# Patient Record
Sex: Male | Born: 1980
Health system: Southern US, Community
[De-identification: ages and names within clinical notes are randomized; demographics above are authoritative.]

## PROBLEM LIST (undated history)

## (undated) DIAGNOSIS — R569 Unspecified convulsions: Secondary | ICD-10-CM

## (undated) DIAGNOSIS — I8501 Esophageal varices with bleeding: Secondary | ICD-10-CM

## (undated) DIAGNOSIS — K7460 Unspecified cirrhosis of liver: Secondary | ICD-10-CM

## (undated) DIAGNOSIS — K219 Gastro-esophageal reflux disease without esophagitis: Secondary | ICD-10-CM

## (undated) DIAGNOSIS — F1010 Alcohol abuse, uncomplicated: Secondary | ICD-10-CM

## (undated) DIAGNOSIS — F32A Depression, unspecified: Secondary | ICD-10-CM

## (undated) DIAGNOSIS — F1910 Other psychoactive substance abuse, uncomplicated: Secondary | ICD-10-CM

## (undated) DIAGNOSIS — F419 Anxiety disorder, unspecified: Secondary | ICD-10-CM

## (undated) DIAGNOSIS — Z5189 Encounter for other specified aftercare: Secondary | ICD-10-CM

## (undated) HISTORY — DX: Anxiety disorder, unspecified: F41.9

## (undated) HISTORY — DX: Encounter for other specified aftercare: Z51.89

## (undated) HISTORY — DX: Other psychoactive substance abuse, uncomplicated: F19.10

## (undated) HISTORY — DX: Unspecified convulsions: R56.9

## (undated) HISTORY — DX: Depression, unspecified: F32.A

---

## 2011-09-16 ENCOUNTER — Other Ambulatory Visit: Payer: Self-pay | Admitting: Gastroenterology

## 2011-09-16 DIAGNOSIS — R1011 Right upper quadrant pain: Secondary | ICD-10-CM

## 2011-09-18 ENCOUNTER — Ambulatory Visit
Admission: RE | Admit: 2011-09-18 | Discharge: 2011-09-18 | Disposition: A | Discharge status: Home / Self Care | Payer: BC Managed Care – PPO | Source: Ambulatory Visit | Attending: Gastroenterology | Admitting: Gastroenterology

## 2011-09-18 DIAGNOSIS — R1011 Right upper quadrant pain: Secondary | ICD-10-CM

## 2011-09-18 MED ORDER — IOHEXOL 300 MG/ML  SOLN
100.0000 mL | Freq: Once | INTRAMUSCULAR | Status: AC | PRN
  Administered 2011-09-18: 100 mL via INTRAVENOUS

## 2012-09-17 ENCOUNTER — Encounter (HOSPITAL_COMMUNITY): Payer: Self-pay | Admitting: Radiology

## 2012-09-17 ENCOUNTER — Emergency Department (HOSPITAL_COMMUNITY)
Admission: EM | Admit: 2012-09-17 | Discharge: 2012-09-17 | Disposition: A | Discharge status: Home / Self Care | Payer: BC Managed Care – PPO | Attending: Emergency Medicine | Admitting: Emergency Medicine

## 2012-09-17 DIAGNOSIS — E876 Hypokalemia: Secondary | ICD-10-CM | POA: Insufficient documentation

## 2012-09-17 DIAGNOSIS — Z79899 Other long term (current) drug therapy: Secondary | ICD-10-CM | POA: Insufficient documentation

## 2012-09-17 DIAGNOSIS — K219 Gastro-esophageal reflux disease without esophagitis: Secondary | ICD-10-CM | POA: Insufficient documentation

## 2012-09-17 DIAGNOSIS — R5381 Other malaise: Secondary | ICD-10-CM | POA: Insufficient documentation

## 2012-09-17 DIAGNOSIS — K769 Liver disease, unspecified: Secondary | ICD-10-CM | POA: Insufficient documentation

## 2012-09-17 DIAGNOSIS — R209 Unspecified disturbances of skin sensation: Secondary | ICD-10-CM | POA: Insufficient documentation

## 2012-09-17 DIAGNOSIS — R110 Nausea: Secondary | ICD-10-CM | POA: Insufficient documentation

## 2012-09-17 HISTORY — DX: Gastro-esophageal reflux disease without esophagitis: K21.9

## 2012-09-17 LAB — COMPREHENSIVE METABOLIC PANEL
AST: 153 U/L — ABNORMAL HIGH (ref 0–37)
CO2: 24 mEq/L (ref 19–32)
Calcium: 6.2 mg/dL — CL (ref 8.4–10.5)
Creatinine, Ser: 0.52 mg/dL (ref 0.50–1.35)
GFR calc Af Amer: >90 mL/min (ref >90)
GFR calc non Af Amer: >90 mL/min (ref >90)
Glucose, Bld: 113 mg/dL — ABNORMAL HIGH (ref 70–99)
Total Protein: 7.5 g/dL (ref 6.0–8.3)

## 2012-09-17 LAB — CBC
HCT: 38.6 % — ABNORMAL LOW (ref 39.0–52.0)
MCHC: 37 g/dL — ABNORMAL HIGH (ref 30.0–36.0)
MCV: 90.2 fL (ref 78.0–100.0)
Platelets: 255 10*3/uL (ref 150–400)
RDW: 18.5 % — ABNORMAL HIGH (ref 11.5–15.5)

## 2012-09-17 LAB — POCT I-STAT, CHEM 8
HCT: 41 % (ref 39.0–52.0)
Hemoglobin: 13.9 g/dL (ref 13.0–17.0)
Potassium: 3.1 mEq/L — ABNORMAL LOW (ref 3.5–5.1)
Sodium: 141 mEq/L (ref 135–145)

## 2012-09-17 LAB — CALCIUM, IONIZED: Calcium, Ion: 0.74 mmol/L — ABNORMAL LOW (ref 1.12–1.23)

## 2012-09-17 MED ORDER — SODIUM CHLORIDE 0.9 % IV BOLUS (SEPSIS)
1000.0000 mL | Freq: Once | INTRAVENOUS | Status: AC
  Administered 2012-09-17: 1000 mL via INTRAVENOUS

## 2012-09-17 MED ORDER — POTASSIUM CHLORIDE CRYS ER 20 MEQ PO TBCR
40.0000 meq | EXTENDED_RELEASE_TABLET | Freq: Once | ORAL | Status: AC
  Administered 2012-09-17: 40 meq via ORAL
  Filled 2012-09-17: qty 2

## 2012-09-17 MED ORDER — MAGNESIUM SULFATE 40 MG/ML IJ SOLN
2.0000 g | INTRAMUSCULAR | Status: AC
  Administered 2012-09-17: 2 g via INTRAVENOUS
  Filled 2012-09-17: qty 50

## 2012-09-17 MED ORDER — SODIUM CHLORIDE 0.9 % IV SOLN
1.0000 g | Freq: Once | INTRAVENOUS | Status: AC
  Administered 2012-09-17: 1 g via INTRAVENOUS
  Filled 2012-09-17: qty 10

## 2012-09-17 NOTE — ED Notes (Signed)
Main lab called with critical magnesium 0.4. At this time. ED MD made aware

## 2012-09-17 NOTE — ED Notes (Signed)
Abnormal lab value called by lab. Calcium 6.2. Ed md made aware

## 2012-09-17 NOTE — ED Provider Notes (Signed)
History     CSN: [id number]  Arrival date & time 09/17/12  4098   First MD Initiated Contact with Patient 09/17/12 [phone]      Chief Complaint  Patient presents with  . Muscle Pain  . Abnormal Lab    HPI  Patient presents with concerns of muscle cramps, weakness. Symptoms began approximately one week ago, initially intermittent.  Since onset and hasn't become more persistent, though remained intermittent.  There is diffuse also weakness with crampiness of his upper and lower extremities. There is occasional.  Oral dysesthesia. With the progression of weakness the patient saw his physician yesterday. Labs were drawn, and today results are notable for abnormal calcium value. He was referred here for further evaluation. He denies any fever, chills, vomiting, diarrhea, incontinence, syncope throughout the episode. No new medication use. He takes lisinopril for blood pressure. He has a notable history of prior alcohol abuse, abnormal liver function.   Past Medical History  Diagnosis Date  . GERD (gastroesophageal reflux disease)     History reviewed. No pertinent past surgical history.  History reviewed. No pertinent family history.  History  Substance Use Topics  . Smoking status: Not on file  . Smokeless tobacco: Not on file  . Alcohol Use: Not on file      Review of Systems  Constitutional:       Per HPI, otherwise negative  HENT:       Per HPI, otherwise negative  Respiratory:       Per HPI, otherwise negative  Cardiovascular:       Per HPI, otherwise negative  Gastrointestinal: Positive for nausea. Negative for vomiting.  Endocrine:       Negative aside from HPI  Genitourinary:       Neg aside from HPI   Musculoskeletal:       Per HPI, otherwise negative  Skin: Negative.   Neurological: Positive for weakness and numbness. Negative for dizziness, syncope, light-headedness and headaches.    Allergies  Soy allergy  Home Medications   Current  Outpatient Rx  Name  Route  Sig  Dispense  Refill  . Multiple Vitamin (MULTIVITAMIN WITH MINERALS) TABS   Oral   Take 1 tablet by mouth daily.         Marland Kitchen omeprazole (PRILOSEC) 20 MG capsule   Oral   Take 20 mg by mouth daily.           BP 107/79  Pulse 107  Temp(Src) 98.8 F (37.1 C) (Oral)  Resp 20  SpO2 100%  Physical Exam  Nursing note and vitals reviewed. Constitutional: He is oriented to person, place, and time. He appears well-developed. No distress.  HENT:  Head: Normocephalic and atraumatic.  Neg [place]  Eyes: Conjunctivae and EOM are normal.  Cardiovascular: Normal rate and regular rhythm.   Pulmonary/Chest: Effort normal. No stridor. No respiratory distress.  Abdominal: He exhibits no distension.  Musculoskeletal: He exhibits no edema.  Strength in upper and lower extremities, proximally and distally is 5/5, the patient describes a subjective weakness. No gross tetany  Neurological: He is alert and oriented to person, place, and time.  Skin: Skin is warm and dry.  Psychiatric: He has a normal mood and affect.    ED Course  Procedures (including critical care time)  Labs Reviewed  CALCIUM, IONIZED  CBC  COMPREHENSIVE METABOLIC PANEL  MAGNESIUM   No results found.   No diagnosis found.  O2- 99%ra, normal   Date: 09/17/2012  Rate: 105  Rhythm: sinus tachycardia  QRS Axis: normal  Intervals: normal  ST/T Wave abnormalities: nonspecific T wave changes - some biphasic, some inverted  Conduction Disutrbances:none  Narrative Interpretation:   Old EKG Reviewed: none available  ABNORMAL   Initial labs notable for hypomagnesemia.  Supplementation ordered.  Secondary labs demonstrate hypocalcemia as well.  Ionized calcium is pending, but the patient received additional calcium supplement.   11:41 AM Patient subjectively better, stating that he feels notable improvement. On my reexam he appears more comfortable. We discussed his abnormal  labs, including his hyperbilirubinemia.  With his documented hepatic dysfunction we discussed follow up as an outpatient for this abnormality.  MDM  This young male with known hepatic dysfunction presents with new weakness.  No gross evidence of tetany, but with notable abnormal labs.  Following resuscitation with fluids, left leg supplementation, he was substantially improved.  Clear etiology for his electrolyte deficiencies not apparent, but with his improvement, has stable vital signs, is appropriate for discharge with return precautions, and instructions to have repeat labs done in 4 days as well as to discuss his ongoing hepatic dysfunction with his primary care physician.        Gerhard Munch, MD 09/17/12 [phone]

## 2012-09-17 NOTE — ED Notes (Signed)
Pt presents with abnormal lab values per PMD of calcium. Pt reports muscles cramps in all 4 extremities X 1 week.

## 2012-09-17 NOTE — ED Notes (Signed)
MD at bedside. 

## 2012-09-23 ENCOUNTER — Other Ambulatory Visit: Payer: Self-pay

## 2012-09-23 DIAGNOSIS — R748 Abnormal levels of other serum enzymes: Secondary | ICD-10-CM

## 2012-09-28 ENCOUNTER — Other Ambulatory Visit: Payer: BC Managed Care – PPO

## 2015-08-20 ENCOUNTER — Encounter (HOSPITAL_COMMUNITY): Payer: Self-pay | Admitting: Emergency Medicine

## 2015-08-20 ENCOUNTER — Inpatient Hospital Stay (HOSPITAL_COMMUNITY)
Admission: EM | Admit: 2015-08-20 | Discharge: 2015-09-08 | DRG: 368 | Disposition: A | Discharge status: Home Health | Payer: Medicaid Other | Attending: Internal Medicine | Admitting: Internal Medicine

## 2015-08-20 DIAGNOSIS — E870 Hyperosmolality and hypernatremia: Secondary | ICD-10-CM | POA: Diagnosis present

## 2015-08-20 DIAGNOSIS — E875 Hyperkalemia: Secondary | ICD-10-CM | POA: Diagnosis not present

## 2015-08-20 DIAGNOSIS — E861 Hypovolemia: Secondary | ICD-10-CM | POA: Diagnosis present

## 2015-08-20 DIAGNOSIS — D6959 Other secondary thrombocytopenia: Secondary | ICD-10-CM | POA: Diagnosis present

## 2015-08-20 DIAGNOSIS — F17210 Nicotine dependence, cigarettes, uncomplicated: Secondary | ICD-10-CM | POA: Diagnosis present

## 2015-08-20 DIAGNOSIS — G9340 Encephalopathy, unspecified: Secondary | ICD-10-CM

## 2015-08-20 DIAGNOSIS — E872 Acidosis, unspecified: Secondary | ICD-10-CM

## 2015-08-20 DIAGNOSIS — K922 Gastrointestinal hemorrhage, unspecified: Secondary | ICD-10-CM | POA: Diagnosis not present

## 2015-08-20 DIAGNOSIS — R339 Retention of urine, unspecified: Secondary | ICD-10-CM | POA: Diagnosis present

## 2015-08-20 DIAGNOSIS — E2740 Unspecified adrenocortical insufficiency: Secondary | ICD-10-CM | POA: Diagnosis present

## 2015-08-20 DIAGNOSIS — K7031 Alcoholic cirrhosis of liver with ascites: Secondary | ICD-10-CM | POA: Diagnosis present

## 2015-08-20 DIAGNOSIS — R791 Abnormal coagulation profile: Secondary | ICD-10-CM | POA: Diagnosis not present

## 2015-08-20 DIAGNOSIS — Z515 Encounter for palliative care: Secondary | ICD-10-CM | POA: Diagnosis not present

## 2015-08-20 DIAGNOSIS — J9600 Acute respiratory failure, unspecified whether with hypoxia or hypercapnia: Secondary | ICD-10-CM | POA: Diagnosis not present

## 2015-08-20 DIAGNOSIS — I8501 Esophageal varices with bleeding: Secondary | ICD-10-CM | POA: Diagnosis present

## 2015-08-20 DIAGNOSIS — F1010 Alcohol abuse, uncomplicated: Secondary | ICD-10-CM | POA: Diagnosis not present

## 2015-08-20 DIAGNOSIS — Z781 Physical restraint status: Secondary | ICD-10-CM | POA: Diagnosis not present

## 2015-08-20 DIAGNOSIS — R578 Other shock: Secondary | ICD-10-CM | POA: Insufficient documentation

## 2015-08-20 DIAGNOSIS — Z7189 Other specified counseling: Secondary | ICD-10-CM | POA: Diagnosis not present

## 2015-08-20 DIAGNOSIS — E46 Unspecified protein-calorie malnutrition: Secondary | ICD-10-CM | POA: Diagnosis present

## 2015-08-20 DIAGNOSIS — R188 Other ascites: Secondary | ICD-10-CM

## 2015-08-20 DIAGNOSIS — K652 Spontaneous bacterial peritonitis: Secondary | ICD-10-CM | POA: Diagnosis not present

## 2015-08-20 DIAGNOSIS — D689 Coagulation defect, unspecified: Secondary | ICD-10-CM | POA: Diagnosis present

## 2015-08-20 DIAGNOSIS — K766 Portal hypertension: Secondary | ICD-10-CM | POA: Diagnosis present

## 2015-08-20 DIAGNOSIS — D62 Acute posthemorrhagic anemia: Secondary | ICD-10-CM | POA: Diagnosis present

## 2015-08-20 DIAGNOSIS — F10239 Alcohol dependence with withdrawal, unspecified: Secondary | ICD-10-CM | POA: Diagnosis present

## 2015-08-20 DIAGNOSIS — Z66 Do not resuscitate: Secondary | ICD-10-CM | POA: Diagnosis present

## 2015-08-20 DIAGNOSIS — J9601 Acute respiratory failure with hypoxia: Secondary | ICD-10-CM | POA: Insufficient documentation

## 2015-08-20 DIAGNOSIS — R451 Restlessness and agitation: Secondary | ICD-10-CM | POA: Diagnosis not present

## 2015-08-20 DIAGNOSIS — K920 Hematemesis: Secondary | ICD-10-CM | POA: Diagnosis present

## 2015-08-20 DIAGNOSIS — Z4659 Encounter for fitting and adjustment of other gastrointestinal appliance and device: Secondary | ICD-10-CM

## 2015-08-20 DIAGNOSIS — E876 Hypokalemia: Secondary | ICD-10-CM | POA: Diagnosis present

## 2015-08-20 DIAGNOSIS — K7040 Alcoholic hepatic failure without coma: Secondary | ICD-10-CM | POA: Diagnosis present

## 2015-08-20 DIAGNOSIS — R140 Abdominal distension (gaseous): Secondary | ICD-10-CM

## 2015-08-20 DIAGNOSIS — D72829 Elevated white blood cell count, unspecified: Secondary | ICD-10-CM | POA: Diagnosis not present

## 2015-08-20 DIAGNOSIS — N179 Acute kidney failure, unspecified: Secondary | ICD-10-CM | POA: Diagnosis not present

## 2015-08-20 DIAGNOSIS — K567 Ileus, unspecified: Secondary | ICD-10-CM

## 2015-08-20 DIAGNOSIS — Z6827 Body mass index (BMI) 27.0-27.9, adult: Secondary | ICD-10-CM

## 2015-08-20 DIAGNOSIS — E8770 Fluid overload, unspecified: Secondary | ICD-10-CM | POA: Diagnosis not present

## 2015-08-20 DIAGNOSIS — K7030 Alcoholic cirrhosis of liver without ascites: Secondary | ICD-10-CM | POA: Insufficient documentation

## 2015-08-20 DIAGNOSIS — K219 Gastro-esophageal reflux disease without esophagitis: Secondary | ICD-10-CM | POA: Diagnosis present

## 2015-08-20 DIAGNOSIS — E8809 Other disorders of plasma-protein metabolism, not elsewhere classified: Secondary | ICD-10-CM | POA: Diagnosis present

## 2015-08-20 DIAGNOSIS — Z0189 Encounter for other specified special examinations: Secondary | ICD-10-CM

## 2015-08-20 DIAGNOSIS — R9431 Abnormal electrocardiogram [ECG] [EKG]: Secondary | ICD-10-CM | POA: Diagnosis not present

## 2015-08-20 DIAGNOSIS — Z8249 Family history of ischemic heart disease and other diseases of the circulatory system: Secondary | ICD-10-CM | POA: Diagnosis not present

## 2015-08-20 DIAGNOSIS — J9690 Respiratory failure, unspecified, unspecified whether with hypoxia or hypercapnia: Secondary | ICD-10-CM

## 2015-08-20 HISTORY — DX: Alcohol abuse, uncomplicated: F10.10

## 2015-08-20 LAB — COMPREHENSIVE METABOLIC PANEL
ALT: 24 U/L (ref 17–63)
AST: 73 U/L — ABNORMAL HIGH (ref 15–41)
Albumin: 2.2 g/dL — ABNORMAL LOW (ref 3.5–5.0)
Alkaline Phosphatase: 86 U/L (ref 38–126)
Anion gap: 7 (ref 5–15)
BUN: <5 mg/dL — ABNORMAL LOW (ref 6–20)
CHLORIDE: 106 mmol/L (ref 101–111)
CO2: 22 mmol/L (ref 22–32)
Calcium: 6.5 mg/dL — ABNORMAL LOW (ref 8.9–10.3)
Creatinine, Ser: 0.72 mg/dL (ref 0.61–1.24)
Glucose, Bld: 111 mg/dL — ABNORMAL HIGH (ref 65–99)
POTASSIUM: 2.7 mmol/L — AB (ref 3.5–5.1)
Sodium: 135 mmol/L (ref 135–145)
Total Bilirubin: 1.4 mg/dL — ABNORMAL HIGH (ref 0.3–1.2)
Total Protein: 6.7 g/dL (ref 6.5–8.1)

## 2015-08-20 LAB — CBC WITH DIFFERENTIAL/PLATELET
BASOS ABS: 0.1 10*3/uL (ref 0.0–0.1)
BASOS PCT: 1 %
Eosinophils Absolute: 0.1 10*3/uL (ref 0.0–0.7)
Eosinophils Relative: 1 %
HEMATOCRIT: 23.3 % — AB (ref 39.0–52.0)
Hemoglobin: 6.7 g/dL — CL (ref 13.0–17.0)
LYMPHS ABS: 1 10*3/uL (ref 0.7–4.0)
Lymphocytes Relative: 11 %
MCH: 20 pg — AB (ref 26.0–34.0)
MCHC: 28.8 g/dL — AB (ref 30.0–36.0)
MCV: 69.6 fL — ABNORMAL LOW (ref 78.0–100.0)
MONOS PCT: 16 %
Monocytes Absolute: 1.5 10*3/uL — ABNORMAL HIGH (ref 0.1–1.0)
NEUTROS ABS: 6.8 10*3/uL (ref 1.7–7.7)
Neutrophils Relative %: 71 %
Platelets: 206 10*3/uL (ref 150–400)
RBC: 3.35 MIL/uL — ABNORMAL LOW (ref 4.22–5.81)
RDW: 20.4 % — AB (ref 11.5–15.5)
WBC: 9.5 10*3/uL (ref 4.0–10.5)

## 2015-08-20 LAB — POC OCCULT BLOOD, ED: FECAL OCCULT BLD: POSITIVE — AB

## 2015-08-20 LAB — PREPARE RBC (CROSSMATCH)

## 2015-08-20 LAB — PROTIME-INR
INR: 1.56 — AB (ref 0.00–1.49)
PROTHROMBIN TIME: 18.7 s — AB (ref 11.6–15.2)

## 2015-08-20 LAB — I-STAT CG4 LACTIC ACID, ED: Lactic Acid, Venous: 4.82 mmol/L (ref 0.5–2.0)

## 2015-08-20 LAB — ETHANOL: Alcohol, Ethyl (B): 50 mg/dL — ABNORMAL HIGH (ref <5)

## 2015-08-20 LAB — APTT: aPTT: 29 seconds (ref 24–37)

## 2015-08-20 LAB — ABO/RH: ABO/RH(D): O NEG

## 2015-08-20 MED ORDER — SODIUM CHLORIDE 0.9 % IV BOLUS (SEPSIS)
1000.0000 mL | Freq: Once | INTRAVENOUS | Status: AC
  Administered 2015-08-20: 1000 mL via INTRAVENOUS

## 2015-08-20 MED ORDER — POTASSIUM CHLORIDE 10 MEQ/100ML IV SOLN
10.0000 meq | INTRAVENOUS | Status: AC
  Administered 2015-08-20 – 2015-08-21 (×2): 10 meq via INTRAVENOUS
  Filled 2015-08-20 (×2): qty 100

## 2015-08-20 MED ORDER — OCTREOTIDE LOAD VIA INFUSION
50.0000 ug | Freq: Once | INTRAVENOUS | Status: AC
  Administered 2015-08-20: 50 ug via INTRAVENOUS
  Filled 2015-08-20: qty 25

## 2015-08-20 MED ORDER — SODIUM CHLORIDE 0.9 % IV SOLN
Freq: Once | INTRAVENOUS | Status: AC
  Administered 2015-08-21: 02:00:00 via INTRAVENOUS

## 2015-08-20 MED ORDER — LACTATED RINGERS IV BOLUS (SEPSIS): 1000.0000 mL | Freq: Once | INTRAVENOUS | Status: DC

## 2015-08-20 MED ORDER — LACTATED RINGERS IV BOLUS (SEPSIS)
1000.0000 mL | Freq: Once | INTRAVENOUS | Status: AC
  Administered 2015-08-20: 1000 mL via INTRAVENOUS

## 2015-08-20 MED ORDER — SODIUM CHLORIDE 0.9 % IV SOLN
50.0000 ug/h | INTRAVENOUS | Status: DC
  Administered 2015-08-20 – 2015-08-23 (×6): 50 ug/h via INTRAVENOUS
  Filled 2015-08-20 (×18): qty 1

## 2015-08-20 MED ORDER — DEXTROSE 5 % IV SOLN
1.0000 g | Freq: Once | INTRAVENOUS | Status: AC
  Administered 2015-08-20: 1 g via INTRAVENOUS
  Filled 2015-08-20: qty 10

## 2015-08-20 MED ORDER — SODIUM CHLORIDE 0.9 % IV SOLN: 10.0000 mL/h | Freq: Once | INTRAVENOUS | Status: DC

## 2015-08-20 MED ORDER — PANTOPRAZOLE SODIUM 40 MG IV SOLR
40.0000 mg | Freq: Once | INTRAVENOUS | Status: AC
  Administered 2015-08-20: 40 mg via INTRAVENOUS
  Filled 2015-08-20: qty 40

## 2015-08-20 NOTE — Consult Note (Signed)
[facility] Consult Note  Referring Provider: No ref. provider found Primary Care Physician:  No primary care provider on file. Primary Gastroenterologist:  Dr.  Chief Complaint: Hematemesis HPI: [name] is an 35 y.o. white male  who presented with the abrupt onset of  hematemesis this evening with orthostatic dizziness and also noticed a bloody stool. Had a previous normal bowel movement 2 hours earlier. He denies any pain. He is alert and oriented and has not vomited since reaching the emergency room. Hemoglobin 6.7 lactic acid 4.8 with an INR of 1.9. Patient has history of fatty liver disease and alcohol use with no known cirrhosis. He had an EGD showing a small hiatal hernia in 2013. He states he empirically takes nonsteroidal anti-inflammatory drugs and does drink alcohol daily.  Past Medical History  Diagnosis Date  . GERD (gastroesophageal reflux disease)   . ETOH abuse     History reviewed. No pertinent past surgical history.   (Not in a hospital admission)  Allergies:  Allergies  Allergen Reactions  . Soy Allergy Other (See Comments)    Swelling.     History reviewed. No pertinent family history.  Social History:  reports that he has been smoking Cigarettes.  He has been smoking about 1.00 pack per day. He has never used smokeless tobacco. He reports that he drinks alcohol. He reports that he does not use illicit drugs.  Review of Systems: negative except As above   Blood pressure 133/85, pulse 138, temperature 98.9 F (37.2 C), temperature source Oral, resp. rate 16, height 5' 7" (1.702 m), weight 58.968 kg (130 lb), SpO2 100 %. Head: Normocephalic, without obvious abnormality, atraumatic Neck: no adenopathy, no carotid bruit, no JVD, supple, symmetrical, trachea midline and thyroid not enlarged, symmetric, no tenderness/mass/nodules Resp: clear to auscultation bilaterally Cardio: regular rate and rhythm, S1, S2 normal, no murmur, click, rub or  gallop GI: Abdomen soft nondistended with normoactive bowel sounds. No hepatosplenomegaly mass or guarding. Extremities: extremities normal, atraumatic, no cyanosis or edema  Results for orders placed or performed during the hospital encounter of 08/20/15 (from the past 48 hour(s))  POC occult blood, ED     Status: Abnormal   Collection Time: 08/20/15  9:37 PM  Result Value Ref Range   Fecal Occult Bld POSITIVE (A) NEGATIVE  Type and screen [facility]     Status: None (Preliminary result)   Collection Time: 08/20/15  9:42 PM  Result Value Ref Range   ABO/RH(D) O NEG    Antibody Screen PENDING    Sample Expiration 08/23/2015   Comprehensive metabolic panel     Status: Abnormal   Collection Time: 08/20/15  9:44 PM  Result Value Ref Range   Sodium 135 135 - 145 mmol/L   Potassium 2.7 (LL) 3.5 - 5.1 mmol/L    Comment: CRITICAL RESULT CALLED TO, READ BACK BY AND VERIFIED WITH: A.LEDWELL,RN AT 2220 ON 08/20/15 BY W.SHEA    Chloride 106 101 - 111 mmol/L   CO2 22 22 - 32 mmol/L   Glucose, Bld 111 (H) 65 - 99 mg/dL   BUN <5 (L) 6 - 20 mg/dL   Creatinine, Ser 0.72 0.61 - 1.24 mg/dL   Calcium 6.5 (L) 8.9 - 10.3 mg/dL   Total Protein 6.7 6.5 - 8.1 g/dL   Albumin 2.2 (L) 3.5 - 5.0 g/dL   AST 73 (H) 15 - 41 U/L   ALT 24 17 - 63 U/L   Alkaline Phosphatase 86 38 -   126 U/L   Total Bilirubin 1.4 (H) 0.3 - 1.2 mg/dL   GFR calc non Af Amer >60 >60 mL/min   GFR calc Af Amer >60 >60 mL/min    Comment: (NOTE) The eGFR has been calculated using the CKD EPI equation. This calculation has not been validated in all clinical situations. eGFR's persistently <60 mL/min signify possible Chronic Kidney Disease.    Anion gap 7 5 - 15  CBC with Differential/Platelet     Status: Abnormal   Collection Time: 08/20/15  9:44 PM  Result Value Ref Range   WBC 9.5 4.0 - 10.5 K/uL   RBC 3.35 (L) 4.22 - 5.81 MIL/uL   Hemoglobin 6.7 (LL) 13.0 - 17.0 g/dL    Comment: CRITICAL RESULT CALLED TO,  READ BACK BY AND VERIFIED WITH: INFINGER,K RN [id number] COV[place],N    HCT 23.3 (L) 39.0 - 52.0 %   MCV 69.6 (L) 78.0 - 100.0 fL   MCH 20.0 (L) 26.0 - 34.0 pg   MCHC 28.8 (L) 30.0 - 36.0 g/dL   RDW 20.4 (H) 11.5 - 15.5 %   Platelets 206 150 - 400 K/uL   Neutrophils Relative % 71 %   Lymphocytes Relative 11 %   Monocytes Relative 16 %   Eosinophils Relative 1 %   Basophils Relative 1 %   Neutro Abs 6.8 1.7 - 7.7 K/uL   Lymphs Abs 1.0 0.7 - 4.0 K/uL   Monocytes Absolute 1.5 (H) 0.1 - 1.0 K/uL   Eosinophils Absolute 0.1 0.0 - 0.7 K/uL   Basophils Absolute 0.1 0.0 - 0.1 K/uL   RBC Morphology POLYCHROMASIA PRESENT   APTT     Status: None   Collection Time: 08/20/15  9:44 PM  Result Value Ref Range   aPTT 29 24 - 37 seconds  Protime-INR     Status: Abnormal   Collection Time: 08/20/15  9:44 PM  Result Value Ref Range   Prothrombin Time 18.7 (H) 11.6 - 15.2 seconds   INR 1.56 (H) 0.00 - 1.49  I-Stat CG4 Lactic Acid, ED     Status: Abnormal   Collection Time: 08/20/15 10:07 PM  Result Value Ref Range   Lactic Acid, Venous 4.82 (HH) 0.5 - 2.0 mmol/L   Comment NOTIFIED PHYSICIAN   Ethanol     Status: Abnormal   Collection Time: 08/20/15 10:21 PM  Result Value Ref Range   Alcohol, Ethyl (B) 50 (H) <5 mg/dL    Comment:        LOWEST DETECTABLE LIMIT FOR SERUM ALCOHOL IS 5 mg/dL FOR MEDICAL PURPOSES ONLY    No results found.  Assessment: Hematemesis with melena in a patient with history of alcohol use. Plan:  IV PPI Transfuse PRBCs IV octreotide EGD early in the morning. Latreece Mochizuki C 08/20/2015, 11:20 PM  Pager [phone] If no answer or after 5 PM call [phone]   

## 2015-08-20 NOTE — ED Notes (Signed)
Pt presents via EMS from home c/o 2 episodes bright red emesis and one bright red bloody stool since 845pm tonight.  Prior hx ETOH abuse, 1 Four Loco ingested tonight. No other medical history. No pain.

## 2015-08-20 NOTE — ED Notes (Signed)
Critical lactic acid given to Dr.Knott

## 2015-08-20 NOTE — ED Provider Notes (Signed)
CSN: [id number]     Arrival date & time 08/20/15  2120 History   First MD Initiated Contact with Patient 08/20/15 2144     Chief Complaint  Patient presents with  . Hematemesis     (Consider location/radiation/quality/duration/timing/severity/associated sxs/prior Treatment) Patient is a 35 y.o. male presenting with GI illness. The history is provided by the patient.  GI Problem This is a new problem. The current episode started 1 to 2 hours ago. The problem occurs constantly. The problem has been rapidly improving. Pertinent negatives include no chest pain, no abdominal pain and no shortness of breath. Nothing aggravates the symptoms. He has tried nothing for the symptoms.    Past Medical History  Diagnosis Date  . GERD (gastroesophageal reflux disease)   . ETOH abuse    History reviewed. No pertinent past surgical history. History reviewed. No pertinent family history. Social History  Substance Use Topics  . Smoking status: Heavy Tobacco Smoker -- 1.00 packs/day    Types: Cigarettes  . Smokeless tobacco: Never Used  . Alcohol Use: Yes     Comment: 2 Four loco per day    Review of Systems  Respiratory: Negative for shortness of breath.   Cardiovascular: Negative for chest pain.  Gastrointestinal: Positive for vomiting and blood in stool. Negative for abdominal pain.       Hematemesis large volume x2  Neurological: Positive for dizziness and light-headedness.  All other systems reviewed and are negative.     Allergies  Soy allergy  Home Medications   Prior to Admission medications   Medication Sig Start Date End Date Taking? Authorizing Provider  omeprazole (PRILOSEC) 20 MG capsule Take 20 mg by mouth daily.   Yes Historical Provider, MD   BP 133/85 mmHg  Pulse 138  Temp(Src) 98.9 F (37.2 C) (Oral)  Resp 16  Ht 5\' 7"  (1.702 m)  Wt 130 lb (58.968 kg)  BMI 20.36 kg/m2  SpO2 100% Physical Exam  Constitutional: He is oriented to person, place, and time. He  appears well-developed. He appears listless. He appears toxic. No distress.  HENT:  Head: Normocephalic and atraumatic.  Eyes: Conjunctivae are normal.  Neck: Neck supple. No tracheal deviation present.  Cardiovascular: Regular rhythm and normal heart sounds.  Tachycardia present.   Pulmonary/Chest: Effort normal and breath sounds normal. No respiratory distress.  Abdominal: Soft. He exhibits no distension.  Neurological: He is oriented to person, place, and time. He appears listless.  Skin: Skin is warm and dry. There is pallor.  Psychiatric: He has a normal mood and affect.    ED Course  Procedures (including critical care time)  CRITICAL CARE Performed by: Lyndal PulleyKnott, Jobe Total critical care time: 30 minutes Critical care time was exclusive of separately billable procedures and treating other patients. Critical care was necessary to treat or prevent imminent or life-threatening deterioration. Critical care was time spent personally by me on the following activities: development of treatment plan with patient and/or surrogate as well as nursing, discussions with consultants, evaluation of patient's response to treatment, examination of patient, obtaining history from patient or surrogate, ordering and performing treatments and interventions, ordering and review of laboratory studies, ordering and review of radiographic studies, pulse oximetry and re-evaluation of patient's condition.   Labs Review Labs Reviewed  COMPREHENSIVE METABOLIC PANEL - Abnormal; Notable for the following:    Potassium 2.7 (*)    Glucose, Bld 111 (*)    BUN <5 (*)    Calcium 6.5 (*)    Albumin  2.2 (*)    AST 73 (*)    Total Bilirubin 1.4 (*)    All other components within normal limits  CBC WITH DIFFERENTIAL/PLATELET - Abnormal; Notable for the following:    RBC 3.35 (*)    Hemoglobin 6.7 (*)    HCT 23.3 (*)    MCV 69.6 (*)    MCH 20.0 (*)    MCHC 28.8 (*)    RDW 20.4 (*)    Monocytes Absolute 1.5  (*)    All other components within normal limits  ETHANOL - Abnormal; Notable for the following:    Alcohol, Ethyl (B) 50 (*)    All other components within normal limits  PROTIME-INR - Abnormal; Notable for the following:    Prothrombin Time 18.7 (*)    INR 1.56 (*)    All other components within normal limits  MAGNESIUM - Abnormal; Notable for the following:    Magnesium 0.4 (*)    All other components within normal limits  LACTIC ACID, PLASMA - Abnormal; Notable for the following:    Lactic Acid, Venous 2.3 (*)    All other components within normal limits  POC OCCULT BLOOD, ED - Abnormal; Notable for the following:    Fecal Occult Bld POSITIVE (*)    All other components within normal limits  I-STAT CG4 LACTIC ACID, ED - Abnormal; Notable for the following:    Lactic Acid, Venous 4.82 (*)    All other components within normal limits  CULTURE, BLOOD (ROUTINE X 2)  CULTURE, BLOOD (ROUTINE X 2)  MRSA PCR SCREENING  APTT  PHOSPHORUS  COMPREHENSIVE METABOLIC PANEL  LACTIC ACID, PLASMA  TYPE AND SCREEN  PREPARE RBC (CROSSMATCH)  ABO/RH    Imaging Review No results found. I have personally reviewed and evaluated these images and lab results as part of my medical decision-making.   EKG Interpretation   Date/Time:  Monday August 20 2015 21:29:52 EDT Ventricular Rate:  132 PR Interval:  124 QRS Duration: 77 QT Interval:  315 QTC Calculation: 467 R Axis:   -9 Text Interpretation:  Sinus tachycardia Borderline repolarization  abnormality No previous tracing Confirmed by Isidora Laham MD, [place] ([id number]) on  08/20/2015 11:13:43 PM      MDM   Final diagnoses:  Acute upper GI bleed  Hemorrhagic shock  Lactic acidosis  Acute blood loss anemia    35 y.o. male presents with acute GI bleeding episodes with bright red hematemesis tonight x2. This was followed by a bright red bowel movement and the patient became faint and lightheaded. H/o alcohol abuse and "fatty liver". Had one  episode of hematemesis previously remotely that resolved and never sought care for this. No known varices but highly suspicious for this with rapid transit upper GI bleed and clinical history. Pt is tachycardic and pale appearing, no bleeding episodes in ED. Fluid resuscitated pending labs and started on protonix and octreotide with rocephin for prophylaxis of translocation. Hb is low, will transfuse 2u PRBC in anticipation of potential further bleeding. mentating and maintaining airway appropriately. Lactic acidosis likely 2/2 acute bleeding episode. 2 runs of IV K for hypokalemia. GI consulted and agreed to see Pt in ED for emergent endoscopy. Hospitalist was consulted for stepdown admission and will see the patient in the emergency department.     Lyndal Pulley, MD 08/21/15 [phone]

## 2015-08-21 ENCOUNTER — Inpatient Hospital Stay (HOSPITAL_COMMUNITY): Payer: Medicaid Other

## 2015-08-21 ENCOUNTER — Inpatient Hospital Stay (HOSPITAL_COMMUNITY): Payer: Medicaid Other | Admitting: Anesthesiology

## 2015-08-21 ENCOUNTER — Encounter (HOSPITAL_COMMUNITY)
Admission: EM | Disposition: A | Discharge status: Home Health | Payer: Self-pay | Source: Home / Self Care | Attending: Emergency Medicine

## 2015-08-21 ENCOUNTER — Encounter (HOSPITAL_COMMUNITY): Payer: Self-pay | Admitting: *Deleted

## 2015-08-21 DIAGNOSIS — K922 Gastrointestinal hemorrhage, unspecified: Secondary | ICD-10-CM

## 2015-08-21 DIAGNOSIS — J9600 Acute respiratory failure, unspecified whether with hypoxia or hypercapnia: Secondary | ICD-10-CM

## 2015-08-21 DIAGNOSIS — F1010 Alcohol abuse, uncomplicated: Secondary | ICD-10-CM

## 2015-08-21 DIAGNOSIS — D62 Acute posthemorrhagic anemia: Secondary | ICD-10-CM

## 2015-08-21 DIAGNOSIS — R791 Abnormal coagulation profile: Secondary | ICD-10-CM

## 2015-08-21 HISTORY — PX: ESOPHAGOGASTRODUODENOSCOPY: SHX5428

## 2015-08-21 LAB — CBC
HCT: 24.2 % — ABNORMAL LOW (ref 39.0–52.0)
HCT: 24.3 % — ABNORMAL LOW (ref 39.0–52.0)
HEMATOCRIT: 27.9 % — AB (ref 39.0–52.0)
HEMATOCRIT: 24.9 % — AB (ref 39.0–52.0)
HEMOGLOBIN: 8 g/dL — AB (ref 13.0–17.0)
Hemoglobin: 7.9 g/dL — ABNORMAL LOW (ref 13.0–17.0)
Hemoglobin: 7.9 g/dL — ABNORMAL LOW (ref 13.0–17.0)
Hemoglobin: 8.8 g/dL — ABNORMAL LOW (ref 13.0–17.0)
MCH: 24.4 pg — AB (ref 26.0–34.0)
MCH: 24.7 pg — AB (ref 26.0–34.0)
MCH: 25.2 pg — AB (ref 26.0–34.0)
MCH: 25.6 pg — ABNORMAL LOW (ref 26.0–34.0)
MCHC: 32.1 g/dL (ref 30.0–36.0)
MCHC: 32.5 g/dL (ref 30.0–36.0)
MCHC: 32.6 g/dL (ref 30.0–36.0)
MCHC: 31.5 g/dL (ref 30.0–36.0)
MCV: 76.9 fL — ABNORMAL LOW (ref 78.0–100.0)
MCV: 77.1 fL — AB (ref 78.0–100.0)
MCV: 77.3 fL — AB (ref 78.0–100.0)
MCV: 78.9 fL (ref 78.0–100.0)
PLATELETS: 323 10*3/uL (ref 150–400)
PLATELETS: 330 10*3/uL (ref 150–400)
PLATELETS: 378 10*3/uL (ref 150–400)
Platelets: 295 10*3/uL (ref 150–400)
RBC: 3.08 MIL/uL — ABNORMAL LOW (ref 4.22–5.81)
RBC: 3.14 MIL/uL — ABNORMAL LOW (ref 4.22–5.81)
RBC: 3.24 MIL/uL — ABNORMAL LOW (ref 4.22–5.81)
RBC: 3.61 MIL/uL — AB (ref 4.22–5.81)
RDW: 19.2 % — AB (ref 11.5–15.5)
RDW: 19.2 % — AB (ref 11.5–15.5)
RDW: 20 % — AB (ref 11.5–15.5)
RDW: 19.3 % — ABNORMAL HIGH (ref 11.5–15.5)
WBC: 20.8 10*3/uL — AB (ref 4.0–10.5)
WBC: 39.6 10*3/uL — AB (ref 4.0–10.5)
WBC: 19 10*3/uL — ABNORMAL HIGH (ref 4.0–10.5)
WBC: 28.5 10*3/uL — ABNORMAL HIGH (ref 4.0–10.5)

## 2015-08-21 LAB — LACTIC ACID, PLASMA
LACTIC ACID, VENOUS: 2.3 mmol/L — AB (ref 0.5–2.0)
Lactic Acid, Venous: 2.2 mmol/L (ref 0.5–2.0)

## 2015-08-21 LAB — MAGNESIUM
MAGNESIUM: 0.4 mg/dL — AB (ref 1.7–2.4)
MAGNESIUM: 1.6 mg/dL — AB (ref 1.7–2.4)

## 2015-08-21 LAB — COMPREHENSIVE METABOLIC PANEL
ALT: 18 U/L (ref 17–63)
ANION GAP: 9 (ref 5–15)
AST: 49 U/L — AB (ref 15–41)
Albumin: 1.8 g/dL — ABNORMAL LOW (ref 3.5–5.0)
Alkaline Phosphatase: 64 U/L (ref 38–126)
BILIRUBIN TOTAL: 2 mg/dL — AB (ref 0.3–1.2)
CALCIUM: 6.3 mg/dL — AB (ref 8.9–10.3)
CO2: 21 mmol/L — ABNORMAL LOW (ref 22–32)
Chloride: 110 mmol/L (ref 101–111)
Creatinine, Ser: 0.65 mg/dL (ref 0.61–1.24)
GFR calc Af Amer: >60 mL/min (ref >60)
Glucose, Bld: 144 mg/dL — ABNORMAL HIGH (ref 65–99)
POTASSIUM: 4.4 mmol/L (ref 3.5–5.1)
Sodium: 140 mmol/L (ref 135–145)
TOTAL PROTEIN: 5.4 g/dL — AB (ref 6.5–8.1)

## 2015-08-21 LAB — PREPARE RBC (CROSSMATCH)

## 2015-08-21 LAB — MRSA PCR SCREENING: MRSA BY PCR: NEGATIVE

## 2015-08-21 LAB — PHOSPHORUS: Phosphorus: 2.7 mg/dL (ref 2.5–4.6)

## 2015-08-21 SURGERY — EGD (ESOPHAGOGASTRODUODENOSCOPY)
Anesthesia: Moderate Sedation | Laterality: Left

## 2015-08-21 MED ORDER — MIDAZOLAM HCL 2 MG/2ML IJ SOLN
2.0000 mg | INTRAMUSCULAR | Status: DC | PRN
  Administered 2015-08-21 (×2): 2 mg via INTRAVENOUS
  Filled 2015-08-21 (×2): qty 2

## 2015-08-21 MED ORDER — FENTANYL CITRATE (PF) 100 MCG/2ML IJ SOLN
INTRAMUSCULAR | Status: AC
  Filled 2015-08-21: qty 4

## 2015-08-21 MED ORDER — ETOMIDATE 2 MG/ML IV SOLN
INTRAVENOUS | Status: AC
  Filled 2015-08-21: qty 10

## 2015-08-21 MED ORDER — SODIUM CHLORIDE 0.9 % IV BOLUS (SEPSIS)
500.0000 mL | Freq: Once | INTRAVENOUS | Status: AC
  Administered 2015-08-21: 500 mL via INTRAVENOUS

## 2015-08-21 MED ORDER — SODIUM CHLORIDE 0.9 % IV SOLN
1.0000 g | Freq: Once | INTRAVENOUS | Status: AC
  Administered 2015-08-21: 1 g via INTRAVENOUS
  Filled 2015-08-21: qty 10

## 2015-08-21 MED ORDER — ETOMIDATE 2 MG/ML IV SOLN
0.3000 mg/kg | Freq: Once | INTRAVENOUS | Status: AC
  Administered 2015-08-21: 20 mg via INTRAVENOUS

## 2015-08-21 MED ORDER — THIAMINE HCL 100 MG/ML IJ SOLN
100.0000 mg | Freq: Every day | INTRAMUSCULAR | Status: DC
  Administered 2015-08-21 – 2015-08-30 (×10): 100 mg via INTRAVENOUS
  Filled 2015-08-21 (×10): qty 2

## 2015-08-21 MED ORDER — FOLIC ACID 5 MG/ML IJ SOLN
1.0000 mg | Freq: Every day | INTRAMUSCULAR | Status: DC
  Administered 2015-08-21 – 2015-08-30 (×10): 1 mg via INTRAVENOUS
  Filled 2015-08-21 (×12): qty 0.2

## 2015-08-21 MED ORDER — CHLORHEXIDINE GLUCONATE 0.12% ORAL RINSE (MEDLINE KIT)
15.0000 mL | Freq: Two times a day (BID) | OROMUCOSAL | Status: DC
  Administered 2015-08-21 – 2015-09-07 (×31): 15 mL via OROMUCOSAL
  Filled 2015-08-21 (×2): qty 15

## 2015-08-21 MED ORDER — FENTANYL CITRATE (PF) 100 MCG/2ML IJ SOLN: 100.0000 ug | INTRAMUSCULAR | Status: DC | PRN

## 2015-08-21 MED ORDER — DEXTROSE 5 % IV SOLN: 1.0000 g | INTRAVENOUS | Status: DC

## 2015-08-21 MED ORDER — VECURONIUM BROMIDE 10 MG IV SOLR
INTRAVENOUS | Status: AC
  Administered 2015-08-21: 8 mg
  Filled 2015-08-21: qty 10

## 2015-08-21 MED ORDER — MIDAZOLAM HCL 5 MG/ML IJ SOLN
INTRAMUSCULAR | Status: AC
  Filled 2015-08-21: qty 3

## 2015-08-21 MED ORDER — MIDAZOLAM HCL 2 MG/2ML IJ SOLN
INTRAMUSCULAR | Status: AC
  Filled 2015-08-21: qty 2

## 2015-08-21 MED ORDER — SUCCINYLCHOLINE CHLORIDE 20 MG/ML IJ SOLN
INTRAMUSCULAR | Status: DC | PRN
  Administered 2015-08-21: 100 mg via INTRAVENOUS

## 2015-08-21 MED ORDER — MAGNESIUM SULFATE IN D5W 10-5 MG/ML-% IV SOLN
1.0000 g | Freq: Once | INTRAVENOUS | Status: DC
  Filled 2015-08-21: qty 100

## 2015-08-21 MED ORDER — SODIUM CHLORIDE 0.9 % IV SOLN
Freq: Once | INTRAVENOUS | Status: AC
  Administered 2015-08-21: 05:00:00 via INTRAVENOUS

## 2015-08-21 MED ORDER — ETHANOLAMINE OLEATE 5 % IV SOLN
INTRAVENOUS | Status: DC | PRN
  Administered 2015-08-21: 4 mL via SUBMUCOSAL

## 2015-08-21 MED ORDER — FENTANYL CITRATE (PF) 100 MCG/2ML IJ SOLN
100.0000 ug | INTRAMUSCULAR | Status: DC | PRN
  Administered 2015-08-21 (×2): 100 ug via INTRAVENOUS
  Filled 2015-08-21: qty 2

## 2015-08-21 MED ORDER — ETOMIDATE 2 MG/ML IV SOLN
INTRAVENOUS | Status: DC | PRN
  Administered 2015-08-21: 14 mg via INTRAVENOUS

## 2015-08-21 MED ORDER — LORAZEPAM 2 MG/ML IJ SOLN
2.0000 mg | INTRAMUSCULAR | Status: DC | PRN
  Administered 2015-08-21: 1 mg via INTRAVENOUS
  Administered 2015-08-21 – 2015-08-22 (×2): 2 mg via INTRAVENOUS
  Filled 2015-08-21 (×3): qty 1

## 2015-08-21 MED ORDER — MORPHINE SULFATE (PF) 2 MG/ML IV SOLN
2.0000 mg | Freq: Once | INTRAVENOUS | Status: AC
  Administered 2015-08-21: 2 mg via INTRAVENOUS
  Filled 2015-08-21: qty 1

## 2015-08-21 MED ORDER — SODIUM CHLORIDE 0.9 % IV SOLN
8.0000 mg/h | INTRAVENOUS | Status: DC
  Administered 2015-08-21 – 2015-08-24 (×6): 8 mg/h via INTRAVENOUS
  Filled 2015-08-21 (×13): qty 80

## 2015-08-21 MED ORDER — ANTISEPTIC ORAL RINSE SOLUTION (CORINZ)
7.0000 mL | Freq: Four times a day (QID) | OROMUCOSAL | Status: DC
  Administered 2015-08-21 – 2015-09-08 (×58): 7 mL via OROMUCOSAL

## 2015-08-21 MED ORDER — FENTANYL CITRATE (PF) 100 MCG/2ML IJ SOLN
INTRAMUSCULAR | Status: AC
  Filled 2015-08-21: qty 2

## 2015-08-21 MED ORDER — SODIUM CHLORIDE 0.9 % IV BOLUS (SEPSIS)
1000.0000 mL | Freq: Once | INTRAVENOUS | Status: AC
  Administered 2015-08-21: 1000 mL via INTRAVENOUS

## 2015-08-21 MED ORDER — SODIUM CHLORIDE 0.9 % IV SOLN: INTRAVENOUS | Status: DC

## 2015-08-21 MED ORDER — PHENYLEPHRINE HCL 10 MG/ML IJ SOLN
30.0000 ug/min | INTRAVENOUS | Status: DC
  Administered 2015-08-21: 30 ug/min via INTRAVENOUS
  Administered 2015-08-22: 120 ug/min via INTRAVENOUS
  Administered 2015-08-22: 80 ug/min via INTRAVENOUS
  Administered 2015-08-22: 120 ug/min via INTRAVENOUS
  Filled 2015-08-21 (×6): qty 1

## 2015-08-21 MED ORDER — SODIUM CHLORIDE 0.9 % IV SOLN
Freq: Once | INTRAVENOUS | Status: AC
  Administered 2015-08-21: 07:00:00 via INTRAVENOUS

## 2015-08-21 MED ORDER — MIDAZOLAM HCL 10 MG/2ML IJ SOLN
INTRAMUSCULAR | Status: DC | PRN
  Administered 2015-08-21: 08:00:00
  Administered 2015-08-21: 12 mg via INTRAVENOUS
  Administered 2015-08-21: 2 mg

## 2015-08-21 MED ORDER — SODIUM CHLORIDE 0.9 % IV SOLN
80.0000 mg | Freq: Two times a day (BID) | INTRAVENOUS | Status: DC
  Filled 2015-08-21: qty 80

## 2015-08-21 MED ORDER — DEXTROSE 5 % IV SOLN
2.0000 g | INTRAVENOUS | Status: DC
  Administered 2015-08-21 – 2015-08-26 (×6): 2 g via INTRAVENOUS
  Filled 2015-08-21 (×6): qty 2

## 2015-08-21 MED ORDER — SODIUM CHLORIDE 0.9% FLUSH
3.0000 mL | Freq: Two times a day (BID) | INTRAVENOUS | Status: DC
  Administered 2015-08-21 – 2015-08-22 (×2): 3 mL via INTRAVENOUS

## 2015-08-21 MED ORDER — FENTANYL CITRATE (PF) 100 MCG/2ML IJ SOLN
INTRAMUSCULAR | Status: DC | PRN
  Administered 2015-08-21: 100 ug via INTRAVENOUS
  Administered 2015-08-21: 100 ug
  Administered 2015-08-21: 100 ug via INTRAVENOUS

## 2015-08-21 MED ORDER — MIDAZOLAM HCL 2 MG/2ML IJ SOLN
2.0000 mg | INTRAMUSCULAR | Status: DC | PRN
  Administered 2015-08-21 – 2015-08-23 (×4): 2 mg via INTRAVENOUS
  Filled 2015-08-21 (×3): qty 2

## 2015-08-21 MED ORDER — DIPHENHYDRAMINE HCL 50 MG/ML IJ SOLN
INTRAMUSCULAR | Status: AC
  Filled 2015-08-21: qty 1

## 2015-08-21 MED ORDER — MAGNESIUM SULFATE 2 GM/50ML IV SOLN
2.0000 g | Freq: Once | INTRAVENOUS | Status: AC
  Administered 2015-08-21: 2 g via INTRAVENOUS
  Filled 2015-08-21: qty 50

## 2015-08-21 MED ORDER — SODIUM CHLORIDE 0.9 % IV SOLN
40.0000 ug/h | INTRAVENOUS | Status: DC
  Administered 2015-08-21: 125 ug/h via INTRAVENOUS
  Administered 2015-08-21: 300 ug/h via INTRAVENOUS
  Administered 2015-08-22: 40 ug/h via INTRAVENOUS
  Filled 2015-08-21 (×3): qty 50

## 2015-08-21 MED ORDER — POTASSIUM CHLORIDE IN NACL 40-0.9 MEQ/L-% IV SOLN
INTRAVENOUS | Status: DC
  Administered 2015-08-21 – 2015-08-22 (×4): 125 mL/h via INTRAVENOUS
  Filled 2015-08-21 (×6): qty 1000

## 2015-08-21 NOTE — Interval H&P Note (Signed)
History and Physical Interval Note:  08/21/2015 8:26 AM  [name]  has presented today for surgery, with the diagnosis of hematemesis  The various methods of treatment have been discussed with the patient and family. After consideration of risks, benefits and other options for treatment, the patient has consented to  Procedure(s): ESOPHAGOGASTRODUODENOSCOPY (EGD) (Left) as a surgical intervention .  The patient's history has been reviewed, patient examined, no change in status, stable for surgery.  I have reviewed the patient's chart and labs.  Questions were answered to the patient's satisfaction.     Sharda Keddy C.

## 2015-08-21 NOTE — Consult Note (Signed)
PULMONARY / CRITICAL CARE MEDICINE   Name: [name] W Eanes MRN: [id number] DOB: 12/25/1980    ADMISSION DATE:  08/20/2015 CONSULTATION DATE: 4/25  REFERRING MD:  [facility]  CHIEF COMPLAINT:  GIB  HISTORY OF PRESENT ILLNESS:   35 yo admitted after episode of hematemesis and hematochezia on 4/24 with continued blood loss till anemia was down to 6.8. He required electrolyte replacement for tetany with mg++, Ca++ and K+. PCCM called to place CVL but due to HD instability. On 4/25  he was intubated at GI request to facilitate EGD. Therefore PCCM will assume his care till stable. He drinks up to a fifth of vodka daily. Reports prior episode of bloody stools but did not seek medical attention. PCCM will assume his care for now.  PAST MEDICAL HISTORY :  He  has a past medical history of GERD (gastroesophageal reflux disease) and ETOH abuse.  PAST SURGICAL HISTORY: He  has no past surgical history on file.  Allergies  Allergen Reactions  . Soy Allergy Other (See Comments)    Swelling.     No current facility-administered medications on file prior to encounter.   Current Outpatient Prescriptions on File Prior to Encounter  Medication Sig  . omeprazole (PRILOSEC) 20 MG capsule Take 20 mg by mouth daily.    FAMILY HISTORY:  His has no family status information on file.   SOCIAL HISTORY: He  reports that he has been smoking Cigarettes.  He has been smoking about 1.00 pack per day. He has never used smokeless tobacco. He reports that he drinks alcohol. He reports that he does not use illicit drugs.  REVIEW OF SYSTEMS:   10 point review of system taken, please see HPI for positives and negatives.  SUBJECTIVE:  Intubated for EGD  VITAL SIGNS: BP 147/98 mmHg  Pulse 124  Temp(Src) 97.2 F (36.2 C) (Axillary)  Resp 14  Ht 5\' 7"  (1.702 m)  Wt 130 lb (58.968 kg)  BMI 20.36 kg/m2  SpO2 99%  HEMODYNAMICS:    VENTILATOR SETTINGS:    INTAKE / OUTPUT: I/O last 3 completed  shifts: In: 1233.3 [Blood:733.3; IV Piggyback:500] Out: 850 [Urine:650; Emesis/NG output:200]  PHYSICAL EXAMINATION: General: WNWDWM, prior to intubation awake and alert Neuro:  Intact prior to intubation HEENT:  ET-vent Cardiovascular:  HSR ST 139 Lungs:  CTA Abdomen:  Tender, + bs Musculoskeletal: Intact Skin:  Warm and dry  LABS:  BMET  Recent Labs Lab 08/20/15 2144 08/21/15 0505  NA 135 140  K 2.7* 4.4  CL 106 110  CO2 22 21*  BUN <5* <5*  CREATININE 0.72 0.65  GLUCOSE 111* 144*    Electrolytes  Recent Labs Lab 08/20/15 2144 08/21/15 0055 08/21/15 0505  CALCIUM 6.5*  --  6.3*  MG  --  0.4* 1.6*  PHOS  --  2.7  --     CBC  Recent Labs Lab 08/20/15 2144  WBC 9.5  HGB 6.7*  HCT 23.3*  PLT 206    Coag's  Recent Labs Lab 08/20/15 2144  APTT 29  INR 1.56*    Sepsis Markers  Recent Labs Lab 08/20/15 2207 08/21/15 0055 08/21/15 0512  LATICACIDVEN 4.82* 2.3* 2.2*    ABG No results for input(s): PHART, PCO2ART, PO2ART in the last 168 hours.  Liver Enzymes  Recent Labs Lab 08/20/15 2144 08/21/15 0505  AST 73* 49*  ALT 24 18  ALKPHOS 86 64  BILITOT 1.4* 2.0*  ALBUMIN 2.2* 1.8*    Cardiac Enzymes No results  for input(s): TROPONINI, PROBNP in the last 168 hours.  Glucose No results for input(s): GLUCAP in the last 168 hours.  Imaging No results found.   STUDIES:  4/25 EGD>>  CULTURES: 4/24 BCx2>>  ANTIBIOTICS: 4/24 roc>>  SIGNIFICANT EVENTS: 4/25 intubated for EGD  LINES/TUBES: 4/25 ET(anesthesia)>> 4/25 rt i j cvl>>  DISCUSSION: 35 yo heavy drinker with GIB and HD instabilty.  ASSESSMENT / PLAN:  PULMONARY A: Acute respiratory failure; Intubated for EGD for GIB and HD instability. P:   PRVC 8cc/kg  Will discussed Gi plans, determine stability and then consider SBT / PSV  CARDIOVASCULAR A:  Sinus tachycardia Hemorrhagic  shock P:  Transfuse as needed Check CVP Sedation as needed for agitation  contribution to sinus tachy  RENAL A:   Electrolyte dissarray  P:   Replete lytes and monitor closely  GASTROINTESTINAL A:   Hematemesis and hematochezia starting 4/23 Hx of ETOH abuse up one fifth vodka P:   Octreotide drip PPI drip GI to perform EGD 4/25 am  HEMATOLOGIC A:   Elevated INR most likely secondary to hepatic impairment from ETOH abuse Blood loss anemia from GIB P:  Follow coags and CBC Avoid anticoagulants Transfuse to keep Hgb >8.0 with active bleed  INFECTIOUS A:   No overt infection, on IV abx empirically to cover 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 Whether he will be willing to seek EtOH rehab   FAMILY  - Updates: No family at bedside  - Inter-disciplinary family meet or Palliative Care meeting due by: May 1  [facility] Minor ACNP Adolph Pollack PCCM Pager [phone] till 3 pm If no answer page [phone] 08/21/2015, 7:59 AM   Attending Note:  I have examined patient, reviewed labs, studies and notes. I have discussed the case with S Minor, and I agree with the data and plans as amended above. Pt is a 34 you alcoholic with no documented cirrhosis, little PMH who was admitted 4/24 with GIB, hematemesis + melena. 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 He had significant drop in Hgb, was moved to ICU for stabilization. On my evaluation he is intubated and sedated, is beginning EGD by Dr Bosie Clos. A CVC was placed for IV access given his hemodynamic instability. BP currently stable. We will check serial CBC, continue octreotide and / or protonix depending on EGD findings. Will also leave him  intubated to insure hemodynamic stability, then assess for wake-up and SBT.  Independent critical care time is 50 minutes.   Levy Pupa, MD, PhD 08/21/2015, 9:38 AM [facility] [phone] or if no answer [phone]

## 2015-08-21 NOTE — Op Note (Signed)
[facility] Patient Name: James CollumDaniel Best Procedure Date: 08/21/2015 MRN: [id number] Attending MD: Shirley FriarVincent C Hazelene Doten , MD Date of Birth: 05-Oct-1980 CSN:  Age: [id number] Admit Type: Inpatient Procedure:                Upper GI endoscopy Indications:              Hematemesis Providers:                Darletta MollJackie Aiken, Technician, Shirley FriarVincent C. Taiquan Campanaro, MD,                            Dow AdolphKaren Hinson, RN Referring MD:             hospital team Medicines:                Fentanyl 200 micrograms IV bolus, Midazolam 2 mg                            IV, Fentanyl drip; Additional meds per CCM team Complications:            Hemorrhage (see above for details) Estimated Blood Loss:     Estimated blood loss: 20 mL. Procedure:                Pre-Anesthesia Assessment:                           - Prior to the procedure, a History and Physical                            was performed, and patient medications and                            allergies were reviewed. The patient's tolerance of                            previous anesthesia was also reviewed. The risks                            and benefits of the procedure and the sedation                            options and risks were discussed with the patient.                            All questions were answered, and informed consent                            was obtained. Prior Anticoagulants: The patient has                            taken no previous anticoagulant or antiplatelet                            agents. ASA Grade Assessment: V - A moribund  patient who is not expected to survive without the                            operation. After reviewing the risks and benefits,                            the patient was deemed in satisfactory condition to                            undergo the procedure.                           After obtaining informed consent, the endoscope was                            passed  under direct vision. Throughout the                            procedure, the patient's blood pressure, pulse, and                            oxygen saturations were monitored continuously. The                            EG-[id number] ([id number]) scope was introduced through the                            mouth, and advanced to the second part of duodenum.                            The upper GI endoscopy was performed with                            difficulty due to excessive bleeding. Successful                            completion of the procedure was aided by                            controlling the bleeding and performing the                            maneuvers documented (below) in this report. The                            patient tolerated the procedure. Scope In: Scope Out: Findings:      Non-bleeding grade II varices were found in the middle third of the       esophagus and in the lower third of the esophagus,. Stigmata of recent       bleeding were evident and red wale signs (nipple sign) were present.       Active bleeding began during attempted banding but wall appeared to be       scarred and could not successfully place a band on  this area. Banding       was unsuccessful despite six attempts with incomplete eradication of       varices. Steady bleeding continued at the end of the procedure. These       were successfully injected with 4 mL ethanolamine for hemostasis.       Estimated blood loss was minimal.      Large amount of red blood and red blood clots seen in the cardia, in the       gastric fundus and in the gastric body preventing visualization of the       fundus and part of the cardia. Could not rule out proximal stomach       gastric varices or ulcers in the proximal stomach. No ulcers seen in the       distal stomach.      The examined duodenum was normal. Impression:               - Recently bleeding grade II esophageal varices.                             Incompletely eradicated. Treatment not successful.                            Injected.                           - Red blood in the cardia, in the gastric fundus                            and in the gastric body.                           - Normal examined duodenum.                           - No specimens collected. Moderate Sedation:      N/A - intubated, sedated (bedside in ICU) Recommendation:           - Give Protonix (pantoprazole): 8 mg/hr IV by                            continuous infusion.                           - Octreotide drip.                           - NPO.                           - Follow H/Hs closely. Continue supportive care.                            Keep intubated today in case rebleeding occurs.                           - If rebleeding occurs, then will need a TIPS  procedure done.                           - Post procedure medication orders were given. Procedure Code(s):        --- Professional ---                           [id number], Esophagogastroduodenoscopy, flexible,                            transoral; with band ligation of esophageal/gastric                            varices                           43243, Esophagogastroduodenoscopy, flexible,                            transoral; with injection sclerosis of                            esophageal/gastric varices Diagnosis Code(s):        --- Professional ---                           K92.0, Hematemesis                           K92.2, Gastrointestinal hemorrhage, unspecified                           I85.01, Esophageal varices with bleeding CPT copyright 2016 American Medical Association. All rights reserved. The codes documented in this report are preliminary and upon coder review may  be revised to meet current compliance requirements. Charlott Rakes, MD Shirley Friar, MD 08/21/2015 5:12:20 PM This report has been signed electronically. Number of Addenda: 0

## 2015-08-21 NOTE — H&P (Addendum)
History and Physical  Patient Name: [name]     ZOX:[id number]    DOB: 22-Nov-1980    DOA: 08/20/2015 Referring provider: Margorie John, MD PCP: Dr. Cletis Media from [place]  Outpatient specialists: Dr. Myrtie Cruise GI Patient coming from: Home  Chief Complaint: Hematemesis and hematochezia  HPI: [name] is a 35 y.o. male with a past medical history significant for heavy alcohol use who presents with hematemesis.  The patient was in his usual state of health until tonight around 8 PM when he had 2 episodes of hematemesis, bright red blood with "large clots" and it, followed by a red bloody stool.  He had no pain and no further bleeding, but felt weak and dizzy and came to the ER.  In the ED, the patient was tachycardic but normotensive.  Na 135, K 2.7, Cr 0.7, BUN <5, albumin 2.2, TBili 1.4, Hgb 6.7, INR 1.5, lactic acid 4.8, alcohol 50 mg/dL.  Octreotide, ceftriaxone, fluids, pantoprazole, and potassium were administered, and the case was discussed by EDP with [facility].  The patient's HR responded to fluids and TRH were requested to evaluate for admission.  He reports "maybe 2" ibuprofen per week, no other NSAIDs or blood thinners.  Has a PCP at [facility], doesn't see much.  History of "fatty liver".  Previously drank a fifth of vodka daily, but scaled back 5 years ago, then had relapse in the last year and has been living with parents since.  Hx of seizure once with alcohol withdrawal, never DTs.  Drinks 2 drinks daily now, to prevent withdrawal, this has been pattern for months.    Last October, was at [employer] with family, had similar episode to tonight with 1 bloody emesis and hematochezia, but was in [place] and didn't know what to do so never sought care, and did not follow up when he returned.   Review of Systems:  All other systems negative except as just noted or noted in the history of present illness.    Past Medical History  Diagnosis Date  . GERD (gastroesophageal  reflux disease)   . ETOH abuse     History reviewed. No pertinent past surgical history.  Social History: Patient lives with his parents.  From [place] originally.  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  Previously using >fifth vodka daily, cut down to 2 drinks daily.  Smokes.      Allergies  Allergen Reactions  . Soy Allergy Other (See Comments)    Swelling.     Family history: family history includes Crohn's disease in his maternal uncle; Heart attack in his maternal grandfather and maternal uncle; Peptic Ulcer in his paternal aunt; Stroke in his maternal grandmother and paternal grandfather.   No family history of liver disease.  Prior to Admission medications   Medication Sig Start Date End Date Taking? Authorizing Provider  omeprazole (PRILOSEC) 20 MG capsule Take 20 mg by mouth daily.   Yes Historical Provider, MD       Physical Exam: BP 130/88 mmHg  Pulse 122  Temp(Src) 98.9 F (37.2 C) (Oral)  Resp 12  Ht  (1.702 m)  Wt 58.968 kg (130 lb)  BMI 20.36 kg/m2  SpO2 100% General appearance: Well-developed, adult male, alert and in no acute distress.  Appears tired.   Eyes: Mild icterus.  Conjunctiva pale, lids and lashes normal.     ENT: No nasal deformity, discharge, or epistaxis.  OP moist without  lesions.  Dried blood on beard. Lymph: No cervical or supraclavicular lymphadenopathy. Skin: Warm and dry.  Pallor noted.  No jaundice.  No caput or angiomas or palmar erythema.   Cardiac: Tachycardic, regular, nl S1-S2, no murmurs appreciated.  Capillary refill is brisk.  No LE edema.  Radial and DP pulses 2+ and symmetric. Respiratory: Normal respiratory rate and rhythm.  CTAB without rales or wheezes. Abdomen: Abdomen soft without rigidity.  No TTP. Mildly distended, I do not appreciate fluid wave or ascites per se, liver edge can't be palpated.   MSK: No deformities or effusions. Neuro: Cranial nerves normal.  Sensorium intact and  responding to questions, attention normal.  Oriented to person, place, time and situation.  Speech is fluent.  Moves all extremities equally and with normal coordination.    Psych: Behavior appropriate.  Affect normal.  No evidence of aural or visual hallucinations or delusions.       Labs on Admission:  I have personally reviewed the following studies: The metabolic panel shows hypokalamiea, normal renal function. Low albumin. Elevated INR 1.5, ptt normal. TBili minimally elevated. Lactate elevated. Alcohol 50 mg/dL at presentation. The complete blood count shows anemia, Hgb 6.7 on presentation.   EKG: Independently reviewed. Rate 132, QTc 467.  Sinus rhythm, non-specific T wave changes, in inferior leads.    Assessment/Plan 1. Acute GIB:  Hematemesis with hematochezia suggests brisk upper source.  Minimal NSAID use and heavy alcohol use with liver synthetic dysfunction suggests varices.  PUD, AVM etc. conceivably within differential. -Octreotide gtt -Ceftriaxone for prophylaxis for varices -Transfuse now and post-transfusion H/H -PPI bolus dosing -NPO -Consult to GI, appreciate cares and definitive treatment    2. Acute blood loss anemia:  No clinical bleeding since arrival to ER. -Transfuse one unit now -Post-transfusion H/H -Target Hgb > 7  3. Hypokalemia, hypomagnesemia and hypocalcemia:  -Replace via IV -Telemetry -Trend BMP  4. Elevated INR, hypoalbuminemia:  History of "fatty liver", EGD showing only hiatal hernia in the past.  Previous heavy alcohol use, currently not abstinent.  No known varices or cirrhosis.  Evidence of liver dysfunction in INR, albumin -Will defer imaging of liver per GI  5. Alcohol abuse, active, with history of withdrawal and seizure once:  -CIWA per SDU protocol with lorazepam -Consult to SW for detox info  6. Elevated lactic acid:  Likely liver dysfunction in setting of hypovolemia -Fluids administered -Sepsis doubted -Trend  lactic acid -Blood cultures are pending, although abx are empiric for suspected variceal bleeding     DVT prophylaxis: SCDs  Code Status: FULL  Family Communication: Mother and father at bedside  Disposition Plan: Anticipate SDU admission, urgent endoscopy in AM.  Fluids, transfusion and close hemodynamic monitoring overnight.  Repeat H/H post-transfusion and trend lactic acid and further fluids/tx pending.  Medical decision making and consults: Patient seen at 11:30 PM on 08/20/2015.  The patient was discussed with Dr. Clydene PughKnott. [facility]  Clinical condition: hemodynamically unstable given HR and large volume blood loss, with low baseline Hgb, possibly.  Currently mentating well and BP normal, HR trending down.      Alberteen SamChristopher P Jeffery Bachmeier [facility] Pager [phone]

## 2015-08-21 NOTE — Progress Notes (Signed)
Critical magnesium of 0.4 called to Donnamarie PoagK. Kirby at [phone].  During that time blood had just been infusing greater than 15min.  No temp spike, not flank or chest pain.  Pt c/o tingling all over and then "muscle spasm" all over.  Index and 2nd digits bilaterally were difficult to bend.  KirbyNP came to the bedside while pt was in distress.  Calcium chloride also ordered and infusing.  Pt now resting quietly, VSS.  Will resume PRBC momentarily.  F/u electrolytes will be drawn following replacement.  Parents at bedside during this time.

## 2015-08-21 NOTE — Progress Notes (Signed)
Patient seen and examined. Currently intubated for air protection and with soft BP. Admitted secondary to acute upper GIB; had hx of alcohol and NSAID's use. Overnight with ongoing hematemesis and melena. Received 4 units of PRBC's. Discussed with PCCM and they have agree to take over the patient until he is extubated and more stable.  Vassie LollMadera, Leonilda Cozby [phone]

## 2015-08-21 NOTE — Progress Notes (Signed)
Update of night events: Around 3am, pt vomited large amount bright red blood.  Pt was able to maintain airway and suction was at bedside. Donnamarie PoagK. Kirby NP, charge RN, and [facility].  NP ordered to receive a total of 3 units of blood and 4 if needed which it was.  Received 4 units emergently over 5 hours. Parents have been at bedside throughout the night and patient has been alert and oriented throughout the night.  Around 6 am heart rate was staying in the 150s and bp was in the 90's with blood infusing, bolus running and maintenance IVF.  At 0700 CCM was at the bedside to place central line, intubation and prepare for EGD.  Mother and father have been at bedside and mother, [name], was updated by nursing staff and director on patient status.  GI at bedside preparing for EGD.

## 2015-08-21 NOTE — Anesthesia Procedure Notes (Signed)
Procedure Name: Intubation Date/Time: 08/21/2015 7:46 AM Performed by: Elyn PeersALLEN, Keghan Mcfarren J Pre-anesthesia Checklist: Patient identified, Emergency Drugs available, Suction available, Patient being monitored and Timeout performed Patient Re-evaluated:Patient Re-evaluated prior to inductionOxygen Delivery Method: Ambu bag Preoxygenation: Pre-oxygenation with 100% oxygen Intubation Type: IV induction, Rapid sequence and Cricoid Pressure applied Laryngoscope Size: Miller and 3 Grade View: Grade I Tube type: Subglottic suction tube Tube size: 8.0 mm Number of attempts: 1 Airway Equipment and Method: Stylet Placement Confirmation: ETT inserted through vocal cords under direct vision,  breath sounds checked- equal and bilateral and CO2 detector Secured at: 23 cm Tube secured with: Tape Dental Injury: Teeth and Oropharynx as per pre-operative assessment

## 2015-08-21 NOTE — Care Management Note (Signed)
Case Management Note  Patient Details  Name: James BalDaniel W [name]: [id number] Date of Birth: 11-01-1980  Subjective/Objective:              Anemia and gi bleed      Action/Plan: Date:  August 21, 2015 Chart reviewed for concurrent status and case management needs. Will continue to follow patient for changes and needs: [name], BSN, RN, [place]   [phone]  Expected Discharge Date:  08/28/15               Expected Discharge Plan:  Home/Self Care  In-House Referral:  NA  Discharge planning Services  CM Consult  Post Acute Care Choice:  NA Choice offered to:  NA  DME Arranged:    DME Agency:     HH Arranged:    HH Agency:     Status of Service:  Completed, signed off  Medicare Important Message Given:    Date Medicare IM Given:    Medicare IM give by:    Date Additional Medicare IM Given:    Additional Medicare Important Message give by:     If discussed at Long Length of Stay Meetings, dates discussed:    Additional Comments:  [name], James Eddleman Lynn, RN 08/21/2015, 9:34 AM

## 2015-08-21 NOTE — Procedures (Signed)
Central Venous Catheter Insertion Procedure Note [name] [id number] Jun 09, 1980  Procedure: Insertion of Central Venous Catheter Indications: Assessment of intravascular volume, Drug and/or fluid administration and Frequent blood sampling  Procedure Details Consent: Risks of procedure as well as the alternatives and risks of each were explained to the (patient/caregiver).  Consent for procedure obtained. Time Out: Verified patient identification, verified procedure, site/side was marked, verified correct patient position, special equipment/implants available, medications/allergies/relevent history reviewed, required imaging and test results available.  Performed  Maximum sterile technique was used including antiseptics, cap, gloves, gown, hand hygiene, mask and sheet. Skin prep: Chlorhexidine; local anesthetic administered A antimicrobial bonded/coated triple lumen catheter was placed in the right internal jugular vein using the Seldinger technique. Ultrasound guidance used.Yes.   Catheter placed to 20 cm. Blood aspirated via all 3 ports and then flushed x 3. Line sutured x 2 and dressing applied.  Evaluation Blood flow good Complications: No apparent complications Patient did tolerate procedure well. Chest X-ray ordered to verify placement.  CXR: pending.  [name] ACNP [name] PCCM Pager [phone] till 3 pm If no answer page [phone] 08/21/2015, 7:44 AM  James Pupaobert Deniss Wormley, MD, PhD 08/21/2015, 9:25 AM [facility] [phone] or if no answer [phone]

## 2015-08-21 NOTE — Progress Notes (Signed)
CSW will f/u with pt re current substance abuse consult once appropriate.   Dellie BurnsJosie Jere Bostrom, MSW, LCSW

## 2015-08-21 NOTE — H&P (View-Only) (Signed)
[facility] Consult Note  Referring Provider: No ref. provider found Primary Care Physician:  No primary care provider on file. Primary Gastroenterologist:  Dr.  Laurel Dimmer Complaint: Hematemesis HPI: [name] is an 35 y.o. white male  who presented with the abrupt onset of  hematemesis this evening with orthostatic dizziness and also noticed a bloody stool. Had a previous normal bowel movement 2 hours earlier. He denies any pain. He is alert and oriented and has not vomited since reaching the emergency room. Hemoglobin 6.7 lactic acid 4.8 with an INR of 1.9. Patient has history of fatty liver disease and alcohol use with no known cirrhosis. He had an EGD showing a small hiatal hernia in 2013. He states he empirically takes nonsteroidal anti-inflammatory drugs and does drink alcohol daily.  Past Medical History  Diagnosis Date  . GERD (gastroesophageal reflux disease)   . ETOH abuse     History reviewed. No pertinent past surgical history.   (Not in a hospital admission)  Allergies:  Allergies  Allergen Reactions  . Soy Allergy Other (See Comments)    Swelling.     History reviewed. No pertinent family history.  Social History:  reports that he has been smoking Cigarettes.  He has been smoking about 1.00 pack per day. He has never used smokeless tobacco. He reports that he drinks alcohol. He reports that he does not use illicit drugs.  Review of Systems: negative except As above   Blood pressure 133/85, pulse 138, temperature 98.9 F (37.2 C), temperature source Oral, resp. rate 16, height _0  (1.702 m), weight 58.968 kg (130 lb), SpO2 100 %. Head: Normocephalic, without obvious abnormality, atraumatic Neck: no adenopathy, no carotid bruit, no JVD, supple, symmetrical, trachea midline and thyroid not enlarged, symmetric, no tenderness/mass/nodules Resp: clear to auscultation bilaterally Cardio: regular rate and rhythm, S1, S2 normal, no murmur, click, rub or  gallop GI: Abdomen soft nondistended with normoactive bowel sounds. No hepatosplenomegaly mass or guarding. Extremities: extremities normal, atraumatic, no cyanosis or edema  Results for orders placed or performed during the hospital encounter of 08/20/15 (from the past 48 hour(s))  POC occult blood, ED     Status: Abnormal   Collection Time: 08/20/15  9:37 PM  Result Value Ref Range   Fecal Occult Bld POSITIVE (A) NEGATIVE  Type and screen [place]     Status: None (Preliminary result)   Collection Time: 08/20/15  9:42 PM  Result Value Ref Range   ABO/RH(D) O NEG    Antibody Screen PENDING    Sample Expiration 08/23/2015   Comprehensive metabolic panel     Status: Abnormal   Collection Time: 08/20/15  9:44 PM  Result Value Ref Range   Sodium 135 135 - 145 mmol/L   Potassium 2.7 (LL) 3.5 - 5.1 mmol/L    Comment: CRITICAL RESULT CALLED TO, READ BACK BY AND VERIFIED WITH: A.LEDWELL,RN AT 2220 ON 08/20/15 BY W.SHEA    Chloride 106 101 - 111 mmol/L   CO2 22 22 - 32 mmol/L   Glucose, Bld 111 (H) 65 - 99 mg/dL   BUN <5 (L) 6 - 20 mg/dL   Creatinine, Ser 0.72 0.61 - 1.24 mg/dL   Calcium 6.5 (L) 8.9 - 10.3 mg/dL   Total Protein 6.7 6.5 - 8.1 g/dL   Albumin 2.2 (L) 3.5 - 5.0 g/dL   AST 73 (H) 15 - 41 U/L   ALT 24 17 - 63 U/L   Alkaline Phosphatase 86 38 -  126 U/L   Total Bilirubin 1.4 (H) 0.3 - 1.2 mg/dL   GFR calc non Af Amer >60 >60 mL/min   GFR calc Af Amer >60 >60 mL/min    Comment: (NOTE) The eGFR has been calculated using the CKD EPI equation. This calculation has not been validated in all clinical situations. eGFR's persistently <60 mL/min signify possible Chronic Kidney Disease.    Anion gap 7 5 - 15  CBC with Differential/Platelet     Status: Abnormal   Collection Time: 08/20/15  9:44 PM  Result Value Ref Range   WBC 9.5 4.0 - 10.5 K/uL   RBC 3.35 (L) 4.22 - 5.81 MIL/uL   Hemoglobin 6.7 (LL) 13.0 - 17.0 g/dL    Comment: CRITICAL RESULT CALLED TO,  READ BACK BY AND VERIFIED WITH: INFINGER,K RN [phone] C[place],N    HCT 23.3 (L) 39.0 - 52.0 %   MCV 69.6 (L) 78.0 - 100.0 fL   MCH 20.0 (L) 26.0 - 34.0 pg   MCHC 28.8 (L) 30.0 - 36.0 g/dL   RDW 20.4 (H) 11.5 - 15.5 %   Platelets 206 150 - 400 K/uL   Neutrophils Relative % 71 %   Lymphocytes Relative 11 %   Monocytes Relative 16 %   Eosinophils Relative 1 %   Basophils Relative 1 %   Neutro Abs 6.8 1.7 - 7.7 K/uL   Lymphs Abs 1.0 0.7 - 4.0 K/uL   Monocytes Absolute 1.5 (H) 0.1 - 1.0 K/uL   Eosinophils Absolute 0.1 0.0 - 0.7 K/uL   Basophils Absolute 0.1 0.0 - 0.1 K/uL   RBC Morphology POLYCHROMASIA PRESENT   APTT     Status: None   Collection Time: 08/20/15  9:44 PM  Result Value Ref Range   aPTT 29 24 - 37 seconds  Protime-INR     Status: Abnormal   Collection Time: 08/20/15  9:44 PM  Result Value Ref Range   Prothrombin Time 18.7 (H) 11.6 - 15.2 seconds   INR 1.56 (H) 0.00 - 1.49  I-Stat CG4 Lactic Acid, ED     Status: Abnormal   Collection Time: 08/20/15 10:07 PM  Result Value Ref Range   Lactic Acid, Venous 4.82 (HH) 0.5 - 2.0 mmol/L   Comment NOTIFIED PHYSICIAN   Ethanol     Status: Abnormal   Collection Time: 08/20/15 10:21 PM  Result Value Ref Range   Alcohol, Ethyl (B) 50 (H) <5 mg/dL    Comment:        LOWEST DETECTABLE LIMIT FOR SERUM ALCOHOL IS 5 mg/dL FOR MEDICAL PURPOSES ONLY    No results found.  Assessment: Hematemesis with melena in a patient with history of alcohol use. Plan:  IV PPI Transfuse PRBCs IV octreotide EGD early in the morning. Calianne Larue C 08/20/2015, 11:20 PM  Pager [phone] If no answer or after 5 PM call [phone]

## 2015-08-21 NOTE — Brief Op Note (Signed)
Mid-esophageal nipple sign consistent with a variceal bleed - unsuccessful variceal banding. Sclerotherapy done to the area with hemostasis achieved. If rebleeding occurs then will need TIPS due to inability for bands to adhere to the esophageal wall doubt any role for repeat therapeutic endoscopy. Unable to clear fundus of stomach due to blood products so not able to see if any gastric varices seen in proximal stomach. See Provation note for details. Continue Octreotide drip. Protonix drip in case an ulcer source in proximal stomach. Keep intubated for next 24 hours. Follow H/Hs closely. Supportive care.

## 2015-08-21 NOTE — Progress Notes (Addendum)
Pt admitted earlier with GIB, hx ETOH. Magnesium level 0.4 and pt receiving total of 4Gms IV. Corrected Calcium 7.94 but pt exhibiting signs of tetany. + carpopedal spasm and + trousseau's sign. No acute tele changes. No signs of laryngospasm or seizure activity. Repleting Magnesium, Calcium and Potassium at present. Pt receiving blood transfusion-no signs of blood reaction and do not believe above symptoms are related to blood transfusion.  Recheck electrolytes after dosing. CBC after 2 U PRBCs finished. Explained symptoms, reason and treatment to pt.  Ativan with CIWA.  Discussed changes with admitting MD who agrees with plan. Stayed with pt awhile and tetany resolved after Ca infused. KJKG, NP [facility] After above, NP called back to room because pt vomited a large amount of bright red blood. Pt appears well, NAD. Pale. Tachycardic. BP 120s. PRBCs infusing. Magnesium 4/4gms finishing. Oceatride infusing. Blood infusing. Pt has 2 PIVs. Given electrolyte repletion, aggressive fluid replacement, blood, and now active hematemesis, called PCCM to place a central line. Give NS bolus and go ahead and give 3rd unit PRBCs when 2nd one finishes. Check H/H after 3rd unit unless he has hematemesis again. Will call GI should pt become hemodynamically unstable or continue to have hematemesis. EGD planned for this am. GI already saw pt on admission.  Will make sure blood bank stays 2 units ahead. Will follow.  KJKG, NP [facility] Update: One bloody stool, about 250cc. 3rd unit PRBC infusing. Will go ahead and give 4th unit, then check H/H. Calcium corrected now 8.06, given 1 more gram of Ca IV. Mg up to 1.6. Give i more gram. K is normal.  KJKG, NP [facility] HR 150s. BP 114/82. 1L bolus now. 12 lead EKG now. Pt receiving 4th unit PRBCs, then H/H.  Report to oncoming attending at 0700. KJKG, NP [facility]

## 2015-08-22 ENCOUNTER — Encounter (HOSPITAL_COMMUNITY): Payer: Self-pay | Admitting: Gastroenterology

## 2015-08-22 ENCOUNTER — Inpatient Hospital Stay (HOSPITAL_COMMUNITY): Payer: Medicaid Other

## 2015-08-22 DIAGNOSIS — N179 Acute kidney failure, unspecified: Secondary | ICD-10-CM

## 2015-08-22 LAB — CBC
HCT: 24.9 % — ABNORMAL LOW (ref 39.0–52.0)
HCT: 24.6 % — ABNORMAL LOW (ref 39.0–52.0)
HEMATOCRIT: 22.7 % — AB (ref 39.0–52.0)
HEMOGLOBIN: 7.2 g/dL — AB (ref 13.0–17.0)
HEMOGLOBIN: 7.8 g/dL — AB (ref 13.0–17.0)
Hemoglobin: 7.9 g/dL — ABNORMAL LOW (ref 13.0–17.0)
MCH: 25.6 pg — ABNORMAL LOW (ref 26.0–34.0)
MCH: 25.9 pg — ABNORMAL LOW (ref 26.0–34.0)
MCH: 25.7 pg — AB (ref 26.0–34.0)
MCHC: 31.7 g/dL (ref 30.0–36.0)
MCHC: 31.7 g/dL (ref 30.0–36.0)
MCHC: 31.7 g/dL (ref 30.0–36.0)
MCV: 80.6 fL (ref 78.0–100.0)
MCV: 81.7 fL (ref 78.0–100.0)
MCV: 81.2 fL (ref 78.0–100.0)
Platelets: 398 10*3/uL (ref 150–400)
Platelets: 402 10*3/uL — ABNORMAL HIGH (ref 150–400)
Platelets: 328 10*3/uL (ref 150–400)
RBC: 3.09 MIL/uL — ABNORMAL LOW (ref 4.22–5.81)
RBC: 2.78 MIL/uL — ABNORMAL LOW (ref 4.22–5.81)
RBC: 3.03 MIL/uL — AB (ref 4.22–5.81)
RDW: 20 % — ABNORMAL HIGH (ref 11.5–15.5)
RDW: 20.1 % — ABNORMAL HIGH (ref 11.5–15.5)
RDW: 19.4 % — ABNORMAL HIGH (ref 11.5–15.5)
WBC: 45.2 10*3/uL — ABNORMAL HIGH (ref 4.0–10.5)
WBC: 46.1 10*3/uL — ABNORMAL HIGH (ref 4.0–10.5)
WBC: 44.1 10*3/uL — ABNORMAL HIGH (ref 4.0–10.5)

## 2015-08-22 LAB — BASIC METABOLIC PANEL
ANION GAP: 8 (ref 5–15)
Anion gap: 10 (ref 5–15)
Anion gap: 5 (ref 5–15)
BUN: 7 mg/dL (ref 6–20)
BUN: 7 mg/dL (ref 6–20)
BUN: 8 mg/dL (ref 6–20)
CALCIUM: 5.3 mg/dL — AB (ref 8.9–10.3)
CALCIUM: 6 mg/dL — AB (ref 8.9–10.3)
CHLORIDE: 117 mmol/L — AB (ref 101–111)
CO2: 15 mmol/L — ABNORMAL LOW (ref 22–32)
CO2: 13 mmol/L — ABNORMAL LOW (ref 22–32)
CO2: 20 mmol/L — ABNORMAL LOW (ref 22–32)
CREATININE: 1.67 mg/dL — AB (ref 0.61–1.24)
Calcium: 5.9 mg/dL — CL (ref 8.9–10.3)
Chloride: 117 mmol/L — ABNORMAL HIGH (ref 101–111)
Chloride: 117 mmol/L — ABNORMAL HIGH (ref 101–111)
Creatinine, Ser: 1.41 mg/dL — ABNORMAL HIGH (ref 0.61–1.24)
Creatinine, Ser: 1.54 mg/dL — ABNORMAL HIGH (ref 0.61–1.24)
GFR calc Af Amer: >60 mL/min (ref >60)
GFR calc Af Amer: >60 mL/min (ref >60)
GFR calc non Af Amer: 57 mL/min — ABNORMAL LOW (ref >60)
GFR, EST AFRICAN AMERICAN: 60 mL/min — AB (ref >60)
GFR, EST NON AFRICAN AMERICAN: 52 mL/min — AB (ref >60)
GLUCOSE: 107 mg/dL — AB (ref 65–99)
GLUCOSE: 148 mg/dL — AB (ref 65–99)
Glucose, Bld: 76 mg/dL (ref 65–99)
POTASSIUM: 6.7 mmol/L — AB (ref 3.5–5.1)
POTASSIUM: 5.3 mmol/L — AB (ref 3.5–5.1)
Potassium: 6 mmol/L — ABNORMAL HIGH (ref 3.5–5.1)
SODIUM: 140 mmol/L (ref 135–145)
SODIUM: 140 mmol/L (ref 135–145)
Sodium: 142 mmol/L (ref 135–145)

## 2015-08-22 LAB — BLOOD GAS, ARTERIAL
ACID-BASE DEFICIT: 6.4 mmol/L — AB (ref 0.0–2.0)
ACID-BASE DEFICIT: 12.7 mmol/L — AB (ref 0.0–2.0)
Bicarbonate: 18.4 mEq/L — ABNORMAL LOW (ref 20.0–24.0)
Bicarbonate: 13.3 mEq/L — ABNORMAL LOW (ref 20.0–24.0)
DRAWN BY: 257701
DRAWN BY: 441261
FIO2: 0.4
FIO2: 0.4
MECHVT: 530 mL
O2 SAT: 98.6 %
O2 Saturation: 99.6 %
PEEP/CPAP: 5 cmH2O
PEEP/CPAP: 5 cmH2O
PH ART: 7.235 — AB (ref 7.350–7.450)
PO2 ART: 133 mmHg — AB (ref 80.0–100.0)
Patient temperature: 98.6
Patient temperature: 98.7
RATE: 16 resp/min
RATE: 16 resp/min
TCO2: 17.6 mmol/L (ref 0–100)
TCO2: 13.3 mmol/L (ref 0–100)
VT: 530 mL
pCO2 arterial: 35.8 mmHg (ref 35.0–45.0)
pCO2 arterial: 32.6 mmHg — ABNORMAL LOW (ref 35.0–45.0)
pH, Arterial: 7.331 — ABNORMAL LOW (ref 7.350–7.450)
pO2, Arterial: 198 mmHg — ABNORMAL HIGH (ref 80.0–100.0)

## 2015-08-22 LAB — PREPARE RBC (CROSSMATCH)

## 2015-08-22 LAB — LACTIC ACID, PLASMA: LACTIC ACID, VENOUS: 4.1 mmol/L — AB (ref 0.5–2.0)

## 2015-08-22 LAB — MAGNESIUM: MAGNESIUM: 1 mg/dL — AB (ref 1.7–2.4)

## 2015-08-22 LAB — TROPONIN I
Troponin I: <0.03 ng/mL (ref <0.031)
Troponin I: <0.03 ng/mL (ref <0.031)

## 2015-08-22 LAB — PROTIME-INR
INR: 2.19 — ABNORMAL HIGH (ref 0.00–1.49)
PROTHROMBIN TIME: 24.2 s — AB (ref 11.6–15.2)

## 2015-08-22 LAB — PHOSPHORUS: PHOSPHORUS: 1.9 mg/dL — AB (ref 2.5–4.6)

## 2015-08-22 MED ORDER — INSULIN ASPART 100 UNIT/ML ~~LOC~~ SOLN
10.0000 [IU] | Freq: Once | SUBCUTANEOUS | Status: AC
  Administered 2015-08-22: 10 [IU] via INTRAVENOUS
  Filled 2015-08-22: qty 0.1

## 2015-08-22 MED ORDER — SODIUM POLYSTYRENE SULFONATE 15 GM/60ML PO SUSP
30.0000 g | Freq: Once | ORAL | Status: AC
  Administered 2015-08-22: 30 g via RECTAL
  Filled 2015-08-22: qty 120

## 2015-08-22 MED ORDER — SODIUM CHLORIDE 0.9 % IV SOLN
Freq: Once | INTRAVENOUS | Status: AC
Start: 2015-08-22 — End: 2015-08-22
  Administered 2015-08-22: 10:00:00 via INTRAVENOUS

## 2015-08-22 MED ORDER — SODIUM CHLORIDE 0.9 % IV BOLUS (SEPSIS)
1000.0000 mL | Freq: Once | INTRAVENOUS | Status: AC
  Administered 2015-08-22: 1000 mL via INTRAVENOUS

## 2015-08-22 MED ORDER — DEXMEDETOMIDINE HCL IN NACL 200 MCG/50ML IV SOLN
0.0000 ug/kg/h | INTRAVENOUS | Status: DC
  Administered 2015-08-22 (×3): 0.4 ug/kg/h via INTRAVENOUS
  Administered 2015-08-23 (×2): 0.6 ug/kg/h via INTRAVENOUS
  Filled 2015-08-22 (×5): qty 50

## 2015-08-22 MED ORDER — SODIUM CHLORIDE 0.9 % IV SOLN
INTRAVENOUS | Status: DC
  Administered 2015-08-22: 06:00:00 via INTRAVENOUS

## 2015-08-22 MED ORDER — SODIUM BICARBONATE 8.4 % IV SOLN
INTRAVENOUS | Status: DC
  Administered 2015-08-22 – 2015-08-23 (×3): via INTRAVENOUS
  Filled 2015-08-22 (×3): qty 150

## 2015-08-22 MED ORDER — VITAMIN K1 10 MG/ML IJ SOLN
10.0000 mg | Freq: Once | INTRAVENOUS | Status: AC
  Administered 2015-08-22: 10 mg via INTRAVENOUS
  Filled 2015-08-22: qty 1

## 2015-08-22 MED ORDER — DEXTROSE 50 % IV SOLN
50.0000 mL | Freq: Once | INTRAVENOUS | Status: AC
  Administered 2015-08-22: 50 mL via INTRAVENOUS
  Filled 2015-08-22: qty 50

## 2015-08-22 MED ORDER — CALCIUM GLUCONATE 10 % IV SOLN
2.0000 g | Freq: Once | INTRAVENOUS | Status: AC
  Administered 2015-08-22: 2 g via INTRAVENOUS
  Filled 2015-08-22: qty 20

## 2015-08-22 MED ORDER — PHENYLEPHRINE HCL 10 MG/ML IJ SOLN
30.0000 ug/min | INTRAMUSCULAR | Status: DC
  Administered 2015-08-22: 100 ug/min via INTRAVENOUS
  Administered 2015-08-22: 140 ug/min via INTRAVENOUS
  Administered 2015-08-22: 150 ug/min via INTRAVENOUS
  Administered 2015-08-23: 60 ug/min via INTRAVENOUS
  Administered 2015-08-23: 110 ug/min via INTRAVENOUS
  Administered 2015-08-23: 60 ug/min via INTRAVENOUS
  Administered 2015-08-24 – 2015-08-25 (×2): 20 ug/min via INTRAVENOUS
  Filled 2015-08-22 (×8): qty 4

## 2015-08-22 MED ORDER — SODIUM CHLORIDE 0.9 % IV SOLN
1.0000 g | Freq: Once | INTRAVENOUS | Status: AC
  Administered 2015-08-22: 1 g via INTRAVENOUS
  Filled 2015-08-22: qty 10

## 2015-08-22 MED ORDER — ALBUTEROL SULFATE (2.5 MG/3ML) 0.083% IN NEBU
2.5000 mg | INHALATION_SOLUTION | RESPIRATORY_TRACT | Status: AC
  Administered 2015-08-22 (×4): 2.5 mg via RESPIRATORY_TRACT
  Filled 2015-08-22 (×4): qty 3

## 2015-08-22 NOTE — Progress Notes (Addendum)
eLink Physician-Brief Progress Note Patient Name: James BalDaniel W Sones DOB: Dec 09, 1980 MRN: [id number]   Date of Service  08/22/2015  HPI/Events of Note  12 Lead EKG confirms sinus tachycardia. Etiology uncertain. Fever vs anemia vs volume depletion.   eICU Interventions  Will order: 1. 0.9 NaCl 1 liter IV over 1 hour now. Follow fever and Hgb closely.      Intervention Category Major Interventions: Arrhythmia - evaluation and management  Sommer,Steven Eugene 08/22/2015, 3:06 AM

## 2015-08-22 NOTE — Progress Notes (Signed)
Charting error for Medication given at 0840 on 08/21/2015 during EGD Versed only 2mg  Versed given at this time.

## 2015-08-22 NOTE — Progress Notes (Signed)
Patient ID: [name], male   DOB: 1980/11/14, 35 y.o.   MRN: [id number] [facility] Progress [facility]  [name] 34 y.o. 1980/11/14   Subjective: Dark maroon-colored blood this morning per nursing. Awake on ventilator. Father at bedside.  Objective: Vital signs in last 24 hours: Filed Vitals:   08/22/15 0730 08/22/15 0734  BP: 91/58 91/58  Pulse: 147 150  Temp: 99.7 F (37.6 C)   Resp:  19    Physical Exam: Gen: lethargic, no acute distress, intubated HEENT: anciteric sclera CV: RRR Chest: CTA B Abd: soft, nontender, nondistended, +BS Skin: no jaundice  Lab Results:  Recent Labs  08/21/15 0055 08/21/15 0505 08/22/15 0450  NA  --  140 140  K  --  4.4 6.7*  CL  --  110 117*  CO2  --  21* 15*  GLUCOSE  --  144* 107*  BUN  --  <5* 7  CREATININE  --  0.65 1.41*  CALCIUM  --  6.3* 5.3*  MG 0.4* 1.6* 1.0*  PHOS 2.7  --  1.9*    Recent Labs  08/20/15 2144 08/21/15 0505  AST 73* 49*  ALT 24 18  ALKPHOS 86 64  BILITOT 1.4* 2.0*  PROT 6.7 5.4*  ALBUMIN 2.2* 1.8*    Recent Labs  08/20/15 2144  08/22/15 0200 08/22/15 0600  WBC 9.5  < > 45.2* 46.1*  NEUTROABS 6.8  --   --   --   HGB 6.7*  < > 7.9* 7.2*  HCT 23.3*  < > 24.9* 22.7*  MCV 69.6*  < > 80.6 81.7  PLT 206  < > 398 402*  < > = values in this interval not displayed.  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 Hgb 7.2 and plan is to transfuse one unit of blood this morning. Electrolyte abnormalities this morning noted and being managed by CCM. Continue Protonix and Octreotide drip. Will follow.   Ola Raap C. 08/22/2015, 10:46 AM  Pager [phone]  If no answer or after 5 PM call [phone]

## 2015-08-22 NOTE — Progress Notes (Signed)
eLink Physician-Brief Progress Note Patient Name: James BalDaniel W Best DOB: [birth year] MRN: [id number]   Date of Service  08/22/2015  HPI/Events of Note  Tachycardia - HR = 155. Looks like sinus tachycardia on the monitor. BP = 90/61. Therefore, can't B-Block. Low grade temp = 100.4 and Hgb = 7.9.  eICU Interventions  Will order:  1. 12 Lead EKG to confirm sinus tachycardia.     Intervention Category Major Interventions: Arrhythmia - evaluation and management  Sommer,Steven Eugene 08/22/2015, 2:52 AM

## 2015-08-22 NOTE — Progress Notes (Signed)
eLink Physician-Brief Progress Note Patient Name: Marcie BalDaniel W Iseman DOB: 1981-01-12 MRN: [id number]   Date of Service  08/22/2015  HPI/Events of Note  HR = 145. BP = 72/51 with MAP = 57. Hgb = 7.9.  eICU Interventions  Will monitor CVP and direct Rx with that information.      Intervention Category Intermediate Interventions: Arrhythmia - evaluation and management  Sommer,Steven Eugene 08/22/2015, 12:10 AM

## 2015-08-22 NOTE — Progress Notes (Signed)
CRITICAL VALUE ALERT  Critical value received:  Lactic 4.1, K+ 6.7, Ca+ 5.3  Date of notification:  08/22/15  Time of notification:  0545  Critical value read back:Yes.    Nurse who received alert:  Max SaneMackenzie Meridith Romick, RN  MD notified (1st page):  Elink  Time of first page:  [phone]

## 2015-08-22 NOTE — Progress Notes (Signed)
eLink Physician-Brief Progress Note Patient Name: James BalDaniel W Best DOB: August 16, 1980 MRN: [id number]   Date of Service  08/22/2015  HPI/Events of Note  Multiple issues: 1. K+ = 6.7, 2. Lactic Acid = 4.1, 3. Ca++ = 5.3 and albumin = 1.8. Ca++ corrects to 7.06. CVP = 16. Increased vasopressor requirement. Hgb = 7.2.  eICU Interventions  Will order: 1. D/C K+ in IV fluid. 2. Replace Ca++. 3. Kayexalate enema. 4. BMP at 10 AM.  5. ABG now. 6. Coox.     Intervention Category Major Interventions: Electrolyte abnormality - evaluation and management;Acid-Base disturbance - evaluation and management  Sommer,Steven Eugene 08/22/2015, 6:35 AM

## 2015-08-22 NOTE — Progress Notes (Signed)
Afternoon f/u  Still pressor dependent  Had some non-specific ST changes this am. Trop I has been negative.  Had significant acidosis and hyperkalemia which we have treated.  Plan -cont bicarb gtt w/ serial checks of chemistry  -f/u cbc after this am's transfusion  -cont precedex for w/d prophylaxis and ventilator support.  -cont Neo gtt, the precedex may be a contributor to his hypotension; also consider the possibility of ETOH related CM -f/u CEs -get ECHO-->If he has wall motion abnormality or New CM will need cardiology involvement.    Father updated at bedside CCM time 20  Minutes   Simonne MartinetPeter E Innocence Schlotzhauer ACNP-BC [facility] Pager # [phone] OR # [phone] if no answer

## 2015-08-22 NOTE — Progress Notes (Signed)
12.5 mg of fentanyl from bag wasted down sink with Marcille BuffyShannon Armstrong, RN

## 2015-08-22 NOTE — Progress Notes (Signed)
Initial Nutrition Assessment  DOCUMENTATION CODES:   Not applicable  INTERVENTION:  -RD continue to monitor  NUTRITION DIAGNOSIS:   Inadequate oral intake related to inability to eat as evidenced by NPO status.  GOAL:   Patient will meet greater than or equal to 90% of their needs  MONITOR:   I & O's, Labs, Vent status, Weight trends  REASON FOR ASSESSMENT:   Ventilator    ASSESSMENT:   [name] is an 35 y.o. white male who presented with the abrupt onset of hematemesis this evening with orthostatic dizziness and also noticed a bloody stool. Had a previous normal bowel movement 2 hours earlier. He denies any pain. He is alert and oriented and has not vomited since reaching the emergency room.  Patient is currently intubated on ventilator support MV: 11.3 L/min Temp (24hrs), Avg:99.3 F (37.4 C), Min:97 F (36.1 C), Max:100.6 F (38.1 C)  Propofol: None  Spoke to [name] briefly at bedside. He awake, alert, oriented, but intubated. Was intubated for airway protection to perform an EGD. Per RN, he may be extubated this evening. Pt endorses mildly poor appetite PTA. Denies weight loss. Denies nausea/vomiting or chewing/swallowing problems. With esophageal varices, it is unlikely that we will begin tube feeds. Will follow-up tomorrow justi n case.  Labs: K 6.7, Ca 5.3, Phos 1.9, Mg 1.0 Medications: Thiamine, Neo drip; Precedex drip; Fentanyl drip; D5 @ 14225mL/hr -> 510 calories  Diet Order:  Diet NPO time specified  Skin:  Reviewed, no issues  Last BM:  4/26  Height:   Ht Readings from Last 1 Encounters:  08/21/15 5\' 7"  (1.702 m)    Weight:   Wt Readings from Last 1 Encounters:  08/20/15 130 lb (58.968 kg)    Ideal Body Weight:  67.27 kg  BMI:  Body mass index is 20.36 kg/(m^2).  Estimated Nutritional Needs:   Kcal:  1928  Protein:  70-90 grams  Fluid:  >/= 1.9L  EDUCATION NEEDS:   No education needs identified at this  time  Dionne AnoWilliam M. Shaneka Efaw, MS, RD LDN After Hours/Weekend Pager [phone]

## 2015-08-22 NOTE — Progress Notes (Signed)
PULMONARY / CRITICAL CARE MEDICINE   Name: [name]: [id number] DOB: [birth year]    ADMISSION DATE:  08/20/2015 CONSULTATION DATE: 4/25  REFERRING MD:  [facility]  CHIEF COMPLAINT:  GIB  HISTORY OF PRESENT ILLNESS:   35 yo admitted after episode of hematemesis and hematochezia on 4/24 with continued blood loss till anemia was down to 6.8. He required electrolyte replacement for tetany with mg++, Ca++ and K+. PCCM called to place CVL but due to HD instability. On 4/25  he was intubated at GI request to facilitate EGD. Therefore PCCM will assume his care till stable. He drinks up to a fifth of vodka daily. Reports prior episode of bloody stools but did not seek medical attention. PCCM will assume his care for now.  SUBJECTIVE:  Intubated, awake, nursing weaning pressors. Seems appropriate   VITAL SIGNS: BP 91/58 mmHg  Pulse 150  Temp(Src) 99.7 F (37.6 C) (Core (Comment))  Resp 19  Ht 5' 7"  (1.702 m)  Wt 130 lb (58.968 kg)  BMI 20.36 kg/m2  SpO2 100%  HEMODYNAMICS: CVP:  [10 mmHg-16 mmHg] 16 mmHg  VENTILATOR SETTINGS: Vent Mode:  [-] PRVC FiO2 (%):  [40 %-50 %] 40 % Set Rate:  [16 bmp] 16 bmp Vt Set:  [530 mL] 530 mL PEEP:  [5 cmH20] 5 cmH20 Plateau Pressure:  [20 OIZ12-45 cmH20] 24 cmH20  INTAKE / OUTPUT: I/O last 3 completed shifts: In: 13343.7 [I.V.:6750.3; Blood:1053.3; Other:4500; IV Piggyback:1040] Out: 8099 [Urine:1050; Emesis/NG output:500; Stool:2]  PHYSICAL EXAMINATION: General: WM awake and interactive on vent. He is not in distress. Currently OFF fent infusion  Neuro:  Intact prior to intubation; follows commands currently w/out any evidence of focal def  HEENT:  ET-vent, NO JVD  Cardiovascular:  ST on tele w/ HR >150 Lungs:  CTA; equal rise on vent  Abdomen:  Tender, + bs Musculoskeletal: Intact Skin:  Warm and dry  LABS:  BMET  Recent Labs Lab 08/20/15 2144 08/21/15 0505 08/22/15 0450  NA 135 140 140  K 2.7* 4.4 6.7*  CL 106 110 117*   CO2 22 21* 15*  BUN <5* <5* 7  CREATININE 0.72 0.65 1.41*  GLUCOSE 111* 144* 107*    Electrolytes  Recent Labs Lab 08/20/15 2144 08/21/15 0055 08/21/15 0505 08/22/15 0450  CALCIUM 6.5*  --  6.3* 5.3*  MG  --  0.4* 1.6* 1.0*  PHOS  --  2.7  --  1.9*    CBC  Recent Labs Lab 08/21/15 2330 08/22/15 0200 08/22/15 0600  WBC 39.6* 45.2* 46.1*  HGB 7.9* 7.9* 7.2*  HCT 24.3* 24.9* 22.7*  PLT 378 398 402*    Coag's  Recent Labs Lab 08/20/15 2144 08/22/15 0450  APTT 29  --   INR 1.56* 2.19*    Sepsis Markers  Recent Labs Lab 08/21/15 0055 08/21/15 0512 08/22/15 0450  LATICACIDVEN 2.3* 2.2* 4.1*    ABG  Recent Labs Lab 08/21/15 0830 08/22/15 0734  PHART 7.331* 7.235*  PCO2ART 35.8 32.6*  PO2ART 198* 133*    Liver Enzymes  Recent Labs Lab 08/20/15 2144 08/21/15 0505  AST 73* 49*  ALT 24 18  ALKPHOS 86 64  BILITOT 1.4* 2.0*  ALBUMIN 2.2* 1.8*    Cardiac Enzymes No results for input(s): TROPONINI, PROBNP in the last 168 hours.  Glucose No results for input(s): GLUCAP in the last 168 hours.  Imaging Dg Chest Port 1 View  08/22/2015  CLINICAL DATA:  Respiratory failure. EXAM: PORTABLE CHEST 1 VIEW  COMPARISON:  08/21/2015. FINDINGS: Endotracheal tube and right IJ line stable position. Heart size stable. Low lung volumes with mild bibasilar atelectasis. No pleural effusion or pneumothorax. IMPRESSION: 1. Endotracheal tube and right IJ line stable position. 2.  Bibasilar subsegmental atelectasis. 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 Stigmata of recent bleeding were evident and red wale signs (nipple sign) were present. Active bleeding began during attempted banding but wall appeared to be scarred and could not successfully place a band on this area. Banding was  unsuccessful despite six attempts with incomplete eradication of varices. Steady bleeding continued at the end of the procedure. These were successfully injected with 4 mL ethanolamine for hemostasis.  CULTURES: 4/24 BCx2>>  ANTIBIOTICS: 4/24 roc>>  SIGNIFICANT EVENTS: 4/25 intubated for EGD: see above findings 4/26: requiring neo gtt,  Am labs showing AKI, NAG metabolic acidosis & Hyperglycemia.   LINES/TUBES: 4/25 ET(anesthesia)>> 4/25 rt i j cvl>>  DISCUSSION: 35 yo heavy drinker with GIB and HD instabilty. S/p EGD but banding unsuccessful. + variceal bleeding. Hgb stable over night but trending down. Remains tachycardic w/ several metabolic derangements including: NAG metabolic acidosis, AKI and hyperkalemia. We will transfuse 1 unit, provide Vit K for coagulopathy, add bicarb gtt for NAG metabolic acidosis and add precedex for w/d prophylaxis and comfort on vent. Will trend blood work & clinical status. Hope that he has hit a plateau, possibly resume weaning later today OR in am once chemistries are headed the correct direction.   ASSESSMENT / PLAN:  PULMONARY A: Acute respiratory failure; Intubated for EGD for GIB and HD instability. P:   Full vent support for now PAD protocol No change in Ve  Resume weaning efforts after blood work stabilizes   CARDIOVASCULAR A:  Sinus tachycardia-->? W/d vs hypovolemia  Hemorrhagic  shock P:  Transfuse as needed Cont tele Correct acidosis  Wean Neo for MAP >65  RENAL A:   AKI NAG metabolic acidosis-->d/t hyperchloremia and volume resuscitation efforts.  Residual Lactic acidosis Hyperchloremia  HyperKalemia  P:   Bicarb gtt Serial chemistries Strict I&O  GASTROINTESTINAL A:   Hematemesis and hematochezia starting 4/23: variceal bleed s/p EGD 4/25, injected but not able to band Probable Cirrhosis  Hx of ETOH abuse up one fifth vodka P:   Octreotide drip PPI drip IF re-bleeds will need TIPS [place] abd  pending  HEMATOLOGIC A:   Acute Blood loss anemia from GIB Coagulopathy in setting of cirrhosis  P:  Transfuse 1 unit (4/26) Vit K 10 mg IV X 1 (4/26) Follow coags and CBC Avoid anticoagulants Transfuse to keep Hgb >8.0 with active bleed  INFECTIOUS A:   No overt infection, on IV abx empirically to cover presumed varices  P:   Ceftriaxone day 2 on 4/25 Follow cultures  ENDOCRINE A:   No acute issue  P:   ssi   NEUROLOGIC A:   Sedated on vent ETOH withdrawal , no hx of DT's but + sz x 1; improved as 4/26, but tachycardic  P:   RASS goal: -1 while intubated Monitor for ETOH wd, will need CIWA coverage Precedex gtt  ? Whether he will be willing to seek EtOH rehab   FAMILY  - Updates: No family at bedside  - Inter-disciplinary family meet or Palliative Care meeting due  by: May 1  Erick Colace ACNP-BC [place] Pager # [phone] OR # [phone] if no answer   08/22/2015, 9:23 AM  Attending Note:  I have examined patient, reviewed labs, studies and notes. I have discussed the case with Jerrye Bushy, and I agree with the data and plans as amended above. 35 yo alcoholic without documented cirrhosis, admitted 4/24 with GIB, hematemesis + melena, coagulopathy, mild transaminitis, lactic acidosis all consistent with hepatic dysfxn. Intubated 4/25 for instability and for EGD that  identified active variceal bleeding. On eval today he is calm, awake. He remains on phenylephrine. Labs reveal Acute renal injury with significant hyperkalemia and acidosis, continued progressive anemia concerning for persistent variceal bleeding. He will receive PRBC x 1 today. We will follow BMP, treat hyperK as indicated. Start bicarb for non-AG met acidosis. He is HIGH risk for EtOH withdrawal > will start precedex today. Hopefully he will be an extubation candidate soon. Independent critical care time is 40 minutes.   Baltazar Apo, MD, PhD 08/22/2015, 12:20 PM [place]  Pulmonary [facility] Critical Care [phone] or if no answer [phone]

## 2015-08-23 ENCOUNTER — Inpatient Hospital Stay (HOSPITAL_COMMUNITY): Payer: Medicaid Other

## 2015-08-23 DIAGNOSIS — K652 Spontaneous bacterial peritonitis: Secondary | ICD-10-CM | POA: Insufficient documentation

## 2015-08-23 DIAGNOSIS — J9601 Acute respiratory failure with hypoxia: Secondary | ICD-10-CM | POA: Insufficient documentation

## 2015-08-23 DIAGNOSIS — R9431 Abnormal electrocardiogram [ECG] [EKG]: Secondary | ICD-10-CM

## 2015-08-23 LAB — CBC
HEMATOCRIT: 24.2 % — AB (ref 39.0–52.0)
Hemoglobin: 7.9 g/dL — ABNORMAL LOW (ref 13.0–17.0)
MCH: 25.6 pg — ABNORMAL LOW (ref 26.0–34.0)
MCHC: 32.6 g/dL (ref 30.0–36.0)
MCV: 78.6 fL (ref 78.0–100.0)
Platelets: 310 10*3/uL (ref 150–400)
RBC: 3.08 MIL/uL — ABNORMAL LOW (ref 4.22–5.81)
RDW: 19.6 % — AB (ref 11.5–15.5)
WBC: 30.1 10*3/uL — ABNORMAL HIGH (ref 4.0–10.5)

## 2015-08-23 LAB — PROTIME-INR
INR: 2.32 — AB (ref 0.00–1.49)
PROTHROMBIN TIME: 25.3 s — AB (ref 11.6–15.2)

## 2015-08-23 LAB — BASIC METABOLIC PANEL
Anion gap: 7 (ref 5–15)
BUN: 8 mg/dL (ref 6–20)
CALCIUM: 5.7 mg/dL — AB (ref 8.9–10.3)
CHLORIDE: 111 mmol/L (ref 101–111)
CO2: 23 mmol/L (ref 22–32)
CREATININE: 1.16 mg/dL (ref 0.61–1.24)
GFR calc non Af Amer: >60 mL/min (ref >60)
Glucose, Bld: 158 mg/dL — ABNORMAL HIGH (ref 65–99)
Potassium: 3.4 mmol/L — ABNORMAL LOW (ref 3.5–5.1)
Sodium: 141 mmol/L (ref 135–145)

## 2015-08-23 LAB — ECHOCARDIOGRAM COMPLETE
Height: 67 in
Weight: 2677.27 oz

## 2015-08-23 LAB — TROPONIN I

## 2015-08-23 LAB — MAGNESIUM
MAGNESIUM: 0.8 mg/dL — AB (ref 1.7–2.4)
MAGNESIUM: 2 mg/dL (ref 1.7–2.4)

## 2015-08-23 MED ORDER — DEXMEDETOMIDINE HCL IN NACL 200 MCG/50ML IV SOLN
0.4000 ug/kg/h | INTRAVENOUS | Status: DC
  Administered 2015-08-23: 0.8 ug/kg/h via INTRAVENOUS
  Administered 2015-08-23: 0.9 ug/kg/h via INTRAVENOUS
  Administered 2015-08-23 – 2015-08-24 (×2): 0.4 ug/kg/h via INTRAVENOUS
  Administered 2015-08-24 (×3): 0.9 ug/kg/h via INTRAVENOUS
  Administered 2015-08-25: 0.4 ug/kg/h via INTRAVENOUS
  Filled 2015-08-23 (×8): qty 50

## 2015-08-23 MED ORDER — KCL-LACTATED RINGERS-D5W 20 MEQ/L IV SOLN
INTRAVENOUS | Status: DC
Start: 2015-08-23 — End: 2015-08-25
  Administered 2015-08-23 – 2015-08-24 (×2): via INTRAVENOUS
  Filled 2015-08-23 (×5): qty 1000

## 2015-08-23 MED ORDER — VITAMIN K1 10 MG/ML IJ SOLN
10.0000 mg | Freq: Once | INTRAVENOUS | Status: AC
  Administered 2015-08-23: 10 mg via INTRAVENOUS
  Filled 2015-08-23: qty 1

## 2015-08-23 MED ORDER — VITAMINS A & D EX OINT
TOPICAL_OINTMENT | CUTANEOUS | Status: AC
  Administered 2015-08-23: 1
  Filled 2015-08-23: qty 5

## 2015-08-23 MED ORDER — PANTOPRAZOLE SODIUM 40 MG IV SOLR
40.0000 mg | Freq: Two times a day (BID) | INTRAVENOUS | Status: DC
  Administered 2015-08-24 – 2015-08-30 (×13): 40 mg via INTRAVENOUS
  Filled 2015-08-23 (×13): qty 40

## 2015-08-23 MED ORDER — LORAZEPAM 2 MG/ML IJ SOLN
0.5000 mg | INTRAMUSCULAR | Status: DC | PRN
  Administered 2015-08-23 – 2015-08-25 (×8): 0.5 mg via INTRAVENOUS
  Filled 2015-08-23 (×9): qty 1

## 2015-08-23 MED ORDER — MAGNESIUM SULFATE 2 GM/50ML IV SOLN
2.0000 g | Freq: Once | INTRAVENOUS | Status: AC
  Administered 2015-08-23: 2 g via INTRAVENOUS
  Filled 2015-08-23: qty 50

## 2015-08-23 MED ORDER — SODIUM CHLORIDE 0.9 % IV SOLN
2.0000 g | Freq: Once | INTRAVENOUS | Status: AC
  Administered 2015-08-23: 2 g via INTRAVENOUS
  Filled 2015-08-23: qty 20

## 2015-08-23 MED ORDER — POTASSIUM CHLORIDE 10 MEQ/50ML IV SOLN
10.0000 meq | INTRAVENOUS | Status: AC
  Administered 2015-08-23 (×4): 10 meq via INTRAVENOUS
  Filled 2015-08-23 (×4): qty 50

## 2015-08-23 NOTE — Progress Notes (Signed)
CRITICAL VALUE ALERT  Critical value received:  Mg 0.8  Date of notification:  08/23/2015  Time of notification:  0408  Critical value read back:Yes.    Nurse who received alert:  Kevan Nyheryl Denny  MD notified (1st page):  Simonds  Time of first page:  0408  MD notified (2nd page):  Time of second page:  Responding MD: Sung AmabileSimonds  Time MD responded: [phone]

## 2015-08-23 NOTE — Progress Notes (Signed)
CSW consulted to assist with SA resources. PN reviewed. Pt extubated today. CSW will see pt on 08/24/15.   Cori RazorJamie Darlyne Schmiesing LCSW [phone]

## 2015-08-23 NOTE — Progress Notes (Signed)
  Echocardiogram 2D Echocardiogram has been performed.  Arvil ChacoFoster, Alitza Cowman 08/23/2015, 10:48 AM

## 2015-08-23 NOTE — Progress Notes (Signed)
Patient ID: James BalDaniel W Null, male   DOB: 10/23/1980, 35 y.o.   MRN: [id number] [facility]  James BalDaniel W Best 34 y.o. 10/23/1980   Subjective: Extubated by CCM just now. Abd Xray taken as well. Very lethargic. No bleeding overnight. Father at bedside.  Objective: Vital signs in last 24 hours: Filed Vitals:   08/23/15 0826 08/23/15 0841  BP: 102/57 87/48  Pulse: 94 96  Temp: 100 F (37.8 C) 99.9 F (37.7 C)  Resp: 16 18    Physical Exam: Gen: somnolent, no acute distress HEENT: anicteric sclera CV: RRR Chest: CTA B Abd: distended, decreased bowel sounds  Lab Results:  Recent Labs  08/21/15 0055  08/22/15 0450  08/22/15 1600 08/23/15 0325  NA  --   < > 140  < > 142 141  K  --   < > 6.7*  < > 5.3* 3.4*  CL  --   < > 117*  < > 117* 111  CO2  --   < > 15*  < > 20* 23  GLUCOSE  --   < > 107*  < > 148* 158*  BUN  --   < > 7  < > 8 8  CREATININE  --   < > 1.41*  < > 1.54* 1.16  CALCIUM  --   < > 5.3*  < > 5.9* 5.7*  MG 0.4*  < > 1.0*  --   --  0.8*  PHOS 2.7  --  1.9*  --   --   --   < > = values in this interval not displayed.  Recent Labs  08/20/15 2144 08/21/15 0505  AST 73* 49*  ALT 24 18  ALKPHOS 86 64  BILITOT 1.4* 2.0*  PROT 6.7 5.4*  ALBUMIN 2.2* 1.8*    Recent Labs  08/20/15 2144  08/22/15 1600 08/23/15 0325  WBC 9.5  < > 44.1* 30.1*  NEUTROABS 6.8  --   --   --   HGB 6.7*  < > 7.8* 7.9*  HCT 23.3*  < > 24.6* 24.2*  MCV 69.6*  < > 81.2 78.6  PLT 206  < > 328 310  < > = values in this interval not displayed.  Recent Labs  08/20/15 2144 08/22/15 0450  LABPROT 18.7* 24.2*  INR 1.56* 2.19*      Assessment/Plan: 35 yo s/p variceal bleed with sclerosal therapy done. S/P 1 U PRBCs yesterday with stable Hgb today of 7.9. Just now extubated. Xray negative for ileus. Continue Protonix drip and Octreotide drip today and if stable can stop Octreotide and change to IV PPI Q 12 hours tomorrow. Recheck INR. Keep NPO today.  Supportive care. Will follow.   Benny Deutschman C. 08/23/2015, 9:40 AM  Pager [phone]  If no answer or after 5 PM call [phone]

## 2015-08-23 NOTE — Progress Notes (Signed)
Patient extubated to 2 L [place]; O2 sats currently 98% on 2 L. Patient tolerated extubation well and no complications noted during procedure. RT will continue to monitor patient.

## 2015-08-23 NOTE — Plan of Care (Signed)
Problem: Phase II Progression Outcomes Goal: Date pt extubated/weaned off vent Outcome: Completed/Met Date Met:  08/23/15 Extubated 08/22/15 at 1000 to 2 liter [place]

## 2015-08-23 NOTE — Progress Notes (Addendum)
CRITICAL VALUE ALERT  Critical value received:  Ca 5.7  Date of notification:  08/23/2015  Time of notification:  0405  Critical value read back:Yes.    Nurse who received alert:  S. Rhianne Soman  MD notified (1st page):  Elink on call Sung Amabile(Simonds)  Time of first page:  0407  MD notified (2nd page):  Time of second page:  Responding MD:  Pola CornElink on call Sung Amabile(Simonds) Time MD responded:  [phone]

## 2015-08-23 NOTE — Progress Notes (Signed)
PULMONARY / CRITICAL CARE MEDICINE   Name: [name]: [id number] DOB: 1980/05/31    ADMISSION DATE:  08/20/2015 CONSULTATION DATE: 4/25  REFERRING MD:  [facility]  CHIEF COMPLAINT:  GIB  HISTORY OF PRESENT ILLNESS:   35 yo admitted after episode of hematemesis and hematochezia on 4/24 with continued blood loss till anemia was down to 6.8. He required electrolyte replacement for tetany with mg++, Ca++ and K+. PCCM called to place CVL but due to HD instability. On 4/25  he was intubated at GI request to facilitate EGD. Therefore PCCM will assume his care till stable. He drinks up to a fifth of vodka daily. Reports prior episode of bloody stools but did not seek medical attention. PCCM will assume his care for now.  SUBJECTIVE:  Awake, alert. No focal def  No further bleeding noted. Still on pressors > less Febrile  VITAL SIGNS: BP 87/48 mmHg  Pulse 96  Temp(Src) 99.9 F (37.7 C) (Core (Comment))  Resp 18  Ht  (1.702 m)  Wt 167 lb 5.3 oz (75.9 kg)  BMI 26.20 kg/m2  SpO2 100%  HEMODYNAMICS: CVP:  [14 mmHg-18 mmHg] 16 mmHg  VENTILATOR SETTINGS: Vent Mode:  [-] PRVC FiO2 (%):  [35 %-40 %] 35 % Set Rate:  [16 bmp] 16 bmp Vt Set:  [530 mL] 530 mL PEEP:  [5 cmH20] 5 cmH20 Plateau Pressure:  [23 cmH20-29 cmH20] 29 cmH20  INTAKE / OUTPUT: I/O last 3 completed shifts: In: 9597.1 [I.V.:8587.1; Blood:360; IV Piggyback:650] Out: 680 [Urine:679; Stool:1]  PHYSICAL EXAMINATION: General: WM awake and interactive on vent. He is not in distress. Currently OFF precedex infusion  Neuro:  Intact prior to intubation; follows commands currently w/out any evidence of focal def  HEENT:  ET-vent, NO JVD  Cardiovascular:  ST on tele (HR more controlled) Lungs:  CTA; equal rise on vent  Some crackles at bases Abdomen:  abd distended. Minimal ascites by bed side [place]. Hypoactive BS Musculoskeletal: Intact Skin:  Warm and dry . Icteric. Pale.   LABS:  BMET  Recent Labs Lab  08/22/15 1030 08/22/15 1600 08/23/15 0325  NA 140 142 141  K 6.0* 5.3* 3.4*  CL 117* 117* 111  CO2 13* 20* 23  BUN CREATININE 1.67* 1.54* 1.16  GLUCOSE 76 148* 158*    Electrolytes  Recent Labs Lab 08/21/15 0055 08/21/15 0505 08/22/15 0450 08/22/15 1030 08/22/15 1600 08/23/15 0325  CALCIUM  --  6.3* 5.3* 6.0* 5.9* 5.7*  MG 0.4* 1.6* 1.0*  --   --  0.8*  PHOS 2.7  --  1.9*  --   --   --     CBC  Recent Labs Lab 08/22/15 0600 08/22/15 1600 08/23/15 0325  WBC 46.1* 44.1* 30.1*  HGB 7.2* 7.8* 7.9*  HCT 22.7* 24.6* 24.2*  PLT 402* 328 310    Coag's  Recent Labs Lab 08/20/15 2144 08/22/15 0450  APTT 29  --   INR 1.56* 2.19*    Sepsis Markers  Recent Labs Lab 08/21/15 0055 08/21/15 0512 08/22/15 0450  LATICACIDVEN 2.3* 2.2* 4.1*    ABG  Recent Labs Lab 08/21/15 0830 08/22/15 0734  PHART 7.331* 7.235*  PCO2ART 35.8 32.6*  PO2ART 198* 133*    Liver Enzymes  Recent Labs Lab 08/20/15 2144 08/21/15 0505  AST 73* 49*  ALT 24 18  ALKPHOS 86 64  BILITOT 1.4* 2.0*  ALBUMIN 2.2* 1.8*    Cardiac Enzymes  Recent Labs Lab 08/22/15  1030 08/22/15 1600 08/22/15 2334  TROPONINI <0.03 <0.03 <0.03    Glucose No results for input(s): GLUCAP in the last 168 hours.  Imaging No results found.   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 Stigmata of recent bleeding were evident and red wale signs (nipple sign) were present. Active bleeding began during attempted banding but wall appeared to be scarred and could not successfully place a band on this area. Banding was unsuccessful despite six attempts with incomplete eradication of varices. Steady bleeding continued at the end of the procedure. These were successfully injected with 4 mL ethanolamine for hemostasis. 4/27 abd US>>> 4/27 ECHO>>>  CULTURES: 4/24  BCx2>>  ANTIBIOTICS: 4/24 roc>>  SIGNIFICANT EVENTS: 4/25 intubated for EGD: see above findings 4/26: requiring neo gtt,  Am labs showing AKI, NAG metabolic acidosis & Hyperglycemia.  4/27: AKI-->resolved; NAG metabolic acidosis-->d/t hyperchloremia and volume resuscitation efforts-->resolved; Residual Lactic acidosis-->resolved; Hyperchloremia --->resolved; HyperKalemia --->resolved -->bicarb gtt stopped. Hgb stable. SBT w/ plan to extubate   LINES/TUBES: 4/25 ET(anesthesia)>> 4/25 rt i j cvl>>  DISCUSSION: 35 yo heavy drinker with GIB and HD instabilty. S/p EGD but banding unsuccessful. + variceal bleeding. Hgb stable over night but trending down. Looks much better. All of his metabolic derangements including: NAG metabolic acidosis, AKI and hyperkalemia have resolved. He is awake, has acceptable F/vt on SBT. For today the plan will be: wean to extubate, wean O2, mobilize, add pulm hygiene measures. D/C bicarb gtt, give another dose of Vit K. And assess weather or not he can have clears as his Hgb has been stable for almost 24hrs. Will order US of abd. Think he does have Cirrhosis. Father tells me he has a h/o fatty liver disease in addition his ETOH abuse.   ASSESSMENT / PLAN:  PULMONARY A: Acute respiratory failure; Intubated for EGD for GIB and HD instability. P:   SBT w/ hope to extubate Wean FIO2 Mobilize  Asp precautions   CARDIOVASCULAR A:  Sinus tachycardia-->? W/d vs hypovolemia  Hemorrhagic  Shock-->resolved residual hypotension in setting of Precedex and still on low dose pressors. Suspect that this will come off once he is off vent.  P:  Cont tele KVO IVFs Wean Neo for MAP >65  RENAL A:   Hypokalemia and Hypomagnesemia Auto-diuresis s/p AKI-->now w/ Cr normalized.  P:   Dc Bicarb gtt; change to D5LR 20 Kcl @ 50  De-escalate Serial chemistries Strict I&O--> avoid excess water loss in setting of recovering renal fxn   GASTROINTESTINAL A:   Hematemesis and  hematochezia starting 4/23: variceal bleed s/p EGD 4/25, injected but not able to band Probable Cirrhosis has mild ascites by bedside US on 4/27 Hx of ETOH abuse up one fifth vodka R/o ileus 4/27  P:   Octreotide drip PPI drip IF re-bleeds will need TIPS 1-view abd  US abd pending Will defer diet progression to GI  HEMATOLOGIC A:   Acute Blood loss anemia from GIB Coagulopathy in setting of cirrhosis (likely) ->s/p one more unit PRBC on 4/26; also vit K P:  Follow coags and CBC in am 5/28 Avoid anticoagulants Transfuse to keep Hgb > 7 now that stable   INFECTIOUS A:   No overt infection, on IV abx empirically to cover presumed varices  P:   Ceftriaxone day 4 on 4/27 Follow cultures  ENDOCRINE A:   No acute issue  P:  ssi   NEUROLOGIC A:   Sedated on vent ETOH withdrawal , no hx of DT's but + sz x 1; improved as 4/26, but tachycardic  P:   RASS goal: 0 Monitor for ETOH wd, will need CIWA coverage Precedex gtt -->dc  May need precedex drip if extubated for withdrawal.  ? Whether he will be willing to seek EtOH rehab-->father wants this    FAMILY  - Updates: Father updated at bedside. Mentioned about poor prognosis with a diagnosis of cirrhosis. Plan to do SBT, hopefully extubate today or tomorrow.   - Inter-disciplinary family meet or Palliative Care meeting due by: May 1  Simonne Martinet ACNP-BC [facility] Pulmonary/Critical Care Pager # [phone] OR # [phone] if no answer   08/23/2015, 8:47 AM  ATTENDING NOTE / ATTESTATION NOTE :   I have discussed the case with the resident/APP  Anders Simmonds  I agree with the resident/APP's  history, physical examination, assessment, and plans.  I have edited the above note and modified it according to our agreed history, physical examination, assessment and plan.   I have spent 31 minutes of critical care time with this patient today.    Pollie Meyer, MD 08/23/2015, 9:37 AM  Pulmonary and Critical  Care Pager [phone] After 3 pm or if no answer, call [phone]

## 2015-08-24 ENCOUNTER — Inpatient Hospital Stay (HOSPITAL_COMMUNITY): Payer: Medicaid Other

## 2015-08-24 DIAGNOSIS — K7030 Alcoholic cirrhosis of liver without ascites: Secondary | ICD-10-CM | POA: Insufficient documentation

## 2015-08-24 DIAGNOSIS — K7031 Alcoholic cirrhosis of liver with ascites: Secondary | ICD-10-CM | POA: Insufficient documentation

## 2015-08-24 LAB — TYPE AND SCREEN
ABO/RH(D): O NEG
Antibody Screen: NEGATIVE
UNIT DIVISION: 0
UNIT DIVISION: 0
UNIT DIVISION: 0
UNIT DIVISION: 0
Unit division: 0
Unit division: 0
Unit division: 0

## 2015-08-24 LAB — CBC
HCT: 25.2 % — ABNORMAL LOW (ref 39.0–52.0)
HEMOGLOBIN: 7.9 g/dL — AB (ref 13.0–17.0)
MCH: 25.9 pg — ABNORMAL LOW (ref 26.0–34.0)
MCHC: 31.3 g/dL (ref 30.0–36.0)
MCV: 82.6 fL (ref 78.0–100.0)
Platelets: 273 10*3/uL (ref 150–400)
RBC: 3.05 MIL/uL — AB (ref 4.22–5.81)
RDW: 22.3 % — ABNORMAL HIGH (ref 11.5–15.5)
WBC: 19.9 10*3/uL — AB (ref 4.0–10.5)

## 2015-08-24 LAB — COMPREHENSIVE METABOLIC PANEL
ALT: 504 U/L — ABNORMAL HIGH (ref 17–63)
AST: 1406 U/L — AB (ref 15–41)
Albumin: 1.5 g/dL — ABNORMAL LOW (ref 3.5–5.0)
Alkaline Phosphatase: 66 U/L (ref 38–126)
Anion gap: 7 (ref 5–15)
BILIRUBIN TOTAL: 5.6 mg/dL — AB (ref 0.3–1.2)
BUN: 7 mg/dL (ref 6–20)
CO2: 23 mmol/L (ref 22–32)
Calcium: 6.1 mg/dL — CL (ref 8.9–10.3)
Chloride: 109 mmol/L (ref 101–111)
Creatinine, Ser: 0.92 mg/dL (ref 0.61–1.24)
GFR calc Af Amer: >60 mL/min (ref >60)
Glucose, Bld: 115 mg/dL — ABNORMAL HIGH (ref 65–99)
POTASSIUM: 4.2 mmol/L (ref 3.5–5.1)
Sodium: 139 mmol/L (ref 135–145)
TOTAL PROTEIN: 5 g/dL — AB (ref 6.5–8.1)

## 2015-08-24 LAB — PROTIME-INR
INR: 1.94 — ABNORMAL HIGH (ref 0.00–1.49)
PROTHROMBIN TIME: 22.1 s — AB (ref 11.6–15.2)

## 2015-08-24 LAB — CORTISOL: Cortisol, Plasma: 5.9 ug/dL

## 2015-08-24 LAB — PHOSPHORUS: PHOSPHORUS: 1.6 mg/dL — AB (ref 2.5–4.6)

## 2015-08-24 LAB — AMMONIA: Ammonia: 79 umol/L — ABNORMAL HIGH (ref 9–35)

## 2015-08-24 LAB — MAGNESIUM: Magnesium: 1.7 mg/dL (ref 1.7–2.4)

## 2015-08-24 MED ORDER — POTASSIUM PHOSPHATES 15 MMOLE/5ML IV SOLN: 30.0000 mmol | Freq: Once | INTRAVENOUS | Status: DC

## 2015-08-24 MED ORDER — LIP MEDEX EX OINT
TOPICAL_OINTMENT | CUTANEOUS | Status: DC | PRN
  Filled 2015-08-24: qty 7

## 2015-08-24 MED ORDER — CALCIUM GLUCONATE 10 % IV SOLN
1.0000 g | Freq: Once | INTRAVENOUS | Status: AC
  Administered 2015-08-24: 1 g via INTRAVENOUS
  Filled 2015-08-24: qty 10

## 2015-08-24 MED ORDER — VITAMIN K1 10 MG/ML IJ SOLN
10.0000 mg | Freq: Once | INTRAVENOUS | Status: AC
  Administered 2015-08-24: 10 mg via INTRAVENOUS
  Filled 2015-08-24: qty 1

## 2015-08-24 MED ORDER — SODIUM PHOSPHATES 45 MMOLE/15ML IV SOLN
30.0000 mmol | Freq: Once | INTRAVENOUS | Status: AC
  Administered 2015-08-24: 30 mmol via INTRAVENOUS
  Filled 2015-08-24: qty 10

## 2015-08-24 NOTE — Progress Notes (Signed)
Initial Nutrition Assessment  DOCUMENTATION CODES:   Not applicable  INTERVENTION:  -Diet advancement per MD -RD continue to monitor  NUTRITION DIAGNOSIS:   Inadequate oral intake related to inability to eat as evidenced by NPO status. ongoing  GOAL:   Patient will meet greater than or equal to 90% of their needs Not meeting  MONITOR:   I & O's, Labs, Vent status, Weight trends  REASON FOR ASSESSMENT:   Ventilator    ASSESSMENT:   [name] is an 35 y.o. white male who presented with the abrupt onset of hematemesis this evening with orthostatic dizziness and also noticed a bloody stool. Had a previous normal bowel movement 2 hours earlier. He denies any pain. He is alert and oriented and has not vomited since reaching the emergency room.  Mr. [name] continues to be NPO - mentation poor.  Shock has resolved. Per MD note, will advanced to CL today if mentation improves Will continue to follow for diet advancement - follow up for PO intake as advanced.  Medications: D5 @ 5950mL/hr --> 204 calories, Neo drip; B1 Injections; Phosphate Runs, KCL runs; Foliac Acid Injection; Vitamin K runs;  Labs: Phos 1.6;  Diet Order:     Skin:  Reviewed, no issues  Last BM:  4/26  Height:   Ht Readings from Last 1 Encounters:  08/21/15 5\' 7"  (1.702 m)    Weight:   Wt Readings from Last 1 Encounters:  08/22/15 167 lb 5.3 oz (75.9 kg)    Ideal Body Weight:  67.27 kg  BMI:  Body mass index is 26.2 kg/(m^2).  Estimated Nutritional Needs:   Kcal:  1928  Protein:  70-90 grams  Fluid:  >/= 1.9L  EDUCATION NEEDS:   No education needs identified at this time  James AnoWilliam M. Bennie Chirico, MS, RD LDN After Hours/Weekend Pager [phone]

## 2015-08-24 NOTE — Progress Notes (Signed)
135mL of Fentanyl drip wasted in the sink with Rutha BouchardShelby Odom, RN as witness. Amount entered into Pyxis, as well. Bosie HelperS. Cashlyn Huguley, RN

## 2015-08-24 NOTE — Progress Notes (Signed)
CSW attempted to see pt this afternoon but care was being provided at the time of visit. PN reviewed.  MD notes, pt " somnolent, on sedation." CSW will continue to follow to assist with SA resources ( when medically appropriate ).  Cori RazorJamie Jorja Empie LCSW [phone]

## 2015-08-24 NOTE — Progress Notes (Signed)
CRITICAL VALUE ALERT  Critical value received:  Calcium 6.1  Date of notification:  08/24/2015  Time of notification:  0620  Critical value read back:Yes.    Nurse who received alert:  Bosie HelperS. Ty Buntrock, RN  MD notified (1st page):  Elink on call  Time of first page:  0627  MD notified (2nd page):  Time of second page:  Responding MD:  Pola CornElink on call  Time MD responded:  [phone]

## 2015-08-24 NOTE — Progress Notes (Signed)
eLink Physician-Brief Progress Note Patient Name: James BalDaniel W Best DOB: 1980-06-09 MRN: [id number]   Date of Service  08/24/2015  HPI/Events of Note  Hypocalcemia  eICU Interventions  Calcium replaced     Intervention Category Intermediate Interventions: Electrolyte abnormality - evaluation and management  [name] 08/24/2015, 6:46 AM

## 2015-08-24 NOTE — Progress Notes (Signed)
PULMONARY / CRITICAL CARE MEDICINE   Name: [name]: [id number] DOB: 03-Mar-1981    ADMISSION DATE:  08/20/2015 CONSULTATION DATE: 4/25  REFERRING MD:  [facility]ad  CHIEF COMPLAINT:  GIB  HISTORY OF PRESENT ILLNESS:   35 yo admitted after episode of hematemesis and hematochezia on 4/24 with continued blood loss till anemia was down to 6.8. He required electrolyte replacement for tetany with mg++, Ca++ and K+. PCCM called to place CVL but due to HD instability. On 4/25  he was intubated at GI request to facilitate EGD. Therefore PCCM will assume his care till stable. He drinks up to a fifth of vodka daily. Reports prior episode of bloody stools but did not seek medical attention. PCCM will assume his care for now.  SUBJECTIVE:  Slow to wake up. Extubated on 4/27 >> agitated so was placed on precedex drip. Drowsy this am.   VITAL SIGNS: BP 93/66 mmHg  Pulse 80  Temp(Src) 97.3 F (36.3 C) (Axillary)  Resp 18  Ht  (1.702 m)  Wt 167 lb 5.3 oz (75.9 kg)  BMI 26.20 kg/m2  SpO2 100%  HEMODYNAMICS: CVP:  [15 mmHg-16 mmHg] 15 mmHg  VENTILATOR SETTINGS:    INTAKE / OUTPUT: I/O last 3 completed shifts: In: 6921 [I.V.:6351; IV Piggyback:570] Out: 885 [Urine:885]  PHYSICAL EXAMINATION: General: WM awake, no distress. Slow to arouse but weaning precedex  Neuro:  Awake but slow arouse on precedex. No focal def  HEENT:  MMM, NO JVD; Right IJ dressing intact  Cardiovascular:  ST on tele (HR more controlled) Lungs:  occ rhonchi, no accessory use Abdomen:  abd distended. Minimal ascites by bed side [place]. Hypoactive BS Musculoskeletal: Intact Skin:  Warm and dry . Icteric. Pale.   LABS:  BMET  Recent Labs Lab 08/22/15 1600 08/23/15 0325 08/24/15 0500  NA 142 141 139  K 5.3* 3.4* 4.2  CL 117* 111 109  CO2 20* 23 23  BUN CREATININE 1.54* 1.16 0.92  GLUCOSE 148* 158* 115*    Electrolytes  Recent Labs Lab 08/21/15 0055  08/22/15 0450  08/22/15 1600  08/23/15 0325 08/23/15 1110 08/24/15 0500  CALCIUM  --   < > 5.3*  < > 5.9* 5.7*  --  6.1*  MG 0.4*  < > 1.0*  --   --  0.8* 2.0 1.7  PHOS 2.7  --  1.9*  --   --   --   --  1.6*  < > = values in this interval not displayed.  CBC  Recent Labs Lab 08/22/15 1600 08/23/15 0325 08/24/15 0500  WBC 44.1* 30.1* 19.9*  HGB 7.8* 7.9* 7.9*  HCT 24.6* 24.2* 25.2*  PLT 328 310 273    Coag's  Recent Labs Lab 08/20/15 2144 08/22/15 0450 08/23/15 1110 08/24/15 0500  APTT 29  --   --   --   INR 1.56* 2.19* 2.32* 1.94*    Sepsis Markers  Recent Labs Lab 08/21/15 0055 08/21/15 0512 08/22/15 0450  LATICACIDVEN 2.3* 2.2* 4.1*    ABG  Recent Labs Lab 08/21/15 0830 08/22/15 0734  PHART 7.331* 7.235*  PCO2ART 35.8 32.6*  PO2ART 198* 133*    Liver Enzymes  Recent Labs Lab 08/20/15 2144 08/21/15 0505 08/24/15 0500  AST 73* 49* 1406*  ALT 24 18 504*  ALKPHOS 86 64 66  BILITOT 1.4* 2.0* 5.6*  ALBUMIN 2.2* 1.8* 1.5*    Cardiac Enzymes  Recent Labs Lab 08/22/15 1030 08/22/15 1600 08/22/15  2334  TROPONINI <0.03 <0.03 <0.03    Glucose No results for input(s): GLUCAP in the last 168 hours.  Imaging Dg Abd Portable 1v  08/23/2015  CLINICAL DATA:  Ileus EXAM: PORTABLE ABDOMEN - 1 VIEW COMPARISON:  CT abdomen pelvis dated 09/18/2011 FINDINGS: Nonobstructive bowel gas pattern. Visualized osseous structures are within normal limits. IMPRESSION: Unremarkable abdominal radiograph. Electronically Signed   By: Charline BillsSriyesh  Krishnan M.D.   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 Stigmata of recent bleeding were evident and red wale signs (nipple sign) were present. Active bleeding began during attempted banding but wall appeared to be scarred and could not successfully place a band on this area. Banding was unsuccessful despite six  attempts with incomplete eradication of varices. Steady bleeding continued at the end of the procedure. These were successfully injected with 4 mL ethanolamine for hemostasis. 4/27 abd US>>> 4/27 ECHO:Left ventricle: The cavity size was normal. Systolic function was normal. The estimated ejection fraction was in the range of 55% to 60%. Wall motion was normal; there were no regional wall motion abnormalities. The transmitral flow pattern was normal. The deceleration time of the early transmitral flow velocity was normal. The pulmonary vein flow pattern was normal. The tissue Doppler parameters were normal. Left ventricular diastolic function parameters were normal.  CULTURES: 4/24 BCx2>> (-)  ANTIBIOTICS: 4/24 roc>>  SIGNIFICANT EVENTS: 4/25 intubated for EGD: see above findings 4/26: requiring neo gtt,  Am labs showing AKI, NAG metabolic acidosis & Hyperglycemia.  4/27: AKI-->resolved; NAG metabolic acidosis-->d/t hyperchloremia and volume resuscitation efforts-->resolved; Residual Lactic acidosis-->resolved; Hyperchloremia --->resolved; HyperKalemia --->resolved -->bicarb gtt stopped. Hgb stable. Extubated 4/28 still w/ sig delirium. Still pressor dependent. LFTs increased. US pending. Changed PPI to BID and octreotide stopped.   LINES/TUBES: 4/25 ET(anesthesia)>>4/27 4/25 rt i j cvl>>  DISCUSSION: 35 yo heavy drinker with GIB and HD instabilty. S/p EGD but banding unsuccessful. + variceal bleeding. . All of his metabolic derangements including: NAG metabolic acidosis, AKI and hyperkalemia have resolved. He is stable from an electrolyte and cbc stand-point. Think he does have Cirrhosis. Father tells us he has a h/o fatty liver disease in addition his ETOH abuse. Not sure why his LFTs have bumped so acutely. US of abd to happen today. Still on pressors. Plan for 4/28: wean off precedex (if able), stop octreotide, change PPI to BID, mobilize, ck ammonia (given delirium), check cortisol (due to  on-going neo dependence). His father was updated at length. Spoke w/ GI at the bedside. We will start clears later today IF his MS allows.   ASSESSMENT / PLAN:  PULMONARY A: Acute respiratory failure; Intubated for EGD for GIB and HD instability-->extubated 4/27 P:   Wean FIO2 Mobilize  Asp precautions  WOF worsening mental status > may need to be intubated but he is protecting airway now.   CARDIOVASCULAR A:  Sinus tachycardia-->? W/d vs hypovolemia  Hemorrhagic  Shock-->resolved residual hypotension in setting of Precedex and still on low dose pressors. Suspect that this will come off once he is off sedation  P:  Cont tele KVO IVFs Wean Neo for MAP >65 Will check cortisol for completeness   RENAL A:   Hypokalemia and Hypomagnesemia Auto-diuresis s/p AKI-->now w/ Cr normalized.  Hypophosphatemia  P:   Cont current IVFs D5LR at 50  Replace PO4 Repeat am chemistry  Strict  I&O--> avoid excess water loss in setting of recovering renal fxn   GASTROINTESTINAL A:   Hematemesis and hematochezia starting 4/23: variceal bleed s/p EGD 4/25, injected but not able to band Probable Cirrhosis has mild ascites by bedside US on 4/27 Hx of ETOH abuse up one fifth vodka His LFTs have bumps significantly  Likely hepatic encephalopathy  P:   Octreotide drip-->d/c 4/28 PPI to BID  IF re-bleeds will need TIPS 1-view abd  [place] abd pending GI following  Clear liq diet to start 4/28 Observe LFTs.  Check ammonia > may need lactulose PO or PR  HEMATOLOGIC A:   Acute Blood loss anemia from GIB Coagulopathy in setting of cirrhosis (likely) ->s/p one more unit PRBC on 4/26; also vit K P:  Follow coags and CBC in am 5/29 Avoid anticoagulants Transfuse to keep Hgb > 7 now that stable   INFECTIOUS A:   No overt infection, on IV abx empirically to cover presumed varices  P:   Ceftriaxone day 5 on 4/28 Follow cultures  ENDOCRINE A:   No acute issue  P:   ssi   NEUROLOGIC A:    Sedated on vent ETOH withdrawal , no hx of DT's but + sz x 1; improved as 4/26, but tachycardic  Encephalopahty prob 2/2 hepatic enceph/precedex P:   RASS goal: 0 Monitor for ETOH wd, will need CIWA coverage Precedex gtt --> aim to wean off if able Ck ammonia level > may need lactulose if elevated.  ? Whether he will be willing to seek EtOH rehab-->father wants this    FAMILY  - Updates: Father updated at bedside. Mentioned about poor prognosis with a diagnosis of cirrhosis.  - Inter-disciplinary family meet or Palliative Care meeting due by: May 1  Simonne Martinet ACNP-BC [facility] Pulmonary/Critical Care Pager # [phone] OR # [phone] if no answer   08/24/2015, 9:01 AM   ATTENDING NOTE / ATTESTATION NOTE :   I have discussed the case with the resident/APP  Anders Simmonds.  I agree with the resident/APP's  history, physical examination, assessment, and plans.  I have edited the above note and modified it according to our agreed history, physical examination, assessment and plan.    Family :No family at bedside when I rounded.   I spent  31  minutes of Critical Care time with this patient today.      Pollie Meyer, MD 08/24/2015, 9:57 AM [place] Pulmonary and Critical Care Pager [phone] After 3 pm or if no answer, call [phone]

## 2015-08-24 NOTE — Progress Notes (Signed)
Subjective: Somnolent, on sedation. No blood in stool or hematemesis.  Objective: Vital signs in last 24 hours: Temp:  [97.2 F (36.2 C)-99.3 F (37.4 C)] 97.2 F (36.2 C) (04/28 0930) Pulse Rate:  [27-105] 81 (04/28 0930) Resp:  [14-38] 26 (04/28 0930) BP: (67-118)/(41-83) 98/69 mmHg (04/28 0930) SpO2:  [69 %-100 %] 100 % (04/28 0930) Weight change:  Last BM Date: 08/23/15  PE: GEN:  Extubated, somnolent (on Precedex) ABD:  Distended, tympanic EXT:  Tattoos, edematous  Lab Results: CBC    Component Value Date/Time   WBC 19.9* 08/24/2015 0500   RBC 3.05* 08/24/2015 0500   HGB 7.9* 08/24/2015 0500   HCT 25.2* 08/24/2015 0500   PLT 273 08/24/2015 0500   MCV 82.6 08/24/2015 0500   MCH 25.9* 08/24/2015 0500   MCHC 31.3 08/24/2015 0500   RDW 22.3* 08/24/2015 0500   LYMPHSABS 1.0 08/20/2015 2144   MONOABS 1.5* 08/20/2015 2144   EOSABS 0.1 08/20/2015 2144   BASOSABS 0.1 08/20/2015 2144   CMP     Component Value Date/Time   NA 139 08/24/2015 0500   K 4.2 08/24/2015 0500   CL 109 08/24/2015 0500   CO2 23 08/24/2015 0500   GLUCOSE 115* 08/24/2015 0500   BUN 7 08/24/2015 0500   CREATININE 0.92 08/24/2015 0500   CALCIUM 6.1* 08/24/2015 0500   PROT 5.0* 08/24/2015 0500   ALBUMIN 1.5* 08/24/2015 0500   AST 1406* 08/24/2015 0500   ALT 504* 08/24/2015 0500   ALKPHOS 66 08/24/2015 0500   BILITOT 5.6* 08/24/2015 0500   GFRNONAA >60 08/24/2015 0500   GFRAA >60 08/24/2015 0500   Assessment:  1.  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  No evidence of ongoing bleeding.  INR currently 1.94. 2.  Acute blood loss anemia. 3.  Abdominal distention.  Possible intramural abdominal wall edema and ileus; bedside ultrasound done by PCCM team. 4.  Hypotension, on pressor agents, no evidence of ongoing bleeding. 5.  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 6.  Elevated LFTs.  Multifactorial  (background of cirrhosis, but current degree of elevation likely predominantly component of shock liver). 7.  Somnolence.  Sedation versus hepatic encephalopathy versus component of both.  Plan:  1.  Pantoprazole 40 mg IV Q 12 hours. 2.  Weaning sedation, and see how mental status responds; if patient remains somnolent tomorrow after being off sedation, might need to consider lactulose enemas. 3.  Since there is no evidence of ongoing bleeding, would go ahead and discontinue octreotide drip. 4.  Follow LFTs and clinical course. 5.  Patient is not a liver transplant candidate at least for the next 6 months, given active alcohol abuse. 6.  Prognosis guarded. 7.  [facility] will follow. 8.  Case discussed at length with patient's father as well as PCCM team.   Freddy JakschUTLAW,James Thrun M 08/24/2015, 10:39 AM   Pager [phone] If no answer or after 5 PM call [phone]

## 2015-08-24 NOTE — Progress Notes (Signed)
Date:  August 24, 2015 Chart reviewed for concurrent status and case management needs. Will continue to follow patient for changes and needs:  1 unit of prbc on [id number]/ hypotensive and hgb on [id number] remains at 7.9. Marcelle Smilinghonda Samayra Hebel, BSN, [place] SquareRN3, [place]CCM   [phone]

## 2015-08-25 DIAGNOSIS — K7031 Alcoholic cirrhosis of liver with ascites: Secondary | ICD-10-CM

## 2015-08-25 LAB — CBC
HCT: 24.5 % — ABNORMAL LOW (ref 39.0–52.0)
HEMOGLOBIN: 7.7 g/dL — AB (ref 13.0–17.0)
MCH: 26.4 pg (ref 26.0–34.0)
MCHC: 31.4 g/dL (ref 30.0–36.0)
MCV: 83.9 fL (ref 78.0–100.0)
Platelets: 227 10*3/uL (ref 150–400)
RBC: 2.92 MIL/uL — AB (ref 4.22–5.81)
RDW: 22.9 % — ABNORMAL HIGH (ref 11.5–15.5)
WBC: 13.7 10*3/uL — AB (ref 4.0–10.5)

## 2015-08-25 LAB — CULTURE, BLOOD (ROUTINE X 2)
CULTURE: NO GROWTH
Culture: NO GROWTH

## 2015-08-25 LAB — COMPREHENSIVE METABOLIC PANEL
ALK PHOS: 64 U/L (ref 38–126)
ALT: 356 U/L — AB (ref 17–63)
AST: 825 U/L — AB (ref 15–41)
Albumin: 1.5 g/dL — ABNORMAL LOW (ref 3.5–5.0)
Anion gap: 5 (ref 5–15)
BUN: 8 mg/dL (ref 6–20)
CALCIUM: 6.4 mg/dL — AB (ref 8.9–10.3)
CHLORIDE: 111 mmol/L (ref 101–111)
CO2: 24 mmol/L (ref 22–32)
CREATININE: 0.83 mg/dL (ref 0.61–1.24)
Glucose, Bld: 109 mg/dL — ABNORMAL HIGH (ref 65–99)
Potassium: 4.6 mmol/L (ref 3.5–5.1)
SODIUM: 140 mmol/L (ref 135–145)
Total Bilirubin: 6.6 mg/dL — ABNORMAL HIGH (ref 0.3–1.2)
Total Protein: 5 g/dL — ABNORMAL LOW (ref 6.5–8.1)

## 2015-08-25 LAB — PHOSPHORUS: Phosphorus: 2.1 mg/dL — ABNORMAL LOW (ref 2.5–4.6)

## 2015-08-25 LAB — LACTIC ACID, PLASMA: Lactic Acid, Venous: 1.8 mmol/L (ref 0.5–2.0)

## 2015-08-25 MED ORDER — DEXMEDETOMIDINE HCL IN NACL 400 MCG/100ML IV SOLN
0.2000 ug/kg/h | INTRAVENOUS | Status: AC
  Administered 2015-08-25: 0.3 ug/kg/h via INTRAVENOUS
  Administered 2015-08-25: 0.2 ug/kg/h via INTRAVENOUS
  Administered 2015-08-26: 0.5 ug/kg/h via INTRAVENOUS
  Administered 2015-08-26 (×2): 0.7 ug/kg/h via INTRAVENOUS
  Administered 2015-08-26: 0.5 ug/kg/h via INTRAVENOUS
  Administered 2015-08-26: 1.3 ug/kg/h via INTRAVENOUS
  Administered 2015-08-26: 0.7 ug/kg/h via INTRAVENOUS
  Administered 2015-08-27 (×2): 1.6 ug/kg/h via INTRAVENOUS
  Filled 2015-08-25 (×2): qty 50
  Filled 2015-08-25: qty 100
  Filled 2015-08-25 (×4): qty 50
  Filled 2015-08-25: qty 100
  Filled 2015-08-25 (×3): qty 50

## 2015-08-25 MED ORDER — ALBUMIN HUMAN 25 % IV SOLN
50.0000 g | Freq: Once | INTRAVENOUS | Status: AC
  Administered 2015-08-25: 50 g via INTRAVENOUS
  Filled 2015-08-25: qty 50

## 2015-08-25 MED ORDER — ALBUMIN HUMAN 25 % IV SOLN
INTRAVENOUS | Status: AC
  Filled 2015-08-25: qty 150

## 2015-08-25 MED ORDER — POTASSIUM & SODIUM PHOSPHATES 280-160-250 MG PO PACK
1.0000 | PACK | Freq: Three times a day (TID) | ORAL | Status: AC
  Administered 2015-08-25 – 2015-08-26 (×7): 1 via ORAL
  Filled 2015-08-25 (×8): qty 1

## 2015-08-25 MED ORDER — KCL IN DEXTROSE-NACL 10-5-0.45 MEQ/L-%-% IV SOLN
INTRAVENOUS | Status: DC
  Administered 2015-08-25 – 2015-08-26 (×2): via INTRAVENOUS
  Filled 2015-08-25 (×4): qty 1000

## 2015-08-25 MED ORDER — SODIUM CHLORIDE 0.9 % IV BOLUS (SEPSIS)
500.0000 mL | Freq: Once | INTRAVENOUS | Status: AC
  Administered 2015-08-25: 500 mL via INTRAVENOUS

## 2015-08-25 MED ORDER — LACTULOSE ENEMA
300.0000 mL | Freq: Every day | ORAL | Status: DC | PRN
  Administered 2015-08-27: 300 mL via RECTAL
  Filled 2015-08-25 (×3): qty 300

## 2015-08-25 MED ORDER — LACTULOSE 10 GM/15ML PO SOLN
30.0000 g | Freq: Three times a day (TID) | ORAL | Status: DC
  Administered 2015-08-25 – 2015-08-26 (×6): 30 g via ORAL
  Filled 2015-08-25 (×7): qty 45

## 2015-08-25 NOTE — Progress Notes (Signed)
Patient had an agitated episode stating he "had to pee" and attempting to remove foley. Bladder scanned patient and there was 400 on scan. Foley pulled back with no results. Patient severely agitated attempting to pull out foley and remove equipment. Foley removed and two RNs attempted both a 14 and 16 french foley. Patient still agitated and crying out. Foley removed and MD notified.

## 2015-08-25 NOTE — Progress Notes (Signed)
[facility] Progress Note  Subjective: Patient with alcoholic cirrhosis of the liver status post variceal bleed. Also status post sclerotherapy of esophageal varices. No signs of bleeding overnight.  Objective: Vital signs in last 24 hours: Temp:  [95.7 F (35.4 C)-97.6 F (36.4 C)] 97 F (36.1 C) (04/29 1030) Pulse Rate:  [69-112] 91 (04/29 1030) Resp:  [15-34] 34 (04/29 1030) BP: (72-126)/(42-75) 114/66 mmHg (04/29 1030) SpO2:  [97 %-100 %] 100 % (04/29 1030) Weight change:    PE:  Lethargic  Heart regular rhythm  Lungs clear  Abdomen soft and nontender  Lab Results: Results for orders placed or performed during the hospital encounter of 08/20/15 (from the past 24 hour(s))  Phosphorus     Status: Abnormal   Collection Time: 08/25/15  6:20 AM  Result Value Ref Range   Phosphorus 2.1 (L) 2.5 - 4.6 mg/dL  CBC     Status: Abnormal   Collection Time: 08/25/15  6:20 AM  Result Value Ref Range   WBC 13.7 (H) 4.0 - 10.5 K/uL   RBC 2.92 (L) 4.22 - 5.81 MIL/uL   Hemoglobin 7.7 (L) 13.0 - 17.0 g/dL   HCT 16.1 (L) 09.6 - 04.5 %   MCV 83.9 78.0 - 100.0 fL   MCH 26.4 26.0 - 34.0 pg   MCHC 31.4 30.0 - 36.0 g/dL   RDW 40.9 (H) 81.1 - 91.4 %   Platelets 227 150 - 400 K/uL  Comprehensive metabolic panel     Status: Abnormal   Collection Time: 08/25/15  6:20 AM  Result Value Ref Range   Sodium 140 135 - 145 mmol/L   Potassium 4.6 3.5 - 5.1 mmol/L   Chloride 111 101 - 111 mmol/L   CO2 24 22 - 32 mmol/L   Glucose, Bld 109 (H) 65 - 99 mg/dL   BUN 8 6 - 20 mg/dL   Creatinine, Ser 7.82 0.61 - 1.24 mg/dL   Calcium 6.4 (LL) 8.9 - 10.3 mg/dL   Total Protein 5.0 (L) 6.5 - 8.1 g/dL   Albumin 1.5 (L) 3.5 - 5.0 g/dL   AST 956 (H) 15 - 41 U/L   ALT 356 (H) 17 - 63 U/L   Alkaline Phosphatase 64 38 - 126 U/L   Total Bilirubin 6.6 (H) 0.3 - 1.2 mg/dL   GFR calc non Af Amer >60 >60 mL/min   GFR calc Af Amer >60 >60 mL/min   Anion gap 5 5 - 15  Lactic acid, plasma      Status: None   Collection Time: 08/25/15 10:00 AM  Result Value Ref Range   Lactic Acid, Venous 1.8 0.5 - 2.0 mmol/L    Studies/Results: US Abdomen Port  08/24/2015  CLINICAL DATA:  Abdominal distention. Elevated liver function tests. Alcohol abuse. EXAM: ABDOMEN ULTRASOUND COMPLETE COMPARISON:  None. FINDINGS: Gallbladder: Diffuse wall thickening measuring up to 5.7 mm. Minimal pericholecystic fluid. No visible gallstones. The patient was not focally tender over the gallbladder. Common bile duct: Diameter: 4.1 mm Liver: Diffusely echogenic with lobular contours. No visible blood flow in the main portal vein. IVC: No abnormality visualized. Pancreas: Visualized portion unremarkable. Area of focal fluid accumulation adjacent to the pancreatic head, measuring 3.3 x 2.8 x 2.5 cm. Spleen: Size and appearance within normal limits. Right Kidney: Length: 10.5 cm. Echogenicity within normal limits. No mass or hydronephrosis visualized. Left Kidney: Length: 11.2 cm. Echogenicity within normal limits. No mass or hydronephrosis visualized. Abdominal aorta: No aneurysm visualized. Other findings: Moderate amount of ascites.  Left pleural effusion. IMPRESSION: 1. Findings compatible with cirrhosis of the liver. 2. No visible blood flow in the main portal vein. This could be due to portal vein occlusion or difficulty penetrating the liver for flow detection. 3. Moderate amount of ascites. 4. Probable focal ascites adjacent to the head of the pancreas. A pancreatic pseudocyst is also a possibility. 5. Diffuse gallbladder wall thickening with minimal pericholecystic fluid. This is most likely due to hypoproteinemia and ascites. Chronic or acute acalculous cholecystitis could also have this appearance. 6. Left pleural effusion. Electronically Signed   By: Beckie SaltsSteven  Reid M.D.   On: 08/24/2015 14:46      Assessment: Alcoholic cirrhosis of the liver with variceal bleed status post sclerotherapy  Plan:   Continue  medical management. Watch for signs of further bleeding. If he has further bleeding per prior notes we would recommend TIPS    Gwenevere AbbotSAM F GANEM 08/25/2015, 10:53 AM  Pager: [phone] If no answer or after 5 PM call [phone] Lab Results  Component Value Date   HGB 7.7* 08/25/2015   HGB 7.9* 08/24/2015   HGB 7.9* 08/23/2015   HCT 24.5* 08/25/2015   HCT 25.2* 08/24/2015   HCT 24.2* 08/23/2015   ALKPHOS 64 08/25/2015   ALKPHOS 66 08/24/2015   ALKPHOS 64 08/21/2015   AST 825* 08/25/2015   AST 1406* 08/24/2015   AST 49* 08/21/2015   ALT 356* 08/25/2015   ALT 504* 08/24/2015   ALT 18 08/21/2015

## 2015-08-25 NOTE — Progress Notes (Signed)
CRITICAL VALUE ALERT  Critical value received:  Calcium 6.4  Date of notification:  08/25/15  Time of notification:  0705  Critical value read back: Yes  Nurse who received alert:  Adline PealsMelinda Goin  MD notified (1st page):  Belva CromeP. Mannam  Time of first page:  0709  Responding MD:  Belva CromeP. Mannam  Time MD responded:  [phone]

## 2015-08-25 NOTE — Progress Notes (Signed)
PULMONARY / CRITICAL CARE MEDICINE   Name: [name]: [id number] DOB: [birth year]    ADMISSION DATE:  08/20/2015 CONSULTATION DATE: 4/25  REFERRING MD:  [facility]  CHIEF COMPLAINT:  GIB  HISTORY OF PRESENT ILLNESS:   35 yo admitted after episode of hematemesis and hematochezia on 4/24 with continued blood loss till anemia was down to 6.8. He required electrolyte replacement for tetany with mg++, Ca++ and K+. PCCM called to place CVL but due to HD instability. On 4/25  he was intubated at GI request to facilitate EGD. Therefore PCCM will assume his care till stable. He drinks up to a fifth of vodka daily. Reports prior episode of bloody stools but did not seek medical attention. PCCM will assume his care for now.  SUBJECTIVE:  Oliguric Off precedex Got ativan this morning Groggy this morning On neo  VITAL SIGNS: BP 99/65 mmHg  Pulse 71  Temp(Src) 97.6 F (36.4 C) (Oral)  Resp 17  Ht  (1.702 m)  Wt 75.9 kg (167 lb 5.3 oz)  BMI 26.20 kg/m2  SpO2 100%  HEMODYNAMICS: CVP:  [10 mmHg-18 mmHg] 18 mmHg  VENTILATOR SETTINGS:    INTAKE / OUTPUT: I/O last 3 completed shifts: In: 4275 [I.V.:3755; IV Piggyback:520] Out: 535 [Urine:535]  PHYSICAL EXAMINATION: General: sleepy, arouses to touch, doesn't follow commands HENT: NCAT, OP clear PULM : CTA B, normal effort CV: RRR, no mgr GI: distended, BS+ Derm: multiple tattoos, no rash Neuro: arouses to voice and touch, makes purposefull movements but not talking to me or following commands  LABS:  BMET  Recent Labs Lab 08/23/15 0325 08/24/15 0500 08/25/15 0620  NA 141 139 140  K 3.4* 4.2 4.6  CL 111 109 111  CO2 BUN CREATININE 1.16 0.92 0.83  GLUCOSE 158* 115* 109*    Electrolytes  Recent Labs Lab 08/22/15 0450  08/23/15 0325 08/23/15 1110 08/24/15 0500 08/25/15 0620  CALCIUM 5.3*  < > 5.7*  --  6.1* 6.4*  MG 1.0*  --  0.8* 2.0 1.7  --   PHOS 1.9*  --   --   --  1.6* 2.1*  <  > = values in this interval not displayed.  CBC  Recent Labs Lab 08/23/15 0325 08/24/15 0500 08/25/15 0620  WBC 30.1* 19.9* 13.7*  HGB 7.9* 7.9* 7.7*  HCT 24.2* 25.2* 24.5*  PLT 310 273 227    Coag's  Recent Labs Lab 08/20/15 2144 08/22/15 0450 08/23/15 1110 08/24/15 0500  APTT 29  --   --   --   INR 1.56* 2.19* 2.32* 1.94*    Sepsis Markers  Recent Labs Lab 08/21/15 0055 08/21/15 0512 08/22/15 0450  LATICACIDVEN 2.3* 2.2* 4.1*    ABG  Recent Labs Lab 08/21/15 0830 08/22/15 0734  PHART 7.331* 7.235*  PCO2ART 35.8 32.6*  PO2ART 198* 133*    Liver Enzymes  Recent Labs Lab 08/21/15 0505 08/24/15 0500 08/25/15 0620  AST 49* 1406* 825*  ALT 18 504* 356*  ALKPHOS 64 66 64  BILITOT 2.0* 5.6* 6.6*  ALBUMIN 1.8* 1.5* 1.5*    Cardiac Enzymes  Recent Labs Lab 08/22/15 1030 08/22/15 1600 08/22/15 2334  TROPONINI <0.03 <0.03 <0.03    Glucose No results for input(s): GLUCAP in the last 168 hours.  Imaging US Abdomen Port  08/24/2015  CLINICAL DATA:  Abdominal distention. Elevated liver function tests. Alcohol abuse. EXAM: ABDOMEN ULTRASOUND COMPLETE COMPARISON:  None. FINDINGS: Gallbladder: Diffuse wall thickening  measuring up to 5.7 mm. Minimal pericholecystic fluid. No visible gallstones. The patient was not focally tender over the gallbladder. Common bile duct: Diameter: 4.1 mm Liver: Diffusely echogenic with lobular contours. No visible blood flow in the main portal vein. IVC: No abnormality visualized. Pancreas: Visualized portion unremarkable. Area of focal fluid accumulation adjacent to the pancreatic head, measuring 3.3 x 2.8 x 2.5 cm. Spleen: Size and appearance within normal limits. Right Kidney: Length: 10.5 cm. Echogenicity within normal limits. No mass or hydronephrosis visualized. Left Kidney: Length: 11.2 cm. Echogenicity within normal limits. No mass or hydronephrosis visualized. Abdominal aorta: No aneurysm visualized. Other findings:  Moderate amount of ascites.  Left pleural effusion. IMPRESSION: 1. Findings compatible with cirrhosis of the liver. 2. No visible blood flow in the main portal vein. This could be due to portal vein occlusion or difficulty penetrating the liver for flow detection. 3. Moderate amount of ascites. 4. Probable focal ascites adjacent to the head of the pancreas. A pancreatic pseudocyst is also a possibility. 5. Diffuse gallbladder wall thickening with minimal pericholecystic fluid. This is most likely due to hypoproteinemia and ascites. Chronic or acute acalculous cholecystitis could also have this appearance. 6. Left pleural effusion. Electronically Signed   By: Beckie SaltsSteven  Reid M.D.   On: 08/24/2015 14:46     STUDIES:  4/25 EGD (Dr Bosie ClosSchooler): Non-bleeding grade II varices were found in the middle third of the esophagus and in the lower third of the esophagus,. Stigmata of recent bleeding were evident and red wale signs (nipple sign) were present. Active bleeding began during attempted banding but wall appeared to be scarred and could not successfully place a band on this area. Banding was unsuccessful despite six attempts with incomplete eradication of varices. Steady bleeding continued at the end of the procedure. These were successfully injected with 4 mL ethanolamine for hemostasis. 4/27 abd US>>> 4/27 ECHO:Left ventricle: The cavity size was normal. Systolic function was normal. The estimated ejection fraction was in the range of 55% to 60%. Wall motion was normal; there were no regional wall motion abnormalities. The transmitral flow pattern was normal. The deceleration time of the early transmitral flow velocity was normal. The pulmonary vein flow pattern was normal. The tissue Doppler parameters were normal. Left ventricular diastolic function parameters were normal.  CULTURES: 4/24 BCx2>> (-)  ANTIBIOTICS: 4/24 ceftriaxone>>  SIGNIFICANT EVENTS: 4/25 intubated for EGD: see above findings 4/26:  requiring neo gtt,  Am labs showing AKI, NAG metabolic acidosis & Hyperglycemia.  4/27: AKI-->resolved; NAG metabolic acidosis-->d/t hyperchloremia and volume resuscitation efforts-->resolved; Residual Lactic acidosis-->resolved; Hyperchloremia --->resolved; HyperKalemia --->resolved -->bicarb gtt stopped. Hgb stable. Extubated 4/28 still w/ sig delirium. Still pressor dependent. LFTs increased. US pending. Changed PPI to BID and octreotide stopped.   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  Bleeding has stopped, now with slow to resolve encephalopathy likely due to alcohol withdrawal vs hepatic encephalopathy.  ASSESSMENT / PLAN:  PULMONARY A: Respiratory failure resolved P:   Monitor O2 saturation Mobilize  Asp precautions     CARDIOVASCULAR A:  Sinus tachycardia--> resolved Hemorrhagic  Shock-->resolved residual hypotension> no clear cause, sedation vs cirrhosis related? P:  Cont tele KVO IVFs Wean Neo for MAP >65 Check lactic acid Give albumin today  RENAL A:   Hypokalemia and Hypomagnesemia> resolved Hypophophatemia Oliguria  P:   Change LR to D5 1/2 NS with KCL Replace PO4 with sodium phos Repeat am chemistry  GASTROINTESTINAL A:   Variceal bleed s/p EGD 4/25, injected but not able to band Alcoholic Cirrhosis has mild ascites by bedside US on 4/27 Transaminitis, uncertain etiology but improving Likely hepatic encephalopathy  P:   PPI BID  IF re-bleeds will need TIPS Lactulose today GI following  Clear liq diet to start 4/28 Observe LFTs  HEMATOLOGIC A:   Acute Blood loss anemia from GIB Coagulopathy in setting of cirrhosis (likely) ->s/p one more unit PRBC on 4/26; also vit K P:  Follow coags and CBC in am 5/29 Avoid anticoagulants Transfuse to keep Hgb > 7 now that stable   INFECTIOUS A:   No overt infection, on IV abx empirically to  cover presumed varices  P:   Ceftriaxone day 5 on 4/28 Follow cultures  ENDOCRINE A:   No acute issue  P:   ssi   NEUROLOGIC A:   ETOH withdrawal , no hx of DT's but + sz x 1; improved  Encephalopahty prob 2/2 hepatic enceph P:   RASS goal: 0 Monitor in ICU setting Stop ativan Precedex gtt --> aim to wean off if able Lactulose  FAMILY  - Updates: Father updated at bedside by team during week, no one bedside 4/29.  - Inter-disciplinary family meet or Palliative Care meeting due by: May 1  My cc time 36 minutes  Heber Fowlerton, MD [place] [facility] Pager: [phone] Cell: [phone] After 3pm or if no response, call [phone]   08/25/2015, 8:20 AM

## 2015-08-25 NOTE — Progress Notes (Signed)
eLink Physician-Brief Progress Note Patient Name: Marcie BalDaniel W Punches DOB: [birth year] MRN: [id number]   Date of Service  08/25/2015  HPI/Events of Note  RN notified of no significant urine output with straight catheter. Suspect ascites is what is being seen on bladder scan.  eICU Interventions  Leave foley out for patient comfort.     Intervention Category Intermediate Interventions: Other:  Lawanda CousinsJennings Nanie Dunkleberger 08/25/2015, 9:52 PM

## 2015-08-26 DIAGNOSIS — G9340 Encephalopathy, unspecified: Secondary | ICD-10-CM

## 2015-08-26 LAB — CBC WITH DIFFERENTIAL/PLATELET
BASOS ABS: 0.3 10*3/uL — AB (ref 0.0–0.1)
BASOS PCT: 2 %
EOS ABS: 0.5 10*3/uL (ref 0.0–0.7)
Eosinophils Relative: 5 %
HEMATOCRIT: 23.1 % — AB (ref 39.0–52.0)
HEMOGLOBIN: 7.2 g/dL — AB (ref 13.0–17.0)
Lymphocytes Relative: 16 %
Lymphs Abs: 2 10*3/uL (ref 0.7–4.0)
MCH: 25.8 pg — ABNORMAL LOW (ref 26.0–34.0)
MCHC: 31.2 g/dL (ref 30.0–36.0)
MCV: 82.8 fL (ref 78.0–100.0)
Monocytes Absolute: 2.2 10*3/uL — ABNORMAL HIGH (ref 0.1–1.0)
Monocytes Relative: 18 %
NEUTROS ABS: 7.1 10*3/uL (ref 1.7–7.7)
NEUTROS PCT: 59 %
Platelets: 247 10*3/uL (ref 150–400)
RBC: 2.79 MIL/uL — AB (ref 4.22–5.81)
RDW: 23.1 % — ABNORMAL HIGH (ref 11.5–15.5)
WBC: 12 10*3/uL — AB (ref 4.0–10.5)

## 2015-08-26 LAB — BASIC METABOLIC PANEL
Anion gap: 8 (ref 5–15)
BUN: 8 mg/dL (ref 6–20)
CALCIUM: 7 mg/dL — AB (ref 8.9–10.3)
CO2: 23 mmol/L (ref 22–32)
CREATININE: 0.75 mg/dL (ref 0.61–1.24)
Chloride: 113 mmol/L — ABNORMAL HIGH (ref 101–111)
GFR calc Af Amer: >60 mL/min (ref >60)
Glucose, Bld: 96 mg/dL (ref 65–99)
POTASSIUM: 3.9 mmol/L (ref 3.5–5.1)
Sodium: 144 mmol/L (ref 135–145)

## 2015-08-26 LAB — HEPATIC FUNCTION PANEL
ALBUMIN: 2.2 g/dL — AB (ref 3.5–5.0)
ALK PHOS: 58 U/L (ref 38–126)
ALT: 249 U/L — ABNORMAL HIGH (ref 17–63)
AST: 512 U/L — AB (ref 15–41)
BILIRUBIN TOTAL: 7.7 mg/dL — AB (ref 0.3–1.2)
Bilirubin, Direct: 4.8 mg/dL — ABNORMAL HIGH (ref 0.1–0.5)
Indirect Bilirubin: 2.9 mg/dL — ABNORMAL HIGH (ref 0.3–0.9)
TOTAL PROTEIN: 5.5 g/dL — AB (ref 6.5–8.1)

## 2015-08-26 LAB — PHOSPHORUS: PHOSPHORUS: 2 mg/dL — AB (ref 2.5–4.6)

## 2015-08-26 LAB — MAGNESIUM: MAGNESIUM: 1.6 mg/dL — AB (ref 1.7–2.4)

## 2015-08-26 MED ORDER — LORAZEPAM 2 MG/ML IJ SOLN
INTRAMUSCULAR | Status: AC
  Filled 2015-08-26: qty 1

## 2015-08-26 MED ORDER — LORAZEPAM 2 MG/ML IJ SOLN
2.0000 mg | Freq: Once | INTRAMUSCULAR | Status: AC
  Administered 2015-08-26: 2 mg via INTRAVENOUS

## 2015-08-26 MED ORDER — LORAZEPAM 2 MG/ML IJ SOLN
INTRAMUSCULAR | Status: AC
  Filled 2015-08-26: qty 2

## 2015-08-26 MED ORDER — RISPERIDONE 1 MG/ML PO SOLN
1.0000 mg | Freq: Two times a day (BID) | ORAL | Status: DC
  Filled 2015-08-26: qty 1

## 2015-08-26 MED ORDER — RISPERIDONE 1 MG/ML PO SOLN
1.0000 mg | Freq: Two times a day (BID) | ORAL | Status: DC
  Administered 2015-08-26: 1 mg via ORAL
  Filled 2015-08-26 (×3): qty 1

## 2015-08-26 MED ORDER — LORAZEPAM 2 MG/ML IJ SOLN
2.0000 mg | Freq: Once | INTRAMUSCULAR | Status: AC
  Administered 2015-08-26: 2 mg via INTRAVENOUS
  Filled 2015-08-26: qty 1

## 2015-08-26 MED ORDER — ALBUMIN HUMAN 25 % IV SOLN
50.0000 g | Freq: Once | INTRAVENOUS | Status: AC
  Administered 2015-08-26: 50 g via INTRAVENOUS
  Filled 2015-08-26: qty 50

## 2015-08-26 MED ORDER — LORAZEPAM 2 MG/ML IJ SOLN
4.0000 mg | Freq: Once | INTRAMUSCULAR | Status: AC
  Administered 2015-08-26: 4 mg via INTRAVENOUS

## 2015-08-26 NOTE — Progress Notes (Signed)
eLink Physician-Brief Progress Note Patient Name: Marcie BalDaniel W Pinard DOB: January 26, 1981 MRN: [id number]   Date of Service  08/26/2015  HPI/Events of Note  Agitation persists, ativan didn't help  eICU Interventions  Add risperdol QTc monitoring     Intervention Category Intermediate Interventions: Change in mental status - evaluation and management  Max FickleDouglas Isais Klipfel 08/26/2015, 4:13 PM

## 2015-08-26 NOTE — Progress Notes (Signed)
[facility] Progress Note  Subjective: The patient is lethargic but arousable. When aroused he is confused. No report by nurses of any further GI bleeding.  Objective: Vital signs in last 24 hours: Temp:  [97 F (36.1 C)-98.8 F (37.1 C)] 98.8 F (37.1 C) (04/30 0400) Pulse Rate:  [57-136] 99 (04/30 0955) Resp:  [19-45] 33 (04/30 0955) BP: (75-134)/(32-93) 107/62 mmHg (04/30 0955) SpO2:  [56 %-100 %] 100 % (04/30 0955) Weight change:    PE:  No distress  Lethargic and confused when aroused  Heart regular rhythm  Abdomen firm but nontender  Lab Results: Results for orders placed or performed during the hospital encounter of 08/20/15 (from the past 24 hour(s))  Basic metabolic panel     Status: Abnormal   Collection Time: 08/26/15  7:45 AM  Result Value Ref Range   Sodium 144 135 - 145 mmol/L   Potassium 3.9 3.5 - 5.1 mmol/L   Chloride 113 (H) 101 - 111 mmol/L   CO2 23 22 - 32 mmol/L   Glucose, Bld 96 65 - 99 mg/dL   BUN 8 6 - 20 mg/dL   Creatinine, Ser 1.61 0.61 - 1.24 mg/dL   Calcium 7.0 (L) 8.9 - 10.3 mg/dL   GFR calc non Af Amer >60 >60 mL/min   GFR calc Af Amer >60 >60 mL/min   Anion gap 8 5 - 15  CBC with Differential/Platelet     Status: Abnormal   Collection Time: 08/26/15  7:45 AM  Result Value Ref Range   WBC 12.0 (H) 4.0 - 10.5 K/uL   RBC 2.79 (L) 4.22 - 5.81 MIL/uL   Hemoglobin 7.2 (L) 13.0 - 17.0 g/dL   HCT 09.6 (L) 04.5 - 40.9 %   MCV 82.8 78.0 - 100.0 fL   MCH 25.8 (L) 26.0 - 34.0 pg   MCHC 31.2 30.0 - 36.0 g/dL   RDW 81.1 (H) 91.4 - 78.2 %   Platelets 247 150 - 400 K/uL   Neutrophils Relative % 59 %   Neutro Abs 7.1 1.7 - 7.7 K/uL   Lymphocytes Relative 16 %   Lymphs Abs 2.0 0.7 - 4.0 K/uL   Monocytes Relative 18 %   Monocytes Absolute 2.2 (H) 0.1 - 1.0 K/uL   Eosinophils Relative 5 %   Eosinophils Absolute 0.5 0.0 - 0.7 K/uL   Basophils Relative 2 %   Basophils Absolute 0.3 (H) 0.0 - 0.1 K/uL  Hepatic function panel      Status: Abnormal   Collection Time: 08/26/15  7:45 AM  Result Value Ref Range   Total Protein 5.5 (L) 6.5 - 8.1 g/dL   Albumin 2.2 (L) 3.5 - 5.0 g/dL   AST 956 (H) 15 - 41 U/L   ALT 249 (H) 17 - 63 U/L   Alkaline Phosphatase 58 38 - 126 U/L   Total Bilirubin 7.7 (H) 0.3 - 1.2 mg/dL   Bilirubin, Direct 4.8 (H) 0.1 - 0.5 mg/dL   Indirect Bilirubin 2.9 (H) 0.3 - 0.9 mg/dL  Magnesium     Status: Abnormal   Collection Time: 08/26/15  7:45 AM  Result Value Ref Range   Magnesium 1.6 (L) 1.7 - 2.4 mg/dL  Phosphorus     Status: Abnormal   Collection Time: 08/26/15  7:45 AM  Result Value Ref Range   Phosphorus 2.0 (L) 2.5 - 4.6 mg/dL    Studies/Results: No results found.    Assessment: Alcoholic cirrhosis of the liver with variceal bleed. No further signs  of bleeding at this time.  Hepatic encephalopathy  Plan:   Continue medical management. Watch for further signs of bleeding.    SAM F Arnel Wymer 08/26/2015, 10:14 AM  Pager: [phone] If no answer or after 5 PM call [phone]

## 2015-08-26 NOTE — Progress Notes (Signed)
eLink Physician-Brief Progress Note Patient Name: James BalDaniel W Bagheri DOB: [birth year] MRN: [id number]   Date of Service  08/26/2015  HPI/Events of Note  Agitated again  eICU Interventions  Ativan again x2mg  Increase top end precedex     Intervention Category Major Interventions: Change in mental status - evaluation and management  Max FickleDouglas Keldrick Pomplun 08/26/2015, 8:54 PM

## 2015-08-26 NOTE — Progress Notes (Signed)
PULMONARY / CRITICAL CARE MEDICINE   Name: [name]: [id number] DOB: 1980/09/01    ADMISSION DATE:  08/20/2015 CONSULTATION DATE: 4/25  REFERRING MD:  [facility]  CHIEF COMPLAINT:  GIB  HISTORY OF PRESENT ILLNESS:   35 yo admitted after episode of hematemesis and hematochezia on 4/24 with continued blood loss till anemia was down to 6.8. He required electrolyte replacement for tetany with mg++, Ca++ and K+. PCCM called to place CVL but due to HD instability. On 4/25  he was intubated at GI request to facilitate EGD. Therefore PCCM will assume his care till stable. He drinks up to a fifth of vodka daily. Reports prior episode of bloody stools but did not seek medical attention. PCCM will assume his care for now.  SUBJECTIVE:  Still on precedex for intermittent combativeness Foley out Still on neo  VITAL SIGNS: BP 120/85 mmHg  Pulse 79  Temp(Src) 98.8 F (37.1 C) (Oral)  Resp 23  Ht  (1.702 m)  Wt 75.9 kg (167 lb 5.3 oz)  BMI 26.20 kg/m2  SpO2 100%  HEMODYNAMICS: CVP:  [16 mmHg-19 mmHg] 16 mmHg  VENTILATOR SETTINGS:    INTAKE / OUTPUT: I/O last 3 completed shifts: In: 1897.3 [I.V.:1797.3; IV Piggyback:100] Out: 290 [Urine:290]  PHYSICAL EXAMINATION: General: sleepy, arouses to touch, doesn't follow commands HENT: NCAT, OP clear PULM : CTA B, normal effort CV: RRR, no mgr GI: distended, BS+ Derm: multiple tattoos, no rash Neuro: arouses to voice and touch, clear speech but not oriented  LABS:  BMET  Recent Labs Lab 08/23/15 0325 08/24/15 0500 08/25/15 0620  NA 141 139 140  K 3.4* 4.2 4.6  CL 111 109 111  CO2 BUN CREATININE 1.16 0.92 0.83  GLUCOSE 158* 115* 109*    Electrolytes  Recent Labs Lab 08/22/15 0450  08/23/15 0325 08/23/15 1110 08/24/15 0500 08/25/15 0620  CALCIUM 5.3*  < > 5.7*  --  6.1* 6.4*  MG 1.0*  --  0.8* 2.0 1.7  --   PHOS 1.9*  --   --   --  1.6* 2.1*  < > = values in this interval not  displayed.  CBC  Recent Labs Lab 08/23/15 0325 08/24/15 0500 08/25/15 0620  WBC 30.1* 19.9* 13.7*  HGB 7.9* 7.9* 7.7*  HCT 24.2* 25.2* 24.5*  PLT 310 273 227    Coag's  Recent Labs Lab 08/20/15 2144 08/22/15 0450 08/23/15 1110 08/24/15 0500  APTT 29  --   --   --   INR 1.56* 2.19* 2.32* 1.94*    Sepsis Markers  Recent Labs Lab 08/21/15 0512 08/22/15 0450 08/25/15 1000  LATICACIDVEN 2.2* 4.1* 1.8    ABG  Recent Labs Lab 08/21/15 0830 08/22/15 0734  PHART 7.331* 7.235*  PCO2ART 35.8 32.6*  PO2ART 198* 133*    Liver Enzymes  Recent Labs Lab 08/21/15 0505 08/24/15 0500 08/25/15 0620  AST 49* 1406* 825*  ALT 18 504* 356*  ALKPHOS 64 66 64  BILITOT 2.0* 5.6* 6.6*  ALBUMIN 1.8* 1.5* 1.5*    Cardiac Enzymes  Recent Labs Lab 08/22/15 1030 08/22/15 1600 08/22/15 2334  TROPONINI <0.03 <0.03 <0.03    Glucose No results for input(s): GLUCAP in the last 168 hours.  Imaging No results found.   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 Stigmata of recent bleeding  were evident and red wale signs (nipple sign) were present. Active bleeding began during attempted banding but wall appeared to be scarred and could not successfully place a band on this area. Banding was unsuccessful despite six attempts with incomplete eradication of varices. Steady bleeding continued at the end of the procedure. These were successfully injected with 4 mL ethanolamine for hemostasis. 4/27 abd US>>> 4/27 ECHO:Left ventricle: The cavity size was normal. Systolic function was normal. The estimated ejection fraction was in the range of 55% to 60%. Wall motion was normal; there were no regional wall motion abnormalities. The transmitral flow pattern was normal. The deceleration time of the early transmitral flow velocity was normal. The pulmonary vein flow pattern was  normal. The tissue Doppler parameters were normal. Left ventricular diastolic function parameters were normal.  CULTURES: 4/24 BCx2>> (-)  ANTIBIOTICS: 4/24 ceftriaxone>>  SIGNIFICANT EVENTS: 4/25 intubated for EGD: see above findings 4/26: requiring neo gtt,  Am labs showing AKI, NAG metabolic acidosis & Hyperglycemia.  4/27: AKI-->resolved; NAG metabolic acidosis-->d/t hyperchloremia and volume resuscitation efforts-->resolved; Residual Lactic acidosis-->resolved; Hyperchloremia --->resolved; HyperKalemia --->resolved -->bicarb gtt stopped. Hgb stable. Extubated 4/28 still w/ sig delirium. Still pressor dependent. LFTs increased. US pending. Changed PPI to BID and octreotide stopped.   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  Bleeding has stopped, now with slow to resolve encephalopathy likely due to alcohol withdrawal vs hepatic encephalopathy.  ASSESSMENT / PLAN:  PULMONARY A: Respiratory failure resolved P:   Monitor O2 saturation Mobilize  Asp precautions     CARDIOVASCULAR A:  Sinus tachycardia--> resolved Hemorrhagic  Shock-->resolved residual hypotension> no clear cause, sedation vs cirrhosis related? P:  Cont tele KVO IVFs Wean Neo for MAP >55 Check lactic acid Give albumin today again  RENAL A:   Hypokalemia and Hypomagnesemia> resolved Hypophophatemia Oliguria  P:   D5 1/2 NS with KCL Replace PO4 with sodium phos Repeat am chemistry     GASTROINTESTINAL A:   Variceal bleed s/p EGD 4/25, injected but not able to band Alcoholic Cirrhosis has mild ascites by bedside US on 4/27 Transaminitis, uncertain etiology but improving Likely hepatic encephalopathy  P:   PPI BID  IF re-bleeds will need TIPS Lactulose today again  GI following  Clears to start if mental status allows Observe LFTs  HEMATOLOGIC A:   Acute Blood loss anemia from  GIB Coagulopathy in setting of cirrhosis (likely) ->s/p one more unit PRBC on 4/26; also vit K P:  Avoid anticoagulants Transfuse to keep Hgb > 7 now that stable   INFECTIOUS A:   No overt infection, on IV abx empirically to cover presumed varices  P:   Ceftriaxone day 5 on 4/28 Follow cultures  ENDOCRINE A:   No acute issue  P:   ssi   NEUROLOGIC A:   ETOH withdrawal , no hx of DT's but + sz x 1; improved  Encephalopahty prob 2/2 hepatic enceph P:   RASS goal: 0 Monitor in ICU setting Precedex gtt --> aim to wean off if able  Lactulose  FAMILY  - Updates: Father updated at bedside by team during week, no one bedside 4/30.  - Inter-disciplinary family meet or Palliative Care meeting due by: May 1  My cc time 31 minutes  Heber CarolinaBrent McQuaid, MD [place] [facility] Pager: [phone] Cell: [phone] After 3pm or if no response, call [phone]   08/26/2015, 7:45 AM

## 2015-08-26 NOTE — Progress Notes (Signed)
eLink Physician-Brief Progress Note Patient Name: James BalDaniel W Bonenfant DOB: Feb 29, 1981 MRN: [id number]   Date of Service  08/26/2015  HPI/Events of Note  Severe agitation  eICU Interventions  Ativan 4mg  IV now     Intervention Category Major Interventions: Delirium, psychosis, severe agitation - evaluation and management  [name] 08/26/2015, 5:47 PM

## 2015-08-27 DIAGNOSIS — G9340 Encephalopathy, unspecified: Secondary | ICD-10-CM

## 2015-08-27 LAB — BASIC METABOLIC PANEL
ANION GAP: 7 (ref 5–15)
BUN: 7 mg/dL (ref 6–20)
CHLORIDE: 114 mmol/L — AB (ref 101–111)
CO2: 25 mmol/L (ref 22–32)
Calcium: 7.2 mg/dL — ABNORMAL LOW (ref 8.9–10.3)
Creatinine, Ser: 0.81 mg/dL (ref 0.61–1.24)
GFR calc Af Amer: >60 mL/min (ref >60)
GFR calc non Af Amer: >60 mL/min (ref >60)
GLUCOSE: 127 mg/dL — AB (ref 65–99)
POTASSIUM: 4.3 mmol/L (ref 3.5–5.1)
Sodium: 146 mmol/L — ABNORMAL HIGH (ref 135–145)

## 2015-08-27 LAB — CBC WITH DIFFERENTIAL/PLATELET
BASOS PCT: 2 %
Basophils Absolute: 0.1 10*3/uL (ref 0.0–0.1)
Eosinophils Absolute: 0.3 10*3/uL (ref 0.0–0.7)
Eosinophils Relative: 4 %
HEMATOCRIT: 19.7 % — AB (ref 39.0–52.0)
HEMOGLOBIN: 6.1 g/dL — AB (ref 13.0–17.0)
LYMPHS PCT: 19 %
Lymphs Abs: 1.2 10*3/uL (ref 0.7–4.0)
MCH: 26.3 pg (ref 26.0–34.0)
MCHC: 31 g/dL (ref 30.0–36.0)
MCV: 84.9 fL (ref 78.0–100.0)
MONOS PCT: 15 %
Monocytes Absolute: 0.9 10*3/uL (ref 0.1–1.0)
NEUTROS ABS: 3.8 10*3/uL (ref 1.7–7.7)
Neutrophils Relative %: 60 %
Platelets: 146 10*3/uL — ABNORMAL LOW (ref 150–400)
RBC: 2.32 MIL/uL — ABNORMAL LOW (ref 4.22–5.81)
RDW: 23.5 % — ABNORMAL HIGH (ref 11.5–15.5)
WBC: 6.3 10*3/uL (ref 4.0–10.5)

## 2015-08-27 LAB — PROCALCITONIN: PROCALCITONIN: 0.74 ng/mL

## 2015-08-27 LAB — HEMOGLOBIN AND HEMATOCRIT, BLOOD
HEMATOCRIT: 20.3 % — AB (ref 39.0–52.0)
Hemoglobin: 6.2 g/dL — CL (ref 13.0–17.0)

## 2015-08-27 LAB — PREPARE RBC (CROSSMATCH)

## 2015-08-27 LAB — PHOSPHORUS: Phosphorus: 2.6 mg/dL (ref 2.5–4.6)

## 2015-08-27 MED ORDER — CIPROFLOXACIN IN D5W 400 MG/200ML IV SOLN
400.0000 mg | Freq: Two times a day (BID) | INTRAVENOUS | Status: AC
  Administered 2015-08-27 – 2015-08-31 (×9): 400 mg via INTRAVENOUS
  Filled 2015-08-27 (×9): qty 200

## 2015-08-27 MED ORDER — HALOPERIDOL LACTATE 5 MG/ML IJ SOLN
5.0000 mg | Freq: Four times a day (QID) | INTRAMUSCULAR | Status: DC
  Administered 2015-08-27 – 2015-08-28 (×5): 5 mg via INTRAVENOUS
  Filled 2015-08-27 (×4): qty 1

## 2015-08-27 MED ORDER — HYDROCORTISONE NA SUCCINATE PF 100 MG IJ SOLR
50.0000 mg | Freq: Four times a day (QID) | INTRAMUSCULAR | Status: DC
  Administered 2015-08-27 – 2015-08-29 (×9): 50 mg via INTRAVENOUS
  Filled 2015-08-27 (×9): qty 2

## 2015-08-27 MED ORDER — RIFAXIMIN 200 MG PO TABS
400.0000 mg | ORAL_TABLET | Freq: Three times a day (TID) | ORAL | Status: DC
  Filled 2015-08-27 (×6): qty 2

## 2015-08-27 MED ORDER — DEXMEDETOMIDINE HCL IN NACL 400 MCG/100ML IV SOLN
0.2000 ug/kg/h | INTRAVENOUS | Status: AC
  Administered 2015-08-27 (×2): 1.2 ug/kg/h via INTRAVENOUS
  Administered 2015-08-27: 1.4 ug/kg/h via INTRAVENOUS
  Administered 2015-08-28: 1.6 ug/kg/h via INTRAVENOUS
  Administered 2015-08-28: 1.7 ug/kg/h via INTRAVENOUS
  Administered 2015-08-28: 1.6 ug/kg/h via INTRAVENOUS
  Filled 2015-08-27 (×7): qty 100

## 2015-08-27 MED ORDER — DEXTROSE 5 % IV SOLN
INTRAVENOUS | Status: DC
  Administered 2015-08-27 – 2015-09-08 (×9): via INTRAVENOUS

## 2015-08-27 MED ORDER — HALOPERIDOL LACTATE 5 MG/ML IJ SOLN
5.0000 mg | INTRAMUSCULAR | Status: AC
  Filled 2015-08-27: qty 1

## 2015-08-27 MED ORDER — HALOPERIDOL LACTATE 5 MG/ML IJ SOLN
INTRAMUSCULAR | Status: AC
  Filled 2015-08-27: qty 1

## 2015-08-27 MED ORDER — SODIUM CHLORIDE 0.9 % IV SOLN: Freq: Once | INTRAVENOUS | Status: DC

## 2015-08-27 NOTE — Progress Notes (Signed)
CSW following to assist with SA resources. PN reviewed. MD documents pt is confused and is being treated for agitation. CSW will continue to review PN and will see pt once mental status improves.  Cori RazorJamie Kruze Atchley LCSW [phone]

## 2015-08-27 NOTE — Progress Notes (Signed)
CRITICAL VALUE ALERT  Critical value received:  Hemo: 6.1  Date of notification:  08/27/15  Time of notification:  0455  Critical value read back:Yes.    Nurse who received alert:  Rutha BouchardShelby Kleo Dungee, RN  MD notified (1st page):  Dr. Jamison NeighborNestor, E-Link  Time of first page:  0500  MD notified (2nd page):  Time of second page:  Responding MD:  Dr. Jamison NeighborNestor  Time MD responded:  [id number]

## 2015-08-27 NOTE — Progress Notes (Signed)
PULMONARY / CRITICAL CARE MEDICINE   Name: [name]: [id number] DOB: 10-17-1980    ADMISSION DATE:  08/20/2015 CONSULTATION DATE: 4/25  REFERRING MD:  [facility]  CHIEF COMPLAINT:  GIB  HISTORY OF PRESENT ILLNESS:   35 yo admitted after episode of hematemesis and hematochezia on 4/24 with continued blood loss till anemia was down to 6.8. He required electrolyte replacement for tetany with mg++, Ca++ and K+. PCCM called to place CVL but due to HD instability. On 4/25  he was intubated at GI request to facilitate EGD. Therefore PCCM will assume his care till stable. He drinks up to a fifth of vodka daily. Reports prior episode of bloody stools but did not seek medical attention. PCCM will assume his care for now.  SUBJECTIVE:  Still on precedex for intermittent combativeness Off neo  VITAL SIGNS: BP 94/66 mmHg  Pulse 65  Temp(Src) 93.6 F (34.2 C) (Rectal)  Resp 18  Ht 5\' 7"  (1.702 m)  Wt 167 lb 5.3 oz (75.9 kg)  BMI 26.20 kg/m2  SpO2 98%  HEMODYNAMICS: CVP:  [18 mmHg-20 mmHg] 18 mmHg  VENTILATOR SETTINGS:    INTAKE / OUTPUT: I/O last 3 completed shifts: In: 2267.1 [I.V.:2167.1; IV Piggyback:100] Out: 50 [Urine:50]  PHYSICAL EXAMINATION: General: sleepy, arouses to touch, doesn't follow commands; moans HENT: NCAT, OP clear PULM : occ rhonchi that improve w. Cough. normal effort CV: RRR, no mgr GI: distended, BS+ Derm: multiple tattoos, no rash Neuro: arouses to voice and touch, slurred speech but not oriented  LABS:  BMET  Recent Labs Lab 08/25/15 0620 08/26/15 0745 08/27/15 0350  NA 140 144 146*  K 4.6 3.9 4.3  CL 111 113* 114*  CO2 24 23 25   BUN 8 8 7   CREATININE 0.83 0.75 0.81  GLUCOSE 109* 96 127*    Electrolytes  Recent Labs Lab 08/23/15 1110 08/24/15 0500 08/25/15 0620 08/26/15 0745 08/27/15 0350  CALCIUM  --  6.1* 6.4* 7.0* 7.2*  MG 2.0 1.7  --  1.6*  --   PHOS  --  1.6* 2.1* 2.0* 2.6    CBC  Recent Labs Lab  08/25/15 0620 08/26/15 0745 08/27/15 0350 08/27/15 0548  WBC 13.7* 12.0* 6.3  --   HGB 7.7* 7.2* 6.1* 6.2*  HCT 24.5* 23.1* 19.7* 20.3*  PLT 227 247 146*  --     Coag's  Recent Labs Lab 08/20/15 2144 08/22/15 0450 08/23/15 1110 08/24/15 0500  APTT 29  --   --   --   INR 1.56* 2.19* 2.32* 1.94*    Sepsis Markers  Recent Labs Lab 08/21/15 0512 08/22/15 0450 08/25/15 1000  LATICACIDVEN 2.2* 4.1* 1.8    ABG  Recent Labs Lab 08/21/15 0830 08/22/15 0734  PHART 7.331* 7.235*  PCO2ART 35.8 32.6*  PO2ART 198* 133*    Liver Enzymes  Recent Labs Lab 08/24/15 0500 08/25/15 0620 08/26/15 0745  AST 1406* 825* 512*  ALT 504* 356* 249*  ALKPHOS 66 64 58  BILITOT 5.6* 6.6* 7.7*  ALBUMIN 1.5* 1.5* 2.2*    Cardiac Enzymes  Recent Labs Lab 08/22/15 1030 08/22/15 1600 08/22/15 2334  TROPONINI <0.03 <0.03 <0.03    Glucose No results for input(s): GLUCAP in the last 168 hours.  Imaging No results found.   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 Stigmata of recent bleeding were evident and red wale signs (  nipple sign) were present. Active bleeding began during attempted banding but wall appeared to be scarred and could not successfully place a band on this area. Banding was unsuccessful despite six attempts with incomplete eradication of varices. Steady bleeding continued at the end of the procedure. These were successfully injected with 4 mL ethanolamine for hemostasis. 4/27 abd US>>> 4/27 ECHO:Left ventricle: The cavity size was normal. Systolic function was normal. The estimated ejection fraction was in the range of 55% to 60%. Wall motion was normal; there were no regional wall motion abnormalities. The transmitral flow pattern was normal. The deceleration time of the early transmitral flow velocity was normal. The pulmonary vein flow pattern was  normal. The tissue Doppler parameters were normal. Left ventricular diastolic function parameters were normal. 5/1: abd [place] + cirrhosis  CULTURES: 4/24 BCx2>> (-)  ANTIBIOTICS: 4/24 ceftriaxone>>5/1  SIGNIFICANT EVENTS: 4/25 intubated for EGD: see above findings 4/26: requiring neo gtt,  Am labs showing AKI, NAG metabolic acidosis & Hyperglycemia.  4/27: AKI-->resolved; NAG metabolic acidosis-->d/t hyperchloremia and volume resuscitation efforts-->resolved; Residual Lactic acidosis-->resolved; Hyperchloremia --->resolved; HyperKalemia --->resolved -->bicarb gtt stopped. Hgb stable. Extubated 4/28 still w/ sig delirium. Still pressor dependent. LFTs increased. [place] pending. Changed PPI to BID and octreotide stopped.  5/1 [place] w/ evidence of cirrhosis. Still on precedex. Off pressors; but boarderline BP.   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  Bleeding has stopped, now with slow to resolve encephalopathy. Favor ICU related delirium and hepatic encephalopathy at this point. Getting blood today (5/1)-->I believe more from ICU related anemia and hemodilution. We need to have goals of care discussion w/ family. Will arranged this w/ his father today.  ASSESSMENT / PLAN:  PULMONARY A: Respiratory failure resolved P:   Monitor O2 saturation Mobilize  Asp precautions     CARDIOVASCULAR A:  Sinus tachycardia--> resolved Hemorrhagic  Shock-->resolved residual hypotension> no clear cause, sedation vs cirrhosis related? P:  Cont tele KVO IVFs Wean Neo for MAP >55 Give albumin today again  RENAL A:   Hypokalemia and Hypomagnesemia; intermittent  Hypophophatemia Hypernatremia  Oliguria  P:   Free water replacement w/ D5w @ 45cc/hr  Repeat am chemistry     GASTROINTESTINAL A:   Variceal bleed s/p EGD 4/25, injected but not able to band Alcoholic Cirrhosis has mild ascites  by bedside US on 4/27 Transaminitis, uncertain etiology but improving Likely hepatic encephalopathy  P:   PPI BID  IF re-bleeds will need TIPS Lactulose scheduled GI following  Clears to start if mental status allows Observe LFTs  HEMATOLOGIC A:   Acute Blood loss anemia from GIB (resolved); Now hgb reflecting anemia of critical illness. Coagulopathy in setting of cirrhosis (likely) ->s/p one more unit PRBC on 4/26; also vit K; repeat x fusion 5/1 P:  Avoid anticoagulants Transfuse to keep Hgb > 7 now that stable   INFECTIOUS A:   No overt infection, on IV abx empirically to cover presumed varices  P:   Ceftriaxone day 7, will stop as abd is non-tender and no evidence of new infection  Follow cultures  ENDOCRINE A:   Adrenal insuff P:   ssi  Add stress dose steroids  NEUROLOGIC A:   ETOH withdrawal , no hx of DT's but + sz x 1; improved  Encephalopahty prob 2/2 hepatic enceph ->he appears over sedated this am (5/1) P:   RASS goal: 0 Monitor in ICU setting Precedex gtt --> aim  to wean off if able  Lactulose started  FAMILY  - Updates: Father updated at bedside by team during week, no one bedside 4/30.  - Inter-disciplinary family meet or Palliative Care meeting due by: May 1-->will schedule w/ his family     08/27/2015, 8:47 AM            Shelby MattocksPete E Babcock 08/27/2015, 8:46 AM

## 2015-08-27 NOTE — Progress Notes (Signed)
Subjective: Predominantly confused. Remains intermittently on Precedex, due to agitation. No further hematemesis.  Objective: Vital signs in last 24 hours: Temp:  [93.6 F (34.2 C)-98.8 F (37.1 C)] 93.6 F (34.2 C) (05/01 0800) Pulse Rate:  [65-144] 109 (05/01 1000) Resp:  [17-40] 24 (05/01 1000) BP: (83-136)/(50-101) 134/58 mmHg (05/01 1000) SpO2:  [90 %-100 %] 90 % (05/01 1000) Weight change:  Last BM Date: 08/26/15  PE: GEN:  Jaundiced, older-appearing than stated age, somnolent and not arousable to voice SKIN:  Scattered ecchymoses; diffuse edema ABD:  Moderate distention consistent with tense ascites EXT:  2+ lower extremity edema.  Lab Results: CBC    Component Value Date/Time   WBC 6.3 08/27/2015 0350   RBC 2.32* 08/27/2015 0350   HGB 6.2* 08/27/2015 0548   HCT 20.3* 08/27/2015 0548   PLT 146* 08/27/2015 0350   MCV 84.9 08/27/2015 0350   MCH 26.3 08/27/2015 0350   MCHC 31.0 08/27/2015 0350   RDW 23.5* 08/27/2015 0350   LYMPHSABS 1.2 08/27/2015 0350   MONOABS 0.9 08/27/2015 0350   EOSABS 0.3 08/27/2015 0350   BASOSABS 0.1 08/27/2015 0350   CMP     Component Value Date/Time   NA 146* 08/27/2015 0350   K 4.3 08/27/2015 0350   CL 114* 08/27/2015 0350   CO2 25 08/27/2015 0350   GLUCOSE 127* 08/27/2015 0350   BUN 7 08/27/2015 0350   CREATININE 0.81 08/27/2015 0350   CALCIUM 7.2* 08/27/2015 0350   PROT 5.5* 08/26/2015 0745   ALBUMIN 2.2* 08/26/2015 0745   AST 512* 08/26/2015 0745   ALT 249* 08/26/2015 0745   ALKPHOS 58 08/26/2015 0745   BILITOT 7.7* 08/26/2015 0745   GFRNONAA >60 08/27/2015 0350   GFRAA >60 08/27/2015 0350   Most recent INR 1.94  Portable U/S 4/28:  Moderate ascites; cirrhosis; poor left portal vein flow.  Assessment:  1. 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 No evidence of ongoing bleeding. INR currently 1.94. 2. Acute blood loss anemia. 3.  Abdominal distention. Possible intramural abdominal wall edema and ileus; bedside ultrasound done by PCCM team.  Ascites noted on recent abdominal ultrasound. 4. Cirrhosis, alcohol-mediated, with decompensation (bleeding, ascites, coagulopathy). 5. Elevated LFTs. Multifactorial (background of cirrhosis, but current degree of elevation likely predominantly component of shock liver). 8. Somnolence. Sedation versus hepatic encephalopathy versus alcohol withdrawal versus component of both.  Plan:  1.  Try to wean off sedation, so we can better gauge patient's residual degree of confusion. 2.  If patient doesn't wake up off sedation today, will need to consider lactulose enemas (otherwise, if awakens today, would ideally resume the oral rifaximin and oral lactulose). 3.  Paracentesis, diagnostic and therapeutic.  Patient's INR is 1.94; defer to IR team whether further corrective action or labs are necessary. 4.  Cipro 400 mg IV Q 12 hours (GI bleeding in patient with ascites). 5.  Continue pantoprazole 40 mg IV q 12 hours. 6.  Clear liquid diet, once patient's mental status allow. 7.  Prognosis guarded.   James JakschOUTLAW,[name] 08/27/2015, 11:02 AM   Pager [phone] If no answer or after 5 PM call [phone]

## 2015-08-27 NOTE — Progress Notes (Signed)
eLink Physician-Brief Progress Note Patient Name: James BalDaniel W [name] DOB: [birth year] MRN: [id number]   Date of Service  08/27/2015  HPI/Events of Note  Notified by bedside nurse of hemoglobin 6.1.Reviewing labs shows decline in all cell count lines including platelets and WBC. Patient has had no obvious hematochezia. Currently on lactulose with frequent stools. Normotensive.  eICU Interventions  1. Checking stat hemoglobin/hematocrit 2. Checking stat type and screen 3. Holding off on transfusion at this time     Intervention Category Major Interventions: Hemorrhage - evaluation and management  Lawanda CousinsJennings [name] 08/27/2015, 5:11 AM

## 2015-08-27 NOTE — Progress Notes (Signed)
Date:  Aug 27, 2015 Chart reviewed for concurrent status and case management needs. Will continue to follow patient for changes and needs: hgb 6.1 and receiving bld products ams and on iv precedex Marcelle Smilinghonda Likisha Alles, BSN, [place]RN3, [place]CCM   [phone]

## 2015-08-27 NOTE — Progress Notes (Signed)
eLink Physician-Brief Progress Note Patient Name: James BalDaniel W [name] DOB: 01/14/1981 MRN: [id number]   Date of Service  08/27/2015  HPI/Events of Note  Repeat hemoglobin/hematocrit 6.2.  eICU Interventions  1. Transfuse is 1 unit packed red blood cells over 4 hours 2. Hemoglobin/hematocrit posttransfusion     Intervention Category Intermediate Interventions: Bleeding - evaluation and treatment with blood products  James CousinsJennings Ranyah [name] 08/27/2015, 6:31 AM

## 2015-08-28 ENCOUNTER — Inpatient Hospital Stay (HOSPITAL_COMMUNITY): Payer: Medicaid Other

## 2015-08-28 LAB — TYPE AND SCREEN
ABO/RH(D): O NEG
ANTIBODY SCREEN: NEGATIVE
UNIT DIVISION: 0

## 2015-08-28 LAB — COMPREHENSIVE METABOLIC PANEL
ALK PHOS: 49 U/L (ref 38–126)
ALT: 154 U/L — AB (ref 17–63)
ANION GAP: 8 (ref 5–15)
AST: 253 U/L — ABNORMAL HIGH (ref 15–41)
Albumin: 2.3 g/dL — ABNORMAL LOW (ref 3.5–5.0)
BUN: <5 mg/dL — ABNORMAL LOW (ref 6–20)
CALCIUM: 7.4 mg/dL — AB (ref 8.9–10.3)
CHLORIDE: 113 mmol/L — AB (ref 101–111)
CO2: 23 mmol/L (ref 22–32)
CREATININE: 0.73 mg/dL (ref 0.61–1.24)
Glucose, Bld: 143 mg/dL — ABNORMAL HIGH (ref 65–99)
Potassium: 4.4 mmol/L (ref 3.5–5.1)
SODIUM: 144 mmol/L (ref 135–145)
Total Bilirubin: 8.1 mg/dL — ABNORMAL HIGH (ref 0.3–1.2)
Total Protein: 5.6 g/dL — ABNORMAL LOW (ref 6.5–8.1)

## 2015-08-28 LAB — CBC
HCT: 25 % — ABNORMAL LOW (ref 39.0–52.0)
Hemoglobin: 7.8 g/dL — ABNORMAL LOW (ref 13.0–17.0)
MCH: 26.4 pg (ref 26.0–34.0)
MCHC: 31.2 g/dL (ref 30.0–36.0)
MCV: 84.7 fL (ref 78.0–100.0)
PLATELETS: 150 10*3/uL (ref 150–400)
RBC: 2.95 MIL/uL — AB (ref 4.22–5.81)
RDW: 21.9 % — ABNORMAL HIGH (ref 11.5–15.5)
WBC: 8.6 10*3/uL (ref 4.0–10.5)

## 2015-08-28 LAB — PROCALCITONIN: PROCALCITONIN: 0.35 ng/mL

## 2015-08-28 LAB — PHOSPHORUS: Phosphorus: 2.3 mg/dL — ABNORMAL LOW (ref 2.5–4.6)

## 2015-08-28 LAB — MAGNESIUM: MAGNESIUM: 1.6 mg/dL — AB (ref 1.7–2.4)

## 2015-08-28 LAB — AMMONIA: AMMONIA: 50 umol/L — AB (ref 9–35)

## 2015-08-28 MED ORDER — CLONIDINE HCL 0.1 MG PO TABS
0.1000 mg | ORAL_TABLET | Freq: Two times a day (BID) | ORAL | Status: DC
  Administered 2015-08-28 – 2015-08-30 (×4): 0.1 mg
  Filled 2015-08-28 (×5): qty 1

## 2015-08-28 MED ORDER — VALPROATE SODIUM 500 MG/5ML IV SOLN
250.0000 mg | Freq: Three times a day (TID) | INTRAVENOUS | Status: DC
  Administered 2015-08-28 – 2015-08-30 (×6): 250 mg via INTRAVENOUS
  Filled 2015-08-28 (×7): qty 2.5

## 2015-08-28 MED ORDER — MIDAZOLAM HCL 2 MG/2ML IJ SOLN
INTRAMUSCULAR | Status: AC
  Administered 2015-08-28: 2 mg
  Filled 2015-08-28: qty 2

## 2015-08-28 MED ORDER — LACTULOSE ENEMA
300.0000 mL | Freq: Two times a day (BID) | ORAL | Status: DC
  Administered 2015-08-28: 300 mL via RECTAL
  Filled 2015-08-28 (×2): qty 300

## 2015-08-28 MED ORDER — DEXMEDETOMIDINE HCL IN NACL 400 MCG/100ML IV SOLN
0.2000 ug/kg/h | INTRAVENOUS | Status: AC
  Administered 2015-08-28: 1.4 ug/kg/h via INTRAVENOUS
  Administered 2015-08-28: 1.8 ug/kg/h via INTRAVENOUS
  Administered 2015-08-28: 1.602 ug/kg/h via INTRAVENOUS
  Administered 2015-08-28: 1.6 ug/kg/h via INTRAVENOUS
  Administered 2015-08-29: 1.8 ug/kg/h via INTRAVENOUS
  Administered 2015-08-29: 1.4 ug/kg/h via INTRAVENOUS
  Administered 2015-08-29 (×2): 2 ug/kg/h via INTRAVENOUS
  Filled 2015-08-28 (×10): qty 100

## 2015-08-28 MED ORDER — SODIUM PHOSPHATES 45 MMOLE/15ML IV SOLN
10.0000 mmol | Freq: Once | INTRAVENOUS | Status: AC
  Administered 2015-08-28: 10 mmol via INTRAVENOUS
  Filled 2015-08-28: qty 3.33

## 2015-08-28 MED ORDER — CLONAZEPAM 0.5 MG PO TBDP
0.5000 mg | ORAL_TABLET | Freq: Three times a day (TID) | ORAL | Status: DC
  Administered 2015-08-28 – 2015-08-31 (×7): 0.5 mg via ORAL
  Filled 2015-08-28 (×8): qty 1

## 2015-08-28 MED ORDER — MAGNESIUM SULFATE 2 GM/50ML IV SOLN
2.0000 g | Freq: Once | INTRAVENOUS | Status: AC
  Administered 2015-08-28: 2 g via INTRAVENOUS
  Filled 2015-08-28: qty 50

## 2015-08-28 MED ORDER — LACTULOSE 10 GM/15ML PO SOLN
30.0000 g | Freq: Three times a day (TID) | ORAL | Status: DC
  Administered 2015-08-28 – 2015-09-07 (×25): 30 g
  Filled 2015-08-28 (×25): qty 45

## 2015-08-28 MED ORDER — RIFAXIMIN 550 MG PO TABS
550.0000 mg | ORAL_TABLET | Freq: Three times a day (TID) | ORAL | Status: DC
  Administered 2015-08-28 – 2015-09-07 (×27): 550 mg
  Filled 2015-08-28 (×34): qty 1

## 2015-08-28 MED ORDER — BENZOCAINE 20 % MT AERO
INHALATION_SPRAY | Freq: Once | OROMUCOSAL | Status: AC
  Administered 2015-08-28: 14:00:00 via OROMUCOSAL
  Filled 2015-08-28: qty 57

## 2015-08-28 MED ORDER — CLONAZEPAM 0.1 MG/ML ORAL SUSPENSION: 0.5000 mg | Freq: Three times a day (TID) | ORAL | Status: DC

## 2015-08-28 MED ORDER — RISPERIDONE 1 MG/ML PO SOLN
2.0000 mg | Freq: Two times a day (BID) | ORAL | Status: DC
  Administered 2015-08-28 – 2015-08-31 (×6): 2 mg
  Filled 2015-08-28 (×9): qty 2

## 2015-08-28 NOTE — Progress Notes (Signed)
eLink Physician-Brief Progress Note Patient Name: James BalDaniel W Best DOB: [birth year] MRN: [id number]   Date of Service  08/28/2015  HPI/Events of Note  Bedside nurse contacted regarding assessment of abdominal x-ray for feeding tube placement. Patient also complaining of pain without pain medication ordered. Can recheck shows patient laying on left side. Family at bedside. Eyes are closed.When questioned where the patient's pain was he moaned. After repeated questioning its unclear but his pain may be abdominal in etiology.Nurse at bedside and family at bedside updated regarding plan of care. Concerned that IV pain medication could compromise neurologic and respiratory status significantly.  eICU Interventions  1. Advance nasogastric feeding tube at least 6 cm 2. Repeat abdominal x-ray after feeding tube was advanced to confirm placement 3. Holding off on pain medication at this time     Intervention Category Major Interventions: Other:  Lawanda CousinsJennings Abanoub Hanken 08/28/2015, 11:47 PM

## 2015-08-28 NOTE — Progress Notes (Signed)
eLink Physician-Brief Progress Note Patient Name: James BalDaniel W Vester DOB: July 24, 1980 MRN: [id number]   Date of Service  08/28/2015  HPI/Events of Note  Chemotherapy changes patient laying in bed sleeping. Eyes closed. Normotensive & not tachycardic.  eICU Interventions  Continue close monitoring.     Intervention Category Major Interventions: Delirium, psychosis, severe agitation - evaluation and management  Lawanda CousinsJennings Nestor 08/28/2015, 12:54 AM

## 2015-08-28 NOTE — Progress Notes (Signed)
Nutrition Follow-up  DOCUMENTATION CODES:   Not applicable  INTERVENTION:  - If pt unable to safely consume PO and if TF within GOC/POC, recommend NGT placement with Jevity 1.2 @ 70 mL/hr which will provide 2016 kcal, 93 grams of protein, and 1356 mL free water - RD will continue to monitor for needs and GOC/POC.  NUTRITION DIAGNOSIS:   Inadequate oral intake related to inability to eat as evidenced by NPO status. -ongoing  GOAL:   Patient will meet greater than or equal to 90% of their needs -unable to meet while NPO with no nutrition support.  MONITOR:   I & O's, Labs, Vent status, Weight trends  ASSESSMENT:   [name] is an 35 y.o. white male who presented with the abrupt onset of hematemesis this evening with orthostatic dizziness and also noticed a bloody stool. Had a previous normal bowel movement 2 hours earlier. He denies any pain. He is alert and oriented and has not vomited since reaching the emergency room.  Pt NPO and notes indicate that pt's mentation remains poor; pt unsafe for PO at this time. GI note 5/1 indicates CLD appropriate when mentation improves. Pulmonology NP note this AM states that Palliative Care meeting due 5/1 and that meeting will be arranged with family related to this; will monitor for Palliative Care note with associate GOC/POC.  If mentation remains poor and diet unable to be advanced to CLD per GI and if TF within wishes, TF recommendations outlined above. Pt at risk for refeeding should nutrition support be initiated.  Unable to meet needs. Medications reviewed. IVF: D5 @ 45 mL/hr (184 kcal). Labs reviewed; Cl: 113 mmol/L, BUN <5 mg/dL, Ca: 7.4 mg/dL, Phos: 2.3 mg/dL, Mg: 1.6 mg/dL, AST/ALT elevated.  Diet Order:   NPO   Skin:  Reviewed, no issues  Last BM:  4/30  Height:   Ht Readings from Last 1 Encounters:  08/21/15 5\' 7"  (1.702 m)    Weight:   Wt Readings from Last 1 Encounters:  08/22/15 167 lb 5.3 oz (75.9 kg)     Ideal Body Weight:  67.27 kg  BMI:  Body mass index is 26.2 kg/(m^2).  Estimated Nutritional Needs:   Kcal:  1900-2100  Protein:  85-95 grams  Fluid:  >/= 1.9 L/day  EDUCATION NEEDS:   No education needs identified at this time     Trenton GammonJessica Lacey Wallman, RD, LDN Inpatient Clinical Dietitian Pager # [phone] After hours/weekend pager # [phone]

## 2015-08-28 NOTE — Progress Notes (Signed)
eLink Nursing ICU Electrolyte Replacement Protocol  Patient Name: Marcie BalDaniel W Sura DOB: Sep 21, 1980 MRN: [id number]  Date of Service  08/28/2015   HPI/Events of Note    Recent Labs Lab 08/23/15 0325 08/23/15 1110 08/24/15 0500 08/25/15 0620 08/26/15 0745 08/27/15 0350 08/28/15 0407  NA 141  --  139 140 144 146* 144  K 3.4*  --  4.2 4.6 3.9 4.3 4.4  CL 111  --  109 111 113* 114* 113*  CO2 23  --  23 24 23 25 23   GLUCOSE 158*  --  115* 109* 96 127* 143*  BUN 8  --  7 8 8 7  <5*  CREATININE 1.16  --  0.92 0.83 0.75 0.81 0.73  CALCIUM 5.7*  --  6.1* 6.4* 7.0* 7.2* 7.4*  MG 0.8* 2.0 1.7  --  1.6*  --  1.6*  PHOS  --   --  1.6* 2.1* 2.0* 2.6 2.3*    Estimated Creatinine Clearance: 120.5 mL/min (by C-G formula based on Cr of 0.73).  Intake/Output      05/01 0701 - 05/02 0700   I.V. (mL/kg) 1394.8 (18.4)   Blood 335   IV Piggyback 400   Total Intake(mL/kg) 2129.8 (28.1)   Net +2129.8       Urine Occurrence 8 x   Stool Occurrence 1 x    - I/O DETAILED x24h    Total I/O In: 900.5 [I.V.:700.5; IV Piggyback:200] Out: -  - I/O THIS SHIFT    ASSESSMENT   eICURN Interventions  Labs replaced using protocol. MD notified    ASSESSMENT: MAJOR ELECTROLYTE    Merita NortonFrye, Dylann Layne Nicole 08/28/2015, 5:59 AM

## 2015-08-28 NOTE — Progress Notes (Signed)
Interdisciplinary Goals of Care Family Meeting    Date carried out: 08/28/2015  Member's involved: Physician, Bedside Registered Nurse and Family Member or next of kin  Discussion: We discussed goals of care for [name] .  Long discussion w/ father [name] and mother [name]. They understand the challenges he faces: his cirrhosis, on going delirium, in-ability to address nutritional goals and limited improvement. They remain hopeful that he will improve but also know that if he were to worsen that he would not want to be back on life support knowing the poor over-all prognosis and limited hope for recovery. Based on this we will continue current medical care. Now has FT placed from which we can now give his lactulose and rifaximin. Have also added very low dose clonazepam and scheduled Risperdal.  Code status: Full DNR  Disposition: Continue current acute care  If he were to decompensate we will not do CPR or place him back on vent.    Shelby Mattocksete E Asmara Backs 08/28/2015 3:22 PM

## 2015-08-28 NOTE — Progress Notes (Signed)
[facility] Progress Note  Subjective: Lethargic, not responsive.  Objective: Vital signs in last 24 hours: Temp:  [97.1 F (36.2 C)-98.1 F (36.7 C)] 97.1 F (36.2 C) (05/02 1600) Pulse Rate:  [32-88] 72 (05/02 1600) Resp:  [15-32] 16 (05/02 1600) BP: (98-145)/(55-90) 98/69 mmHg (05/02 1600) SpO2:  [85 %-100 %] 97 % (05/02 1600) Weight change:    PE: Heart RRR Abdomen non tender , ascites  Lab Results: Results for orders placed or performed during the hospital encounter of 08/20/15 (from the past 24 hour(s))  Phosphorus     Status: Abnormal   Collection Time: 08/28/15  4:07 AM  Result Value Ref Range   Phosphorus 2.3 (L) 2.5 - 4.6 mg/dL  CBC     Status: Abnormal   Collection Time: 08/28/15  4:07 AM  Result Value Ref Range   WBC 8.6 4.0 - 10.5 K/uL   RBC 2.95 (L) 4.22 - 5.81 MIL/uL   Hemoglobin 7.8 (L) 13.0 - 17.0 g/dL   HCT 04.525.0 (L) 40.939.0 - 81.152.0 %   MCV 84.7 78.0 - 100.0 fL   MCH 26.4 26.0 - 34.0 pg   MCHC 31.2 30.0 - 36.0 g/dL   RDW 91.421.9 (H) 78.211.5 - 95.615.5 %   Platelets 150 150 - 400 K/uL  Comprehensive metabolic panel     Status: Abnormal   Collection Time: 08/28/15  4:07 AM  Result Value Ref Range   Sodium 144 135 - 145 mmol/L   Potassium 4.4 3.5 - 5.1 mmol/L   Chloride 113 (H) 101 - 111 mmol/L   CO2 23 22 - 32 mmol/L   Glucose, Bld 143 (H) 65 - 99 mg/dL   BUN <5 (L) 6 - 20 mg/dL   Creatinine, Ser 2.130.73 0.61 - 1.24 mg/dL   Calcium 7.4 (L) 8.9 - 10.3 mg/dL   Total Protein 5.6 (L) 6.5 - 8.1 g/dL   Albumin 2.3 (L) 3.5 - 5.0 g/dL   AST 086253 (H) 15 - 41 U/L   ALT 154 (H) 17 - 63 U/L   Alkaline Phosphatase 49 38 - 126 U/L   Total Bilirubin 8.1 (H) 0.3 - 1.2 mg/dL   GFR calc non Af Amer >60 >60 mL/min   GFR calc Af Amer >60 >60 mL/min   Anion gap 8 5 - 15  Ammonia     Status: Abnormal   Collection Time: 08/28/15  4:07 AM  Result Value Ref Range   Ammonia 50 (H) 9 - 35 umol/L  Magnesium     Status: Abnormal   Collection Time: 08/28/15  4:07 AM   Result Value Ref Range   Magnesium 1.6 (L) 1.7 - 2.4 mg/dL  Procalcitonin     Status: None   Collection Time: 08/28/15  4:07 AM  Result Value Ref Range   Procalcitonin 0.35 ng/mL    Studies/Results: No results found.    Assessment: Hepatic failure  Plan:   Continue supportive care.    Gwenevere AbbotSAM F Noemi Bellissimo 08/28/2015, 5:31 PM  Pager: [phone] If no answer or after 5 PM call [phone]

## 2015-08-28 NOTE — Progress Notes (Signed)
PULMONARY / CRITICAL CARE MEDICINE   Name: [name]: [id number] DOB: 08-31-1980    ADMISSION DATE:  08/20/2015 CONSULTATION DATE: 4/25  REFERRING MD:  Triad  CHIEF COMPLAINT:  GIB  HISTORY OF PRESENT ILLNESS:   35 yo admitted after episode of hematemesis and hematochezia on 4/24 with continued blood loss till anemia was down to 6.8. He required electrolyte replacement for tetany with mg++, Ca++ and K+. PCCM called to place CVL but due to HD instability. On 4/25  he was intubated at GI request to facilitate EGD. Therefore PCCM will assume his care till stable. He drinks up to a fifth of vodka daily. Reports prior episode of bloody stools but did not seek medical attention. PCCM will assume his care for now.  SUBJECTIVE:  Still on precedex for intermittent combativeness Off neo  VITAL SIGNS: BP 116/82 mmHg  Pulse 80  Temp(Src) 97.2 F (36.2 C) (Axillary)  Resp 20  Ht  (1.702 m)  Wt 167 lb 5.3 oz (75.9 kg)  BMI 26.20 kg/m2  SpO2 93%  HEMODYNAMICS:    VENTILATOR SETTINGS:    INTAKE / OUTPUT: I/O last 3 completed shifts: In: 3041.2 [I.V.:2256.2; Blood:335; IV Piggyback:450] Out: -   PHYSICAL EXAMINATION: General: sleepy, arouses to touch, doesn't follow commands; moans HENT: NCAT, OP clear PULM : occ rhonchi that improve w. Cough. normal effort CV: RRR, no mgr GI: distended, BS+ Derm: multiple tattoos, no rash Neuro: arouses to voice and touch, slurred speech but not oriented  LABS:  BMET  Recent Labs Lab 08/26/15 0745 08/27/15 0350 08/28/15 0407  NA 144 146* 144  K 3.9 4.3 4.4  CL 113* 114* 113*  CO2 BUN 8 7 <5*  CREATININE 0.75 0.81 0.73  GLUCOSE 96 127* 143*    Electrolytes  Recent Labs Lab 08/24/15 0500  08/26/15 0745 08/27/15 0350 08/28/15 0407  CALCIUM 6.1*  < > 7.0* 7.2* 7.4*  MG 1.7  --  1.6*  --  1.6*  PHOS 1.6*  < > 2.0* 2.6 2.3*  < > = values in this interval not displayed.  CBC  Recent Labs Lab  08/26/15 0745 08/27/15 0350 08/27/15 0548 08/28/15 0407  WBC 12.0* 6.3  --  8.6  HGB 7.2* 6.1* 6.2* 7.8*  HCT 23.1* 19.7* 20.3* 25.0*  PLT 247 146*  --  150    Coag's  Recent Labs Lab 08/22/15 0450 08/23/15 1110 08/24/15 0500  INR 2.19* 2.32* 1.94*    Sepsis Markers  Recent Labs Lab 08/22/15 0450 08/25/15 1000 08/27/15 0350 08/28/15 0407  LATICACIDVEN 4.1* 1.8  --   --   PROCALCITON  --   --  0.74 0.35    ABG  Recent Labs Lab 08/22/15 0734  PHART 7.235*  PCO2ART 32.6*  PO2ART 133*    Liver Enzymes  Recent Labs Lab 08/25/15 0620 08/26/15 0745 08/28/15 0407  AST 825* 512* 253*  ALT 356* 249* 154*  ALKPHOS 64 58 49  BILITOT 6.6* 7.7* 8.1*  ALBUMIN 1.5* 2.2* 2.3*    Cardiac Enzymes  Recent Labs Lab 08/22/15 1030 08/22/15 1600 08/22/15 2334  TROPONINI <0.03 <0.03 <0.03    Glucose No results for input(s): GLUCAP in the last 168 hours.  Imaging No results found.   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 Stigmata of recent bleeding were evident and red wale signs (  nipple sign) were present. Active bleeding began during attempted banding but wall appeared to be scarred and could not successfully place a band on this area. Banding was unsuccessful despite six attempts with incomplete eradication of varices. Steady bleeding continued at the end of the procedure. These were successfully injected with 4 mL ethanolamine for hemostasis. 4/27 abd US>>> 4/27 ECHO:Left ventricle: The cavity size was normal. Systolic function was normal. The estimated ejection fraction was in the range of 55% to 60%. Wall motion was normal; there were no regional wall motion abnormalities. The transmitral flow pattern was normal. The deceleration time of the early transmitral flow velocity was normal. The pulmonary vein flow pattern was normal. The  tissue Doppler parameters were normal. Left ventricular diastolic function parameters were normal. 5/1: abd US + cirrhosis  CULTURES: 4/24 BCx2>> (-)  ANTIBIOTICS: 4/24 ceftriaxone>>5/1  SIGNIFICANT EVENTS: 4/25 intubated for EGD: see above findings 4/26: requiring neo gtt,  Am labs showing AKI, NAG metabolic acidosis & Hyperglycemia.  4/27: AKI-->resolved; NAG metabolic acidosis-->d/t hyperchloremia and volume resuscitation efforts-->resolved; Residual Lactic acidosis-->resolved; Hyperchloremia --->resolved; HyperKalemia --->resolved -->bicarb gtt stopped. Hgb stable. Extubated 4/28 still w/ sig delirium. Still pressor dependent. LFTs increased. US pending. Changed PPI to BID and octreotide stopped.  5/1 US w/ evidence of cirrhosis. Still on precedex. Off pressors; but boarderline BP. para ordered by GI; Cipro started for continued SBP empiric coverage. Still confused 5/2 still confused. Actually on higher doses of precedex in spite of haldol. Retention enemas ordered. Started on low dose depakote.   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  Bleeding has stopped, now with slow to resolve encephalopathy. Favor ICU related delirium and hepatic encephalopathy at this point. Getting blood today (5/1)-->I believe more from ICU related anemia and hemodilution. We need to have goals of care discussion w/ family. Will arranged this w/ his father today. This is a challenging situation as his delirium has not improved and we have no good way of treating it (not safe to place NGT), not a candidate for high dose Benzos, not able to take POs. Will try low dose depakote but will need to watch his LFTs closely.   ASSESSMENT / PLAN:  PULMONARY A: Respiratory failure resolved P:   Monitor O2 saturation Mobilize  Asp precautions     CARDIOVASCULAR A:  Sinus tachycardia--> resolved Hemorrhagic   Shock-->resolved residual hypotension> no clear cause, sedation vs cirrhosis related? 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 Coagulopathy in setting of cirrhosis (likely) ->s/p one more unit PRBC on 4/26; also vit K; repeat x fusion 5/1 P:  Avoid anticoagulants Transfuse to keep Hgb > 7 now that stable  INFECTIOUS A:   No overt infection, was on IV abx empirically to cover presumed varices (completed 7d rx) -->PCT, fever and WBC curve negative as of 5/2 P:   cipro day 1/x (empirically restarted 5/1)>>> US guided para ordered 5/1>>>  ENDOCRINE A:   Adrenal insuff P:   ssi  Cont stress dose steroids   NEUROLOGIC A:   ETOH withdrawal , no hx of DT's but + sz x 1; improved  Encephalopahty prob 2/2 hepatic enceph ->his delirium is not improving and we have limited choices as to what we can give him. He is not taking POs so this prevented [place] from using the rifaximin  P:   RASS goal: 0 Monitor in ICU setting Will try LOW dose depakote to see if this will stabilize  Precedex gtt --> aim to wean off if able  Lactulose started-->will need to switch to retention enema   FAMILY  - Updates: Father updated at bedside by team during  week, no one bedside 4/30.  - Inter-disciplinary family meet or Palliative Care meeting due by: May 1-->will schedule w/ his family    Simonne Martinet ACNP-BC [facility] Pulmonary/Critical Care Pager # [phone] OR # [phone] if no answer  08/28/2015, 8:54 AM

## 2015-08-29 ENCOUNTER — Inpatient Hospital Stay (HOSPITAL_COMMUNITY): Payer: Medicaid Other

## 2015-08-29 LAB — COMPREHENSIVE METABOLIC PANEL
ALK PHOS: 51 U/L (ref 38–126)
ALT: 129 U/L — AB (ref 17–63)
AST: 198 U/L — AB (ref 15–41)
Albumin: 2.2 g/dL — ABNORMAL LOW (ref 3.5–5.0)
Anion gap: 6 (ref 5–15)
BILIRUBIN TOTAL: 6.3 mg/dL — AB (ref 0.3–1.2)
BUN: 6 mg/dL (ref 6–20)
CALCIUM: 7.5 mg/dL — AB (ref 8.9–10.3)
CHLORIDE: 111 mmol/L (ref 101–111)
CO2: 25 mmol/L (ref 22–32)
CREATININE: 0.69 mg/dL (ref 0.61–1.24)
GFR calc Af Amer: >60 mL/min (ref >60)
Glucose, Bld: 135 mg/dL — ABNORMAL HIGH (ref 65–99)
Potassium: 4 mmol/L (ref 3.5–5.1)
Sodium: 142 mmol/L (ref 135–145)
Total Protein: 5.7 g/dL — ABNORMAL LOW (ref 6.5–8.1)

## 2015-08-29 LAB — GLUCOSE, CAPILLARY
Glucose-Capillary: 184 mg/dL — ABNORMAL HIGH (ref 65–99)
Glucose-Capillary: 149 mg/dL — ABNORMAL HIGH (ref 65–99)

## 2015-08-29 LAB — BODY FLUID CELL COUNT WITH DIFFERENTIAL
LYMPHS FL: 6 %
MONOCYTE-MACROPHAGE-SEROUS FLUID: 87 % (ref 50–90)
Neutrophil Count, Fluid: 7 % (ref 0–25)
Total Nucleated Cell Count, Fluid: 110 cu mm (ref 0–1000)

## 2015-08-29 LAB — PHOSPHORUS: Phosphorus: 2.3 mg/dL — ABNORMAL LOW (ref 2.5–4.6)

## 2015-08-29 LAB — PROCALCITONIN: Procalcitonin: 0.18 ng/mL

## 2015-08-29 LAB — GRAM STAIN

## 2015-08-29 LAB — ALBUMIN, FLUID (OTHER)

## 2015-08-29 LAB — MAGNESIUM: Magnesium: 1.9 mg/dL (ref 1.7–2.4)

## 2015-08-29 MED ORDER — HALOPERIDOL LACTATE 5 MG/ML IJ SOLN
5.0000 mg | Freq: Once | INTRAMUSCULAR | Status: AC
  Administered 2015-08-29: 5 mg via INTRAVENOUS
  Filled 2015-08-29: qty 1

## 2015-08-29 MED ORDER — HALOPERIDOL LACTATE 5 MG/ML IJ SOLN
2.0000 mg | Freq: Four times a day (QID) | INTRAMUSCULAR | Status: DC | PRN
  Administered 2015-08-29 – 2015-09-03 (×5): 2 mg via INTRAVENOUS
  Filled 2015-08-29 (×6): qty 1

## 2015-08-29 MED ORDER — VITAL 1.5 CAL PO LIQD
1000.0000 mL | ORAL | Status: DC
  Administered 2015-08-29: 1000 mL
  Filled 2015-08-29 (×2): qty 1000

## 2015-08-29 MED ORDER — HYDROCORTISONE NA SUCCINATE PF 100 MG IJ SOLR
50.0000 mg | Freq: Two times a day (BID) | INTRAMUSCULAR | Status: DC
  Administered 2015-08-29 – 2015-08-30 (×2): 50 mg via INTRAVENOUS
  Filled 2015-08-29 (×2): qty 2

## 2015-08-29 MED ORDER — VITAL HIGH PROTEIN PO LIQD: 1000.0000 mL | ORAL | Status: DC

## 2015-08-29 NOTE — Progress Notes (Signed)
Dermabond applied to paracentesis site . A large amt of yellow serous drainage noted continuously prior to dermabond application.

## 2015-08-29 NOTE — Progress Notes (Signed)
Patient had panda tube placed during day shift. Abdominal x-ray order placed to verify placement of panda tube. Notified e-link physician of results. Was told to advance tube further. Tube was advanced with another RN assisting. Another abdominal x-ray order was placed to verify tube placement. Notified e-link physician again of results. Was told to advance panda tube further.Another abdominal x-ray was done to verify tube placement. Now awaiting results of abdominal x-ray. Oncoming RN notified and made aware.

## 2015-08-29 NOTE — Procedures (Signed)
Ultrasound-guided diagnostic and therapeutic paracentesis performed yielding 1 liters of yellow colored fluid. No immediate complications.  Doree Kuehne E 12:08 PM 08/29/2015

## 2015-08-29 NOTE — Progress Notes (Signed)
NUTRITION NOTE  Pt seen for full follow-up 5/2 with associated note written at 1015. Spoke with Cindee LamePete, CCM NP, concerning plans to initiate trickle TF. RD recommended Jevity 1.2  On 5/2 which is not soy free. Vital 1.5 is a soy-free product and will be more appropriate for this pt for that reason. Verbal order from [place] BridgePete to order trickle TF. Will follow-up with pt 5/4. Pt at high risk for refeeding.   Will order Vital 1.5 @ 20 mL/hr which will provide 720 kcal, 32 grams of protein, and 367 mL free water. Goal for TF: Vital 1.5 @ 55 mL/hr to provide 1980 kcal, 89 grams of protein, and 1008 mL free water.   Estimated Nutritional Needs:  Kcal: 1900-2100 Protein: 85-95 grams Fluid: >/= 1.9 L/day   [name], RD, LDN Inpatient Clinical Dietitian Pager # [phone] After hours/weekend pager # [phone]

## 2015-08-29 NOTE — Progress Notes (Signed)
PULMONARY / CRITICAL CARE MEDICINE   Name: James BalDaniel W [name]: [id number] DOB: [birth year]    ADMISSION DATE:  08/20/2015 CONSULTATION DATE: 4/25  REFERRING MD:  Triad  CHIEF COMPLAINT:  GIB  HISTORY OF PRESENT ILLNESS:   35 yo admitted after episode of hematemesis and hematochezia on 4/24 with continued blood loss till anemia was down to 6.8. He required electrolyte replacement for tetany with mg++, Ca++ and K+. PCCM called to place CVL but due to HD instability. On 4/25  he was intubated at GI request to facilitate EGD. Therefore PCCM will assume his care till stable. He drinks up to a fifth of vodka daily. Reports prior episode of bloody stools but did not seek medical attention. PCCM will assume his care for now.  SUBJECTIVE:  Still on precedex for intermittent combativeness Off neo  VITAL SIGNS: BP 137/78 mmHg  Pulse 67  Temp(Src) 96.4 F (35.8 C) (Axillary)  Resp 17  Ht 5\' 7"  (1.702 m)  Wt 167 lb 5.3 oz (75.9 kg)  BMI 26.20 kg/m2  SpO2 90%  HEMODYNAMICS:    VENTILATOR SETTINGS:    INTAKE / OUTPUT: I/O last 3 completed shifts: In: 3505.1 [I.V.:2600.1; Other:200; IV Piggyback:705] Out: -   PHYSICAL EXAMINATION: General: sedated, arouses to touch, doesn't follow commands; moans at times agitated HENT: NCAT, OP clear PULM : occ rhonchi that improve w. Cough. normal effort  CV: RRR, no mgr GI: distended, BS+, shifting dullness to percussion  Derm: multiple tattoos, no rash Neuro: arouses to voice and touch, slurred speech but not oriented  LABS:  BMET  Recent Labs Lab 08/27/15 0350 08/28/15 0407 08/29/15 0607  NA 146* 144 142  K 4.3 4.4 4.0  CL 114* 113* 111  CO2 25 23 25   BUN 7 <5* 6  CREATININE 0.81 0.73 0.69  GLUCOSE 127* 143* 135*    Electrolytes  Recent Labs Lab 08/26/15 0745 08/27/15 0350 08/28/15 0407 08/29/15 0607  CALCIUM 7.0* 7.2* 7.4* 7.5*  MG 1.6*  --  1.6* 1.9  PHOS 2.0* 2.6 2.3* 2.3*    CBC  Recent Labs Lab  08/26/15 0745 08/27/15 0350 08/27/15 0548 08/28/15 0407  WBC 12.0* 6.3  --  8.6  HGB 7.2* 6.1* 6.2* 7.8*  HCT 23.1* 19.7* 20.3* 25.0*  PLT 247 146*  --  150    Coag's  Recent Labs Lab 08/23/15 1110 08/24/15 0500  INR 2.32* 1.94*    Sepsis Markers  Recent Labs Lab 08/25/15 1000 08/27/15 0350 08/28/15 0407 08/29/15 0607  LATICACIDVEN 1.8  --   --   --   PROCALCITON  --  0.74 0.35 0.18    ABG No results for input(s): PHART, PCO2ART, PO2ART in the last 168 hours.  Liver Enzymes  Recent Labs Lab 08/26/15 0745 08/28/15 0407 08/29/15 0607  AST 512* 253* 198*  ALT 249* 154* 129*  ALKPHOS 58 49 51  BILITOT 7.7* 8.1* 6.3*  ALBUMIN 2.2* 2.3* 2.2*    Cardiac Enzymes  Recent Labs Lab 08/22/15 1030 08/22/15 1600 08/22/15 2334  TROPONINI <0.03 <0.03 <0.03    Glucose No results for input(s): GLUCAP in the last 168 hours.  Imaging Dg Abd Portable 1v  08/29/2015  CLINICAL DATA:  evaluate NG tube placement EXAM: PORTABLE ABDOMEN - 1 VIEW COMPARISON:  08/29/2015 FINDINGS: The feeding tube tip is in the projection of the body as stomach. Cough mild gaseous distension of the small bowel and large bowel loops. IMPRESSION: The feeding tube tip is in the body of  the stomach. Electronically Signed   By: Signa Kell M.D.   On: 08/29/2015 07:06   Dg Abd Portable 1v  08/29/2015  CLINICAL DATA:  NG tube placement. EXAM: PORTABLE ABDOMEN - 1 VIEW COMPARISON:  08/28/2015 FINDINGS: Enteric tube tip is in the left upper quadrant consistent with location in the body of the stomach. IMPRESSION: Enteric tube tip in the left upper quadrant consistent with location in the body of the stomach. Electronically Signed   By: Burman Nieves M.D.   On: 08/29/2015 03:12   Dg Abd Portable 1v  08/28/2015  CLINICAL DATA:  Tube placement EXAM: PORTABLE ABDOMEN - 1 VIEW COMPARISON:  None. FINDINGS: An enteric tube extends below the diaphragm with tip just reaching the expected location of the  gastric lumen. IMPRESSION: Enteric tube tip is probably within the lumen at the gastric cardia, but the tube does not extend far into the stomach. Electronically Signed   By: Ellery Plunk M.D.   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 Stigmata of recent bleeding were evident and red wale signs (nipple sign) were present. Active bleeding began during attempted banding but wall appeared to be scarred and could not successfully place a band on this area. Banding was unsuccessful despite six attempts with incomplete eradication of varices. Steady bleeding continued at the end of the procedure. These were successfully injected with 4 mL ethanolamine for hemostasis. 4/27 ECHO:Left ventricle: The cavity size was normal. Systolic function was normal. The estimated ejection fraction was in the range of 55% to 60%. Wall motion was normal; there were no regional wall motion abnormalities. The transmitral flow pattern was normal. The deceleration time of the early transmitral flow velocity was normal. The pulmonary vein flow pattern was normal. The tissue Doppler parameters were normal. Left ventricular diastolic function parameters were normal. 5/1: abd [place] + cirrhosis  CULTURES: 4/24 BCx2>> (-)  ANTIBIOTICS: 4/24 ceftriaxone>>5/1 5/1 cipro>>>  SIGNIFICANT EVENTS: 4/25 intubated for EGD: see above findings 4/26: requiring neo gtt,  Am labs showing AKI, NAG metabolic acidosis & Hyperglycemia.  4/27: AKI-->resolved; NAG metabolic acidosis-->d/t hyperchloremia and volume resuscitation efforts-->resolved; Residual Lactic acidosis-->resolved; Hyperchloremia --->resolved; HyperKalemia --->resolved -->bicarb gtt stopped. Hgb stable. Extubated 4/28 still w/ sig delirium. Still pressor dependent. LFTs increased. [place] pending. Changed PPI to BID and octreotide stopped.  5/1  [place] w/ evidence of cirrhosis. Still on precedex. Off pressors; but boarderline BP. para ordered by GI; Cipro started for continued SBP empiric coverage. Still confused 5/2 still confused. Actually on higher doses of precedex in spite of haldol. Retention enemas ordered. Started on low dose depakote.  FT placed. Family conf w/ father and mother. Made DNR but continue aggressive medical care.  5/3 FT re-positioned over-night. Starting VT lactulose, rifaximin, Risperdal, and low dose clonazepam. precedex d/c'd. TF started.   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  Bleeding has stopped, now with slow to resolve encephalopathy. Favor ICU related delirium and hepatic encephalopathy at this point. This is a challenging situation as his delirium has not improved. Finally have GI route to give meds. Hope that we can now d/c precedex. Spoke w/ family. They understand the poor prognosis here. He is now DNR but w/ hopes that things may turn around w/ 24-48 more hours of treatment  for his hepatic encephalopathy. We will continue lactulose, rifaximin, low dose clonazepam, and Risperdal. Will stop precedex today. Plan for therapeutic and diagnostic para today. IF can't be done by IR I will do this afternoon. I am concerned that Mr Rybacki will not survive.   ASSESSMENT / PLAN:  PULMONARY A: Respiratory failure resolved Aspiration risk P:   Monitor O2 saturation Mobilize  Asp precautions     CARDIOVASCULAR A:  Sinus tachycardia--> resolved Hemorrhagic  Shock-->resolved residual hypotension> no clear cause, sedation vs cirrhosis related vs adrenal insuff P:  Cont tele KVO IVFs Wean Neo for MAP >55  RENAL A:   Hypokalemia and Hypomagnesemia; intermittent  Hypophophatemia Hypernatremia  Hypomagnesemia  Oliguria  P:   Free water replacement w/ D5w @ 45cc/hr  Replaced and recheck F/u am labs      GASTROINTESTINAL A:   Variceal bleed s/p EGD 4/25, injected but not able to band 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 Coagulopathy in setting of cirrhosis (likely) ->s/p one more unit PRBC on 4/26; also vit K; repeat x fusion 5/1 P:  Avoid anticoagulants Transfuse to keep Hgb > 7 now that stable   INFECTIOUS A:   No overt infection, was on IV abx empirically to cover presumed varices (completed 7d rx) -->PCT, fever and WBC curve negative as of 5/2 P:   cipro day 1/x (empirically restarted 5/1)>>> US guided para ordered 5/1>>>  ENDOCRINE A:   Adrenal insuff P:   ssi  Cont stress dose steroids; begin weaning off   NEUROLOGIC A:   ETOH withdrawal , no hx of DT's but + sz x 1;  Encephalopahty prob 2/2 hepatic enceph ->his delirium has not improved, but he did not get much in the way of his medications last evening d/t difficulty w/ his FT  RASS goal: 0 Precedex gtt --> aim to wean off TODAY Started scheduled Lactulose and rifaximin Start scheduled Risperdal and low dose clonazepam   Low dose clonidine   FAMILY  - Updates: Father updated at bedside by team during week, no one bedside 4/30.  - Inter-disciplinary family meet or Palliative Care meeting due by: May 1-->will schedule w/ his family    Simonne Martinet ACNP-BC [facility] Pulmonary/Critical Care Pager # [phone] OR # [phone] if no answer  08/29/2015, 9:42 AM

## 2015-08-29 NOTE — Progress Notes (Signed)
Dressing changed  3 times to rt abdomen since paracentesis done today.

## 2015-08-29 NOTE — Progress Notes (Addendum)
eLink Physician-Brief Progress Note Patient Name: James BalDaniel W Best DOB: [birth year] MRN: [id number]   Date of Service  08/29/2015  HPI/Events of Note  Agitated Delirium - QTc interval = 0.47 seconds.   eICU Interventions  Will order: 1. Haldol 2 mg IV Q 6 hours PRN agitation/delirium. 2. Monitor QTc interval Q 6 hours. Notify MD if QTc interval > 500 milliseconds.      Intervention Category Major Interventions: Delirium, psychosis, severe agitation - evaluation and management  Sommer,Steven Eugene 08/29/2015, 6:56 PM

## 2015-08-29 NOTE — Progress Notes (Signed)
eLink Physician-Brief Progress Note Patient Name: James BalDaniel W Lingo DOB: 02/21/1981 MRN: [id number]   Date of Service  08/29/2015  HPI/Events of Note  Notified by bedside nurse of ongoing issue with agitation. Patient currently maximized on his Precedex infusion. Reviewed abdominal x-ray with feeding tube present within body of stomach. Patient did not receive previous benzodiazepine and medications.Previous EKG showed QTC 472 ms.  eICU Interventions  1. Attributes feeding tube another 6-9 centimeter. 2. Haldol 5 mg IV now     Intervention Category Major Interventions: Delirium, psychosis, severe agitation - evaluation and management  Lawanda CousinsJennings Tanazia Achee 08/29/2015, 3:50 AM

## 2015-08-30 LAB — GLUCOSE, CAPILLARY
Glucose-Capillary: 111 mg/dL — ABNORMAL HIGH (ref 65–99)
Glucose-Capillary: 111 mg/dL — ABNORMAL HIGH (ref 65–99)
Glucose-Capillary: 113 mg/dL — ABNORMAL HIGH (ref 65–99)
Glucose-Capillary: 117 mg/dL — ABNORMAL HIGH (ref 65–99)
Glucose-Capillary: 120 mg/dL — ABNORMAL HIGH (ref 65–99)
Glucose-Capillary: 121 mg/dL — ABNORMAL HIGH (ref 65–99)

## 2015-08-30 LAB — MAGNESIUM: MAGNESIUM: 1.7 mg/dL (ref 1.7–2.4)

## 2015-08-30 LAB — PATHOLOGIST SMEAR REVIEW

## 2015-08-30 LAB — PHOSPHORUS: PHOSPHORUS: 1.4 mg/dL — AB (ref 2.5–4.6)

## 2015-08-30 MED ORDER — PROPRANOLOL HCL 20 MG/5ML PO SOLN
20.0000 mg | Freq: Three times a day (TID) | ORAL | Status: DC
  Administered 2015-08-30 – 2015-09-06 (×22): 20 mg
  Filled 2015-08-30 (×27): qty 5

## 2015-08-30 MED ORDER — PANTOPRAZOLE SODIUM 40 MG PO PACK
40.0000 mg | PACK | Freq: Two times a day (BID) | ORAL | Status: DC
  Administered 2015-08-30 – 2015-09-06 (×14): 40 mg
  Filled 2015-08-30 (×19): qty 20

## 2015-08-30 MED ORDER — VALPROATE SODIUM 500 MG/5ML IV SOLN
500.0000 mg | Freq: Three times a day (TID) | INTRAVENOUS | Status: DC
  Administered 2015-08-30 – 2015-08-31 (×3): 500 mg via INTRAVENOUS
  Filled 2015-08-30 (×4): qty 5

## 2015-08-30 MED ORDER — MAGNESIUM SULFATE 2 GM/50ML IV SOLN
2.0000 g | Freq: Once | INTRAVENOUS | Status: AC
  Administered 2015-08-30: 2 g via INTRAVENOUS
  Filled 2015-08-30: qty 50

## 2015-08-30 MED ORDER — HYDROCORTISONE NA SUCCINATE PF 100 MG IJ SOLR
25.0000 mg | Freq: Two times a day (BID) | INTRAMUSCULAR | Status: DC
  Administered 2015-08-30 – 2015-08-31 (×2): 25 mg via INTRAVENOUS
  Filled 2015-08-30 (×2): qty 2

## 2015-08-30 MED ORDER — FUROSEMIDE 10 MG/ML PO SOLN
20.0000 mg | Freq: Every day | ORAL | Status: DC
  Administered 2015-08-30 – 2015-09-07 (×9): 20 mg
  Filled 2015-08-30 (×10): qty 2

## 2015-08-30 MED ORDER — POTASSIUM PHOSPHATES 15 MMOLE/5ML IV SOLN: 24.0000 mmol | Freq: Once | INTRAVENOUS | Status: DC

## 2015-08-30 MED ORDER — SODIUM PHOSPHATES 45 MMOLE/15ML IV SOLN
20.0000 mmol | Freq: Once | INTRAVENOUS | Status: AC
  Administered 2015-08-30: 20 mmol via INTRAVENOUS
  Filled 2015-08-30: qty 6.67

## 2015-08-30 MED ORDER — VITAMINS A & D EX OINT
TOPICAL_OINTMENT | CUTANEOUS | Status: AC
  Administered 2015-08-30: 1
  Filled 2015-08-30: qty 10

## 2015-08-30 MED ORDER — SPIRONOLACTONE 25 MG PO TABS
25.0000 mg | ORAL_TABLET | Freq: Every day | ORAL | Status: DC
  Administered 2015-08-30 – 2015-08-31 (×2): 25 mg
  Filled 2015-08-30 (×2): qty 1

## 2015-08-30 MED ORDER — VITAL 1.5 CAL PO LIQD
1000.0000 mL | ORAL | Status: DC
  Administered 2015-08-30: 1000 mL
  Filled 2015-08-30 (×2): qty 1000

## 2015-08-30 MED ORDER — POTASSIUM CHLORIDE 20 MEQ/15ML (10%) PO SOLN
40.0000 meq | Freq: Every day | ORAL | Status: DC
  Administered 2015-08-30 – 2015-08-31 (×2): 40 meq
  Filled 2015-08-30 (×2): qty 30

## 2015-08-30 MED ORDER — ZINC OXIDE 40 % EX OINT
TOPICAL_OINTMENT | CUTANEOUS | Status: DC | PRN
  Administered 2015-08-30: 10:00:00 via TOPICAL
  Filled 2015-08-30: qty 57
  Filled 2015-08-30: qty 114
  Filled 2015-08-30 (×4): qty 57

## 2015-08-30 MED ORDER — CLONIDINE HCL 0.1 MG PO TABS
0.1000 mg | ORAL_TABLET | Freq: Three times a day (TID) | ORAL | Status: DC
  Administered 2015-08-30 – 2015-08-31 (×3): 0.1 mg
  Filled 2015-08-30 (×3): qty 1

## 2015-08-30 NOTE — Progress Notes (Signed)
Nutrition Follow-up  DOCUMENTATION CODES:   Not applicable  INTERVENTION:  - Continue to monitor for refeeding - Increase to Vital 1.5 @ 40 mL/hr per NP order; this will provide 1440 kcal, 65 grams of protein, and 733 mL free water.  - Goal for TF: Vital 1.5 @ 55 mL/hr which will provide 1980 kcal, 89 grams of protein, and 1008 mL free water - RD will continue to monitor for needs  NUTRITION DIAGNOSIS:   Inadequate oral intake related to inability to eat as evidenced by NPO status. -ongoing  GOAL:   Patient will meet greater than or equal to 90% of their needs -unmet with current TF rate  MONITOR:   TF tolerance, Weight trends, Labs, I & O's  ASSESSMENT:   James BalDaniel W Best is an 35 y.o. white male who presented with the abrupt onset of hematemesis this evening with orthostatic dizziness and also noticed a bloody stool. Had a previous normal bowel movement 2 hours earlier. He denies any pain. He is alert and oriented and has not vomited since reaching the emergency room.  Pt with soy allergy and Vital 1.5 is soy-free formula. Pt with NJT in place and some agitation during visit this AM. Spoke with family member at bedside who reports pt did not sleep at all overnight. Pt is currently receiving Vital 1.5 @ 20 mL/hr. Spoke with NP who states he plans to increase rate to 40 mL/hr today. RD will follow-up 5/5.   Per GI note this AM, pt with previous esophageal variceal bleeding s/p sclerotherapy with unsuccessful banding attempts and no evidence of ongoing bleeding at this time. Pt also with abdominal distention d/t ascites; bedside paracentesis 5/3 with 1 liter yellow fluid removed. GI note states that aspirate consistent with cirrhosis-mediated portal HTN. Notes indicate pt with 2+ edema.  Pt not meeting 100% estimated needs with current TF rate but will meet when TF advanced to goal. Pt remains at risk for refeeding. Medications reviewed; 20 mmol KPhos x1 dose today, 20 mg Lasix/day,  25 mg Aldactone/day, 40 mEq KCl/day. Labs reviewed; CBGs: 111 and 117 mg/dL today, Ca: 7.5 mg/dL, Phos: 1.4 mg/dL (Mg and K currently WDL).    Diet Order:   NPO   Skin:  Reviewed, no issues  Last BM:  5/4  Height:   Ht Readings from Last 1 Encounters:  08/21/15 5\' 7"  (1.702 m)    Weight:   Wt Readings from Last 1 Encounters:  08/29/15 167 lb 5.3 oz (75.9 kg)    Ideal Body Weight:  67.27 kg  BMI:  Body mass index is 26.2 kg/(m^2).  Estimated Nutritional Needs:   Kcal:  1900-2100  Protein:  85-95 grams  Fluid:  >/= 1.9 L/day  EDUCATION NEEDS:   No education needs identified at this time     Trenton GammonJessica Tanor Glaspy, RD, LDN Inpatient Clinical Dietitian Pager # [phone] After hours/weekend pager # [phone]

## 2015-08-30 NOTE — Progress Notes (Signed)
Patient has pushed out flexiseal three times this shift. Tube left out for now.

## 2015-08-30 NOTE — Progress Notes (Signed)
Date:  Aug 30, 2015 Chart reviewed for concurrent status and case management needs. Will continue to follow patient for changes and needs: encephalopathy improved remains confused, off iv precedex.  Marcelle Smilinghonda Rien Marland, BSN, [place] BlankRN3, [place]CCM   [phone]

## 2015-08-30 NOTE — Progress Notes (Signed)
PULMONARY / CRITICAL CARE MEDICINE   Name: [name]: [id number] DOB: 03-14-1981    ADMISSION DATE:  08/20/2015 CONSULTATION DATE: 4/25  REFERRING MD:  [facility]  CHIEF COMPLAINT:  GIB  HISTORY OF PRESENT ILLNESS:   35 yo admitted after episode of hematemesis and hematochezia on 4/24 with continued blood loss till anemia was down to 6.8. He required electrolyte replacement for tetany with mg++, Ca++ and K+. PCCM called to place CVL but due to HD instability. On 4/25  he was intubated at GI request to facilitate EGD. Therefore PCCM will assume his care till stable. He drinks up to a fifth of vodka daily. Reports prior episode of bloody stools but did not seek medical attention. PCCM will assume his care for now.  SUBJECTIVE:  Off precedex. Still agitated.   VITAL SIGNS: BP 164/84 mmHg  Pulse 103  Temp(Src) 98.3 F (36.8 C) (Oral)  Resp 23  Ht  (1.702 m)  Wt 167 lb 5.3 oz (75.9 kg)  BMI 26.20 kg/m2  SpO2 100%  HEMODYNAMICS:    VENTILATOR SETTINGS:    INTAKE / OUTPUT: I/O last 3 completed shifts: In: 3665.4 [I.V.:2182.9; Other:190; NG/GT:430; IV Piggyback:862.5] Out: 1300 [Urine:300; Other:1000]  PHYSICAL EXAMINATION: General: more agitated at times. Now off precedex.  HENT: NCAT, OP clear PULM : occ rhonchi that improve w. Cough. normal effort  CV: RRR, no mgr GI: distended, BS+, shifting dullness to percussion, softer than 5/3.   Derm: multiple tattoos, no rash, anasarca Neuro: sp a little mor clear. Still confused and agitated. Moves all ext  LABS:  BMET  Recent Labs Lab 08/27/15 0350 08/28/15 0407 08/29/15 0607  NA 146* 144 142  K 4.3 4.4 4.0  CL 114* 113* 111  CO2 BUN 7 <5* 6  CREATININE 0.81 0.73 0.69  GLUCOSE 127* 143* 135*    Electrolytes  Recent Labs Lab 08/27/15 0350 08/28/15 0407 08/29/15 0607 08/30/15 0450  CALCIUM 7.2* 7.4* 7.5*  --   MG  --  1.6* 1.9 1.7  PHOS 2.6 2.3* 2.3* 1.4*    CBC  Recent Labs Lab  08/26/15 0745 08/27/15 0350 08/27/15 0548 08/28/15 0407  WBC 12.0* 6.3  --  8.6  HGB 7.2* 6.1* 6.2* 7.8*  HCT 23.1* 19.7* 20.3* 25.0*  PLT 247 146*  --  150    Coag's  Recent Labs Lab 08/23/15 1110 08/24/15 0500  INR 2.32* 1.94*    Sepsis Markers  Recent Labs Lab 08/25/15 1000 08/27/15 0350 08/28/15 0407 08/29/15 0607  LATICACIDVEN 1.8  --   --   --   PROCALCITON  --  0.74 0.35 0.18    ABG No results for input(s): PHART, PCO2ART, PO2ART in the last 168 hours.  Liver Enzymes  Recent Labs Lab 08/26/15 0745 08/28/15 0407 08/29/15 0607  AST 512* 253* 198*  ALT 249* 154* 129*  ALKPHOS 58 49 51  BILITOT 7.7* 8.1* 6.3*  ALBUMIN 2.2* 2.3* 2.2*    Cardiac Enzymes No results for input(s): TROPONINI, PROBNP in the last 168 hours.  Glucose  Recent Labs Lab 08/29/15 1530 08/29/15 1933 08/29/15 2349 08/30/15 0327 08/30/15 0744  GLUCAP 184* 149* 120* 111* 117*    Imaging US Paracentesis  08/29/2015  INDICATION: 35 year old male admitted with biliary am and cirrhosis secondary to alcohol abuse. A request has been made for a diagnostic and therapeutic paracentesis. EXAM: ULTRASOUND GUIDED DIAGNOSTIC AND THERAPEUTIC PARACENTESIS MEDICATIONS: 1% lidocaine COMPLICATIONS: None immediate. PROCEDURE: Informed written consent was  obtained from the patient's parents after a discussion of the risks, benefits and alternatives to treatment. A timeout was performed prior to the initiation of the procedure. Initial ultrasound scanning demonstrates a small amount of ascites within the right upper abdominal quadrant. The right upper abdomen was prepped and draped in the usual sterile fashion. 1% lidocaine was used for local anesthesia. Following this, a 19 gauge, 7-cm, Yueh catheter was introduced under direct ultrasound guidance. An ultrasound image was saved for documentation purposes. The paracentesis was performed. The catheter was removed and a dressing was applied. The patient  tolerated the procedure well without immediate post procedural complication. FINDINGS: A total of approximately 1 L of clear yellow fluid was removed. Samples were sent to the laboratory as requested by the clinical team. IMPRESSION: Successful ultrasound-guided paracentesis yielding 1 liter of peritoneal fluid. Read by: Barnetta ChapelKelly Osborne, PA-C Electronically Signed   By: Malachy MoanHeath  McCullough M.D.   On: 08/29/2015 15:24     STUDIES:  4/25 EGD (Dr Bosie ClosSchooler): Non-bleeding grade II varices were found in the middle third of the esophagus and in the lower third of the esophagus,. Stigmata of recent bleeding were evident and red wale signs (nipple sign) were present. Active bleeding began during attempted banding but wall appeared to be scarred and could not successfully place a band on this area. Banding was unsuccessful despite six attempts with incomplete eradication of varices. Steady bleeding continued at the end of the procedure. These were successfully injected with 4 mL ethanolamine for hemostasis. 4/27 ECHO:Left ventricle: The cavity size was normal. Systolic function was normal. The estimated ejection fraction was in the range of 55% to 60%. Wall motion was normal; there were no regional wall motion abnormalities. The transmitral flow pattern was normal. The deceleration time of the early transmitral flow velocity was normal. The pulmonary vein flow pattern was normal. The tissue Doppler parameters were normal. Left ventricular diastolic function parameters were normal. 5/1: abd US + cirrhosis  CULTURES: 4/24 BCx2>> (-)  ANTIBIOTICS: 4/24 ceftriaxone>>5/1 5/1 cipro>>>  SIGNIFICANT EVENTS: 4/25 intubated for EGD: see above findings 4/26: requiring neo gtt,  Am labs showing AKI, NAG metabolic acidosis & Hyperglycemia.  4/27: AKI-->resolved; NAG metabolic acidosis-->d/t hyperchloremia and volume resuscitation efforts-->resolved; Residual Lactic acidosis-->resolved; Hyperchloremia --->resolved;  HyperKalemia --->resolved -->bicarb gtt stopped. Hgb stable. Extubated 4/28 still w/ sig delirium. Still pressor dependent. LFTs increased. US pending. Changed PPI to BID and octreotide stopped.  5/1 US w/ evidence of cirrhosis. Still on precedex. Off pressors; but boarderline BP. para ordered by GI; Cipro started for continued SBP empiric coverage. Still confused 5/2 still confused. Actually on higher doses of precedex in spite of haldol. Retention enemas ordered. Started on low dose depakote.  FT placed. Family conf w/ father and mother. Made DNR but continue aggressive medical care.  5/3 FT re-positioned over-night. Starting VT lactulose, rifaximin, Risperdal, and low dose clonazepam. precedex d/c'd. TF started.  5/4 off precedex. Still agitated. A little more hypertensive. Had Para 5/3.  ~1 liter. No evidence of SBP. Adjusting depakote, and clonidine. Adding diuretics and propranolol. Family at bedside.    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  Bleeding has stopped, now with slow to resolve encephalopathy. Favor ICU related delirium and hepatic encephalopathy at this point. This is a challenging situation as his delirium has not improved. Finally have GI route to give meds. Maybe a little better.  We will continue lactulose, rifaximin,  low dose clonazepam, and Risperdal. Will adjust depakote and clonidine. Adding propranolol and diuretic management for routine ascites and variceal prophylaxis. Family updated.   ASSESSMENT / PLAN:  PULMONARY A: Respiratory failure resolved Aspiration risk P:   Monitor O2 saturation Mobilize  Asp precautions     CARDIOVASCULAR A:  Sinus tachycardia Hemorrhagic  Shock-->resolved residual hypotension> no clear cause, sedation vs cirrhosis related vs adrenal insuff-->resovled Volume overload  P:  Cont tele KVO IVFs Add spironolactone and lasix   Add low dose propranolol    RENAL A:   Hypokalemia and Hypomagnesemia; intermittent  Hypophophatemia Hypernatremia -->resovled  Hypomagnesemia  Oliguria-->resolved.   P:   Free water replacement  KVO IVFs Replaced and recheck F/u am labs     GASTROINTESTINAL A:   Variceal bleed s/p EGD 4/25, injected but not able to band Alcoholic Cirrhosis w/ascites Transaminitis, uncertain etiology but improving Likely hepatic encephalopathy Protein cal malnutrition   P:   PPI BID  IF re-bleeds will need TIPS Lactulose scheduled Observe LFTs PRN titrate tubefeeds   HEMATOLOGIC A:   Acute Blood loss anemia from GIB (resolved); Now hgb reflecting anemia of critical illness. Coagulopathy in setting of cirrhosis (likely) ->s/p one more unit PRBC on 4/26; also vit K; repeat x fusion 5/1 P:  Avoid anticoagulants Transfuse to keep Hgb > 7 now that stable   INFECTIOUS A:   No overt infection, was on IV abx empirically to cover presumed varices (completed 7d rx) -->PCT, fever and WBC curve negative as of 5/2 -->s/p para 5/3. No evidence of SBP P:   cipro  (empirically restarted 5/1)>>>complete 5d   ENDOCRINE A:   Adrenal insuff P:   ssi  Cont stress dose steroids; Cont weaning off   NEUROLOGIC A:   ETOH withdrawal , no hx of DT's but + sz x 1;  Encephalopahty prob 2/2 hepatic enceph ->his delirium has not improved, but he did not get much in the way of his medications last evening d/t difficulty w/ his FT  RASS goal: 0 Cont scheduled Lactulose and rifaximin Start scheduled Risperdal and low dose clonazepam   Low dose clonidine Adjust up valproic acid   FAMILY  - Updates: mom and dad updated.  - Inter-disciplinary family meet or Palliative Care meeting: completed see note   Simonne Martinet ACNP-BC [facility] Pulmonary/Critical Care Pager # [phone] OR # [phone] if no answer  08/30/2015, 9:36 AM

## 2015-08-30 NOTE — Progress Notes (Signed)
Subjective: Confused, agitated, receiving haloperidol. No bleeding.  Objective: Vital signs in last 24 hours: Temp:  [96.3 F (35.7 C)-98.5 F (36.9 C)] 98.3 F (36.8 C) (05/04 0808) Pulse Rate:  [63-103] 103 (05/04 0800) Resp:  [13-30] 23 (05/04 0800) BP: (94-164)/(64-107) 164/84 mmHg (05/04 0800) SpO2:  [92 %-100 %] 100 % (05/04 0800) Weight:  [75.9 kg (167 lb 5.3 oz)] 75.9 kg (167 lb 5.3 oz) (05/03 1256) Weight change:  Last BM Date: 08/30/15  PE: GEN:  Awake but confused and agitated, unable to communicate coherently HEENT:  Nasojejunal tube in place; icteric sclera ABD:  Moderate distention, non-tender, no peritonitis SKIN:  Jaundiced with telangiectasias and ecchymoses EXT:  2+ pitting edema NEURO:  Confused  Lab Results:  Paracentesis:  Ascites WBC < 250/mm3; SAAG > 1.1 (consistent with portal hypertension and without SBP)  CBC    Component Value Date/Time   WBC 8.6 08/28/2015 0407   RBC 2.95* 08/28/2015 0407   HGB 7.8* 08/28/2015 0407   HCT 25.0* 08/28/2015 0407   PLT 150 08/28/2015 0407   MCV 84.7 08/28/2015 0407   MCH 26.4 08/28/2015 0407   MCHC 31.2 08/28/2015 0407   RDW 21.9* 08/28/2015 0407   LYMPHSABS 1.2 08/27/2015 0350   MONOABS 0.9 08/27/2015 0350   EOSABS 0.3 08/27/2015 0350   BASOSABS 0.1 08/27/2015 0350   CMP     Component Value Date/Time   NA 142 08/29/2015 0607   K 4.0 08/29/2015 0607   CL 111 08/29/2015 0607   CO2 25 08/29/2015 0607   GLUCOSE 135* 08/29/2015 0607   BUN 6 08/29/2015 0607   CREATININE 0.69 08/29/2015 0607   CALCIUM 7.5* 08/29/2015 0607   PROT 5.7* 08/29/2015 0607   ALBUMIN 2.2* 08/29/2015 0607   AST 198* 08/29/2015 0607   ALT 129* 08/29/2015 0607   ALKPHOS 51 08/29/2015 0607   BILITOT 6.3* 08/29/2015 0607   GFRNONAA >60 08/29/2015 0607   GFRAA >60 08/29/2015 [id number]   Assessment:  1. 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 No evidence  of ongoing bleeding. 2. Acute blood loss anemia.  Hgb stable in upper 7 range for the past several days. 3. Abdominal distention. Ascites part of this; paracentesis results consistent with cirrhosis-mediated portal hypertension without evidence of spontaneous bacterial peritonitis. 4. Cirrhosis, alcohol-mediated, with decompensation (bleeding, encephalopathy, ascites, coagulopathy). 5. Elevated LFTs. Multifactorial (background of cirrhosis, but current degree of elevation likely predominantly component of shock liver). 8. Confusion and agitation.  Window of alcohol withdrawal has likely largely passed.  Suspect principal consideration is hepatic encephalopathy.  Plan:  1.  Recheck MELD labs (CMP, PT/INR) tomorrow. 2.  Continue lactuose and rifaximin per tube as management of hepatic encephalopathy. 3.  5-7 day course of ciprofloxacin total, GI bleeding in patient with ascites. 4.  Management of agitation with intermittent benzodiazepines and haloperidol as needed, but using judiciously, understanding it may enhance his confusion. 5.  Patient is not liver transplant candidate, and won't be for at least 6 months (6 month alcohol cessation window for liver transplantation). 6.  Prognosis guarded. 7.  [facility] will follow.   [name],James Schwarz M 08/30/2015, 8:53 AM   Pager [phone] If no answer or after 5 PM call [phone]

## 2015-08-30 NOTE — Progress Notes (Signed)
Chaplain provided emotional and spiritual support with family at bedside.  Pt's mother and father present. They are hopeful for pt's recovery, while understanding that the "next 48-72 hours will tell [name] whether he might progress"   They are appreciative of talks with critical care re: pt's condition.    Mother expresses some worry that [name] "had been saved early on, but we don't know about his spiritual life now"  Chaplain explored their faith with her and she feels reassured that "[name] knows his path and what he has been through."  The welcome continued support as they care for [name]

## 2015-08-31 LAB — COMPREHENSIVE METABOLIC PANEL
ALBUMIN: 2.2 g/dL — AB (ref 3.5–5.0)
ALT: 107 U/L — AB (ref 17–63)
AST: 153 U/L — AB (ref 15–41)
Alkaline Phosphatase: 66 U/L (ref 38–126)
Anion gap: 6 (ref 5–15)
BUN: 7 mg/dL (ref 6–20)
CHLORIDE: 113 mmol/L — AB (ref 101–111)
CO2: 28 mmol/L (ref 22–32)
CREATININE: 0.64 mg/dL (ref 0.61–1.24)
Calcium: 7.5 mg/dL — ABNORMAL LOW (ref 8.9–10.3)
GFR calc Af Amer: >60 mL/min (ref >60)
GLUCOSE: 146 mg/dL — AB (ref 65–99)
POTASSIUM: 2.9 mmol/L — AB (ref 3.5–5.1)
SODIUM: 147 mmol/L — AB (ref 135–145)
Total Bilirubin: 8.7 mg/dL — ABNORMAL HIGH (ref 0.3–1.2)
Total Protein: 6.2 g/dL — ABNORMAL LOW (ref 6.5–8.1)

## 2015-08-31 LAB — PHOSPHORUS: PHOSPHORUS: 2.3 mg/dL — AB (ref 2.5–4.6)

## 2015-08-31 LAB — GLUCOSE, CAPILLARY
GLUCOSE-CAPILLARY: 178 mg/dL — AB (ref 65–99)
GLUCOSE-CAPILLARY: 167 mg/dL — AB (ref 65–99)
Glucose-Capillary: 148 mg/dL — ABNORMAL HIGH (ref 65–99)
Glucose-Capillary: 156 mg/dL — ABNORMAL HIGH (ref 65–99)

## 2015-08-31 LAB — MAGNESIUM: Magnesium: 2 mg/dL (ref 1.7–2.4)

## 2015-08-31 LAB — PROTIME-INR
INR: 1.99 — ABNORMAL HIGH (ref 0.00–1.49)
PROTHROMBIN TIME: 21.8 s — AB (ref 11.6–15.2)

## 2015-08-31 MED ORDER — RISPERIDONE 1 MG/ML PO SOLN
1.0000 mg | Freq: Every day | ORAL | Status: DC
  Administered 2015-08-31 – 2015-09-01 (×2): 1 mg
  Filled 2015-08-31 (×3): qty 1

## 2015-08-31 MED ORDER — RISPERIDONE 1 MG/ML PO SOLN
2.0000 mg | Freq: Every day | ORAL | Status: DC
  Filled 2015-08-31: qty 2

## 2015-08-31 MED ORDER — POTASSIUM CHLORIDE 20 MEQ/15ML (10%) PO SOLN: 40.0000 meq | Freq: Three times a day (TID) | ORAL | Status: DC

## 2015-08-31 MED ORDER — FREE WATER
240.0000 mL | Status: DC
  Administered 2015-08-31 – 2015-09-01 (×6): 240 mL

## 2015-08-31 MED ORDER — CLONAZEPAM 0.125 MG PO TBDP
0.2500 mg | ORAL_TABLET | Freq: Three times a day (TID) | ORAL | Status: DC
  Administered 2015-08-31: 0.25 mg via ORAL
  Filled 2015-08-31: qty 2

## 2015-08-31 MED ORDER — SODIUM PHOSPHATES 45 MMOLE/15ML IV SOLN: 10.0000 mmol | Freq: Once | INTRAVENOUS | Status: DC

## 2015-08-31 MED ORDER — POTASSIUM CHLORIDE 20 MEQ/15ML (10%) PO SOLN
40.0000 meq | Freq: Three times a day (TID) | ORAL | Status: DC
  Administered 2015-08-31 – 2015-09-07 (×20): 40 meq
  Filled 2015-08-31 (×20): qty 30

## 2015-08-31 MED ORDER — CLONIDINE HCL 0.1 MG PO TABS
0.1000 mg | ORAL_TABLET | Freq: Two times a day (BID) | ORAL | Status: DC
  Administered 2015-08-31 – 2015-09-01 (×3): 0.1 mg
  Filled 2015-08-31 (×3): qty 1

## 2015-08-31 MED ORDER — VITAL 1.5 CAL PO LIQD
1000.0000 mL | ORAL | Status: DC
  Administered 2015-08-31 – 2015-09-05 (×7): 1000 mL
  Filled 2015-08-31 (×10): qty 1000

## 2015-08-31 MED ORDER — VALPROATE SODIUM 250 MG/5ML PO SOLN
500.0000 mg | Freq: Three times a day (TID) | ORAL | Status: DC
  Administered 2015-08-31 – 2015-09-01 (×3): 500 mg
  Filled 2015-08-31 (×6): qty 10

## 2015-08-31 MED ORDER — RISPERIDONE 1 MG/ML PO SOLN
1.0000 mg | Freq: Every day | ORAL | Status: DC
  Filled 2015-08-31: qty 1

## 2015-08-31 MED ORDER — POTASSIUM & SODIUM PHOSPHATES 280-160-250 MG PO PACK
1.0000 | PACK | Freq: Three times a day (TID) | ORAL | Status: AC
  Administered 2015-08-31 (×3): 1 via ORAL
  Filled 2015-08-31 (×3): qty 1

## 2015-08-31 MED ORDER — VITAMINS A & D EX OINT
TOPICAL_OINTMENT | CUTANEOUS | Status: AC
  Administered 2015-08-31: 1
  Filled 2015-08-31: qty 15

## 2015-08-31 MED ORDER — SPIRONOLACTONE 25 MG PO TABS
50.0000 mg | ORAL_TABLET | Freq: Every day | ORAL | Status: DC
  Administered 2015-09-01 – 2015-09-07 (×7): 50 mg
  Filled 2015-08-31 (×7): qty 2

## 2015-08-31 MED ORDER — VITAMIN B-1 100 MG PO TABS
100.0000 mg | ORAL_TABLET | Freq: Every day | ORAL | Status: DC
  Administered 2015-08-31 – 2015-09-07 (×8): 100 mg
  Filled 2015-08-31 (×8): qty 1

## 2015-08-31 MED ORDER — FREE WATER
240.0000 mL | Freq: Four times a day (QID) | Status: DC
  Administered 2015-08-31: 240 mL

## 2015-08-31 MED ORDER — POTASSIUM CHLORIDE 20 MEQ/15ML (10%) PO SOLN
40.0000 meq | Freq: Three times a day (TID) | ORAL | Status: DC
Start: 2015-08-31 — End: 2015-08-31

## 2015-08-31 MED ORDER — MAGNESIUM SULFATE 2 GM/50ML IV SOLN
2.0000 g | Freq: Once | INTRAVENOUS | Status: AC
  Administered 2015-08-31: 2 g via INTRAVENOUS
  Filled 2015-08-31: qty 50

## 2015-08-31 MED ORDER — FOLIC ACID 1 MG PO TABS
1.0000 mg | ORAL_TABLET | Freq: Every day | ORAL | Status: DC
  Administered 2015-08-31 – 2015-09-07 (×8): 1 mg
  Filled 2015-08-31 (×9): qty 1

## 2015-08-31 MED ORDER — CLONAZEPAM 0.125 MG PO TBDP
0.2500 mg | ORAL_TABLET | Freq: Two times a day (BID) | ORAL | Status: DC
  Administered 2015-08-31 – 2015-09-02 (×4): 0.25 mg via ORAL
  Filled 2015-08-31 (×5): qty 2

## 2015-08-31 NOTE — Progress Notes (Signed)
[facility] Progress Note  Subjective: Somnolent at this time. Does not appear agitated. Family at bedside.  Objective: Vital signs in last 24 hours: Temp:  [98.3 F (36.8 C)-99.1 F (37.3 C)] 98.3 F (36.8 C) (05/05 0800) Pulse Rate:  [98] 98 (05/05 0952) Resp:  [16-32] 25 (05/05 1200) BP: (112-142)/(68-100) 112/68 mmHg (05/05 1200) SpO2:  [94 %-100 %] 95 % (05/05 1200) Weight:  [89.359 kg (197 lb)] 89.359 kg (197 lb) (05/05 1008) Weight change:    PE:  Heart regular rhythm  Lungs clear  Abdomen, nontender, ascites present  Lab Results: Results for orders placed or performed during the hospital encounter of 08/20/15 (from the past 24 hour(s))  Glucose, capillary     Status: Abnormal   Collection Time: 08/30/15  3:34 PM  Result Value Ref Range   Glucose-Capillary 113 (H) 65 - 99 mg/dL  Glucose, capillary     Status: Abnormal   Collection Time: 08/30/15 11:15 PM  Result Value Ref Range   Glucose-Capillary 121 (H) 65 - 99 mg/dL   Comment 1 Notify RN   Magnesium     Status: None   Collection Time: 08/31/15  5:00 AM  Result Value Ref Range   Magnesium 2.0 1.7 - 2.4 mg/dL  Phosphorus     Status: Abnormal   Collection Time: 08/31/15  5:00 AM  Result Value Ref Range   Phosphorus 2.3 (L) 2.5 - 4.6 mg/dL  Protime-INR     Status: Abnormal   Collection Time: 08/31/15  5:00 AM  Result Value Ref Range   Prothrombin Time 21.8 (H) 11.6 - 15.2 seconds   INR 1.99 (H) 0.00 - 1.49  Comprehensive metabolic panel     Status: Abnormal   Collection Time: 08/31/15  5:00 AM  Result Value Ref Range   Sodium 147 (H) 135 - 145 mmol/L   Potassium 2.9 (L) 3.5 - 5.1 mmol/L   Chloride 113 (H) 101 - 111 mmol/L   CO2 28 22 - 32 mmol/L   Glucose, Bld 146 (H) 65 - 99 mg/dL   BUN 7 6 - 20 mg/dL   Creatinine, Ser 4.09 0.61 - 1.24 mg/dL   Calcium 7.5 (L) 8.9 - 10.3 mg/dL   Total Protein 6.2 (L) 6.5 - 8.1 g/dL   Albumin 2.2 (L) 3.5 - 5.0 g/dL   AST 811 (H) 15 - 41 U/L   ALT 107  (H) 17 - 63 U/L   Alkaline Phosphatase 66 38 - 126 U/L   Total Bilirubin 8.7 (H) 0.3 - 1.2 mg/dL   GFR calc non Af Amer >60 >60 mL/min   GFR calc Af Amer >60 >60 mL/min   Anion gap 6 5 - 15  Glucose, capillary     Status: Abnormal   Collection Time: 08/31/15  8:22 AM  Result Value Ref Range   Glucose-Capillary 148 (H) 65 - 99 mg/dL   Comment 1 Notify RN    Comment 2 Document in Chart   Glucose, capillary     Status: Abnormal   Collection Time: 08/31/15 12:40 PM  Result Value Ref Range   Glucose-Capillary 178 (H) 65 - 99 mg/dL   Comment 1 Notify RN    Comment 2 Document in Chart     Studies/Results: No results found.    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  Hepatic encephalopathy  Ascites  Plan:   Continue supportive care.    SAM F GANEM 08/31/2015, 1:21  PM  Pager: [phone] If no answer or after 5 PM call [phone]

## 2015-08-31 NOTE — Procedures (Signed)
Bedside US:  Small amount of recurrent ascites. Would estimate ~ 1 liter Plan Will re-eval Monday. May need repeat paracentesis. In the mean time we will re-adjust diuretics.  Simonne MartinetPeter E Mcclellan Demarais ACNP-BC [facility] Pulmonary/Critical Care Pager # [phone] OR # [phone] if no answer

## 2015-08-31 NOTE — Progress Notes (Signed)
CSW consulted to assist with SA resources. PN reviewed. Pt continues to experience delirium. CSW will sign off for now. Please re consult when mental status improves.  Cori RazorJamie Daequan Kozma LCSW [phone]

## 2015-08-31 NOTE — Progress Notes (Signed)
Nutrition Follow-up  DOCUMENTATION CODES:   Not applicable  INTERVENTION:  - Will increase TF to goal: Vital 1.5 @ 55 to provide 1980 kcal, 89 grams of protein, and 1008 mL free water - Free water flush per MD/NP given ongoing edema/ascites - RD will continue to monitor for needs  NUTRITION DIAGNOSIS:   Inadequate oral intake related to inability to eat as evidenced by NPO status. -ongoing  GOAL:   Patient will meet greater than or equal to 90% of their needs -will be met with TF at goal rate  MONITOR:   TF tolerance, Weight trends, Labs, I & O's  ASSESSMENT:   [name] is an 35 y.o. white male who presented with the abrupt onset of hematemesis this evening with orthostatic dizziness and also noticed a bloody stool. Had a previous normal bowel movement 2 hours earlier. He denies any pain. He is alert and oriented and has not vomited since reaching the emergency room.  5/5 Pt with NJT currently receiving Vital 1.5 @ 40 mL/hr with 240 mL free water every 6 hours which is providing 1440 kcal, 65 grams of protein, and 1693 mL free water. Spoke with family member, who is at bedside, who reports that pt had a much better night and slept through the night. RN at bedside reports she has not been informed of plan concerning TF yet. Spoke with NP who states TF can be advanced to goal rate today; RD will order.  Goal TF: Vital 1.5 @ 55 mL/hr which will provide 1980 kcal, 89 grams of protein, and 1008 mL free water; free water flush to be per MD/NP. Goal rate TF will meet needs. No new weight since 5/3 but flow sheet indicates pt with mild-deep edema throughout body; will continue to monitor weight trends and nutrition needs will be based on closest estimated "dry weight."   Medications reviewed; 1 mg folic acid/day, 20 mg Lasix/day, 1 packet Phos-Nak every 8 hours, 40 mEq KCl every 8 hours, 25 mg Aldactone/day, 100 mg thiamine/day. IVF: D5 @ 10 mL/hr (41 kcal). Labs reviewed; CBGs:  111-148 mg/dL, Na: 147 mmol/L, Cl: 113 mmol/L, Ca: 7.5 mg/dL, AST/ALT elevated, K: 2.9 mmol/L, Phos: 2.3 mg/dL, Mg WDL.    5/4 - Pt with soy allergy and Vital 1.5 is soy-free formula. -  Pt with NJT in place and some agitation during visit this AM.  - Spoke with family member at bedside. - Pt is currently receiving Vital 1.5 @ 20 mL/hr.  - Spoke with NP who states he plans to increase rate to 40 mL/hr today.  - Per GI note this AM, pt with previous esophageal variceal bleeding s/p sclerotherapy with unsuccessful banding attempts and no evidence of ongoing bleeding at this time.  - Pt also with abdominal distention d/t ascites; bedside paracentesis 5/3 with 1 liter yellow fluid removed.  - GI note states that aspirate consistent with cirrhosis-mediated portal HTN. - Notes indicate pt with 2+ edema.   Diet Order:   NPO  Skin:  Reviewed, no issues  Last BM:  5/5  Height:   Ht Readings from Last 1 Encounters:  08/21/15 _0  (1.702 m)    Weight:   Wt Readings from Last 1 Encounters:  08/29/15 167 lb 5.3 oz (75.9 kg)    Ideal Body Weight:  67.27 kg  BMI:  Body mass index is 26.2 kg/(m^2).  Estimated Nutritional Needs:   Kcal:  1900-2100  Protein:  85-95 grams  Fluid:  >/= 1.9 L/day  EDUCATION NEEDS:   No education needs identified at this time     Jarome Matin, [place], [facility] Inpatient Clinical Dietitian Pager # [phone] After hours/weekend pager # [phone]

## 2015-08-31 NOTE — Progress Notes (Signed)
PULMONARY / CRITICAL CARE MEDICINE   Name: [name]: [id number] DOB: June 17, 1980    ADMISSION DATE:  08/20/2015 CONSULTATION DATE: 4/25  REFERRING MD:  [facility]  CHIEF COMPLAINT:  GIB  HISTORY OF PRESENT ILLNESS:   35 yo admitted after episode of hematemesis and hematochezia on 4/24 with continued blood loss till anemia was down to 6.8. He required electrolyte replacement for tetany with mg++, Ca++ and K+. PCCM called to place CVL but due to HD instability. On 4/25  he was intubated at GI request to facilitate EGD. Therefore PCCM will assume his care till stable. He drinks up to a fifth of vodka daily. Reports prior episode of bloody stools but did not seek medical attention. PCCM will assume his care for now.  SUBJECTIVE:  Off precedex. Still agitated.   VITAL SIGNS: BP 128/95 mmHg  Pulse 103  Temp(Src) 99.1 F (37.3 C) (Axillary)  Resp 20  Ht  (1.702 m)  Wt 167 lb 5.3 oz (75.9 kg)  BMI 26.20 kg/m2  SpO2 100%  HEMODYNAMICS:    VENTILATOR SETTINGS:    INTAKE / OUTPUT: I/O last 3 completed shifts: In: 3505 [I.V.:875; NG/GT:1470; IV Piggyback:1160] Out: 300 [Stool:300]  PHYSICAL EXAMINATION: General:  agitated at times. remains off precedex.  HENT: NCAT, OP clear PULM : occ rhonchi that improve w. Cough. normal effort  CV: RRR, no mgr GI: distended, BS+, shifting dullness to percussion, a little more distended  Derm: multiple tattoos, no rash, anasarca Neuro: sp a little mor clear. Still confused and agitated. Moves all ext  LABS:  BMET  Recent Labs Lab 08/28/15 0407 08/29/15 0607 08/31/15 0500  NA 144 142 147*  K 4.4 4.0 2.9*  CL 113* 111 113*  CO2 BUN <5* 6 7  CREATININE 0.73 0.69 0.64  GLUCOSE 143* 135* 146*    Electrolytes  Recent Labs Lab 08/28/15 0407 08/29/15 0607 08/30/15 0450 08/31/15 0500  CALCIUM 7.4* 7.5*  --  7.5*  MG 1.6* 1.9 1.7 2.0  PHOS 2.3* 2.3* 1.4* 2.3*    CBC  Recent Labs Lab 08/26/15 0745  08/27/15 0350 08/27/15 0548 08/28/15 0407  WBC 12.0* 6.3  --  8.6  HGB 7.2* 6.1* 6.2* 7.8*  HCT 23.1* 19.7* 20.3* 25.0*  PLT 247 146*  --  150    Coag's  Recent Labs Lab 08/31/15 0500  INR 1.99*    Sepsis Markers  Recent Labs Lab 08/25/15 1000 08/27/15 0350 08/28/15 0407 08/29/15 0607  LATICACIDVEN 1.8  --   --   --   PROCALCITON  --  0.74 0.35 0.18    ABG No results for input(s): PHART, PCO2ART, PO2ART in the last 168 hours.  Liver Enzymes  Recent Labs Lab 08/28/15 0407 08/29/15 0607 08/31/15 0500  AST 253* 198* 153*  ALT 154* 129* 107*  ALKPHOS 49 51 66  BILITOT 8.1* 6.3* 8.7*  ALBUMIN 2.3* 2.2* 2.2*    Cardiac Enzymes No results for input(s): TROPONINI, PROBNP in the last 168 hours.  Glucose  Recent Labs Lab 08/29/15 2349 08/30/15 0327 08/30/15 0744 08/30/15 1140 08/30/15 1534 08/30/15 2315  GLUCAP 120* 111* 117* 111* 113* 121*    Imaging No results found.   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 Stigmata of recent bleeding were evident and red wale signs (nipple sign) were present. Active bleeding began during attempted  banding but wall appeared to be scarred and could not successfully place a band on this area. Banding was unsuccessful despite six attempts with incomplete eradication of varices. Steady bleeding continued at the end of the procedure. These were successfully injected with 4 mL ethanolamine for hemostasis. 4/27 ECHO:Left ventricle: The cavity size was normal. Systolic function was normal. The estimated ejection fraction was in the range of 55% to 60%. Wall motion was normal; there were no regional wall motion abnormalities. The transmitral flow pattern was normal. The deceleration time of the early transmitral flow velocity was normal. The pulmonary vein flow pattern was normal. The tissue Doppler parameters were  normal. Left ventricular diastolic function parameters were normal. 5/1: abd US + cirrhosis  CULTURES: 4/24 BCx2>> (-)  ANTIBIOTICS: 4/24 ceftriaxone>>5/1 5/1 cipro>>>  SIGNIFICANT EVENTS: 4/25 intubated for EGD: see above findings 4/26: requiring neo gtt,  Am labs showing AKI, NAG metabolic acidosis & Hyperglycemia.  4/27: AKI-->resolved; NAG metabolic acidosis-->d/t hyperchloremia and volume resuscitation efforts-->resolved; Residual Lactic acidosis-->resolved; Hyperchloremia --->resolved; HyperKalemia --->resolved -->bicarb gtt stopped. Hgb stable. Extubated 4/28 still w/ sig delirium. Still pressor dependent. LFTs increased. US pending. Changed PPI to BID and octreotide stopped.  5/1 US w/ evidence of cirrhosis. Still on precedex. Off pressors; but boarderline BP. para ordered by GI; Cipro started for continued SBP empiric coverage. Still confused 5/2 still confused. Actually on higher doses of precedex in spite of haldol. Retention enemas ordered. Started on low dose depakote.  FT placed. Family conf w/ father and mother. Made DNR but continue aggressive medical care.  5/3 FT re-positioned over-night. Starting VT lactulose, rifaximin, Risperdal, and low dose clonazepam. precedex d/c'd. TF started.  5/4 off precedex. Still agitated. A little more hypertensive. Had Para 5/3.  ~1 liter. No evidence of SBP. Adjusting depakote, and clonidine. Adding diuretics and propranolol. Family at bedside.  5/5: a little better MS wise. Backing off on clonazepam and risperidone. Ascites a little worse. Increasing TF to goal.    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  Bleeding has stopped, now with slow to resolve encephalopathy. Favor ICU related delirium and hepatic encephalopathy at this point. This is a challenging situation as his delirium has not improved much until the last 24 hrs. Actually a  little better.  We will continue lactulose, rifaximin, depakote and clonidine. Have made adjustments to his  low dose clonazepam, and Risperdal. Added free water for hypernatremia.Added propranolol and diuretic management for routine ascites and variceal prophylaxis and seems to be tolerating this. He does have increased ascites by exam. We will look w/ US again. May end up needed repeat US for resp mechanics.    ASSESSMENT / PLAN:  PULMONARY A: Respiratory failure resolved Aspiration risk P:   Monitor O2 saturation Mobilize  Asp precautions     CARDIOVASCULAR A:  Sinus tachycardia Hemorrhagic  Shock-->resolved residual hypotension> no clear cause, sedation vs cirrhosis related vs adrenal insuff-->resovled Volume overload  P:  Cont tele KVO IVFs Added spironolactone and lasix 5/4 Added low dose propranolol 5/4  RENAL A:   Hypokalemia and Hypomagnesemia; intermittent  Hypophophatemia Hypernatremia  P:   Free water replacement  KVO IVFs Replaced and recheck F/u am labs     GASTROINTESTINAL A:   Variceal bleed s/p EGD 4/25, injected but not able to band Alcoholic Cirrhosis w/ascites-->worse again 5/6 Transaminitis, uncertain etiology but improving Likely hepatic encephalopathy Protein cal malnutrition   P:  PPI BID  IF re-bleeds will need TIPS Lactulose scheduled Observe LFTs PRN titrate tubefeeds-->up to 55 cc/hr (goal)   HEMATOLOGIC A:   Acute Blood loss anemia from GIB (resolved); Now hgb reflecting anemia of critical illness. Coagulopathy in setting of cirrhosis (likely) ->s/p one more unit PRBC on 4/26; also vit K; repeat x fusion 5/1 P:  Avoid anticoagulants PAS Transfuse to keep Hgb > 7 now that stable   INFECTIOUS A:   No overt infection, was on IV abx empirically to cover presumed varices (completed 7d rx) -->PCT, fever and WBC curve negative as of 5/2 -->s/p para 5/3. No evidence of SBP P:   cipro  (empirically restarted 5/1)>>>complete 5d  (stop date placed)   ENDOCRINE A:   Adrenal insuff P:   ssi  Dc steroids   NEUROLOGIC A:   ETOH withdrawal , no hx of DT's but + sz x 1;  Encephalopahty prob 2/2 hepatic enceph ->he is finally making some progress from a delirium stand-point P: RASS goal: 0 Cont scheduled Lactulose and rifaximin Continue scheduled Risperdal (but change to 1mg  in am and 2 mg at HS)-->ultimate goal is to stop.  Decrease clonazepam from 0.5 tid to 0.25 tid -->ultimately wean off Low dose clonidine Adjusted up valproic acid -->will need f/u LFTs Monday  Maximize day-time interaction and attempt to get day/night sleep/wake schedule back  FAMILY  - Updates: mom and dad updated.  - Inter-disciplinary family meet or Palliative Care meeting: completed see note   Simonne Martinet ACNP-BC [facility] Pulmonary/Critical Care Pager # [phone] OR # [phone] if no answer  08/31/2015, 8:38 AM

## 2015-08-31 NOTE — Progress Notes (Signed)
Off service Discharge Summary       Patient ID: [name]: [id number] DOB/AGE: 12-08-1980 35 y.o.  Admit date: 08/20/2015 Discharge date: 08/31/2015  Discharge Diagnoses:   Respiratory failure (resolved) Aspiration risk Sinus tachycardia Hemorrhagic Shock  (resolved) residual hypotension Volume overload  Hypokalemia and Hypomagnesemia; intermittent  Hypophophatemia Hypernatremia Variceal bleed s/p EGD 4/25, injected but not able to band Alcoholic Cirrhosis  ascites Transaminitis, uncertain etiology but improving Likely hepatic encephalopathy Protein cal malnutrition  Acute Blood loss anemia from GIB (resolved) Coagulopathy in setting of cirrhosis (likely) Adrenal insuff  ETOH withdrawal Seizure sz  Hepatic Encephalopahty   Detailed Hospital Course:  35 yo admitted after episode of hematemesis and hematochezia on 4/24 with continued blood loss till anemia was down to 6.8. He required electrolyte replacement for tetany with mg++, Ca++ and K+. PCCM called to place CVL but due to HD instability. On 4/25 he was intubated at GI request to facilitate EGD. Therefore PCCM will assume his care till stable. He drinks up to a fifth of vodka daily. Reports prior episode of bloody stools but did not seek medical attention. PCCM was asked to assume his care for now. The high lights of his ICU course were as follows: 4/25 intubated for EGD:  4/25 EGD (Dr Bosie Clos): Non-bleeding grade II varices were found in the middle third of the esophagus and in the lower third of the esophagus,. Stigmata of recent bleeding were evident and red wale signs (nipple sign) were present. Active bleeding began during attempted banding but wall appeared to be scarred and could not successfully place a band on this area. Banding was unsuccessful despite six attempts with incomplete eradication of varices. Steady bleeding continued at the end of the procedure. These were successfully injected with 4  mL ethanolamine for hemostasis. 4/26: requiring neo gtt, Am labs showing AKI, NAG metabolic acidosis & Hyperglycemia.  4/27: AKI-->resolved; NAG metabolic acidosis-->d/t hyperchloremia and volume resuscitation efforts-->resolved; Residual Lactic acidosis-->resolved; Hyperchloremia --->resolved; HyperKalemia --->resolved -->bicarb gtt stopped. Hgb stable. Extubated 4/28 still w/ sig delirium. Still pressor dependent. LFTs increased. [place] pending. Changed PPI to BID and octreotide stopped.  5/1 [place] w/ evidence of cirrhosis. Still on precedex. Off pressors; but boarderline BP. para ordered by GI; Cipro started for continued SBP empiric coverage. Still confused 5/2 still confused. Actually on higher doses of precedex in spite of haldol. Retention enemas ordered. Started on low dose depakote. FT placed. Family conf w/ father and mother. Made DNR but continue aggressive medical care.  5/3 FT re-positioned over-night. Starting VT lactulose, rifaximin, Risperdal, and low dose clonazepam. precedex d/c'd. TF started.  5/4 off precedex. Still agitated. A little more hypertensive. Had Para 5/3. ~1 liter. No evidence of SBP. Adjusting depakote, and clonidine. Adding diuretics and propranolol. Family at bedside.  5/5: a little better MS wise. Backing off on clonazepam and risperidone. Ascites a little worse. Increasing TF to goal.  5/6 IM service asked to re-assume care. PCCM staying as consult to assist in weaning complex delirium regimen.    Discharge Plan by active problems   Variceal bleed s/p EGD 4/25, injected but not able to band Alcoholic Cirrhosis w/ascites-->worse again 5/6 Protein cal malnutrition  Plan:  PPI BID  IF re-bleeds will need TIPS Lactulose scheduled Observe LFTs PRN titrate tubefeeds-->up to 55 cc/hr (goal)  ETOH withdrawal , no hx of DT's but + sz x 1;  Encephalopahty prob 2/2 hepatic enceph ->he is finally making some progress from a delirium stand-point Plan: RASS  goal: 0 Cont scheduled Lactulose and rifaximin Continue scheduled Risperdal (but change to 1mg  in am and 2 mg at HS)-->ultimate goal is to stop.  Decrease clonazepam from 0.5 tid to 0.25 tid -->ultimately wean off Low dose clonidine Adjusted up valproic acid -->will need f/u LFTs Monday  Maximize day-time interaction and attempt to get day/night sleep/wake schedule back   Residual anemia s/p UGIB and prolonged critical illness Coagulopathy in setting of cirrhosis (likely) Plan:  Avoid anticoagulants PAS Transfuse to keep Hgb > 7 now that stable    Aspiration risk Plan:  Monitor O2 saturation Mobilize  Asp precautions   Volume overload  Plan:  Cont tele KVO IVFs Added spironolactone and lasix 5/4 Added low dose propranolol 5/4  Hypokalemia and Hypomagnesemia; intermittent  Hypophophatemia Hypernatremia Plan:  Free water replacement  KVO IVFs Replaced and recheck F/u am labs     Significant Hospital tests/ studies  Consults: [facility]  4/25 EGD (Dr Bosie ClosSchooler): Non-bleeding grade II varices were found in the middle third of the esophagus and in the lower third of the esophagus,. Stigmata of recent bleeding were evident and red wale signs (nipple sign) were present. Active bleeding began during attempted banding but wall appeared to be scarred and could not successfully place a band on this area. Banding was unsuccessful despite six attempts with incomplete eradication of varices. Steady bleeding continued at the end of the procedure. These were successfully injected with 4 mL ethanolamine for hemostasis. 4/27 ECHO:Left ventricle: The cavity size was normal. Systolic function was normal. The estimated ejection fraction was in the range of 55% to 60%. Wall motion was normal; there were no regional wall motion abnormalities. The transmitral flow pattern was normal. The deceleration time of the early transmitral flow velocity was normal. The pulmonary vein flow  pattern was normal. The tissue Doppler parameters were normal. Left ventricular diastolic function parameters were normal. 5/1: abd US + cirrhosis  CULTURES: 4/24 BCx2>> (-)  ANTIBIOTICS: 4/24 ceftriaxone>>5/1 5/1 cipro>>>   Discharge Exam: BP 139/93 mmHg  Pulse 98  Temp(Src) 98.3 F (36.8 C) (Oral)  Resp 22  Ht 5\' 7"  (1.702 m)  Wt 197 lb (89.359 kg)  BMI 30.85 kg/m2  SpO2 96%  General: agitated at times. remains off precedex.  HENT: NCAT, OP clear PULM : occ rhonchi that improve w. Cough. normal effort  CV: RRR, no mgr GI: distended, BS+, shifting dullness to percussion, a little more distended  Derm: multiple tattoos, no rash, anasarca Neuro: sp a little mor clear. Still confused and agitated. Moves all ext   Labs at discharge Lab Results  Component Value Date   CREATININE 0.64 08/31/2015   BUN 7 08/31/2015   NA 147* 08/31/2015   K 2.9* 08/31/2015   CL 113* 08/31/2015   CO2 28 08/31/2015   Lab Results  Component Value Date   WBC 8.6 08/28/2015   HGB 7.8* 08/28/2015   HCT 25.0* 08/28/2015   MCV 84.7 08/28/2015   PLT 150 08/28/2015   Lab Results  Component Value Date   ALT 107* 08/31/2015   AST 153* 08/31/2015   ALKPHOS 66 08/31/2015   BILITOT 8.7* 08/31/2015   Lab Results  Component Value Date   INR 1.99* 08/31/2015   INR 1.94* 08/24/2015   INR 2.32* 08/23/2015    Current radiology studies No results found.  Disposition:  01-Home or Self Care      Discharged Condition: SDU setting w/ [facility]   Simonne MartinetPeter E Sadrac Zeoli ACNP-BC [facility] Pulmonary/Critical Care  Pager # [phone] OR # [phone] if no answer  Signed: Shelby Mattocks 08/31/2015, 2:25 PM

## 2015-09-01 DIAGNOSIS — Z7189 Other specified counseling: Secondary | ICD-10-CM | POA: Insufficient documentation

## 2015-09-01 DIAGNOSIS — R140 Abdominal distension (gaseous): Secondary | ICD-10-CM

## 2015-09-01 DIAGNOSIS — E876 Hypokalemia: Secondary | ICD-10-CM

## 2015-09-01 DIAGNOSIS — Z515 Encounter for palliative care: Secondary | ICD-10-CM

## 2015-09-01 DIAGNOSIS — R188 Other ascites: Secondary | ICD-10-CM | POA: Insufficient documentation

## 2015-09-01 DIAGNOSIS — E8809 Other disorders of plasma-protein metabolism, not elsewhere classified: Secondary | ICD-10-CM

## 2015-09-01 DIAGNOSIS — K922 Gastrointestinal hemorrhage, unspecified: Secondary | ICD-10-CM | POA: Insufficient documentation

## 2015-09-01 DIAGNOSIS — R579 Shock, unspecified: Secondary | ICD-10-CM

## 2015-09-01 DIAGNOSIS — R578 Other shock: Secondary | ICD-10-CM | POA: Insufficient documentation

## 2015-09-01 LAB — COMPREHENSIVE METABOLIC PANEL
ALT: 88 U/L — ABNORMAL HIGH (ref 17–63)
AST: 124 U/L — AB (ref 15–41)
Albumin: 2 g/dL — ABNORMAL LOW (ref 3.5–5.0)
Alkaline Phosphatase: 76 U/L (ref 38–126)
Anion gap: 8 (ref 5–15)
BILIRUBIN TOTAL: 7.6 mg/dL — AB (ref 0.3–1.2)
BUN: 11 mg/dL (ref 6–20)
CHLORIDE: 115 mmol/L — AB (ref 101–111)
CO2: 28 mmol/L (ref 22–32)
Calcium: 7.5 mg/dL — ABNORMAL LOW (ref 8.9–10.3)
Creatinine, Ser: 0.76 mg/dL (ref 0.61–1.24)
Glucose, Bld: 157 mg/dL — ABNORMAL HIGH (ref 65–99)
POTASSIUM: 3 mmol/L — AB (ref 3.5–5.1)
Sodium: 151 mmol/L — ABNORMAL HIGH (ref 135–145)
TOTAL PROTEIN: 5.8 g/dL — AB (ref 6.5–8.1)

## 2015-09-01 LAB — GLUCOSE, CAPILLARY
GLUCOSE-CAPILLARY: 142 mg/dL — AB (ref 65–99)
GLUCOSE-CAPILLARY: 153 mg/dL — AB (ref 65–99)
GLUCOSE-CAPILLARY: 159 mg/dL — AB (ref 65–99)
Glucose-Capillary: 153 mg/dL — ABNORMAL HIGH (ref 65–99)
Glucose-Capillary: 153 mg/dL — ABNORMAL HIGH (ref 65–99)
Glucose-Capillary: 127 mg/dL — ABNORMAL HIGH (ref 65–99)

## 2015-09-01 LAB — CBC
HCT: 23.7 % — ABNORMAL LOW (ref 39.0–52.0)
HEMOGLOBIN: 7.4 g/dL — AB (ref 13.0–17.0)
MCH: 25.8 pg — AB (ref 26.0–34.0)
MCHC: 31.2 g/dL (ref 30.0–36.0)
MCV: 82.6 fL (ref 78.0–100.0)
PLATELETS: 115 10*3/uL — AB (ref 150–400)
RBC: 2.87 MIL/uL — AB (ref 4.22–5.81)
RDW: 22.8 % — ABNORMAL HIGH (ref 11.5–15.5)
WBC: 12.3 10*3/uL — ABNORMAL HIGH (ref 4.0–10.5)

## 2015-09-01 LAB — AMMONIA: AMMONIA: 41 umol/L — AB (ref 9–35)

## 2015-09-01 MED ORDER — VALPROATE SODIUM 250 MG/5ML PO SOLN
500.0000 mg | Freq: Two times a day (BID) | ORAL | Status: DC
  Administered 2015-09-01: 500 mg
  Filled 2015-09-01 (×3): qty 10

## 2015-09-01 MED ORDER — FREE WATER
400.0000 mL | Status: DC
  Administered 2015-09-01 – 2015-09-06 (×29): 400 mL

## 2015-09-01 MED ORDER — RISPERIDONE 1 MG/ML PO SOLN
1.0000 mg | Freq: Every day | ORAL | Status: DC
  Administered 2015-09-01 – 2015-09-05 (×5): 1 mg
  Filled 2015-09-01 (×7): qty 1

## 2015-09-01 MED ORDER — FUROSEMIDE 10 MG/ML IJ SOLN
20.0000 mg | Freq: Once | INTRAMUSCULAR | Status: AC
  Administered 2015-09-01: 20 mg via INTRAVENOUS
  Filled 2015-09-01: qty 2

## 2015-09-01 NOTE — Progress Notes (Signed)
PULMONARY / CRITICAL CARE MEDICINE   Name: [name] W Czerwonka MRN: [id number] DOB: [birth year]    ADMISSION DATE:  08/20/2015 CONSULTATION DATE: 4/25  REFERRING MD:  [facility]  CHIEF COMPLAINT:  GIB  HISTORY OF PRESENT ILLNESS:   35 yo admitted after episode of hematemesis and hematochezia on 4/24 with continued blood loss till anemia was down to 6.8. He required electrolyte replacement for tetany with mg++, Ca++ and K+. PCCM called to place CVL but due to HD instability. On 4/25  he was intubated at GI request to facilitate EGD. Therefore PCCM will assume his care till stable. He drinks up to a fifth of vodka daily. Reports prior episode of bloody stools but did not seek medical attention. PCCM will assume his care for now.  SUBJECTIVE:  Off precedex. Still agitated.   VITAL SIGNS: BP 115/83 mmHg  Pulse 98  Temp(Src) 100 F (37.8 C) (Axillary)  Resp 36  Ht 5\' 7"  (1.702 m)  Wt 89.359 kg (197 lb)  BMI 30.85 kg/m2  SpO2 94%  HEMODYNAMICS:    VENTILATOR SETTINGS:    INTAKE / OUTPUT: I/O last 3 completed shifts: In: 2676.1 [I.V.:290; Other:110; NG/GT:1721.1; IV Piggyback:555] Out: -   PHYSICAL EXAMINATION: General: moaning HENT: NCAT, OP clear PULM : normal effort, no wheeze or crackles CV: RRR, no mgr GI: distended, BS+, shifting dullness to percussion Derm: multiple tattoos, sacral erythema prob stage 1 without skin breakdown Neuro: very somnolent, not interacting or speaking, moves B UE  LABS:  BMET  Recent Labs Lab 08/29/15 0607 08/31/15 0500 09/01/15 0500  NA 142 147* 151*  K 4.0 2.9* 3.0*  CL 111 113* 115*  CO2 25 28 28   BUN 6 7 11   CREATININE 0.69 0.64 0.76  GLUCOSE 135* 146* 157*    Electrolytes  Recent Labs Lab 08/29/15 0607 08/30/15 0450 08/31/15 0500 09/01/15 0500  CALCIUM 7.5*  --  7.5* 7.5*  MG 1.9 1.7 2.0  --   PHOS 2.3* 1.4* 2.3*  --     CBC  Recent Labs Lab 08/26/15 0745 08/27/15 0350 08/27/15 0548 08/28/15 0407  WBC 12.0*  6.3  --  8.6  HGB 7.2* 6.1* 6.2* 7.8*  HCT 23.1* 19.7* 20.3* 25.0*  PLT 247 146*  --  150    Coag's  Recent Labs Lab 08/31/15 0500  INR 1.99*    Sepsis Markers  Recent Labs Lab 08/25/15 1000 08/27/15 0350 08/28/15 0407 08/29/15 0607  LATICACIDVEN 1.8  --   --   --   PROCALCITON  --  0.74 0.35 0.18    ABG No results for input(s): PHART, PCO2ART, PO2ART in the last 168 hours.  Liver Enzymes  Recent Labs Lab 08/29/15 0607 08/31/15 0500 09/01/15 0500  AST 198* 153* 124*  ALT 129* 107* 88*  ALKPHOS 51 66 76  BILITOT 6.3* 8.7* 7.6*  ALBUMIN 2.2* 2.2* 2.0*    Cardiac Enzymes No results for input(s): TROPONINI, PROBNP in the last 168 hours.  Glucose  Recent Labs Lab 08/31/15 0822 08/31/15 1240 08/31/15 1625 08/31/15 2018 09/01/15 0028 09/01/15 0444  GLUCAP 148* 178* 167* 156* 159* 142*    Imaging No results found.   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 Stigmata of recent bleeding were evident and red wale signs (nipple sign) were present. Active bleeding began during attempted banding but wall appeared to be scarred and could not  successfully place a band on this area. Banding was unsuccessful despite six attempts with incomplete eradication of varices. Steady bleeding continued at the end of the procedure. These were successfully injected with 4 mL ethanolamine for hemostasis. 4/27 ECHO:Left ventricle: The cavity size was normal. Systolic function was normal. The estimated ejection fraction was in the range of 55% to 60%. Wall motion was normal; there were no regional wall motion abnormalities. The transmitral flow pattern was normal. The deceleration time of the early transmitral flow velocity was normal. The pulmonary vein flow pattern was normal. The tissue Doppler parameters were normal. Left ventricular diastolic function parameters were  normal. 5/1: abd [place] + cirrhosis  CULTURES: 4/24 BCx2>> (-)  ANTIBIOTICS: 4/24 ceftriaxone>>5/1 5/1 cipro>>>  SIGNIFICANT EVENTS: 4/25 intubated for EGD: see above findings 4/26: requiring neo gtt,  Am labs showing AKI, NAG metabolic acidosis & Hyperglycemia.  4/27: AKI-->resolved; NAG metabolic acidosis-->d/t hyperchloremia and volume resuscitation efforts-->resolved; Residual Lactic acidosis-->resolved; Hyperchloremia --->resolved; HyperKalemia --->resolved -->bicarb gtt stopped. Hgb stable. Extubated 4/28 still w/ sig delirium. Still pressor dependent. LFTs increased. [place] pending. Changed PPI to BID and octreotide stopped.  5/1 [place] w/ evidence of cirrhosis. Still on precedex. Off pressors; but boarderline BP. para ordered by GI; Cipro started for continued SBP empiric coverage. Still confused 5/2 still confused. Actually on higher doses of precedex in spite of haldol. Retention enemas ordered. Started on low dose depakote.  FT placed. Family conf w/ father and mother. Made DNR but continue aggressive medical care.  5/3 FT re-positioned over-night. Starting VT lactulose, rifaximin, Risperdal, and low dose clonazepam. precedex d/c'd. TF started.  5/4 off precedex. Still agitated. A little more hypertensive. Had Para 5/3.  ~1 liter. No evidence of SBP. Adjusting depakote, and clonidine. Adding diuretics and propranolol. Family at bedside.  5/5: a little better MS wise. Backing off on clonazepam and risperidone. Ascites a little worse. Increasing TF to goal.    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  Bleeding has stopped, now with slow to resolve encephalopathy. Favor ICU related delirium and hepatic encephalopathy at this point. This is a challenging situation as his delirium has not improved much until 5/4, and now he is hypersomnolent. We will continue lactulose, rifaximin, depakote and  clonidine. Will further adjust Risperdal 5/6, consider decrease clonazepam 5/7. Added free water for hypernatremia - will increase 5/6. Marland KitchenAdded propranolol and diuretic management for routine ascites and variceal prophylaxis and seems to be tolerating this. He does have increased ascites by exam. We will look w/ [place] again. May end up needed repeat US paracentesis for resp mechanics.    ASSESSMENT / PLAN:  PULMONARY A: Respiratory failure resolved Aspiration risk P:   Monitor O2 saturation Mobilize  Asp precautions     CARDIOVASCULAR A:  Sinus tachycardia Hemorrhagic  Shock-->resolved residual hypotension> no clear cause, sedation vs cirrhosis related vs adrenal insuff-->resovled Volume overload  P:  Cont tele KVO IVFs Added spironolactone and lasix 5/4 Added low dose propranolol 5/4  RENAL A:   Hypokalemia and Hypomagnesemia; intermittent  Hypophophatemia Hypernatremia  P:   Free water replacement increased 5/6 KVO IVFs Replaced and recheck F/u am labs     GASTROINTESTINAL A:   Variceal bleed s/p EGD 4/25, injected but not able to band Alcoholic Cirrhosis w/ascites-->worse again 5/6 Transaminitis, uncertain etiology but improving Likely hepatic encephalopathy Protein cal malnutrition   P:   PPI BID  IF re-bleeds will need TIPS  Lactulose scheduled Observe LFTs PRN titrate tubefeeds-->up to 55 cc/hr (goal)   HEMATOLOGIC A:   Acute Blood loss anemia from GIB (resolved); Now hgb reflecting anemia of critical illness. Coagulopathy in setting of cirrhosis (likely) ->s/p one more unit PRBC on 4/26; also vit K; repeat x fusion 5/1 P:  Avoid anticoagulants PAS Transfuse to keep Hgb > 7 now that stable   INFECTIOUS A:   No overt infection, was on IV abx empirically to cover presumed varices (completed 7d rx) -->PCT, fever and WBC curve negative as of 5/2 -->s/p para 5/3. No evidence of SBP P:   cipro completed 5/1-5/5   ENDOCRINE A:   Adrenal insuff P:    ssi  Off steroids   NEUROLOGIC A:   ETOH withdrawal , no hx of DT's but + sz x 1;  Encephalopahty prob 2/2 hepatic enceph >> appears to be more somnolent, probably over medicated slightly today. Will adjust down as below P: RASS goal: 0 Cont scheduled Lactulose and rifaximin Continue scheduled Risperdal (but change to 1mg  in am and 1 mg at HS)-->ultimate goal is to stop.  Clonazepam 0.25 tid --> plan to BID on 5/7 Low dose clonidine Adjusted up valproic acid back to BID on 5/7 -->will need f/u LFTs Monday  Maximize day-time interaction and attempt to get day/night sleep/wake schedule back  FAMILY  - Updates: dad updated.  - Inter-disciplinary family meet or Palliative Care meeting: completed see note    Levy Pupa, MD, PhD 09/01/2015, 8:21 AM [facility] [phone] or if no answer [phone]

## 2015-09-01 NOTE — Progress Notes (Signed)
[facility] Progress Note  Subjective: Somnolent last night below more active this morning. Having stools and making good urine  Objective: Vital signs in last 24 hours: Temp:  [98.3 F (36.8 C)-100 F (37.8 C)] 100 F (37.8 C) (05/06 0400) Pulse Rate:  [98] 98 (05/05 0952) Resp:  [15-36] 36 (05/06 0600) BP: (94-142)/(56-100) 115/83 mmHg (05/06 0600) SpO2:  [94 %-100 %] 94 % (05/06 0600) Weight:  [89.359 kg (197 lb)] 89.359 kg (197 lb) (05/05 1008) Weight change:    PE: Limbs restless but will not open her eyes or respond to verbal command  Lab Results: Results for orders placed or performed during the hospital encounter of 08/20/15 (from the past 24 hour(s))  Glucose, capillary     Status: Abnormal   Collection Time: 08/31/15  8:22 AM  Result Value Ref Range   Glucose-Capillary 148 (H) 65 - 99 mg/dL   Comment 1 Notify RN    Comment 2 Document in Chart   Glucose, capillary     Status: Abnormal   Collection Time: 08/31/15 12:40 PM  Result Value Ref Range   Glucose-Capillary 178 (H) 65 - 99 mg/dL   Comment 1 Notify RN    Comment 2 Document in Chart   Glucose, capillary     Status: Abnormal   Collection Time: 08/31/15  4:25 PM  Result Value Ref Range   Glucose-Capillary 167 (H) 65 - 99 mg/dL   Comment 1 Notify RN    Comment 2 Document in Chart   Glucose, capillary     Status: Abnormal   Collection Time: 08/31/15  8:18 PM  Result Value Ref Range   Glucose-Capillary 156 (H) 65 - 99 mg/dL   Comment 1 Notify RN    Comment 2 Document in Chart   Glucose, capillary     Status: Abnormal   Collection Time: 09/01/15 12:28 AM  Result Value Ref Range   Glucose-Capillary 159 (H) 65 - 99 mg/dL  Glucose, capillary     Status: Abnormal   Collection Time: 09/01/15  4:44 AM  Result Value Ref Range   Glucose-Capillary 142 (H) 65 - 99 mg/dL  Comprehensive metabolic panel     Status: Abnormal   Collection Time: 09/01/15  5:00 AM  Result Value Ref Range   Sodium 151 (H)  135 - 145 mmol/L   Potassium 3.0 (L) 3.5 - 5.1 mmol/L   Chloride 115 (H) 101 - 111 mmol/L   CO2 28 22 - 32 mmol/L   Glucose, Bld 157 (H) 65 - 99 mg/dL   BUN 11 6 - 20 mg/dL   Creatinine, Ser 1.610.76 0.61 - 1.24 mg/dL   Calcium 7.5 (L) 8.9 - 10.3 mg/dL   Total Protein 5.8 (L) 6.5 - 8.1 g/dL   Albumin 2.0 (L) 3.5 - 5.0 g/dL   AST 096124 (H) 15 - 41 U/L   ALT 88 (H) 17 - 63 U/L   Alkaline Phosphatase 76 38 - 126 U/L   Total Bilirubin 7.6 (H) 0.3 - 1.2 mg/dL   GFR calc non Af Amer >60 >60 mL/min   GFR calc Af Amer >60 >60 mL/min   Anion gap 8 5 - 15    Studies/Results: No results found.    Assessment: 1. Status post variceal bleed 2. Hepatic encephalopathy 3. Alcoholic liver disease 4. Ascites and peripheral edema   Plan: 1. Continue tube feeds 2. Lactulose 3. Continue diuretics 4. Monitor for GI bleed 5. Paracentesis when necessary Demaris Leavell C 09/01/2015, 6:57 AM  Pager  [phone] If no answer or after 5 PM call [phone]

## 2015-09-01 NOTE — Consult Note (Signed)
Consultation Note Date: 09/01/2015   Patient Name: [name]  DOB: 07-14-1980  MRN: [id number]  Age / Sex: 35 y.o., male  PCP: No primary care provider on file. Referring Physician: Cristal Ford, DO  Reason for Consultation: Establishing goals of care  HPI/Patient Profile: 35 y.o. male  with past medical history of  ETOH use admitted on 08/20/2015 with  Hematemesis and hematochezia and 4-24. Patient had ongoing acute blood loss anemia with hemoglobin dropping to as well as 6.8 g/dL. Patient was seen by gastroenterology and underwent EGD. Subsequently patient suffered about a ventilator dependent respiratory failure. Hospital course also complicated by acute kidney injury that required aggressive volume resuscitation. Patient initially required Neo-Synephrine drip. Patient's hospital course complicated by ongoing significant delirium/encephalopathy.   Clinical Assessment and Goals of Care:  Patient is a 35 year old who was initially admitted with hematemesis and hematochezia on April 24. It is reported on chart review that the patient used to drink a fifth of water, every day. Patient was given IV fluid resuscitation, packed red blood cell transfusion, seen by gastroenterology. Patient suffered hemorrhagic shock. Initially required ventilator, extubated since 4-27. Patient has a history of cirrhosis, variceal bleed alcoholic cirrhosis with ascites transaminitis. Remains encephalopathic. Palliative care consultation for goals of care discussions  Patient appears severely icteric in appearance. Patient's parents both father and mother are present at the bedside. Described palliative care as follows.  Palliative medicine is specialized medical care for people living with serious illness. It focuses on providing relief from the symptoms and stress of a serious illness. The goal is to improve quality of life for  both the patient and the family.  Discussed frankly with both parents that while efforts are underway to continue with any and all reasonable critical care measures, patient does remain at high risk for significant decompensation and even death. Parents are tearful and states that they're aware of the patient's significant serious illnesses. He stated that they remain hopeful for some degree of stabilization/recovery. Offered supportive care and active listening. See recommendations below. Palliative will continue to follow along. Thank you for the consult.  HCPOA  both parents.   SUMMARY OF RECOMMENDATIONS    DNR Continue current mode of care Briefly introduced palliative care to parents, we will continue to follow patient's disease trajectory and help guide decision making.  Discussed briefly with parents at the bedside regarding comfort measures and how we can help, I will follow up again.   Code Status/Advance Care Planning:  DNR    Symptom Management:    continue current care.   Palliative Prophylaxis:   Delirium Protocol  Psycho-social/Spiritual:   Desire for further Chaplaincy support:no  Additional Recommendations: Caregiving  Support/Resources  Prognosis:   < 2 weeks  Discharge Planning: To Be Determined      Primary Diagnoses: Present on Admission:  . Acute blood loss anemia . Acute GI bleeding . ETOH abuse . Elevated INR . Hypoalbuminemia . Hypokalemia . Hypocalcemia . Hypomagnesemia  I have reviewed the medical record, interviewed the  patient and family, and examined the patient. The following aspects are pertinent.  Past Medical History  Diagnosis Date  . GERD (gastroesophageal reflux disease)   . ETOH abuse    Social History   Social History  . Marital Status: Single    Spouse Name: N/A  . Number of Children: N/A  . Years of Education: N/A   Social History Main Topics  . Smoking status: Heavy Tobacco Smoker -- 1.00 packs/day    Types:  Cigarettes  . Smokeless tobacco: Never Used  . Alcohol Use: Yes     Comment: 2 Four loco per day  . Drug Use: No  . Sexual Activity: Not Asked   Other Topics Concern  . None   Social History Narrative   Family History  Problem Relation Age of Onset  . Peptic Ulcer Paternal Aunt   . Crohn's disease Maternal Uncle   . Heart attack Maternal Uncle   . Heart attack Maternal Grandfather   . Stroke Maternal Grandmother   . Stroke Paternal Grandfather    Scheduled Meds: . antiseptic oral rinse  7 mL Mouth Rinse QID  . chlorhexidine gluconate (SAGE KIT)  15 mL Mouth Rinse BID  . clonazepam  0.25 mg Oral BID  . cloNIDine  0.1 mg Per Tube BID  . folic acid  1 mg Per Tube Daily  . free water  400 mL Per Tube Q4H  . furosemide  20 mg Per Tube Daily  . lactulose  30 g Per Tube TID  . pantoprazole sodium  40 mg Per Tube BID  . potassium chloride  40 mEq Per Tube Q8H  . propranolol  20 mg Per Tube TID  . rifaximin  550 mg Per Tube TID  . risperiDONE  1 mg Per Tube Daily  . risperiDONE  1 mg Per Tube QHS  . spironolactone  50 mg Per Tube Daily  . thiamine  100 mg Per Tube Daily  . Valproate Sodium  500 mg Per Tube BID   Continuous Infusions: . dextrose 10 mL/hr at 08/30/15 1034  . feeding supplement (VITAL 1.5 CAL) 1,000 mL (09/01/15 0827)   PRN Meds:.haloperidol lactate, lip balm, liver oil-zinc oxide Medications Prior to Admission:  Prior to Admission medications   Medication Sig Start Date End Date Taking? Authorizing Provider  omeprazole (PRILOSEC) 20 MG capsule Take 20 mg by mouth daily.   Yes Historical Provider, MD   Allergies  Allergen Reactions  . Soy Allergy Other (See Comments)    Swelling.    Review of Systems Non verbal  Physical Exam Extremely icteric in appearance,  Moans in response to parents holding his hands S1 S2 Abdomen distended Shallow clear breathing Tattoos bilateral UE  Vital Signs: BP 108/67 mmHg  Pulse 85  Temp(Src) 99 F (37.2 C) (Oral)   Resp 36  Ht 5' 7"  (1.702 m)  Wt 80.287 kg (177 lb)  BMI 27.72 kg/m2  SpO2 94% Pain Assessment: FLACC POSS *See Group Information*: 4-INTERVENTION REQUIRED,Unacceptable,Somnolent, mininal or no response to verbal and physical stimulation Pain Score: Asleep   SpO2: SpO2: 94 % O2 Device:SpO2: 94 % O2 Flow Rate: .O2 Flow Rate (L/min): 2 L/min  IO: Intake/output summary:  Intake/Output Summary (Last 24 hours) at 09/01/15 1335 Last data filed at 09/01/15 0600  Gross per 24 hour  Intake 872.42 ml  Output      0 ml  Net 872.42 ml    LBM: Last BM Date: 09/01/15 Baseline Weight: Weight: 58.968 kg (130 lb)  Most recent weight: Weight: 80.287 kg (177 lb)     Palliative Assessment/Data:     Time In: 9 Time Out: 10 Time Total: 60 min  Greater than 50%  of this time was spent counseling and coordinating care related to the above assessment and plan.  Signed by: Loistine Chance, MD  [phone]  Please contact Palliative Medicine Team phone at [phone] for questions and concerns.  For individual provider: See Shea Evans

## 2015-09-01 NOTE — Progress Notes (Signed)
PROGRESS NOTE    [name]  HAL:[id number] DOB: 27-Dec-1980 DOA: 08/20/2015 PCP: No primary care provider on file.  Outpatient Specialists:  Brief Narrative:  35 year old male admitted with hematemesis and hematochezia on 08/20/2015, found to have anemia secondary to esophageal varices. GI was consulted, EGD was conducted. However patient was intubated for this procedure and remained so. [facility] and had taken over care of the patient. Consents, patient has been extubated and is currently stable, [facility] assumed care of patient.  Assessment & Plan   Acute respiratory failure -Patient did require intubation with EGD. Successfully extubated on 427 -PCCM continuing to follow -Patient does have risk for aspiration, continue aspiration precautions  Hemorrhagic shock -Resolved, likely secondary to GI bleeding -Patient did have some residual hypotension, thought to be secondary to the sedation versus cirrhosis and adrenal insufficiency.  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 He did continue to drink vodka -Status post paracentesis- no evidence of SBP -Continues to have ascites, continue spironolactone and Lasix, propranolol, lactulose and rifaximin -[facility] consulted and appreciated, status post EGD on 425- found to have variceal still bleeding, injected but not able to band -Continue PPI  Acute blood loss anemia -Secondary to the above -Globin currently 7.4 appears to be holding stable over the last several days.  Thrombocytopenia -Likely secondary to the above, continue to monitor CBC -Avoid anticoagulation  Coagulopathy -Secondary to cirrhosis -Patient was given vitamin K  Hepatic encephalopathy/hyperammonemia -Patient appears to be agitated. Father at bedside who states he has been agitated for quite some time and  is not improved. -Ammonia today 41 -Continue lactulose, rifaximin  Volume overload -Secondary to cirrhosis -Continue diuretics -Will give additional dose of Lasix 20 mg IV today if blood pressure will allow -Monitor output  Adrenal insufficiency -No longer on steroids -Continue to monitor vitals closely  Alcohol withdrawal/seizure -Patient had one seizure during this hospitalization -Continue clonazepam, clonidine, valproate  Malnutrition -Continue tube feeds  Electrolyte abnormalities -Patient did have hypokalemia, hypomagnesemia, hypophosphatemia, hypernatremia -Sodium continues to be high, currently on D5W -Replacing potassium -Continue to monitor and replace as needed  Goals of care -Spoke with patient's father this morning, at bedside, discussed possible palliative care consult and may be hospice and comfort care measures. -Father is somewhat optimistic however understands that his son may not recover from this. -Patient currently DO NOT RESUSCITATE -Palliative care consulted and appreciated.  DVT Prophylaxis  SCDs  Code Status: DNR  Family Communication: Father at bedside  Disposition Plan: Admitted, continue to monitor in stepdown  Consultants Gastroenterology PCCM Palliative care   Procedures/Significant Events 4/25 intubated for EGD: see above findings 4/26: requiring neo gtt, Am labs showing AKI, NAG metabolic acidosis & Hyperglycemia.  4/27: AKI-->resolved; NAG metabolic acidosis-->d/t hyperchloremia and volume resuscitation efforts-->resolved; Residual Lactic acidosis-->resolved; Hyperchloremia --->resolved; HyperKalemia --->resolved -->bicarb gtt stopped. Hgb stable. Extubated 4/28 still w/ sig delirium. Still pressor dependent. LFTs increased. [place] pending. Changed PPI to BID and octreotide stopped.  5/1 [place] w/ evidence of cirrhosis. Still on precedex. Off pressors; but boarderline BP. para ordered by GI; Cipro started for continued SBP empiric  coverage. Still confused 5/2 still confused. Actually on higher doses of precedex in spite of haldol. Retention enemas ordered. Started on low dose depakote. FT placed. Family conf w/ father and mother. Made DNR but continue aggressive medical care.  5/3 FT re-positioned over-night. Starting VT lactulose, rifaximin, Risperdal, and low  dose clonazepam. precedex d/c'd. TF started.  5/4 off precedex. Still agitated. A little more hypertensive. Had Para 5/3. ~1 liter. No evidence of SBP. Adjusting depakote, and clonidine. Adding diuretics and propranolol. Family at bedside.  5/5: a little better MS wise. Backing off on clonazepam and risperidone. Ascites a little worse. Increasing TF to goal.   Antibiotics   Anti-infectives    Start     Dose/Rate Route Frequency Ordered Stop   08/28/15 1600  rifaximin (XIFAXAN) tablet 550 mg     550 mg Per Tube 3 times daily 08/28/15 1443     08/27/15 1200  rifaximin (XIFAXAN) tablet 400 mg  Status:  Discontinued     400 mg Oral Every 8 hours 08/27/15 1034 08/28/15 1449   08/27/15 1200  ciprofloxacin (CIPRO) IVPB 400 mg     400 mg 200 mL/hr over 60 Minutes Intravenous Every 12 hours 08/27/15 1111 08/31/15 1309   08/21/15 2000  cefTRIAXone (ROCEPHIN) 1 g in dextrose 5 % 50 mL IVPB  Status:  Discontinued     1 g 100 mL/hr over 30 Minutes Intravenous Every 24 hours 08/21/15 0044 08/21/15 0051   08/21/15 2000  cefTRIAXone (ROCEPHIN) 2 g in dextrose 5 % 50 mL IVPB  Status:  Discontinued     2 g 100 mL/hr over 30 Minutes Intravenous Every 24 hours 08/21/15 0052 08/27/15 0858   08/20/15 2200  cefTRIAXone (ROCEPHIN) 1 g in dextrose 5 % 50 mL IVPB     1 g 100 mL/hr over 30 Minutes Intravenous  Once 08/20/15 2152 08/20/15 2324      Subjective:   [name] seen and examined today.  Patient currently grunting.  Not responsive to questions.   Objective:   Filed Vitals:   09/01/15 0600 09/01/15 0800 09/01/15 1020 09/01/15 1024  BP: 115/83  108/67 108/67    Pulse:    85  Temp:  99 F (37.2 C)    TempSrc:  Oral    Resp: 36     Height:      Weight:      SpO2: 94%       Intake/Output Summary (Last 24 hours) at 09/01/15 1154 Last data filed at 09/01/15 0600  Gross per 24 hour  Intake 1151.08 ml  Output      0 ml  Net 1151.08 ml   Filed Weights   08/22/15 1019 08/29/15 1256 08/31/15 1008  Weight: 75.9 kg (167 lb 5.3 oz) 75.9 kg (167 lb 5.3 oz) 89.359 kg (197 lb)    Exam  General: Well developed, somnolent   HEENT: NCAT, Scleral icterus, mucous membranes moist.   Cardiovascular: S1 S2 auscultated, RRR, no murmurs appreciated  Respiratory: Diminished but clear, no wheezing or crackles  Abdomen: Distended, + bowel sounds, ascites  Extremities: warm dry without cyanosis clubbing. Anasarca/edema from waist to lower ext.  Neuro: Unable to assess, very somnolent. Moves extremities sporadically.   Skin: jaundice. Multiple tattoos.   Psych: Appears agitated. Cannot fully assess  Data Reviewed: I have personally reviewed following labs and imaging studies  CBC:  Recent Labs Lab 08/26/15 0745 08/27/15 0350 08/27/15 0548 08/28/15 0407 09/01/15 0730  WBC 12.0* 6.3  --  8.6 12.3*  NEUTROABS 7.1 3.8  --   --   --   HGB 7.2* 6.1* 6.2* 7.8* 7.4*  HCT 23.1* 19.7* 20.3* 25.0* 23.7*  MCV 82.8 84.9  --  84.7 82.6  PLT 247 146*  --  150 263*   Basic Metabolic Panel:  Recent Labs Lab 08/26/15 0745 08/27/15 0350 08/28/15 0407 08/29/15 0607 08/30/15 0450 08/31/15 0500 09/01/15 0500  NA 144 146* 144 142  --  147* 151*  K 3.9 4.3 4.4 4.0  --  2.9* 3.0*  CL 113* 114* 113* 111  --  113* 115*  CO2 _0 --  28 28  GLUCOSE 96 127* 143* 135*  --  146* 157*  BUN 8 7 <5* 6  --  7 11  CREATININE 0.75 0.81 0.73 0.69  --  0.64 0.76  CALCIUM 7.0* 7.2* 7.4* 7.5*  --  7.5* 7.5*  MG 1.6*  --  1.6* 1.9 1.7 2.0  --   PHOS 2.0* 2.6 2.3* 2.3* 1.4* 2.3*  --    GFR: Estimated Creatinine Clearance: 137.4 mL/min (by C-G formula  based on Cr of 0.76). Liver Function Tests:  Recent Labs Lab 08/26/15 0745 08/28/15 0407 08/29/15 0607 08/31/15 0500 09/01/15 0500  AST 512* 253* 198* 153* 124*  ALT 249* 154* 129* 107* 88*  ALKPHOS 58 49 51 66 76  BILITOT 7.7* 8.1* 6.3* 8.7* 7.6*  PROT 5.5* 5.6* 5.7* 6.2* 5.8*  ALBUMIN 2.2* 2.3* 2.2* 2.2* 2.0*   No results for input(s): LIPASE, AMYLASE in the last 168 hours.  Recent Labs Lab 08/28/15 0407 09/01/15 0800  AMMONIA 50* 41*   Coagulation Profile:  Recent Labs Lab 08/31/15 0500  INR 1.99*   Cardiac Enzymes: No results for input(s): CKTOTAL, CKMB, CKMBINDEX, TROPONINI in the last 168 hours. BNP (last 3 results) No results for input(s): PROBNP in the last 8760 hours. HbA1C: No results for input(s): HGBA1C in the last 72 hours. CBG:  Recent Labs Lab 08/31/15 1625 08/31/15 2018 09/01/15 0028 09/01/15 0444 09/01/15 0745  GLUCAP 167* 156* 159* 142* 153*   Lipid Profile: No results for input(s): CHOL, HDL, LDLCALC, TRIG, CHOLHDL, LDLDIRECT in the last 72 hours. Thyroid Function Tests: No results for input(s): TSH, T4TOTAL, FREET4, T3FREE, THYROIDAB in the last 72 hours. Anemia Panel: No results for input(s): VITAMINB12, FOLATE, FERRITIN, TIBC, IRON, RETICCTPCT in the last 72 hours. Urine analysis: No results found for: COLORURINE, APPEARANCEUR, LABSPEC, [place], GLUCOSEU, HGBUR, BILIRUBINUR, KETONESUR, PROTEINUR, UROBILINOGEN, NITRITE, LEUKOCYTESUR Sepsis Labs: _1 (procalcitonin:4,lacticidven:4)  ) Recent Results (from the past 240 hour(s))  Culture, body fluid-bottle     Status: None (Preliminary result)   Collection Time: 08/29/15 12:03 PM  Result Value Ref Range Status   Specimen Description FLUID ASCITIC  Final   Special Requests NONE  Final   Culture   Final    NO GROWTH 2 DAYS Performed at [facility]    Report Status PENDING  Incomplete  Gram stain     Status: None   Collection Time: 08/29/15 12:03 PM  Result Value  Ref Range Status   Specimen Description FLUID ASCITIC  Final   Special Requests NONE  Final   Gram Stain   Final    FEW WBC PRESENT,BOTH PMN AND MONONUCLEAR NO ORGANISMS SEEN Performed at [facility]    Report Status 08/29/2015 FINAL  Final      Radiology Studies: No results found.   Scheduled Meds: . antiseptic oral rinse  7 mL Mouth Rinse QID  . chlorhexidine gluconate (SAGE KIT)  15 mL Mouth Rinse BID  . clonazepam  0.25 mg Oral BID  . cloNIDine  0.1 mg Per Tube BID  . folic acid  1 mg Per Tube Daily  . free water  400 mL Per Tube Q4H  .  furosemide  20 mg Per Tube Daily  . lactulose  30 g Per Tube TID  . pantoprazole sodium  40 mg Per Tube BID  . potassium chloride  40 mEq Per Tube Q8H  . propranolol  20 mg Per Tube TID  . rifaximin  550 mg Per Tube TID  . risperiDONE  1 mg Per Tube Daily  . risperiDONE  1 mg Per Tube QHS  . spironolactone  50 mg Per Tube Daily  . thiamine  100 mg Per Tube Daily  . Valproate Sodium  500 mg Per Tube BID   Continuous Infusions: . dextrose 10 mL/hr at 08/30/15 1034  . feeding supplement (VITAL 1.5 CAL) 1,000 mL (09/01/15 0827)     LOS: 12 days   Time Spent in minutes   30 minutes  Kingsten Enfield D.O. on 09/01/2015 at 11:54 AM  Between 7am to 7pm - Pager - [phone]  After 7pm go to [email or web address] - password [email or web address]  And look for the night coverage person covering for me after hours  [facility] Office  [phone]

## 2015-09-02 DIAGNOSIS — Z7189 Other specified counseling: Secondary | ICD-10-CM

## 2015-09-02 LAB — CBC
HCT: 25.3 % — ABNORMAL LOW (ref 39.0–52.0)
Hemoglobin: 7.8 g/dL — ABNORMAL LOW (ref 13.0–17.0)
MCH: 25.3 pg — ABNORMAL LOW (ref 26.0–34.0)
MCHC: 30.8 g/dL (ref 30.0–36.0)
MCV: 82.1 fL (ref 78.0–100.0)
PLATELETS: 105 10*3/uL — AB (ref 150–400)
RBC: 3.08 MIL/uL — ABNORMAL LOW (ref 4.22–5.81)
RDW: 22.4 % — AB (ref 11.5–15.5)
WBC: 14 10*3/uL — AB (ref 4.0–10.5)

## 2015-09-02 LAB — BASIC METABOLIC PANEL
Anion gap: 6 (ref 5–15)
BUN: 14 mg/dL (ref 6–20)
CALCIUM: 7.5 mg/dL — AB (ref 8.9–10.3)
CO2: 29 mmol/L (ref 22–32)
CREATININE: 0.78 mg/dL (ref 0.61–1.24)
Chloride: 117 mmol/L — ABNORMAL HIGH (ref 101–111)
GFR calc Af Amer: >60 mL/min (ref >60)
Glucose, Bld: 150 mg/dL — ABNORMAL HIGH (ref 65–99)
POTASSIUM: 3.7 mmol/L (ref 3.5–5.1)
SODIUM: 152 mmol/L — AB (ref 135–145)

## 2015-09-02 LAB — GLUCOSE, CAPILLARY
GLUCOSE-CAPILLARY: 138 mg/dL — AB (ref 65–99)
GLUCOSE-CAPILLARY: 144 mg/dL — AB (ref 65–99)
Glucose-Capillary: 134 mg/dL — ABNORMAL HIGH (ref 65–99)
Glucose-Capillary: 144 mg/dL — ABNORMAL HIGH (ref 65–99)
Glucose-Capillary: 147 mg/dL — ABNORMAL HIGH (ref 65–99)
Glucose-Capillary: 128 mg/dL — ABNORMAL HIGH (ref 65–99)

## 2015-09-02 MED ORDER — RISPERIDONE 1 MG/ML PO SOLN
0.5000 mg | Freq: Every day | ORAL | Status: DC
  Administered 2015-09-02: 0.5 mg
  Filled 2015-09-02 (×2): qty 0.5

## 2015-09-02 MED ORDER — FENTANYL CITRATE (PF) 100 MCG/2ML IJ SOLN
25.0000 ug | INTRAMUSCULAR | Status: DC | PRN
  Administered 2015-09-02 – 2015-09-04 (×7): 25 ug via INTRAVENOUS
  Filled 2015-09-02 (×7): qty 2

## 2015-09-02 MED ORDER — VALPROATE SODIUM 250 MG/5ML PO SOLN
250.0000 mg | Freq: Two times a day (BID) | ORAL | Status: DC
  Administered 2015-09-02 – 2015-09-06 (×9): 250 mg
  Filled 2015-09-02 (×10): qty 5

## 2015-09-02 MED ORDER — FUROSEMIDE 10 MG/ML IJ SOLN
20.0000 mg | Freq: Once | INTRAMUSCULAR | Status: AC
  Administered 2015-09-02: 20 mg via INTRAVENOUS
  Filled 2015-09-02: qty 2

## 2015-09-02 NOTE — Progress Notes (Signed)
PULMONARY / CRITICAL CARE MEDICINE   Name: [name]: [id number] DOB: 1981/04/10    ADMISSION DATE:  08/20/2015 CONSULTATION DATE: 4/25  REFERRING MD:  [facility]  CHIEF COMPLAINT:  GIB  HISTORY OF PRESENT ILLNESS:   35 yo admitted after episode of hematemesis and hematochezia on 4/24 with continued blood loss till anemia was down to 6.8. He required electrolyte replacement for tetany with mg++, Ca++ and K+. PCCM called to place CVL but due to HD instability. On 4/25  he was intubated at GI request to facilitate EGD. Therefore PCCM will assume his care till stable. He drinks up to a fifth of vodka daily. Reports prior episode of bloody stools but did not seek medical attention. PCCM will assume his care for now.  SUBJECTIVE:  He is poorly responsive, will moan and sometimes speak phrases  VITAL SIGNS: BP 112/72 mmHg  Pulse 100  Temp(Src) 98.3 F (36.8 C) (Axillary)  Resp 17  Ht  (1.702 m)  Wt 80.287 kg (177 lb)  BMI 27.72 kg/m2  SpO2 100%  HEMODYNAMICS:    VENTILATOR SETTINGS:    INTAKE / OUTPUT: I/O last 3 completed shifts: In: 2525 [I.V.:60; Other:220; NG/GT:2245] Out: -   PHYSICAL EXAMINATION: General: moaning HENT: NCAT, OP clear PULM : normal effort, no wheeze or crackles CV: RRR, no mgr GI: distended, BS+, shifting dullness to percussion Derm: multiple tattoos, sacral erythema prob stage 1 without skin breakdown Neuro: very somnolent, not interacting or speaking, moves B UE  LABS:  BMET  Recent Labs Lab 08/31/15 0500 09/01/15 0500 09/02/15 0630  NA 147* 151* 152*  K 2.9* 3.0* 3.7  CL 113* 115* 117*  CO2 BUN CREATININE 0.64 0.76 0.78  GLUCOSE 146* 157* 150*    Electrolytes  Recent Labs Lab 08/29/15 0607 08/30/15 0450 08/31/15 0500 09/01/15 0500 09/02/15 0630  CALCIUM 7.5*  --  7.5* 7.5* 7.5*  MG 1.9 1.7 2.0  --   --   PHOS 2.3* 1.4* 2.3*  --   --     CBC  Recent Labs Lab 08/28/15 0407  09/01/15 0730 09/02/15 0630  WBC 8.6 12.3* 14.0*  HGB 7.8* 7.4* 7.8*  HCT 25.0* 23.7* 25.3*  PLT 150 115* 105*    Coag's  Recent Labs Lab 08/31/15 0500  INR 1.99*    Sepsis Markers  Recent Labs Lab 08/27/15 0350 08/28/15 0407 08/29/15 0607  PROCALCITON 0.74 0.35 0.18    ABG No results for input(s): PHART, PCO2ART, PO2ART in the last 168 hours.  Liver Enzymes  Recent Labs Lab 08/29/15 0607 08/31/15 0500 09/01/15 0500  AST 198* 153* 124*  ALT 129* 107* 88*  ALKPHOS 51 66 76  BILITOT 6.3* 8.7* 7.6*  ALBUMIN 2.2* 2.2* 2.0*    Cardiac Enzymes No results for input(s): TROPONINI, PROBNP in the last 168 hours.  Glucose  Recent Labs Lab 09/01/15 0745 09/01/15 1216 09/01/15 1632 09/01/15 2034 09/02/15 0121 09/02/15 0421  GLUCAP 153* 153* 153* 127* 138* 144*    Imaging No results found.   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 Stigmata of recent bleeding were evident and red wale signs (nipple sign) were present. Active bleeding began during attempted banding but wall appeared to be scarred and could not successfully place a band on this area. Banding was unsuccessful despite six attempts with incomplete  eradication of varices. Steady bleeding continued at the end of the procedure. These were successfully injected with 4 mL ethanolamine for hemostasis. 4/27 ECHO:Left ventricle: The cavity size was normal. Systolic function was normal. The estimated ejection fraction was in the range of 55% to 60%. Wall motion was normal; there were no regional wall motion abnormalities. The transmitral flow pattern was normal. The deceleration time of the early transmitral flow velocity was normal. The pulmonary vein flow pattern was normal. The tissue Doppler parameters were normal. Left ventricular diastolic function parameters were normal. 5/1: abd US +  cirrhosis  CULTURES: 4/24 BCx2>> (-)  ANTIBIOTICS: 4/24 ceftriaxone>>5/1 5/1 cipro>>> 5/5  SIGNIFICANT EVENTS: 4/25 intubated for EGD: see above findings 4/26: requiring neo gtt,  Am labs showing AKI, NAG metabolic acidosis & Hyperglycemia.  4/27: AKI-->resolved; NAG metabolic acidosis-->d/t hyperchloremia and volume resuscitation efforts-->resolved; Residual Lactic acidosis-->resolved; Hyperchloremia --->resolved; HyperKalemia --->resolved -->bicarb gtt stopped. Hgb stable. Extubated 4/28 still w/ sig delirium. Still pressor dependent. LFTs increased. US pending. Changed PPI to BID and octreotide stopped.  5/1 US w/ evidence of cirrhosis. Still on precedex. Off pressors; but boarderline BP. para ordered by GI; Cipro started for continued SBP empiric coverage. Still confused 5/2 still confused. Actually on higher doses of precedex in spite of haldol. Retention enemas ordered. Started on low dose depakote.  FT placed. Family conf w/ father and mother. Made DNR but continue aggressive medical care.  5/3 FT re-positioned over-night. Starting VT lactulose, rifaximin, Risperdal, and low dose clonazepam. precedex d/c'd. TF started.  5/4 off precedex. Still agitated. A little more hypertensive. Had Para 5/3.  ~1 liter. No evidence of SBP. Adjusting depakote, and clonidine. Adding diuretics and propranolol. Family at bedside.  5/5: a little better MS wise. Backing off on clonazepam and risperidone. Ascites a little worse. Increasing TF to goal.    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  Bleeding has stopped, now with slow to resolve encephalopathy. Favor ICU related delirium and hepatic encephalopathy at this point. This is a challenging situation as his delirium has not improved much until 5/4, and now he is hypersomnolent. We will continue lactulose, rifaximin, depakote and clonidine. Will  further adjust Risperdal 5/6, consider decrease clonazepam 5/7. Added free water for hypernatremia - will increase 5/6. Marland Kitchen.Added propranolol and diuretic management for routine ascites and variceal prophylaxis and seems to be tolerating this. He does have increased ascites by exam. We will look w/ US again. May end up needed repeat US paracentesis for resp mechanics.    ASSESSMENT / PLAN:  PULMONARY A: Respiratory failure resolved Aspiration risk P:   Monitor O2 saturation Mobilize  Asp precautions     CARDIOVASCULAR A:  Sinus tachycardia Hemorrhagic  Shock-->resolved residual hypotension> no clear cause, sedation vs cirrhosis related vs adrenal insuff-->resovled Volume overload  P:  Cont tele KVO IVFs Added spironolactone and lasix 5/4 Added low dose propranolol 5/4  RENAL A:   Hypokalemia and Hypomagnesemia; intermittent  Hypophophatemia Hypernatremia  P:   Free water replacement increased 5/6 KVO IVFs Replaced and recheck F/u am labs     GASTROINTESTINAL A:   Variceal bleed s/p EGD 4/25, injected but not able to band Alcoholic Cirrhosis w/ascites-->worse again 5/6 Transaminitis, uncertain etiology but improving Likely hepatic encephalopathy Protein cal malnutrition   P:   PPI BID  IF re-bleeds will need TIPS Lactulose scheduled Observe LFTs PRN titrate tubefeeds-->up to 55 cc/hr (goal)   HEMATOLOGIC  A:   Acute Blood loss anemia from GIB (resolved); Now hgb reflecting anemia of critical illness. Coagulopathy in setting of cirrhosis (likely) ->s/p one more unit PRBC on 4/26; also vit K; repeat x fusion 5/1 P:  Avoid anticoagulants PAS Transfuse to keep Hgb > 7 now that stable   INFECTIOUS A:   No overt infection, was on IV abx empirically to cover presumed varices (completed 7d rx) -->PCT, fever and WBC curve negative as of 5/2 -->s/p para 5/3. No evidence of SBP P:   cipro completed 5/1-5/5   ENDOCRINE A:   Adrenal insuff P:   ssi  Off  steroids   NEUROLOGIC A:   ETOH withdrawal , no hx of DT's but + sz x 1;  Encephalopahty prob 2/2 hepatic enceph >> appears to be more somnolent, probably over medicated slightly today. Will adjust down as below P: RASS goal: 0 Cont scheduled Lactulose and rifaximin Continue scheduled Risperdal, decrease am dose 5/7 Clonazepam 0.25 bid Low dose clonidine Adjusted up valproic acid in half on 5/7 Maximize day-time interaction and attempt to get day/night sleep/wake schedule back  FAMILY  - Updates: dad updated.  - Inter-disciplinary family meet or Palliative Care meeting: completed see note    Levy Pupa, MD, PhD 09/02/2015, 11:53 AM [facility] [phone] or if no answer [phone]

## 2015-09-02 NOTE — Progress Notes (Signed)
PROGRESS NOTE    SHAWNDELL SCHILLACI  IWO:[id number] DOB: 1980-08-03 DOA: 08/20/2015 PCP: No primary care provider on file.  Outpatient Specialists:  Brief Narrative:  35 year old male admitted with hematemesis and hematochezia on 08/20/2015, found to have anemia secondary to esophageal varices. GI was consulted, EGD was conducted. However patient was intubated for this procedure and remained so. [facility] and had taken over care of the patient. Consents, patient has been extubated and is currently stable, [facility] assumed care of patient.  Assessment & Plan   Acute respiratory failure -Patient did require intubation with EGD. Successfully extubated on 427 -PCCM continuing to follow -Patient does have risk for aspiration, continue aspiration precautions  Hemorrhagic shock -Resolved, likely secondary to GI bleeding -Patient did have some residual hypotension, thought to be secondary to the sedation versus cirrhosis and adrenal insufficiency.  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 He did continue to drink vodka -Status post paracentesis- no evidence of SBP -Continues to have ascites, continue spironolactone and Lasix, propranolol, lactulose and rifaximin -[facility] consulted and appreciated, status post EGD on 4/25- found to have variceal still bleeding, injected but not able to band -Continue PPI  Acute blood loss anemia -Secondary to the above -Hemoglobin currently 7.8, appears to be holding stable over the last several days.  Thrombocytopenia -Likely secondary to the above, continue to monitor CBC -Avoid anticoagulation  Coagulopathy -Secondary to cirrhosis -Patient was given vitamin K  Hepatic encephalopathy/hyperammonemia -Patient appears to be agitated. Father at bedside who states he has been agitated for quite some  time and is not improved. -Ammonia 41 -Continue lactulose, rifaximin -PCCM would like to change patient's medications and monitor mental status/agitation closely  Volume overload -Secondary to cirrhosis -Continue diuretics -Will give additional dose of Lasix 20 mg IV today if blood pressure will allow -Monitor output  Adrenal insufficiency -No longer on steroids -Continue to monitor vitals closely  Alcohol withdrawal/seizure -Patient had one seizure during this hospitalization -Continue clonazepam, valproate -Given that BP has been soft, and will give increase dose of lasix, will discontinue clonidine.  Pain -Continue fentanyl IV prn  Scrotal edema -Continue support and diuretics  Malnutrition -Continue tube feeds  Electrolyte abnormalities -Patient did have hypokalemia, hypomagnesemia, hypophosphatemia, hypernatremia -Sodium continues to be high, currently on D5W -Replacing potassium -Continue to monitor and replace as needed  Goals of care -Spoke with patient's father this morning, at bedside, discussed possible palliative care consult and may be hospice and comfort care measures. -Father is somewhat optimistic however understands that his son may not recover from this. -Patient currently DO NOT RESUSCITATE -Palliative care consulted and appreciated.  DVT Prophylaxis  SCDs  Code Status: DNR  Family Communication: Mother at bedside  Disposition Plan: Admitted, continue to monitor in stepdown. Patient may benefit from repeat paracentesis, PCCM will determine.  Consultants Gastroenterology PCCM Palliative care   Procedures/Significant Events 4/25 intubated for EGD: see above findings 4/26: requiring neo gtt, Am labs showing AKI, NAG metabolic acidosis & Hyperglycemia.  4/27: AKI-->resolved; NAG metabolic acidosis-->d/t hyperchloremia and volume resuscitation efforts-->resolved; Residual Lactic acidosis-->resolved; Hyperchloremia --->resolved; HyperKalemia  --->resolved -->bicarb gtt stopped. Hgb stable. Extubated 4/28 still w/ sig delirium. Still pressor dependent. LFTs increased. [place] pending. Changed PPI to BID and octreotide stopped.  5/1 [place] w/ evidence of cirrhosis. Still on precedex. Off pressors; but boarderline BP. para ordered by GI; Cipro started for continued SBP empiric coverage. Still confused  5/2 still confused. Actually on higher doses of precedex in spite of haldol. Retention enemas ordered. Started on low dose depakote. FT placed. Family conf w/ father and mother. Made DNR but continue aggressive medical care.  5/3 FT re-positioned over-night. Starting VT lactulose, rifaximin, Risperdal, and low dose clonazepam. precedex d/c'd. TF started.  5/4 off precedex. Still agitated. A little more hypertensive. Had Para 5/3. ~1 liter. No evidence of SBP. Adjusting depakote, and clonidine. Adding diuretics and propranolol. Family at bedside.  5/5: a little better MS wise. Backing off on clonazepam and risperidone. Ascites a little worse. Increasing TF to goal.   Antibiotics   Anti-infectives    Start     Dose/Rate Route Frequency Ordered Stop   08/28/15 1600  rifaximin (XIFAXAN) tablet 550 mg     550 mg Per Tube 3 times daily 08/28/15 1443     08/27/15 1200  rifaximin (XIFAXAN) tablet 400 mg  Status:  Discontinued     400 mg Oral Every 8 hours 08/27/15 1034 08/28/15 1449   08/27/15 1200  ciprofloxacin (CIPRO) IVPB 400 mg     400 mg 200 mL/hr over 60 Minutes Intravenous Every 12 hours 08/27/15 1111 08/31/15 1309   08/21/15 2000  cefTRIAXone (ROCEPHIN) 1 g in dextrose 5 % 50 mL IVPB  Status:  Discontinued     1 g 100 mL/hr over 30 Minutes Intravenous Every 24 hours 08/21/15 0044 08/21/15 0051   08/21/15 2000  cefTRIAXone (ROCEPHIN) 2 g in dextrose 5 % 50 mL IVPB  Status:  Discontinued     2 g 100 mL/hr over 30 Minutes Intravenous Every 24 hours 08/21/15 0052 08/27/15 0858   08/20/15 2200  cefTRIAXone (ROCEPHIN) 1 g in dextrose 5 % 50 mL  IVPB     1 g 100 mL/hr over 30 Minutes Intravenous  Once 08/20/15 2152 08/20/15 2324      Subjective:   [name] seen and examined today.  Patient currently grunting.  Not responsive to questions. Per mother at bedside, she feels patient may be in pain.    Objective:   Filed Vitals:   09/02/15 0300 09/02/15 0400 09/02/15 0500 09/02/15 0600  BP:  128/79  105/80  Pulse:      Temp:  98.3 F (36.8 C)    TempSrc:  Axillary    Resp: _0 Height:      Weight:      SpO2: 99% 98% 95% 100%    Intake/Output Summary (Last 24 hours) at 09/02/15 0955 Last data filed at 09/02/15 0600  Gross per 24 hour  Intake   1915 ml  Output      0 ml  Net   1915 ml   Filed Weights   08/29/15 1256 08/31/15 1008 09/01/15 1130  Weight: 75.9 kg (167 lb 5.3 oz) 89.359 kg (197 lb) 80.287 kg (177 lb)    Exam  General: Well developed, somnolent   HEENT: NCAT, Scleral icterus, mucous membranes moist.   Cardiovascular: S1 S2 auscultated, RRR, no murmurs appreciated  Respiratory: Diminished but clear, anteriorly. No wheezing or crackles  Abdomen: Distended, + bowel sounds, ascites  Extremities: warm dry without cyanosis clubbing. Anasarca/edema from waist to lower ext, mildy improved.  Neuro: Unable to assess, very somnolent. Moves extremities sporadically.   Skin: jaundice. Multiple tattoos.   Psych: Appears agitated. Cannot fully assess  Data Reviewed: I have personally reviewed following labs and imaging studies  CBC:  Recent Labs Lab 08/27/15 0350 08/27/15 0548 08/28/15  0407 09/01/15 0730 09/02/15 0630  WBC 6.3  --  8.6 12.3* 14.0*  NEUTROABS 3.8  --   --   --   --   HGB 6.1* 6.2* 7.8* 7.4* 7.8*  HCT 19.7* 20.3* 25.0* 23.7* 25.3*  MCV 84.9  --  84.7 82.6 82.1  PLT 146*  --  150 115* 035*   Basic Metabolic Panel:  Recent Labs Lab 08/27/15 0350 08/28/15 0407 08/29/15 0607 08/30/15 0450 08/31/15 0500 09/01/15 0500 09/02/15 0630  NA 146* 144 142  --  147*  151* 152*  K 4.3 4.4 4.0  --  2.9* 3.0* 3.7  CL 114* 113* 111  --  113* 115* 117*  CO2 _0 --  _1 GLUCOSE 127* 143* 135*  --  146* 157* 150*  BUN 7 <5* 6  --  _2 CREATININE 0.81 0.73 0.69  --  0.64 0.76 0.78  CALCIUM 7.2* 7.4* 7.5*  --  7.5* 7.5* 7.5*  MG  --  1.6* 1.9 1.7 2.0  --   --   PHOS 2.6 2.3* 2.3* 1.4* 2.3*  --   --    GFR: Estimated Creatinine Clearance: 130.9 mL/min (by C-G formula based on Cr of 0.78). Liver Function Tests:  Recent Labs Lab 08/28/15 0407 08/29/15 0607 08/31/15 0500 09/01/15 0500  AST 253* 198* 153* 124*  ALT 154* 129* 107* 88*  ALKPHOS 49 51 66 76  BILITOT 8.1* 6.3* 8.7* 7.6*  PROT 5.6* 5.7* 6.2* 5.8*  ALBUMIN 2.3* 2.2* 2.2* 2.0*   No results for input(s): LIPASE, AMYLASE in the last 168 hours.  Recent Labs Lab 08/28/15 0407 09/01/15 0800  AMMONIA 50* 41*   Coagulation Profile:  Recent Labs Lab 08/31/15 0500  INR 1.99*   Cardiac Enzymes: No results for input(s): CKTOTAL, CKMB, CKMBINDEX, TROPONINI in the last 168 hours. BNP (last 3 results) No results for input(s): PROBNP in the last 8760 hours. HbA1C: No results for input(s): HGBA1C in the last 72 hours. CBG:  Recent Labs Lab 09/01/15 1216 09/01/15 1632 09/01/15 2034 09/02/15 0121 09/02/15 0421  GLUCAP 153* 153* 127* 138* 144*   Lipid Profile: No results for input(s): CHOL, HDL, LDLCALC, TRIG, CHOLHDL, LDLDIRECT in the last 72 hours. Thyroid Function Tests: No results for input(s): TSH, T4TOTAL, FREET4, T3FREE, THYROIDAB in the last 72 hours. Anemia Panel: No results for input(s): VITAMINB12, FOLATE, FERRITIN, TIBC, IRON, RETICCTPCT in the last 72 hours. Urine analysis: No results found for: COLORURINE, APPEARANCEUR, [place], [place], [place], [place], BILIRUBINUR, KETONESUR, PROTEINUR, UROBILINOGEN, NITRITE, LEUKOCYTESUR Sepsis Labs: _3 (procalcitonin:4,lacticidven:4)  ) Recent Results (from the past 240 hour(s))  Culture, body  fluid-bottle     Status: None (Preliminary result)   Collection Time: 08/29/15 12:03 PM  Result Value Ref Range Status   Specimen Description FLUID ASCITIC  Final   Special Requests NONE  Final   Culture   Final    NO GROWTH 3 DAYS Performed at [facility]    Report Status PENDING  Incomplete  Gram stain     Status: None   Collection Time: 08/29/15 12:03 PM  Result Value Ref Range Status   Specimen Description FLUID ASCITIC  Final   Special Requests NONE  Final   Gram Stain   Final    FEW WBC PRESENT,BOTH PMN AND MONONUCLEAR NO ORGANISMS SEEN Performed at [facility]    Report Status 08/29/2015 FINAL  Final      Radiology Studies: No results found.  Scheduled Meds: . antiseptic oral rinse  7 mL Mouth Rinse QID  . chlorhexidine gluconate (SAGE KIT)  15 mL Mouth Rinse BID  . clonazepam  0.25 mg Oral BID  . cloNIDine  0.1 mg Per Tube BID  . folic acid  1 mg Per Tube Daily  . free water  400 mL Per Tube Q4H  . furosemide  20 mg Per Tube Daily  . lactulose  30 g Per Tube TID  . pantoprazole sodium  40 mg Per Tube BID  . potassium chloride  40 mEq Per Tube Q8H  . propranolol  20 mg Per Tube TID  . rifaximin  550 mg Per Tube TID  . risperiDONE  0.5 mg Per Tube Daily  . risperiDONE  1 mg Per Tube QHS  . spironolactone  50 mg Per Tube Daily  . thiamine  100 mg Per Tube Daily  . Valproate Sodium  250 mg Per Tube BID   Continuous Infusions: . dextrose 10 mL/hr at 08/30/15 1034  . feeding supplement (VITAL 1.5 CAL) 1,000 mL (09/02/15 0910)     LOS: 13 days   Time Spent in minutes   30 minutes  ,  D.O. on 09/02/2015 at 9:55 AM  Between 7am to 7pm - Pager - [phone]  After 7pm go to [email or web address] - password [email or web address]  And look for the night coverage person covering for me after hours  [facility] Office  [phone]

## 2015-09-02 NOTE — Progress Notes (Signed)
Daily Progress Note   Patient Name: [name]       Date: 09/02/2015 DOB: [birth year]  Age: 35 y.o. MRN#: [id number] Attending Physician: Cristal Ford, DO Primary Care Physician: No primary care provider on file. Admit Date: 08/20/2015  Reason for Consultation/Follow-up: Establishing goals of care  Subjective:  moans in bed,   Length of Stay: 13  Current Medications: Scheduled Meds:  . antiseptic oral rinse  7 mL Mouth Rinse QID  . chlorhexidine gluconate (SAGE KIT)  15 mL Mouth Rinse BID  . clonazepam  0.25 mg Oral BID  . folic acid  1 mg Per Tube Daily  . free water  400 mL Per Tube Q4H  . furosemide  20 mg Per Tube Daily  . lactulose  30 g Per Tube TID  . pantoprazole sodium  40 mg Per Tube BID  . potassium chloride  40 mEq Per Tube Q8H  . propranolol  20 mg Per Tube TID  . rifaximin  550 mg Per Tube TID  . risperiDONE  0.5 mg Per Tube Daily  . risperiDONE  1 mg Per Tube QHS  . spironolactone  50 mg Per Tube Daily  . thiamine  100 mg Per Tube Daily  . Valproate Sodium  250 mg Per Tube BID    Continuous Infusions: . dextrose 10 mL/hr at 08/30/15 1034  . feeding supplement (VITAL 1.5 CAL) 1,000 mL (09/02/15 0910)    PRN Meds: fentaNYL (SUBLIMAZE) injection, haloperidol lactate, lip balm, liver oil-zinc oxide  Physical Exam         Weak young gentleman In mild to moderate distress S1 S2 Abdomen distended Icteric appearance  Vital Signs: BP 112/72 mmHg  Pulse 100  Temp(Src) 98.3 F (36.8 C) (Axillary)  Resp 17  Ht 5' 7"  (1.702 m)  Wt 80.287 kg (177 lb)  BMI 27.72 kg/m2  SpO2 100% SpO2: SpO2: 100 % O2 Device: O2 Device: Not Delivered O2 Flow Rate: O2 Flow Rate (L/min): 2 L/min  Intake/output summary:  Intake/Output Summary (Last 24 hours) at  09/02/15 1158 Last data filed at 09/02/15 0600  Gross per 24 hour  Intake   1915 ml  Output      0 ml  Net   1915 ml   LBM: Last BM Date: 09/01/15 Baseline Weight: Weight: 58.968 kg (130  lb) Most recent weight: Weight: 80.287 kg (177 lb)       Palliative Assessment/Data:    Flowsheet Rows        Most Recent Value   Intake Tab    Clinical Assessment    Palliative Performance Scale Score  10%   Psychosocial & Spiritual Assessment    Palliative Care Outcomes       Patient Active Problem List   Diagnosis Date Noted  . Acute upper GI bleed   . Ascites   . Distended abdomen   . Hemorrhagic shock   . Encounter for palliative care   . Goals of care, counseling/discussion   . Encephalopathy acute 08/27/2015  . Alcoholic cirrhosis of liver with ascites ([place])   . Acute hypoxemic respiratory failure ([place])   . SBP (spontaneous bacterial peritonitis) ([place])   . Hypocalcemia 08/21/2015  . Hypomagnesemia 08/21/2015  . Acute blood loss anemia 08/20/2015  . Acute GI bleeding 08/20/2015  . ETOH abuse 08/20/2015  . Elevated INR 08/20/2015  . Hypoalbuminemia 08/20/2015  . Hypokalemia 08/20/2015    Palliative Care Assessment & Plan   Patient Profile:    Assessment:  multi factorial encephalopathy ETOH cirrhosis liver with ascites S/p VDRF Recent GIB  Recommendations/Plan:   efforts are underway to continue to adjust medications to improve delirium, as discussed with Dr Ree Kida, family wondering if patient has uncontrolled pain, will add low dose Fentanyl IV PRN and monitor.     Code Status:    Code Status Orders        Start     Ordered   08/28/15 1529  Do not attempt resuscitation (DNR)   Continuous    Question:  Maintain current active treatments  Answer:  Yes   08/28/15 1528    Code Status History    Date Active Date Inactive Code Status Order ID Comments User Context   08/21/2015 12:44 AM 08/28/2015  3:28 PM Full Code [id number]  Edwin Dada, MD  Inpatient       Prognosis:   Unable to determine but appears guarded given serious illness of his liver disease.   Discharge Planning:  To Be Determined  Care plan was discussed with  Dr Lamonte Sakai and Dr Ree Kida.   Thank you for allowing the Palliative Medicine Team to assist in the care of this patient.   Time In:  9 Time Out: 925 Total Time 25 Prolonged Time Billed  no       Greater than 50%  of this time was spent counseling and coordinating care related to the above assessment and plan.  Loistine Chance, MD [phone] Please contact Palliative Medicine Team phone at [phone] for questions and concerns.

## 2015-09-03 ENCOUNTER — Inpatient Hospital Stay (HOSPITAL_COMMUNITY): Payer: Medicaid Other

## 2015-09-03 DIAGNOSIS — G9340 Encephalopathy, unspecified: Secondary | ICD-10-CM | POA: Insufficient documentation

## 2015-09-03 LAB — CBC
HEMATOCRIT: 25.7 % — AB (ref 39.0–52.0)
HEMOGLOBIN: 7.9 g/dL — AB (ref 13.0–17.0)
MCH: 25.7 pg — AB (ref 26.0–34.0)
MCHC: 30.7 g/dL (ref 30.0–36.0)
MCV: 83.7 fL (ref 78.0–100.0)
Platelets: 116 10*3/uL — ABNORMAL LOW (ref 150–400)
RBC: 3.07 MIL/uL — ABNORMAL LOW (ref 4.22–5.81)
RDW: 23.5 % — AB (ref 11.5–15.5)
WBC: 14.3 10*3/uL — ABNORMAL HIGH (ref 4.0–10.5)

## 2015-09-03 LAB — COMPREHENSIVE METABOLIC PANEL
ALK PHOS: 83 U/L (ref 38–126)
ALT: 67 U/L — ABNORMAL HIGH (ref 17–63)
ANION GAP: 7 (ref 5–15)
AST: 92 U/L — ABNORMAL HIGH (ref 15–41)
Albumin: 2 g/dL — ABNORMAL LOW (ref 3.5–5.0)
BILIRUBIN TOTAL: 6.3 mg/dL — AB (ref 0.3–1.2)
BUN: 19 mg/dL (ref 6–20)
CALCIUM: 7.6 mg/dL — AB (ref 8.9–10.3)
CO2: 29 mmol/L (ref 22–32)
Chloride: 117 mmol/L — ABNORMAL HIGH (ref 101–111)
Creatinine, Ser: 0.83 mg/dL (ref 0.61–1.24)
Glucose, Bld: 142 mg/dL — ABNORMAL HIGH (ref 65–99)
POTASSIUM: 4.3 mmol/L (ref 3.5–5.1)
Sodium: 153 mmol/L — ABNORMAL HIGH (ref 135–145)
TOTAL PROTEIN: 6.5 g/dL (ref 6.5–8.1)

## 2015-09-03 LAB — GLUCOSE, CAPILLARY
GLUCOSE-CAPILLARY: 110 mg/dL — AB (ref 65–99)
GLUCOSE-CAPILLARY: 130 mg/dL — AB (ref 65–99)
GLUCOSE-CAPILLARY: 131 mg/dL — AB (ref 65–99)
GLUCOSE-CAPILLARY: 135 mg/dL — AB (ref 65–99)
GLUCOSE-CAPILLARY: 140 mg/dL — AB (ref 65–99)
Glucose-Capillary: 107 mg/dL — ABNORMAL HIGH (ref 65–99)

## 2015-09-03 LAB — CULTURE, BODY FLUID W GRAM STAIN -BOTTLE: Culture: NO GROWTH

## 2015-09-03 LAB — PROTIME-INR
INR: 1.56 — AB (ref 0.00–1.49)
PROTHROMBIN TIME: 18.2 s — AB (ref 11.6–15.2)

## 2015-09-03 LAB — AMMONIA: AMMONIA: 55 umol/L — AB (ref 9–35)

## 2015-09-03 LAB — CULTURE, BODY FLUID-BOTTLE

## 2015-09-03 MED ORDER — BENZOCAINE 20 % MT AERO
INHALATION_SPRAY | Freq: Once | OROMUCOSAL | Status: AC
  Administered 2015-09-03: 12:00:00 via OROMUCOSAL
  Filled 2015-09-03: qty 57

## 2015-09-03 MED ORDER — CHLORHEXIDINE GLUCONATE 0.12 % MT SOLN
OROMUCOSAL | Status: AC
  Filled 2015-09-03: qty 15

## 2015-09-03 NOTE — Progress Notes (Signed)
Date:  Sep 03, 2015 Chart reviewed for concurrent status and case management needs. Will continue to follow patient for changes and needs: remains confused, wbc elevated, hypotensive. Marcelle Smilinghonda Cherika Jessie, BSN, [place]3, [place]   [phone]

## 2015-09-03 NOTE — Progress Notes (Signed)
Date:  Sep 03, 2015 Chart reviewed for concurrent status and case management needs. Will continue to follow patient for changes and needs: remains confused off iv precedex Marcelle Smilinghonda Davis, BSN, [place]3, [place]CCM   [phone]

## 2015-09-03 NOTE — Progress Notes (Addendum)
PROGRESS NOTE  [name] YVO:[id number] DOB: Oct 16, 1980 DOA: 08/20/2015 PCP: No primary care provider on file.  HPI/Recap of past 24 hours: Patient is a 35 year old male with past mental history of alcohol abuse admitted on 4/24 with hematemesis and hematochezia and was found to have anemia secondary to esophageal varices. Endoscopy was conducted by GI which required patient to be intubated for this procedure and remained that way due to hemorrhagic shock from acute blood loss anemia. At that point, critical care took over for patient. Once patient able to be extubated, hospitalists assumed care.  Since that time, patient slowly progressing. Has required 2 paracenteses and treating other sequela of alcoholic cirrhosis. In the last few days, has become slightly more responsive although still delirious. Receiving TPN.  Today, patient will slurred speech, but only complains of discomfort in his buttock area due to heavy use of lactulose. No breathing issues. Lab work otherwise stable.  Assessment/Plan: Principal Problem:   Acute blood loss anemia causing hemorrhagic shock: Shock resolved. Patient with some residual hypotension from possible cirrhosis versus continued use of lactulose. 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 Status post paracentesis with no evidence of SBP. On Lasix, propranolol, lactulose and rifaximin. EGD done 4/25 noted still bleeding varices, injected but not able to band. Continue PPI.  Patient given vitamin K for coagulopathy.    Electrolyte abnormalities including hypomagnesemia, hypokalemia, hypocalcemia, hypernatremia and hypophosphatemia: Currently on D5W. Replacing potassium and other left lites as needed. Hypoalbuminemia    Acute hypoxemic respiratory failure [facility]): Requiring intubation. Able to be extubated on 4/27.  Continued risk for aspiration so monitoring closely.     Encounter for palliative care   Goals of care, counseling/discussion Adrenal insufficiency: No longer on steroids, continue to monitor closely  Code Status: DO NOT RESUSCITATE, see below for goals of care   Family Communication: Net with parents at the bedside   Disposition Plan: Slowly improving. Would like to see blood pressure staying above 90 and then transfer to floor. Palliative care involved, patient DO NOT RESUSCITATE. Awaiting to see about whether he will improve for plans for long-term disposition.    Consultants:  Gastroenterology  Critical care  Palliative care   Procedures:  4/25-4/27: Intubation   4/25: EGD: Grade 2 esophageal varices, incompletely eradicated. Injected  5/5 Ultrasound guided paracentesis: 1 L of fluid removed  5/3: Ultrasound-guided paracentesis: Approximately 1 L of fluid removed  Antimicrobials:  IV Cipro 5/1-5/5  IV Rocephin 4/25-5/1   DVT prophylaxis:  SCDs given coagulopathy Objective: Filed Vitals:   09/03/15 0400 09/03/15 0500 09/03/15 0600 09/03/15 0800  BP: 118/66  98/69 86/61  Pulse:      Temp: 98.5 F (36.9 C)   98.5 F (36.9 C)  TempSrc: Axillary   Axillary  Resp: _0 Height:      Weight:  180 kg (396 lb 13.3 oz)    SpO2: 97%  100% 100%    Intake/Output Summary (Last 24 hours) at 09/03/15 1049 Last data filed at 09/03/15 0800  Gross per 24 hour  Intake   3770 ml  Output      0 ml  Net   3770 ml   Filed Weights   08/31/15 1008 09/01/15 1130 09/03/15 0500  Weight: 89.359 kg (197 lb) 80.287 kg (177 lb) 180 kg (396 lb 13.3 oz)    Exam:   General:  Alert and oriented 2, no acute distress  Cardiovascular: Regular rate and rhythm, S1-S2  Respiratory: Clear to auscultation bilaterally  Abdomen: Soft, distended, nontender, hypoactive bowel sounds    Musculoskeletal: 1+ pitting edema    Skin: Questionable jaundice   Psychiatry: still  somewhat confused, although reportedly this is improved     Data Reviewed: CBC:  Recent Labs Lab 08/28/15 0407 09/01/15 0730 09/02/15 0630 09/03/15 0808  WBC 8.6 12.3* 14.0* 14.3*  HGB 7.8* 7.4* 7.8* 7.9*  HCT 25.0* 23.7* 25.3* 25.7*  MCV 84.7 82.6 82.1 83.7  PLT 150 115* 105* 122*   Basic Metabolic Panel:  Recent Labs Lab 08/28/15 0407 08/29/15 0607 08/30/15 0450 08/31/15 0500 09/01/15 0500 09/02/15 0630 09/03/15 0808  NA 144 142  --  147* 151* 152* 153*  K 4.4 4.0  --  2.9* 3.0* 3.7 4.3  CL 113* 111  --  113* 115* 117* 117*  CO2 23 25  --  _0 GLUCOSE 143* 135*  --  146* 157* 150* 142*  BUN <5* 6  --  _1 CREATININE 0.73 0.69  --  0.64 0.76 0.78 0.83  CALCIUM 7.4* 7.5*  --  7.5* 7.5* 7.5* 7.6*  MG 1.6* 1.9 1.7 2.0  --   --   --   PHOS 2.3* 2.3* 1.4* 2.3*  --   --   --    GFR: Estimated Creatinine Clearance: 196.3 mL/min (by C-G formula based on Cr of 0.83). Liver Function Tests:  Recent Labs Lab 08/28/15 0407 08/29/15 0607 08/31/15 0500 09/01/15 0500 09/03/15 0808  AST 253* 198* 153* 124* 92*  ALT 154* 129* 107* 88* 67*  ALKPHOS 49 51 66 76 83  BILITOT 8.1* 6.3* 8.7* 7.6* 6.3*  PROT 5.6* 5.7* 6.2* 5.8* 6.5  ALBUMIN 2.3* 2.2* 2.2* 2.0* 2.0*   No results for input(s): LIPASE, AMYLASE in the last 168 hours.  Recent Labs Lab 08/28/15 0407 09/01/15 0800 09/03/15 0457  AMMONIA 50* 41* 55*   Coagulation Profile:  Recent Labs Lab 08/31/15 0500 09/03/15 0808  INR 1.99* 1.56*   Cardiac Enzymes: No results for input(s): CKTOTAL, CKMB, CKMBINDEX, TROPONINI in the last 168 hours. BNP (last 3 results) No results for input(s): PROBNP in the last 8760 hours. HbA1C: No results for input(s): HGBA1C in the last 72 hours. CBG:  Recent Labs Lab 09/02/15 1557 09/02/15 2031 09/03/15 0050 09/03/15 0500 09/03/15 0722  GLUCAP 147* 128* 135* 140* 130*   Lipid Profile: No results for input(s): CHOL, HDL, LDLCALC, TRIG, CHOLHDL,  LDLDIRECT in the last 72 hours. Thyroid Function Tests: No results for input(s): TSH, T4TOTAL, FREET4, T3FREE, THYROIDAB in the last 72 hours. Anemia Panel: No results for input(s): VITAMINB12, FOLATE, FERRITIN, TIBC, IRON, RETICCTPCT in the last 72 hours. Urine analysis: No results found for: COLORURINE, APPEARANCEUR, LABSPEC, PHURINE, GLUCOSEU, HGBUR, BILIRUBINUR, KETONESUR, PROTEINUR, UROBILINOGEN, NITRITE, LEUKOCYTESUR Sepsis Labs: _2 (procalcitonin:4,lacticidven:4)  ) Recent Results (from the past 240 hour(s))  Culture, body fluid-bottle     Status: None (Preliminary result)   Collection Time: 08/29/15 12:03 PM  Result Value Ref Range Status   Specimen Description FLUID ASCITIC  Final   Special Requests NONE  Final   Culture   Final    NO GROWTH 4 DAYS Performed at [facility]    Report Status PENDING  Incomplete  Gram stain     Status: None   Collection Time: 08/29/15 12:03 PM  Result Value Ref Range Status   Specimen Description FLUID ASCITIC  Final  Special Requests NONE  Final   Gram Stain   Final    FEW WBC PRESENT,BOTH PMN AND MONONUCLEAR NO ORGANISMS SEEN Performed at [facility]    Report Status 08/29/2015 FINAL  Final      Studies: No results found.  Scheduled Meds: . antiseptic oral rinse  7 mL Mouth Rinse QID  . chlorhexidine gluconate (SAGE KIT)  15 mL Mouth Rinse BID  . folic acid  1 mg Per Tube Daily  . free water  400 mL Per Tube Q4H  . furosemide  20 mg Per Tube Daily  . lactulose  30 g Per Tube TID  . pantoprazole sodium  40 mg Per Tube BID  . potassium chloride  40 mEq Per Tube Q8H  . propranolol  20 mg Per Tube TID  . rifaximin  550 mg Per Tube TID  . risperiDONE  1 mg Per Tube QHS  . spironolactone  50 mg Per Tube Daily  . thiamine  100 mg Per Tube Daily  . Valproate Sodium  250 mg Per Tube BID    Continuous Infusions: . dextrose 10 mL/hr at 08/30/15 1034  . feeding supplement (VITAL 1.5 CAL) 1,000 mL (09/03/15  0002)     LOS: 14 days   Time spent: 25 minutes   , K, MD [facility] Pager [phone]   If 7PM-7AM, please contact night-coverage [email or web address] Password [email or web address] 09/03/2015, 10:49 AM    

## 2015-09-03 NOTE — Progress Notes (Signed)
[facility] Progress Note  Subjective: Patient is lethargic. Family present and they state that he recognizes them when he wakes up.  Objective: Vital signs in last 24 hours: Temp:  [98.2 F (36.8 C)-98.5 F (36.9 C)] 98.5 F (36.9 C) (05/08 0800) Pulse Rate:  [88-100] 91 (05/07 2328) Resp:  [14-26] 14 (05/08 0800) BP: (86-126)/(59-92) 86/61 mmHg (05/08 0800) SpO2:  [95 %-100 %] 100 % (05/08 0800) Weight:  [180 kg (396 lb 13.3 oz)] 180 kg (396 lb 13.3 oz) (05/08 0500) Weight change: 99.713 kg (219 lb 13.3 oz)   PE:  No distress  Heart regular rhythm  Lungs clear  Abdomen nontender but ascites present  Lab Results: Results for orders placed or performed during the hospital encounter of 08/20/15 (from the past 24 hour(s))  Glucose, capillary     Status: Abnormal   Collection Time: 09/02/15 11:59 AM  Result Value Ref Range   Glucose-Capillary 144 (H) 65 - 99 mg/dL   Comment 1 Notify RN    Comment 2 Document in Chart   Glucose, capillary     Status: Abnormal   Collection Time: 09/02/15  3:57 PM  Result Value Ref Range   Glucose-Capillary 147 (H) 65 - 99 mg/dL  Glucose, capillary     Status: Abnormal   Collection Time: 09/02/15  8:31 PM  Result Value Ref Range   Glucose-Capillary 128 (H) 65 - 99 mg/dL  Glucose, capillary     Status: Abnormal   Collection Time: 09/03/15 12:50 AM  Result Value Ref Range   Glucose-Capillary 135 (H) 65 - 99 mg/dL  Ammonia     Status: Abnormal   Collection Time: 09/03/15  4:57 AM  Result Value Ref Range   Ammonia 55 (H) 9 - 35 umol/L  Glucose, capillary     Status: Abnormal   Collection Time: 09/03/15  5:00 AM  Result Value Ref Range   Glucose-Capillary 140 (H) 65 - 99 mg/dL  Glucose, capillary     Status: Abnormal   Collection Time: 09/03/15  7:22 AM  Result Value Ref Range   Glucose-Capillary 130 (H) 65 - 99 mg/dL   Comment 1 Notify RN    Comment 2 Document in Chart   CBC     Status: Abnormal   Collection Time:  09/03/15  8:08 AM  Result Value Ref Range   WBC 14.3 (H) 4.0 - 10.5 K/uL   RBC 3.07 (L) 4.22 - 5.81 MIL/uL   Hemoglobin 7.9 (L) 13.0 - 17.0 g/dL   HCT 40.925.7 (L) 81.139.0 - 91.452.0 %   MCV 83.7 78.0 - 100.0 fL   MCH 25.7 (L) 26.0 - 34.0 pg   MCHC 30.7 30.0 - 36.0 g/dL   RDW 78.223.5 (H) 95.611.5 - 21.315.5 %   Platelets 116 (L) 150 - 400 K/uL  Comprehensive metabolic panel     Status: Abnormal   Collection Time: 09/03/15  8:08 AM  Result Value Ref Range   Sodium 153 (H) 135 - 145 mmol/L   Potassium 4.3 3.5 - 5.1 mmol/L   Chloride 117 (H) 101 - 111 mmol/L   CO2 29 22 - 32 mmol/L   Glucose, Bld 142 (H) 65 - 99 mg/dL   BUN 19 6 - 20 mg/dL   Creatinine, Ser 0.860.83 0.61 - 1.24 mg/dL   Calcium 7.6 (L) 8.9 - 10.3 mg/dL   Total Protein 6.5 6.5 - 8.1 g/dL   Albumin 2.0 (L) 3.5 - 5.0 g/dL   AST 92 (H) 15 - 41  U/L   ALT 67 (H) 17 - 63 U/L   Alkaline Phosphatase 83 38 - 126 U/L   Total Bilirubin 6.3 (H) 0.3 - 1.2 mg/dL   GFR calc non Af Amer >60 >60 mL/min   GFR calc Af Amer >60 >60 mL/min   Anion gap 7 5 - 15  Protime-INR     Status: Abnormal   Collection Time: 09/03/15  8:08 AM  Result Value Ref Range   Prothrombin Time 18.2 (H) 11.6 - 15.2 seconds   INR 1.56 (H) 0.00 - 1.49    Studies/Results: No results found.    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

## 2015-09-03 NOTE — Progress Notes (Signed)
PULMONARY / CRITICAL CARE MEDICINE   Name: [name]: [id number] DOB: 11/28/1980    ADMISSION DATE:  08/20/2015 CONSULTATION DATE: 4/25  REFERRING MD:  [name]  CHIEF COMPLAINT:  GIB  HISTORY OF PRESENT ILLNESS:   35 yo admitted after episode of hematemesis and hematochezia on 4/24 with continued blood loss till anemia was down to 6.8. He required electrolyte replacement for tetany with mg++, Ca++ and K+. PCCM called to place CVL but due to HD instability. On 4/25  he was intubated at GI request to facilitate EGD. Therefore PCCM will assume his care till stable. He drinks up to a fifth of vodka daily. Reports prior episode of bloody stools but did not seek medical attention. PCCM will assume his care for now.  SUBJECTIVE:  He is poorly responsive, will moan and sometimes speak phrases  VITAL SIGNS: BP 106/59 mmHg  Pulse 91  Temp(Src) 98.5 F (36.9 C) (Axillary)  Resp 18  Ht 5\' 7"  (1.702 m)  Wt 396 lb 13.3 oz (180 kg)  BMI 62.14 kg/m2  SpO2 98%  HEMODYNAMICS:    VENTILATOR SETTINGS:    INTAKE / OUTPUT: I/O last 3 completed shifts: In: 2590 [I.V.:130; Other:240; NG/GT:2220] Out: -   PHYSICAL EXAMINATION: General: moaning HENT: NCAT, OP clear PULM : normal effort, no wheeze or crackles CV: RRR, no mgr GI: distended, BS+, shifting dullness to percussion; larger Derm: multiple tattoos, sacral erythema prob stage 1 without skin breakdown Neuro: very somnolent, not interacting or speaking, moves B UE  LABS:  BMET  Recent Labs Lab 08/31/15 0500 09/01/15 0500 09/02/15 0630  NA 147* 151* 152*  K 2.9* 3.0* 3.7  CL 113* 115* 117*  CO2 28 28 29   BUN 7 11 14   CREATININE 0.64 0.76 0.78  GLUCOSE 146* 157* 150*    Electrolytes  Recent Labs Lab 08/29/15 0607 08/30/15 0450 08/31/15 0500 09/01/15 0500 09/02/15 0630  CALCIUM 7.5*  --  7.5* 7.5* 7.5*  MG 1.9 1.7 2.0  --   --   PHOS 2.3* 1.4* 2.3*  --   --     CBC  Recent Labs Lab 09/01/15 0730  09/02/15 0630 09/03/15 0808  WBC 12.3* 14.0* 14.3*  HGB 7.4* 7.8* 7.9*  HCT 23.7* 25.3* 25.7*  PLT 115* 105* PENDING    Coag's  Recent Labs Lab 08/31/15 0500 09/03/15 0808  INR 1.99* 1.56*    Sepsis Markers  Recent Labs Lab 08/28/15 0407 08/29/15 0607  PROCALCITON 0.35 0.18    ABG No results for input(s): PHART, PCO2ART, PO2ART in the last 168 hours.  Liver Enzymes  Recent Labs Lab 08/29/15 0607 08/31/15 0500 09/01/15 0500  AST 198* 153* 124*  ALT 129* 107* 88*  ALKPHOS 51 66 76  BILITOT 6.3* 8.7* 7.6*  ALBUMIN 2.2* 2.2* 2.0*    Cardiac Enzymes No results for input(s): TROPONINI, PROBNP in the last 168 hours.  Glucose  Recent Labs Lab 09/02/15 1159 09/02/15 1557 09/02/15 2031 09/03/15 0050 09/03/15 0500 09/03/15 0722  GLUCAP 144* 147* 128* 135* 140* 130*    Imaging No results found.   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 Stigmata of recent bleeding were evident and red wale signs (nipple sign) were present. Active bleeding began during attempted banding but wall appeared to be scarred and could not successfully place a band on this area. Banding was unsuccessful despite six  attempts with incomplete eradication of varices. Steady bleeding continued at the end of the procedure. These were successfully injected with 4 mL ethanolamine for hemostasis. 4/27 ECHO:Left ventricle: The cavity size was normal. Systolic function was normal. The estimated ejection fraction was in the range of 55% to 60%. Wall motion was normal; there were no regional wall motion abnormalities. The transmitral flow pattern was normal. The deceleration time of the early transmitral flow velocity was normal. The pulmonary vein flow pattern was normal. The tissue Doppler parameters were normal. Left ventricular diastolic function parameters were normal. 5/1: abd US +  cirrhosis  CULTURES: 4/24 BCx2>> (-)  ANTIBIOTICS: 4/24 ceftriaxone>>5/1 5/1 cipro>>> 5/5  SIGNIFICANT EVENTS: 4/25 intubated for EGD: see above findings 4/26: requiring neo gtt,  Am labs showing AKI, NAG metabolic acidosis & Hyperglycemia.  4/27: AKI-->resolved; NAG metabolic acidosis-->d/t hyperchloremia and volume resuscitation efforts-->resolved; Residual Lactic acidosis-->resolved; Hyperchloremia --->resolved; HyperKalemia --->resolved -->bicarb gtt stopped. Hgb stable. Extubated 4/28 still w/ sig delirium. Still pressor dependent. LFTs increased. US pending. Changed PPI to BID and octreotide stopped.  5/1 US w/ evidence of cirrhosis. Still on precedex. Off pressors; but boarderline BP. para ordered by GI; Cipro started for continued SBP empiric coverage. Still confused 5/2 still confused. Actually on higher doses of precedex in spite of haldol. Retention enemas ordered. Started on low dose depakote.  FT placed. Family conf w/ father and mother. Made DNR but continue aggressive medical care.  5/3 FT re-positioned over-night. Starting VT lactulose, rifaximin, Risperdal, and low dose clonazepam. precedex d/c'd. TF started.  5/4 off precedex. Still agitated. A little more hypertensive. Had Para 5/3.  ~1 liter. No evidence of SBP. Adjusting depakote, and clonidine. Adding diuretics and propranolol. Family at bedside.  5/5: a little better MS wise. Backing off on clonazepam and risperidone. Ascites a little worse. Increasing TF to goal.  5/8 tapering sedating meds over weekend. Discomfort appearing to be major issue. Added D5W infusion   LINES/TUBES: 4/25 ET(anesthesia)>>4/27 4/25 rt i j cvl>>  DISCUSSION: Alcoholic with cirrhosis here with variceal bleeding and encephalopathy.  Bleeding has stopped, now with slow to resolve encephalopathy. Favor ICU related delirium and hepatic encephalopathy at this point. This is a challenging situation as his delirium has not improved much until 5/4,  remains hypersomnolent. We will continue lactulose, rifaximin, depakote and clonidine. Added propranolol and diuretic management for routine ascites and variceal prophylaxis and seems to be tolerating this. He does have increased ascites by exam. May end up needed repeat US paracentesis for resp mechanics. For today we will d/c clonazepam; change risperdal to HS only, try to get him OOB and treat pain as first line during episodes of restlessness. Famiyl updated.   ASSESSMENT / PLAN:  PULMONARY A: Respiratory failure resolved Aspiration risk P:   Monitor O2 saturation Mobilize  Asp precautions  Will assess for para today to decrease     CARDIOVASCULAR A:  Sinus tachycardia Hemorrhagic  Shock-->resolved residual hypotension> no clear cause, sedation vs cirrhosis related vs adrenal insuff-->resovled Volume overload  P:  Cont tele KVO IVFs Added spironolactone and lasix 5/4 Added low dose propranolol 5/4  RENAL A:   Hypokalemia and Hypomagnesemia; intermittent  Hypophophatemia Hypernatremia  P:   Free water replacement increased 5/6 Increase D5w 5/7 Replaced and recheck F/u am labs     GASTROINTESTINAL A:   Variceal bleed s/p EGD 4/25, injected but not able to band Alcoholic Cirrhosis w/ascites-->worse again 5/6 Transaminitis, uncertain etiology but improving Likely hepatic encephalopathy Protein cal malnutrition  P:   PPI BID  IF re-bleeds will need TIPS Lactulose scheduled Observe LFTs PRN titrate tubefeeds-->up to 55 cc/hr (goal)   HEMATOLOGIC A:   Acute Blood loss anemia from GIB (resolved); Now hgb reflecting anemia of critical illness. Coagulopathy in setting of cirrhosis (likely) ->s/p one more unit PRBC on 4/26; also vit K; repeat x fusion 5/1 P:  Avoid anticoagulants PAS Transfuse to keep Hgb > 7 now that stable   INFECTIOUS A:   No overt infection, was on IV abx empirically to cover presumed varices (completed 7d rx) -->PCT, fever and WBC  curve negative as of 5/2 -->s/p para 5/3. No evidence of SBP P:   cipro completed 5/1-5/5   ENDOCRINE A:   Adrenal insuff P:   ssi  Off steroids   NEUROLOGIC A:   ETOH withdrawal , no hx of DT's but + sz x 1;  Encephalopahty prob 2/2 hepatic enceph >> doing a little better in comparison w/ Friday. Still a little more sleepy and it seems as though episodes of restlessness are d/t pain more than true agitation.  P: RASS goal: 0 Cont scheduled Lactulose and rifaximin Continue scheduled Risperdal, decreased to HS only on 5/7 Will dc clonazepam  Low dose clonidine Treat pain Adjusted  valproic acid in half on 5/7 Maximize day-time interaction and attempt to get day/night sleep/wake schedule back Will get him OOB  FAMILY  - Updates: dad updated.  - Inter-disciplinary family meet or Palliative Care meeting: completed see note    Simonne Martinet ACNP-BC [facility] Pulmonary/Critical Care Pager # [phone] OR # [phone] if no answer

## 2015-09-03 NOTE — Progress Notes (Signed)
Nutrition Follow-up  DOCUMENTATION CODES:   Not applicable  INTERVENTION:  - Continue Vital 1.5 @ 55 mL/hr which is providing 1980 kcal, 89 grams of protein, and 1008 mL free water - Continue free water flush per MD/NP - RD will continue to monitor for additional needs, POC/GOC  NUTRITION DIAGNOSIS:   Inadequate oral intake related to inability to eat as evidenced by NPO status. -ongoing  GOAL:   Patient will meet greater than or equal to 90% of their needs -met with TF regimen  MONITOR:   TF tolerance, Weight trends, Labs, I & O's  ASSESSMENT:   [name] is an 35 y.o. white male who presented with the abrupt onset of hematemesis this evening with orthostatic dizziness and also noticed a bloody stool. Had a previous normal bowel movement 2 hours earlier. He denies any pain. He is alert and oriented and has not vomited since reaching the emergency room.  5/8 Per chart review, weight 5/3 was 75.9 kg and weight from this AM recording is 180 kg; pt with worsening ascites per notes and visible edema but question accuracy of this based on overall visualization of pt. Nutrition needs based on weight from 5/3.   Pt with NJT and currently receiving Vital 1.5 @ goal rate of 55 mL/hr. RN reports no issues related to TF this AM. Free water flush increased 5/6 with pt receiving 400 mL every 4 hours (2400 mL/24 hours). NP note from this AM indicates plan to assess for need for paracentesis given worsening ascites. Palliative Care following for [name]; will continue to monitor this throughout hospitalization.   Pt meeting needs with TF regimen; will monitor if pt able to be transitioned to PO diet if mentation improves and pt is safe for PO intake. Medications reviewed; 50 mg Aldactone/day, 20 mg Lasix/day, 40 mEq KCl every 8 hours. IVF: (start today) D5 @ 75 mL/hr (306 kcal). Labs reviewed; CBGs: 130-140 mg/dL this AM, Na: 153 mmol/L, CL: 117 mmol/L, Ca: 7.6 mg/dL, AST/ALT elevated. K WDL  and no Mg/Phos draw this AM.    5/5 - Pt with NJT currently receiving Vital 1.5 @ 40 mL/hr with 240 mL free water every 6 hours which is providing 1440 kcal, 65 grams of protein, and 1693 mL free water.  - RN at bedside reports she has not been informed of plan concerning TF yet.  - Spoke with NP who states TF can be advanced to goal rate today; RD will order. - Goal TF: Vital 1.5 @ 55 mL/hr which will provide 1980 kcal, 89 grams of protein, and 1008 mL free water; free water flush to be per MD/NP.  - No new weight since 5/3 but flow sheet indicates pt with mild-deep edema throughout body; will continue to monitor weight trends and nutrition needs will be based on closest estimated "dry weight."  - K: 2.9 mmol/L, Phos: 2.3 mg/dL, Mg WDL.    5/4 - Pt with soy allergy and Vital 1.5 is soy-free formula. - Pt with NJT in place and some agitation during visit this AM.  - Spoke with family member at bedside. - Pt is currently receiving Vital 1.5 @ 20 mL/hr.  - Spoke with NP who states he plans to increase rate to 40 mL/hr today.  - Per GI note this AM, pt with previous esophageal variceal bleeding s/p sclerotherapy with unsuccessful banding attempts and no evidence of ongoing bleeding at this time.  - Pt also with abdominal distention d/t ascites; bedside paracentesis 5/3  with 1 liter yellow fluid removed.  - GI note states that aspirate consistent with cirrhosis-mediated portal HTN. - Notes indicate pt with 2+ edema.   Diet Order:   NPO  Skin:  Reviewed, no issues  Last BM:  5/7  Height:   Ht Readings from Last 1 Encounters:  08/21/15 _0  (1.702 m)    Weight:   Wt Readings from Last 1 Encounters:  09/03/15 396 lb 13.3 oz (180 kg)    Ideal Body Weight:  67.27 kg  BMI:  Body mass index is 62.14 kg/(m^2).  Estimated Nutritional Needs:   Kcal:  1900-2100  Protein:  85-95 grams  Fluid:  >/= 1.9 L/day  EDUCATION NEEDS:   No education needs identified at this  time     Jarome Matin, RD, LDN Inpatient Clinical Dietitian Pager # [phone] After hours/weekend pager # [phone]

## 2015-09-04 ENCOUNTER — Inpatient Hospital Stay (HOSPITAL_COMMUNITY): Payer: Medicaid Other

## 2015-09-04 DIAGNOSIS — R188 Other ascites: Secondary | ICD-10-CM

## 2015-09-04 DIAGNOSIS — D72829 Elevated white blood cell count, unspecified: Secondary | ICD-10-CM

## 2015-09-04 LAB — URINALYSIS, ROUTINE W REFLEX MICROSCOPIC
Glucose, UA: NEGATIVE mg/dL
Hgb urine dipstick: NEGATIVE
Ketones, ur: NEGATIVE mg/dL
NITRITE: POSITIVE — AB
PROTEIN: NEGATIVE mg/dL
SPECIFIC GRAVITY, URINE: 1.03 (ref 1.005–1.030)
pH: 8.5 — ABNORMAL HIGH (ref 5.0–8.0)

## 2015-09-04 LAB — CBC
HEMATOCRIT: 23.7 % — AB (ref 39.0–52.0)
Hemoglobin: 7.5 g/dL — ABNORMAL LOW (ref 13.0–17.0)
MCH: 26.2 pg (ref 26.0–34.0)
MCHC: 31.6 g/dL (ref 30.0–36.0)
MCV: 82.9 fL (ref 78.0–100.0)
Platelets: 126 10*3/uL — ABNORMAL LOW (ref 150–400)
RBC: 2.86 MIL/uL — ABNORMAL LOW (ref 4.22–5.81)
RDW: 23.4 % — AB (ref 11.5–15.5)
WBC: 17.1 10*3/uL — ABNORMAL HIGH (ref 4.0–10.5)

## 2015-09-04 LAB — COMPREHENSIVE METABOLIC PANEL
ALBUMIN: 1.8 g/dL — AB (ref 3.5–5.0)
ALK PHOS: 81 U/L (ref 38–126)
ALT: 57 U/L (ref 17–63)
AST: 91 U/L — AB (ref 15–41)
Anion gap: 5 (ref 5–15)
BILIRUBIN TOTAL: 6.3 mg/dL — AB (ref 0.3–1.2)
BUN: 15 mg/dL (ref 6–20)
CALCIUM: 7.7 mg/dL — AB (ref 8.9–10.3)
CO2: 27 mmol/L (ref 22–32)
CREATININE: 0.72 mg/dL (ref 0.61–1.24)
Chloride: 114 mmol/L — ABNORMAL HIGH (ref 101–111)
GFR calc Af Amer: >60 mL/min (ref >60)
GFR calc non Af Amer: >60 mL/min (ref >60)
GLUCOSE: 130 mg/dL — AB (ref 65–99)
POTASSIUM: 4.4 mmol/L (ref 3.5–5.1)
Sodium: 146 mmol/L — ABNORMAL HIGH (ref 135–145)
TOTAL PROTEIN: 6.2 g/dL — AB (ref 6.5–8.1)

## 2015-09-04 LAB — GLUCOSE, CAPILLARY
GLUCOSE-CAPILLARY: 112 mg/dL — AB (ref 65–99)
GLUCOSE-CAPILLARY: 126 mg/dL — AB (ref 65–99)
Glucose-Capillary: 103 mg/dL — ABNORMAL HIGH (ref 65–99)
Glucose-Capillary: 110 mg/dL — ABNORMAL HIGH (ref 65–99)
Glucose-Capillary: 124 mg/dL — ABNORMAL HIGH (ref 65–99)
Glucose-Capillary: 125 mg/dL — ABNORMAL HIGH (ref 65–99)

## 2015-09-04 LAB — URINE MICROSCOPIC-ADD ON

## 2015-09-04 MED ORDER — LORAZEPAM 2 MG/ML IJ SOLN
1.0000 mg | Freq: Once | INTRAMUSCULAR | Status: AC
  Administered 2015-09-04: 1 mg via INTRAVENOUS
  Filled 2015-09-04: qty 1

## 2015-09-04 MED ORDER — DEXTROSE 5 % IV SOLN
1.0000 g | INTRAVENOUS | Status: AC
  Administered 2015-09-04 – 2015-09-07 (×4): 1 g via INTRAVENOUS
  Filled 2015-09-04 (×4): qty 10

## 2015-09-04 MED FILL — Benzocaine Mouth/Throat Aerosol 20%: OROMUCOSAL | Qty: 57 | Status: AC

## 2015-09-04 NOTE — Progress Notes (Signed)
PROGRESS NOTE  [name] XNA:[id number] DOB: 18-Jul-1980 DOA: 08/20/2015 PCP: No primary care provider on file.  HPI/Recap of past 24 hours: Patient is a 35 year old male with past mental history of alcohol abuse admitted on 4/24 with hematemesis and hematochezia and was found to have anemia secondary to esophageal varices. Endoscopy was conducted by GI which required patient to be intubated for this procedure and remained that way due to hemorrhagic shock from acute blood loss anemia. At that point, critical care took over for patient. Once patient able to be extubated, hospitalists assumed care.  Since that time, patient slowly progressing. Has required 2 paracenteses and treating other sequela of alcoholic cirrhosis. In the last few days, has become slightly more responsive although still delirious. He was receiving tube feeds.  Overnight, patient's white blood cell count trending upward, not 17. No fever. Patient complains of a distended abdomen but no pain. Denies a breathing issues.  The patient had self removed NG tube day prior feeds running. Patient himself has no complaints.  Nursing noted urinary retention and Foley catheter placed  Assessment/Plan: Principal Problem:   Acute blood loss anemia causing hemorrhagic shock: Shock resolved. Patient with some residual hypotension from possible cirrhosis versus continued use of lactulose. 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 Status post paracentesis with no evidence of SBP. On Lasix, propranolol, lactulose and rifaximin. EGD done 4/25 noted still bleeding varices, injected but not able to band. Continue PPI.  Patient given vitamin K for coagulopathy.  Critical care will do paracentesis for therapeutic tap in the next 24 hours  Leukocytosis: Unclear etiology. Chest x-ray is vague, no true  infiltrate noted. Given lack of fever and no hypoxia, not convinced about aspiration pneumonia. Awaiting urinalysis. Nursing reported urinary retention, so this may be the source.    Electrolyte abnormalities including hypomagnesemia, hypokalemia, hypocalcemia, hypernatremia and hypophosphatemia: Currently on D5W. Replacing potassium and other left lites as needed. Hypoalbuminemia    Acute hypoxemic respiratory failure [facility]): Requiring intubation. Able to be extubated on 4/27. Continued risk for aspiration so monitoring closely.     Encounter for palliative care   Goals of care, counseling/discussion Adrenal insufficiency: No longer on steroids, continue to monitor closely  Code Status: DO NOT RESUSCITATE, see below for goals of care   Family Communication: Mother at the bedside   Disposition Plan: Slowly improving. Palliative care involved. Family wants to see how patient progresses to determine disposition   Consultants:  Gastroenterology  Critical care  Palliative care   Procedures:  4/25-4/27: Intubation   4/25: EGD: Grade 2 esophageal varices, incompletely eradicated. Injected  5/5 Ultrasound guided paracentesis: 1 L of fluid removed  5/3: Ultrasound-guided paracentesis: Approximately 1 L of fluid removed  Antimicrobials:  IV Cipro 5/1-5/5  IV Rocephin 4/25-5/1   DVT prophylaxis:  SCDs given coagulopathy Objective: Filed Vitals:   09/03/15 1000 09/03/15 1200 09/03/15 2041 09/04/15 0557  BP: 129/83 122/90 92/83 109/75  Pulse:   84 91  Temp:  98.2 F (36.8 C) 98.6 F (37 C) 98.8 F (37.1 C)  TempSrc:  Oral Axillary Axillary  Resp: 15 15 16 16   Height:      Weight:    83.462 kg (184 lb)  SpO2: 99% 97% 96% 99%    Intake/Output Summary (Last 24 hours) at 09/04/15 1310 Last data filed at 09/04/15 1206  Gross per 24 hour  Intake      0 ml  Output  0 ml  Net      0 ml   Filed Weights   09/01/15 1130 09/03/15 0500 09/04/15 0557  Weight: 80.287 kg  (177 lb) 180 kg (396 lb 13.3 oz) 83.462 kg (184 lb)    Exam:   General:  Alert and oriented 2, no acute distress   Cardiovascular: Regular rate and rhythm, S1-S2  Respiratory: Clear to auscultation bilaterally  Abdomen: Soft, distended, nontender, hypoactive bowel sounds    Musculoskeletal: 1+ pitting edema    Skin: Questionable jaundice   Psychiatry: still somewhat confused, although reportedly this is improved     Data Reviewed: CBC:  Recent Labs Lab 09/01/15 0730 09/02/15 0630 09/03/15 0808 09/04/15 0630  WBC 12.3* 14.0* 14.3* 17.1*  HGB 7.4* 7.8* 7.9* 7.5*  HCT 23.7* 25.3* 25.7* 23.7*  MCV 82.6 82.1 83.7 82.9  PLT 115* 105* 116* 409*   Basic Metabolic Panel:  Recent Labs Lab 08/29/15 0607 08/30/15 0450 08/31/15 0500 09/01/15 0500 09/02/15 0630 09/03/15 0808 09/04/15 0630  NA 142  --  147* 151* 152* 153* 146*  K 4.0  --  2.9* 3.0* 3.7 4.3 4.4  CL 111  --  113* 115* 117* 117* 114*  CO2 25  --  28 28 29 29 27   GLUCOSE 135*  --  146* 157* 150* 142* 130*  BUN 6  --  7 11 14 19 15   CREATININE 0.69  --  0.64 0.76 0.78 0.83 0.72  CALCIUM 7.5*  --  7.5* 7.5* 7.5* 7.6* 7.7*  MG 1.9 1.7 2.0  --   --   --   --   PHOS 2.3* 1.4* 2.3*  --   --   --   --    GFR: Estimated Creatinine Clearance: 133.3 mL/min (by C-G formula based on Cr of 0.72). Liver Function Tests:  Recent Labs Lab 08/29/15 0607 08/31/15 0500 09/01/15 0500 09/03/15 0808 09/04/15 0630  AST 198* 153* 124* 92* 91*  ALT 129* 107* 88* 67* 57  ALKPHOS 51 66 76 83 81  BILITOT 6.3* 8.7* 7.6* 6.3* 6.3*  PROT 5.7* 6.2* 5.8* 6.5 6.2*  ALBUMIN 2.2* 2.2* 2.0* 2.0* 1.8*   No results for input(s): LIPASE, AMYLASE in the last 168 hours.  Recent Labs Lab 09/01/15 0800 09/03/15 0457  AMMONIA 41* 55*   Coagulation Profile:  Recent Labs Lab 08/31/15 0500 09/03/15 0808  INR 1.99* 1.56*   Cardiac Enzymes: No results for input(s): CKTOTAL, CKMB, CKMBINDEX, TROPONINI in the last 168  hours. BNP (last 3 results) No results for input(s): PROBNP in the last 8760 hours. HbA1C: No results for input(s): HGBA1C in the last 72 hours. CBG:  Recent Labs Lab 09/03/15 2008 09/04/15 0016 09/04/15 0441 09/04/15 0749 09/04/15 1152  GLUCAP 131* 124* 112* 125* 126*   Lipid Profile: No results for input(s): CHOL, HDL, LDLCALC, TRIG, CHOLHDL, LDLDIRECT in the last 72 hours. Thyroid Function Tests: No results for input(s): TSH, T4TOTAL, FREET4, T3FREE, THYROIDAB in the last 72 hours. Anemia Panel: No results for input(s): VITAMINB12, FOLATE, FERRITIN, TIBC, IRON, RETICCTPCT in the last 72 hours. Urine analysis: No results found for: COLORURINE, APPEARANCEUR, LABSPEC, PHURINE, GLUCOSEU, HGBUR, BILIRUBINUR, KETONESUR, PROTEINUR, UROBILINOGEN, NITRITE, LEUKOCYTESUR Sepsis Labs: @LABRCNTIP (procalcitonin:4,lacticidven:4)  ) Recent Results (from the past 240 hour(s))  Culture, body fluid-bottle     Status: None   Collection Time: 08/29/15 12:03 PM  Result Value Ref Range Status   Specimen Description FLUID ASCITIC  Final   Special Requests NONE  Final   Culture  Final    NO GROWTH 5 DAYS Performed at [facility]    Report Status 09/03/2015 FINAL  Final  Gram stain     Status: None   Collection Time: 08/29/15 12:03 PM  Result Value Ref Range Status   Specimen Description FLUID ASCITIC  Final   Special Requests NONE  Final   Gram Stain   Final    FEW WBC PRESENT,BOTH PMN AND MONONUCLEAR NO ORGANISMS SEEN Performed at [facility]    Report Status 08/29/2015 FINAL  Final      Studies: Dg Chest 1 View  09/04/2015  CLINICAL DATA:  Short of breath.  Weakness. EXAM: CHEST 1 VIEW COMPARISON:  08/22/2015 FINDINGS: There are low lung volumes. This accentuates the bronchovascular markings. Additional left mid and bilateral lung base opacity noted most likely atelectasis. Mild hazy airspace opacities noted in the perihilar regions. Consider mild pulmonary edema in  the proper clinical setting. No pleural effusion or pneumothorax. The cardiac silhouette is top-normal in size. Normal mediastinal and hilar contours. Right internal jugular central venous line is stable with its tip projecting in the right atrium. Enteric tube passes below the diaphragm well into the stomach and below the included field of view. IMPRESSION: 1. Exam somewhat limited by the semi-erect portable technique and low lung volumes. 2. Mild hazy perihilar opacity may be due to a combination of atelectasis and bronchovascular crowding. Consider mild pulmonary edema in the proper clinical setting. Electronically Signed   By: Lajean Manes M.D.   On: 09/04/2015 08:27    Scheduled Meds: . antiseptic oral rinse  7 mL Mouth Rinse QID  . chlorhexidine gluconate (SAGE KIT)  15 mL Mouth Rinse BID  . folic acid  1 mg Per Tube Daily  . free water  400 mL Per Tube Q4H  . furosemide  20 mg Per Tube Daily  . lactulose  30 g Per Tube TID  . pantoprazole sodium  40 mg Per Tube BID  . potassium chloride  40 mEq Per Tube Q8H  . propranolol  20 mg Per Tube TID  . rifaximin  550 mg Per Tube TID  . risperiDONE  1 mg Per Tube QHS  . spironolactone  50 mg Per Tube Daily  . thiamine  100 mg Per Tube Daily  . Valproate Sodium  250 mg Per Tube BID    Continuous Infusions: . dextrose 100 mL/hr at 09/03/15 1907  . feeding supplement (VITAL 1.5 CAL) 1,000 mL (09/04/15 0010)     LOS: 15 days   Time spent: 25 minutes   Annita Brod, MD [facility] Pager [phone]   If 7PM-7AM, please contact night-coverage [email or web address] Password [email or web address] 09/04/2015, 1:10 PM

## 2015-09-04 NOTE — Procedures (Signed)
Informed consent obtained from father Pt placed right lateral position.  Pt prepped w/ betadine.  Sterile drape placed.   The patient was then localized w/ 1% lidocaine, then needle advanced, and peritoneal fluid easily aspirated. After this catheter was advanced over needle. Following this the catheter was connected to suction device and total of 3 liters were easily aspirated.  The pt tolerated this well.  Fluid was clear yellow.   BP check w/ SBP @ 91. Decided to stop at this point to prevent further hypotension.  We then placed him in the left side lying position and sterile dressing was placed.  His father James HuaDavid was at the bedside for entire procedure; providing assistance and emotional support.   Pt tolerated well.   [name] ACNP-BC [facility] Pulmonary/Critical Care Pager # [phone] OR # [phone] if no answer

## 2015-09-04 NOTE — Progress Notes (Signed)
[facility] Progress Note  Subjective: The patient has been moved out of the ICU to a regular room. Lethargic. No obvious distress  Objective: Vital signs in last 24 hours: Temp:  [98.2 F (36.8 C)-98.8 F (37.1 C)] 98.8 F (37.1 C) (05/09 0557) Pulse Rate:  [84-91] 91 (05/09 0557) Resp:  [15-16] 16 (05/09 0557) BP: (92-129)/(75-90) 109/75 mmHg (05/09 0557) SpO2:  [96 %-99 %] 99 % (05/09 0557) Weight:  [83.462 kg (184 lb)] 83.462 kg (184 lb) (05/09 0557) Weight change: -96.538 kg (-212 lb 13.3 oz)   PE:  Heart regular rhythm  Lungs clear  Abdomen distended with ascites  Lab Results: Results for orders placed or performed during the hospital encounter of 08/20/15 (from the past 24 hour(s))  Glucose, capillary     Status: Abnormal   Collection Time: 09/03/15 11:52 AM  Result Value Ref Range   Glucose-Capillary 107 (H) 65 - 99 mg/dL  Glucose, capillary     Status: Abnormal   Collection Time: 09/03/15  5:05 PM  Result Value Ref Range   Glucose-Capillary 110 (H) 65 - 99 mg/dL   Comment 1 Notify RN    Comment 2 Document in Chart   Glucose, capillary     Status: Abnormal   Collection Time: 09/03/15  8:08 PM  Result Value Ref Range   Glucose-Capillary 131 (H) 65 - 99 mg/dL  Glucose, capillary     Status: Abnormal   Collection Time: 09/04/15 12:16 AM  Result Value Ref Range   Glucose-Capillary 124 (H) 65 - 99 mg/dL  Glucose, capillary     Status: Abnormal   Collection Time: 09/04/15  4:41 AM  Result Value Ref Range   Glucose-Capillary 112 (H) 65 - 99 mg/dL   Comment 1 Notify RN   Comprehensive metabolic panel     Status: Abnormal   Collection Time: 09/04/15  6:30 AM  Result Value Ref Range   Sodium 146 (H) 135 - 145 mmol/L   Potassium 4.4 3.5 - 5.1 mmol/L   Chloride 114 (H) 101 - 111 mmol/L   CO2 27 22 - 32 mmol/L   Glucose, Bld 130 (H) 65 - 99 mg/dL   BUN 15 6 - 20 mg/dL   Creatinine, Ser 1.610.72 0.61 - 1.24 mg/dL   Calcium 7.7 (L) 8.9 - 10.3 mg/dL   Total Protein 6.2 (L) 6.5 - 8.1 g/dL   Albumin 1.8 (L) 3.5 - 5.0 g/dL   AST 91 (H) 15 - 41 U/L   ALT 57 17 - 63 U/L   Alkaline Phosphatase 81 38 - 126 U/L   Total Bilirubin 6.3 (H) 0.3 - 1.2 mg/dL   GFR calc non Af Amer >60 >60 mL/min   GFR calc Af Amer >60 >60 mL/min   Anion gap 5 5 - 15  CBC     Status: Abnormal   Collection Time: 09/04/15  6:30 AM  Result Value Ref Range   WBC 17.1 (H) 4.0 - 10.5 K/uL   RBC 2.86 (L) 4.22 - 5.81 MIL/uL   Hemoglobin 7.5 (L) 13.0 - 17.0 g/dL   HCT 09.623.7 (L) 04.539.0 - 40.952.0 %   MCV 82.9 78.0 - 100.0 fL   MCH 26.2 26.0 - 34.0 pg   MCHC 31.6 30.0 - 36.0 g/dL   RDW 81.123.4 (H) 91.411.5 - 78.215.5 %   Platelets 126 (L) 150 - 400 K/uL  Glucose, capillary     Status: Abnormal   Collection Time: 09/04/15  7:49 AM  Result Value Ref Range  Glucose-Capillary 125 (H) 65 - 99 mg/dL    Studies/Results: Dg Chest 1 View  09/04/2015  CLINICAL DATA:  Short of breath.  Weakness. EXAM: CHEST 1 VIEW COMPARISON:  08/22/2015 FINDINGS: There are low lung volumes. This accentuates the bronchovascular markings. Additional left mid and bilateral lung base opacity noted most likely atelectasis. Mild hazy airspace opacities noted in the perihilar regions. Consider mild pulmonary edema in the proper clinical setting. No pleural effusion or pneumothorax. The cardiac silhouette is top-normal in size. Normal mediastinal and hilar contours. Right internal jugular central venous line is stable with its tip projecting in the right atrium. Enteric tube passes below the diaphragm well into the stomach and below the included field of view. IMPRESSION: 1. Exam somewhat limited by the semi-erect portable technique and low lung volumes. 2. Mild hazy perihilar opacity may be due to a combination of atelectasis and bronchovascular crowding. Consider mild pulmonary edema in the proper clinical setting. Electronically Signed   By: Amie Portland M.D.   On: 09/04/2015 08:27   Dg Abd Portable 1v  09/03/2015   CLINICAL DATA:  Feeding tube placement EXAM: PORTABLE ABDOMEN - 1 VIEW COMPARISON:  None. FINDINGS: Feeding tube tip is in the proximal stomach. Bowel gas pattern is normal. No free air evident. Lung bases clear. IMPRESSION: Feeding tube tip in body of stomach. Bowel gas pattern unremarkable. Electronically Signed   By: Bretta Bang III M.D.   On: 09/03/2015 12:21      Assessment: Alcoholic cirrhosis of the liver with ascites  Plan:   Continue supportive care. When necessary paracentesis    Gwenevere Abbot 09/04/2015, 9:04 AM  Pager: [phone] If no answer or after 5 PM call [phone]

## 2015-09-04 NOTE — Progress Notes (Signed)
[facility]  Chart reviewed Moved to floor Stable   PCCM will sign off  Heber CarolinaBrent McQuaid, MD [facility] Pager: [phone] Cell: [phone] After 3pm or if no response, call [phone]

## 2015-09-05 LAB — GLUCOSE, CAPILLARY
GLUCOSE-CAPILLARY: 107 mg/dL — AB (ref 65–99)
GLUCOSE-CAPILLARY: 111 mg/dL — AB (ref 65–99)
GLUCOSE-CAPILLARY: 113 mg/dL — AB (ref 65–99)
Glucose-Capillary: 107 mg/dL — ABNORMAL HIGH (ref 65–99)
Glucose-Capillary: 111 mg/dL — ABNORMAL HIGH (ref 65–99)
Glucose-Capillary: 107 mg/dL — ABNORMAL HIGH (ref 65–99)

## 2015-09-05 LAB — COMPREHENSIVE METABOLIC PANEL
ALBUMIN: 1.6 g/dL — AB (ref 3.5–5.0)
ALK PHOS: 77 U/L (ref 38–126)
ALT: 48 U/L (ref 17–63)
ANION GAP: 4 — AB (ref 5–15)
AST: 81 U/L — ABNORMAL HIGH (ref 15–41)
BILIRUBIN TOTAL: 5.4 mg/dL — AB (ref 0.3–1.2)
BUN: 12 mg/dL (ref 6–20)
CALCIUM: 7.6 mg/dL — AB (ref 8.9–10.3)
CO2: 25 mmol/L (ref 22–32)
CREATININE: 0.73 mg/dL (ref 0.61–1.24)
Chloride: 112 mmol/L — ABNORMAL HIGH (ref 101–111)
GFR calc Af Amer: >60 mL/min (ref >60)
GFR calc non Af Amer: >60 mL/min (ref >60)
GLUCOSE: 129 mg/dL — AB (ref 65–99)
Potassium: 4.6 mmol/L (ref 3.5–5.1)
SODIUM: 141 mmol/L (ref 135–145)
TOTAL PROTEIN: 5.9 g/dL — AB (ref 6.5–8.1)

## 2015-09-05 LAB — PREPARE RBC (CROSSMATCH)

## 2015-09-05 LAB — CBC
HCT: 22.4 % — ABNORMAL LOW (ref 39.0–52.0)
HEMOGLOBIN: 6.9 g/dL — AB (ref 13.0–17.0)
MCH: 25.4 pg — AB (ref 26.0–34.0)
MCHC: 30.8 g/dL (ref 30.0–36.0)
MCV: 82.4 fL (ref 78.0–100.0)
PLATELETS: 105 10*3/uL — AB (ref 150–400)
RBC: 2.72 MIL/uL — ABNORMAL LOW (ref 4.22–5.81)
RDW: 22.6 % — AB (ref 11.5–15.5)
WBC: 15.2 10*3/uL — ABNORMAL HIGH (ref 4.0–10.5)

## 2015-09-05 LAB — AMMONIA: Ammonia: 57 umol/L — ABNORMAL HIGH (ref 9–35)

## 2015-09-05 MED ORDER — SODIUM CHLORIDE 0.9 % IV SOLN
Freq: Once | INTRAVENOUS | Status: AC
  Administered 2015-09-05: 13:00:00 via INTRAVENOUS

## 2015-09-05 NOTE — Progress Notes (Signed)
PROGRESS NOTE  CAILAN GENERAL ITV:[id number] DOB: October 14, 1980 DOA: 08/20/2015 PCP: No primary care provider on file.  HPI/Recap of past 24 hours: Patient is a 35 year old male with past mental history of alcohol abuse admitted on 4/24 with hematemesis and hematochezia and was found to have anemia secondary to esophageal varices. Endoscopy was conducted by GI which required patient to be intubated for this procedure and remained that way due to hemorrhagic shock from acute blood loss anemia. At that point, critical care took over for patient. Once patient able to be extubated, hospitalists assumed care.  Since that time, patient slowly progressing. Has required 2 paracenteses and treating other sequela of alcoholic cirrhosis. In the last few days, has become slightly more responsive although still delirious. He was receiving tube feeds.  This AM, pt remains confused  Assessment/Plan:   Acute blood loss anemia causing hemorrhagic shock: Shock resolved. Patient with some residual hypotension from possible cirrhosis versus continued use of lactulose.    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 Status post paracentesis with no evidence of SBP. On Lasix, propranolol, lactulose and rifaximin. EGD done 4/25 noted still bleeding varices, injected but not able to band. Continue PPI.  Patient given vitamin K for coagulopathy. Abd remains mildly distended. Therapeutic paracentesis done on 5/9 yielding 3L ascites  Leukocytosis: Unclear etiology. Chest x-ray is vague, no true infiltrate noted. Given lack of fever and no hypoxia, not convinced about aspiration pneumonia. Awaiting urinalysis. Nursing reported urinary retention, so this may be the source. On rocephin.    Electrolyte abnormalities including hypomagnesemia, hypokalemia, hypocalcemia, hypernatremia and hypophosphatemia: Currently  on D5W. Replacing potassium and other left lites as needed.  Hypoalbuminemia    Acute hypoxemic respiratory failure [facility]): Requiring intubation. Able to be extubated on 4/27. Continued risk for aspiration so monitoring closely.     Encounter for palliative care   Goals of care, counseling/discussion Adrenal insufficiency: No longer on steroids, continue to monitor closely  Code Status: DO NOT RESUSCITATE  Family Communication: Pt in room. Family not at bedside  Disposition Plan: Slowly improving. Palliative care involved. Family wants to see how patient progresses to determine disposition   Consultants:  Gastroenterology  Critical care  Palliative care   Procedures:  4/25-4/27: Intubation   4/25: EGD: Grade 2 esophageal varices, incompletely eradicated. Injected  5/5 Ultrasound guided paracentesis: 1 L of fluid removed  5/3: Ultrasound-guided paracentesis: Approximately 1 L of fluid removed  Antimicrobials:  IV Cipro 5/1-5/5  IV Rocephin 4/25-5/1   DVT prophylaxis:  SCDs given coagulopathy Objective: Filed Vitals:   09/04/15 2052 09/05/15 0620 09/05/15 1315 09/05/15 1345  BP: 107/73 103/70 109/77 105/77  Pulse: 92 93 93 88  Temp: 99.1 F (37.3 C) 97.3 F (36.3 C)  98.6 F (37 C)  TempSrc: Axillary Oral Axillary Axillary  Resp: 16 16 16 16   Height:      Weight:  79.833 kg (176 lb)    SpO2: 100% 100% 100%     Intake/Output Summary (Last 24 hours) at 09/05/15 1533 Last data filed at 09/05/15 1530  Gross per 24 hour  Intake    284 ml  Output    200 ml  Net     84 ml   Filed Weights   09/03/15 0500 09/04/15 0557 09/05/15 0620  Weight: 180 kg (396 lb 13.3 oz) 83.462 kg (184 lb) 79.833 kg (176 lb)    Exam:   General:  Laying in bed lethargic, no acute  distress   Cardiovascular: Regular rate and rhythm, S1-S2  Respiratory: Clear to auscultation bilaterally  Abdomen: Soft, distended, nontender, hypoactive bowel sounds    Musculoskeletal: 1+  pitting edema    Skin: Questionable jaundice   Psychiatry: appears confused when aroused     Data Reviewed: CBC:  Recent Labs Lab 09/01/15 0730 09/02/15 0630 09/03/15 0808 09/04/15 0630 09/05/15 0500  WBC 12.3* 14.0* 14.3* 17.1* 15.2*  HGB 7.4* 7.8* 7.9* 7.5* 6.9*  HCT 23.7* 25.3* 25.7* 23.7* 22.4*  MCV 82.6 82.1 83.7 82.9 82.4  PLT 115* 105* 116* 126* 427*   Basic Metabolic Panel:  Recent Labs Lab 08/30/15 0450  08/31/15 0500 09/01/15 0500 09/02/15 0630 09/03/15 0808 09/04/15 0630 09/05/15 0500  NA  --   < > 147* 151* 152* 153* 146* 141  K  --   < > 2.9* 3.0* 3.7 4.3 4.4 4.6  CL  --   < > 113* 115* 117* 117* 114* 112*  CO2  --   < > 28 28 29 29 27 25   GLUCOSE  --   < > 146* 157* 150* 142* 130* 129*  BUN  --   < > 7 11 14 19 15 12   CREATININE  --   < > 0.64 0.76 0.78 0.83 0.72 0.73  CALCIUM  --   < > 7.5* 7.5* 7.5* 7.6* 7.7* 7.6*  MG 1.7  --  2.0  --   --   --   --   --   PHOS 1.4*  --  2.3*  --   --   --   --   --   < > = values in this interval not displayed. GFR: Estimated Creatinine Clearance: 130.5 mL/min (by C-G formula based on Cr of 0.73). Liver Function Tests:  Recent Labs Lab 08/31/15 0500 09/01/15 0500 09/03/15 0808 09/04/15 0630 09/05/15 0500  AST 153* 124* 92* 91* 81*  ALT 107* 88* 67* 57 48  ALKPHOS 66 76 83 81 77  BILITOT 8.7* 7.6* 6.3* 6.3* 5.4*  PROT 6.2* 5.8* 6.5 6.2* 5.9*  ALBUMIN 2.2* 2.0* 2.0* 1.8* 1.6*   No results for input(s): LIPASE, AMYLASE in the last 168 hours.  Recent Labs Lab 09/01/15 0800 09/03/15 0457 09/05/15 1050  AMMONIA 41* 55* 57*   Coagulation Profile:  Recent Labs Lab 08/31/15 0500 09/03/15 0808  INR 1.99* 1.56*   Cardiac Enzymes: No results for input(s): CKTOTAL, CKMB, CKMBINDEX, TROPONINI in the last 168 hours. BNP (last 3 results) No results for input(s): PROBNP in the last 8760 hours. HbA1C: No results for input(s): HGBA1C in the last 72 hours. CBG:  Recent Labs Lab 09/04/15 2003  09/05/15 0006 09/05/15 0359 09/05/15 0722 09/05/15 1211  GLUCAP 110* 113* 107* 111* 111*   Lipid Profile: No results for input(s): CHOL, HDL, LDLCALC, TRIG, CHOLHDL, LDLDIRECT in the last 72 hours. Thyroid Function Tests: No results for input(s): TSH, T4TOTAL, FREET4, T3FREE, THYROIDAB in the last 72 hours. Anemia Panel: No results for input(s): VITAMINB12, FOLATE, FERRITIN, TIBC, IRON, RETICCTPCT in the last 72 hours. Urine analysis:    Component Value Date/Time   COLORURINE ORANGE* 09/04/2015 1300   APPEARANCEUR CLEAR 09/04/2015 1300   LABSPEC 1.030 09/04/2015 1300   PHURINE 8.5* 09/04/2015 1300   GLUCOSEU NEGATIVE 09/04/2015 1300   HGBUR NEGATIVE 09/04/2015 1300   BILIRUBINUR LARGE* 09/04/2015 1300   KETONESUR NEGATIVE 09/04/2015 1300   PROTEINUR NEGATIVE 09/04/2015 1300   NITRITE POSITIVE* 09/04/2015 1300   LEUKOCYTESUR SMALL* 09/04/2015 1300  Sepsis Labs: @LABRCNTIP (procalcitonin:4,lacticidven:4)  ) Recent Results (from the past 240 hour(s))  Culture, body fluid-bottle     Status: None   Collection Time: 08/29/15 12:03 PM  Result Value Ref Range Status   Specimen Description FLUID ASCITIC  Final   Special Requests NONE  Final   Culture   Final    NO GROWTH 5 DAYS Performed at [facility]    Report Status 09/03/2015 FINAL  Final  Gram stain     Status: None   Collection Time: 08/29/15 12:03 PM  Result Value Ref Range Status   Specimen Description FLUID ASCITIC  Final   Special Requests NONE  Final   Gram Stain   Final    FEW WBC PRESENT,BOTH PMN AND MONONUCLEAR NO ORGANISMS SEEN Performed at [facility]    Report Status 08/29/2015 FINAL  Final      Studies: No results found.  Scheduled Meds: . antiseptic oral rinse  7 mL Mouth Rinse QID  . cefTRIAXone (ROCEPHIN)  IV  1 g Intravenous Q24H  . chlorhexidine gluconate (SAGE KIT)  15 mL Mouth Rinse BID  . folic acid  1 mg Per Tube Daily  . free water  400 mL Per Tube Q4H  . furosemide   20 mg Per Tube Daily  . lactulose  30 g Per Tube TID  . pantoprazole sodium  40 mg Per Tube BID  . potassium chloride  40 mEq Per Tube Q8H  . propranolol  20 mg Per Tube TID  . rifaximin  550 mg Per Tube TID  . risperiDONE  1 mg Per Tube QHS  . spironolactone  50 mg Per Tube Daily  . thiamine  100 mg Per Tube Daily  . Valproate Sodium  250 mg Per Tube BID    Continuous Infusions: . dextrose 100 mL/hr at 09/05/15 1253  . feeding supplement (VITAL 1.5 CAL) 1,000 mL (09/05/15 0238)     LOS: 16 days   Time spent: 25 minutes   CHIU, Orpah Melter, MD [facility] Pager [phone]   If 7PM-7AM, please contact night-coverage [email or web address] Password [facility] 09/05/2015, 3:33 PM

## 2015-09-05 NOTE — Progress Notes (Signed)
[facility] Progress Note  Subjective: The patient seems a little more alert today and I have seen him before. His mother states he was sitting up in a chair yesterday. He had about 3 L of ascites tapped yesterday.  Objective: Vital signs in last 24 hours: Temp:  [97.3 F (36.3 C)-99.1 F (37.3 C)] 97.3 F (36.3 C) (05/10 0620) Pulse Rate:  [92-93] 93 (05/10 0620) Resp:  [16] 16 (05/10 0620) BP: (91-107)/(60-73) 103/70 mmHg (05/10 0620) SpO2:  [100 %] 100 % (05/10 0620) Weight:  [79.833 kg (176 lb)] 79.833 kg (176 lb) (05/10 0620) Weight change: -3.629 kg (-8 lb)   PE:  No distress  Heart regular rhythm  Lungs clear  Abdomen: Nontender, ascites still present  Lab Results: Results for orders placed or performed during the hospital encounter of 08/20/15 (from the past 24 hour(s))  Glucose, capillary     Status: Abnormal   Collection Time: 09/04/15 11:52 AM  Result Value Ref Range   Glucose-Capillary 126 (H) 65 - 99 mg/dL  Urinalysis, Routine w reflex microscopic (not at [facility])     Status: Abnormal   Collection Time: 09/04/15  1:00 PM  Result Value Ref Range   Color, Urine ORANGE (A) YELLOW   APPearance CLEAR CLEAR   Specific Gravity, Urine 1.030 1.005 - 1.030   pH 8.5 (H) 5.0 - 8.0   Glucose, UA NEGATIVE NEGATIVE mg/dL   Hgb urine dipstick NEGATIVE NEGATIVE   Bilirubin Urine LARGE (A) NEGATIVE   Ketones, ur NEGATIVE NEGATIVE mg/dL   Protein, ur NEGATIVE NEGATIVE mg/dL   Nitrite POSITIVE (A) NEGATIVE   Leukocytes, UA SMALL (A) NEGATIVE  Urine microscopic-add on     Status: Abnormal   Collection Time: 09/04/15  1:00 PM  Result Value Ref Range   Squamous Epithelial / LPF 0-5 (A) NONE SEEN   WBC, UA 0-5 0 - 5 WBC/hpf   RBC / HPF 0-5 0 - 5 RBC/hpf   Bacteria, UA RARE (A) NONE SEEN   Urine-Other MUCOUS PRESENT   Glucose, capillary     Status: Abnormal   Collection Time: 09/04/15  4:22 PM  Result Value Ref Range   Glucose-Capillary 103 (H) 65 - 99 mg/dL    Comment 1 Notify RN    Comment 2 Document in Chart   Glucose, capillary     Status: Abnormal   Collection Time: 09/04/15  8:03 PM  Result Value Ref Range   Glucose-Capillary 110 (H) 65 - 99 mg/dL  Glucose, capillary     Status: Abnormal   Collection Time: 09/05/15 12:06 AM  Result Value Ref Range   Glucose-Capillary 113 (H) 65 - 99 mg/dL   Comment 1 Notify RN   Glucose, capillary     Status: Abnormal   Collection Time: 09/05/15  3:59 AM  Result Value Ref Range   Glucose-Capillary 107 (H) 65 - 99 mg/dL   Comment 1 Notify RN   Comprehensive metabolic panel     Status: Abnormal   Collection Time: 09/05/15  5:00 AM  Result Value Ref Range   Sodium 141 135 - 145 mmol/L   Potassium 4.6 3.5 - 5.1 mmol/L   Chloride 112 (H) 101 - 111 mmol/L   CO2 25 22 - 32 mmol/L   Glucose, Bld 129 (H) 65 - 99 mg/dL   BUN 12 6 - 20 mg/dL   Creatinine, Ser 1.61 0.61 - 1.24 mg/dL   Calcium 7.6 (L) 8.9 - 10.3 mg/dL   Total Protein 5.9 (L) 6.5 -  8.1 g/dL   Albumin 1.6 (L) 3.5 - 5.0 g/dL   AST 81 (H) 15 - 41 U/L   ALT 48 17 - 63 U/L   Alkaline Phosphatase 77 38 - 126 U/L   Total Bilirubin 5.4 (H) 0.3 - 1.2 mg/dL   GFR calc non Af Amer >60 >60 mL/min   GFR calc Af Amer >60 >60 mL/min   Anion gap 4 (L) 5 - 15  CBC     Status: Abnormal   Collection Time: 09/05/15  5:00 AM  Result Value Ref Range   WBC 15.2 (H) 4.0 - 10.5 K/uL   RBC 2.72 (L) 4.22 - 5.81 MIL/uL   Hemoglobin 6.9 (LL) 13.0 - 17.0 g/dL   HCT 96.022.4 (L) 45.439.0 - 09.852.0 %   MCV 82.4 78.0 - 100.0 fL   MCH 25.4 (L) 26.0 - 34.0 pg   MCHC 30.8 30.0 - 36.0 g/dL   RDW 11.922.6 (H) 14.711.5 - 82.915.5 %   Platelets 105 (L) 150 - 400 K/uL  Glucose, capillary     Status: Abnormal   Collection Time: 09/05/15  7:22 AM  Result Value Ref Range   Glucose-Capillary 111 (H) 65 - 99 mg/dL   Comment 1 Notify RN    Comment 2 Document in Chart     Studies/Results: No results found.    Assessment: Alcoholic cirrhosis of the liver  Esophageal  varices  Ascites  Hepatic encephalopathy  Plan:   Continue current management.    Gwenevere AbbotSAM F Alayssa Flinchum 09/05/2015, 9:08 AM  Pager: [phone] If no answer or after 5 PM call [phone]

## 2015-09-05 NOTE — Progress Notes (Signed)
CRITICAL VALUE ALERT  Critical value received:  Hemaglobin  Date of notification:   09/05/2015  Time of notification:  0655  Critical value read back:Yes.   yes Nurse who received alert:  Fredrich RomansBianca Shraga Custard  MD notified (1st page):  K.Schorr  Time of first page:  0700

## 2015-09-06 LAB — COMPREHENSIVE METABOLIC PANEL
ALBUMIN: 1.7 g/dL — AB (ref 3.5–5.0)
ALT: 39 U/L (ref 17–63)
ANION GAP: 8 (ref 5–15)
AST: 72 U/L — ABNORMAL HIGH (ref 15–41)
Alkaline Phosphatase: 74 U/L (ref 38–126)
BILIRUBIN TOTAL: 5.7 mg/dL — AB (ref 0.3–1.2)
BUN: 9 mg/dL (ref 6–20)
CO2: 24 mmol/L (ref 22–32)
Calcium: 7.7 mg/dL — ABNORMAL LOW (ref 8.9–10.3)
Chloride: 105 mmol/L (ref 101–111)
Creatinine, Ser: 0.61 mg/dL (ref 0.61–1.24)
GFR calc non Af Amer: >60 mL/min (ref >60)
GLUCOSE: 127 mg/dL — AB (ref 65–99)
POTASSIUM: 4.6 mmol/L (ref 3.5–5.1)
SODIUM: 137 mmol/L (ref 135–145)
TOTAL PROTEIN: 5.8 g/dL — AB (ref 6.5–8.1)

## 2015-09-06 LAB — GLUCOSE, CAPILLARY
GLUCOSE-CAPILLARY: 109 mg/dL — AB (ref 65–99)
GLUCOSE-CAPILLARY: 113 mg/dL — AB (ref 65–99)
GLUCOSE-CAPILLARY: 118 mg/dL — AB (ref 65–99)
GLUCOSE-CAPILLARY: 120 mg/dL — AB (ref 65–99)
GLUCOSE-CAPILLARY: 92 mg/dL (ref 65–99)
Glucose-Capillary: 97 mg/dL (ref 65–99)

## 2015-09-06 LAB — TYPE AND SCREEN
ABO/RH(D): O NEG
Antibody Screen: NEGATIVE
Unit division: 0

## 2015-09-06 LAB — CBC
HEMATOCRIT: 25.5 % — AB (ref 39.0–52.0)
HEMOGLOBIN: 8.2 g/dL — AB (ref 13.0–17.0)
MCH: 26.4 pg (ref 26.0–34.0)
MCHC: 32.2 g/dL (ref 30.0–36.0)
MCV: 82 fL (ref 78.0–100.0)
Platelets: 127 10*3/uL — ABNORMAL LOW (ref 150–400)
RBC: 3.11 MIL/uL — AB (ref 4.22–5.81)
RDW: 22.1 % — ABNORMAL HIGH (ref 11.5–15.5)
WBC: 15.7 10*3/uL — ABNORMAL HIGH (ref 4.0–10.5)

## 2015-09-06 MED ORDER — LORAZEPAM 2 MG/ML IJ SOLN
1.0000 mg | Freq: Once | INTRAMUSCULAR | Status: AC
  Administered 2015-09-06: 1 mg via INTRAVENOUS
  Filled 2015-09-06: qty 1

## 2015-09-06 MED ORDER — FREE WATER: 200.0000 mL | Freq: Four times a day (QID) | Status: DC

## 2015-09-06 MED ORDER — FREE WATER
200.0000 mL | Freq: Four times a day (QID) | Status: DC
  Administered 2015-09-07: 200 mL

## 2015-09-06 MED ORDER — PANTOPRAZOLE SODIUM 40 MG IV SOLR
40.0000 mg | Freq: Two times a day (BID) | INTRAVENOUS | Status: DC
  Administered 2015-09-06 – 2015-09-07 (×2): 40 mg via INTRAVENOUS
  Filled 2015-09-06 (×2): qty 40

## 2015-09-06 NOTE — Progress Notes (Signed)
PROGRESS NOTE  [name] TDD:[id number] DOB: 01-Jan-1981 DOA: 08/20/2015 PCP: No primary care provider on file.  HPI/Recap of past 24 hours: Patient is a 35 year old male with past mental history of alcohol abuse admitted on 4/24 with hematemesis and hematochezia and was found to have anemia secondary to esophageal varices. Endoscopy was conducted by GI which required patient to be intubated for this procedure and remained that way due to hemorrhagic shock from acute blood loss anemia. At that point, critical care took over for patient. Once patient able to be extubated, hospitalists assumed care.  Since that time, patient slowly progressing. Has required 2 paracenteses and treating other sequela of alcoholic cirrhosis. In the last few days, has become slightly more responsive although still delirious. He was receiving tube feeds.  Today, patient is more awake. Eager to have NG taken out  Assessment/Plan: Acute blood loss anemia causing hemorrhagic shock: Shock resolved. BP stable.  Continued ETOH abuse causing alcoholic cirrhosis with secondary ascites/transaminitis/hepatic encephalopathy/coagulopathy/variceal bleed/thrombocytopenia: Active drinker. Status post paracentesis with no evidence of SBP. On Lasix, propranolol, lactulose and rifaximin. EGD done 4/25 noted still bleeding varices, injected but not able to band. Continue PPI.  Patient given vitamin K for coagulopathy. Abd remains mildly distended. Therapeutic paracentesis was done on 5/9 yielding 3L ascites. Limit fluids as tolerated  Leukocytosis: Unclear etiology. Chest x-ray is vague, no true infiltrate noted. Given lack of fever and no hypoxia, not convinced about aspiration pneumonia. Awaiting urinalysis. Nursing reported urinary retention, so this may be the source. On rocephin.    Electrolyte abnormalities including hypomagnesemia, hypokalemia, hypocalcemia, hypernatremia and hypophosphatemia: Currently on D5W. Replacing  potassium and other left lites as needed.  Hypoalbuminemia   Acute hypoxemic respiratory failure [facility]): Requiring intubation. Able to be extubated on 4/27. Continued risk for aspiration so monitoring closely.    Encounter for palliative care   Goals of care, counseling/discussion Adrenal insufficiency: No longer on steroids, continue to monitor closely  FEN: - Pt had pulled out NG on 5/11. Pt is awake. Will obtain SLP and if tolerates diet, would transition meds to PO  Code Status: DO NOT RESUSCITATE  Family Communication: Pt in room. Family not at bedside  Disposition Plan: Slowly improving. Palliative care involved.   Consultants:  Gastroenterology  Critical care  Palliative care   Procedures:  4/25-4/27: Intubation   4/25: EGD: Grade 2 esophageal varices, incompletely eradicated. Injected  5/5 Ultrasound guided paracentesis: 1 L of fluid removed  5/3: Ultrasound-guided paracentesis: Approximately 1 L of fluid removed  Antimicrobials:  IV Cipro 5/1-5/5  IV Rocephin 4/25-5/1   DVT prophylaxis:  SCDs given coagulopathy Objective: Filed Vitals:   09/05/15 1537 09/05/15 2019 09/06/15 0449 09/06/15 1243  BP: 99/73 102/63 119/67 115/80  Pulse: 86 86 85 87  Temp: 98.4 F (36.9 C) 98.4 F (36.9 C) 98.9 F (37.2 C) 98.5 F (36.9 C)  TempSrc: Axillary Oral Oral Oral  Resp: 20 18 18 16   Height:      Weight:   80.287 kg (177 lb)   SpO2: 99% 100% 100% 100%    Intake/Output Summary (Last 24 hours) at 09/06/15 1324 Last data filed at 09/06/15 1036  Gross per 24 hour  Intake    284 ml  Output   1650 ml  Net  -1366 ml   Filed Weights   09/04/15 0557 09/05/15 0620 09/06/15 0449  Weight: 83.462 kg (184 lb) 79.833 kg (176 lb) 80.287 kg (177 lb)    Exam:   General:  Laying in bed, awake, no acute distress   Cardiovascular: Regular rate and rhythm, S1-S2  Respiratory: Clear to auscultation bilaterally  Abdomen: Soft, distended, nontender, hypoactive  bowel sounds    Musculoskeletal: 1+ pitting edema    Skin: Questionable jaundice   Psychiatry: alert, no auditory hallucinations   Data Reviewed: CBC:  Recent Labs Lab 09/02/15 0630 09/03/15 0808 09/04/15 0630 09/05/15 0500 09/06/15 0500  WBC 14.0* 14.3* 17.1* 15.2* 15.7*  HGB 7.8* 7.9* 7.5* 6.9* 8.2*  HCT 25.3* 25.7* 23.7* 22.4* 25.5*  MCV 82.1 83.7 82.9 82.4 82.0  PLT 105* 116* 126* 105* 578*   Basic Metabolic Panel:  Recent Labs Lab 08/31/15 0500  09/02/15 0630 09/03/15 0808 09/04/15 0630 09/05/15 0500 09/06/15 0500  NA 147*  < > 152* 153* 146* 141 137  K 2.9*  < > 3.7 4.3 4.4 4.6 4.6  CL 113*  < > 117* 117* 114* 112* 105  CO2 28  < > 29 29 27 25 24   GLUCOSE 146*  < > 150* 142* 130* 129* 127*  BUN 7  < > 14 19 15 12 9   CREATININE 0.64  < > 0.78 0.83 0.72 0.73 0.61  CALCIUM 7.5*  < > 7.5* 7.6* 7.7* 7.6* 7.7*  MG 2.0  --   --   --   --   --   --   PHOS 2.3*  --   --   --   --   --   --   < > = values in this interval not displayed. GFR: Estimated Creatinine Clearance: 130.9 mL/min (by C-G formula based on Cr of 0.61). Liver Function Tests:  Recent Labs Lab 09/01/15 0500 09/03/15 0808 09/04/15 0630 09/05/15 0500 09/06/15 0500  AST 124* 92* 91* 81* 72*  ALT 88* 67* 57 48 39  ALKPHOS 76 83 81 77 74  BILITOT 7.6* 6.3* 6.3* 5.4* 5.7*  PROT 5.8* 6.5 6.2* 5.9* 5.8*  ALBUMIN 2.0* 2.0* 1.8* 1.6* 1.7*   No results for input(s): LIPASE, AMYLASE in the last 168 hours.  Recent Labs Lab 09/01/15 0800 09/03/15 0457 09/05/15 1050  AMMONIA 41* 55* 57*   Coagulation Profile:  Recent Labs Lab 08/31/15 0500 09/03/15 0808  INR 1.99* 1.56*   Cardiac Enzymes: No results for input(s): CKTOTAL, CKMB, CKMBINDEX, TROPONINI in the last 168 hours. BNP (last 3 results) No results for input(s): PROBNP in the last 8760 hours. HbA1C: No results for input(s): HGBA1C in the last 72 hours. CBG:  Recent Labs Lab 09/05/15 1606 09/05/15 2005 09/06/15 0004  09/06/15 0440 09/06/15 0724  GLUCAP 107* 107* 120* 109* 118*   Lipid Profile: No results for input(s): CHOL, HDL, LDLCALC, TRIG, CHOLHDL, LDLDIRECT in the last 72 hours. Thyroid Function Tests: No results for input(s): TSH, T4TOTAL, FREET4, T3FREE, THYROIDAB in the last 72 hours. Anemia Panel: No results for input(s): VITAMINB12, FOLATE, FERRITIN, TIBC, IRON, RETICCTPCT in the last 72 hours. Urine analysis:    Component Value Date/Time   COLORURINE ORANGE* 09/04/2015 1300   APPEARANCEUR CLEAR 09/04/2015 1300   LABSPEC 1.030 09/04/2015 1300   PHURINE 8.5* 09/04/2015 1300   GLUCOSEU NEGATIVE 09/04/2015 1300   HGBUR NEGATIVE 09/04/2015 1300   BILIRUBINUR LARGE* 09/04/2015 1300   KETONESUR NEGATIVE 09/04/2015 1300   PROTEINUR NEGATIVE 09/04/2015 1300   NITRITE POSITIVE* 09/04/2015 1300   LEUKOCYTESUR SMALL* 09/04/2015 1300   Sepsis Labs: @LABRCNTIP (procalcitonin:4,lacticidven:4)  ) Recent Results (from the past 240 hour(s))  Culture, body fluid-bottle     Status: None  Collection Time: 08/29/15 12:03 PM  Result Value Ref Range Status   Specimen Description FLUID ASCITIC  Final   Special Requests NONE  Final   Culture   Final    NO GROWTH 5 DAYS Performed at [facility]    Report Status 09/03/2015 FINAL  Final  Gram stain     Status: None   Collection Time: 08/29/15 12:03 PM  Result Value Ref Range Status   Specimen Description FLUID ASCITIC  Final   Special Requests NONE  Final   Gram Stain   Final    FEW WBC PRESENT,BOTH PMN AND MONONUCLEAR NO ORGANISMS SEEN Performed at [facility]    Report Status 08/29/2015 FINAL  Final      Studies: No results found.  Scheduled Meds: . antiseptic oral rinse  7 mL Mouth Rinse QID  . cefTRIAXone (ROCEPHIN)  IV  1 g Intravenous Q24H  . chlorhexidine gluconate (SAGE KIT)  15 mL Mouth Rinse BID  . folic acid  1 mg Per Tube Daily  . [START ON 09/07/2015] free water  200 mL Per Tube Q6H  . furosemide  20 mg  Per Tube Daily  . lactulose  30 g Per Tube TID  . pantoprazole sodium  40 mg Per Tube BID  . potassium chloride  40 mEq Per Tube Q8H  . propranolol  20 mg Per Tube TID  . rifaximin  550 mg Per Tube TID  . risperiDONE  1 mg Per Tube QHS  . spironolactone  50 mg Per Tube Daily  . thiamine  100 mg Per Tube Daily  . Valproate Sodium  250 mg Per Tube BID    Continuous Infusions: . dextrose 100 mL/hr at 09/06/15 1038  . feeding supplement (VITAL 1.5 CAL) 1,000 mL (09/05/15 1846)     LOS: 17 days   Time spent: 25 minutes   Durinda Buzzelli, Orpah Melter, MD [facility] Pager [phone]   If 7PM-7AM, please contact night-coverage [email or web address] Password [email or web address] 09/06/2015, 1:24 PM

## 2015-09-06 NOTE — Progress Notes (Signed)
Patient accidentally pulled Panda out enough that it had to be removed, Dr Rhona Leavenshiu notified. Will not replace, speech evaluation will be ordered. Will continue to monitor.

## 2015-09-06 NOTE — Progress Notes (Signed)
Nutrition Follow-up  INTERVENTION:   Patient is meeting needs with tube feeding at goal rate. Please address need for D5 infusion @ 100 ml/hr.  - Continue Vital 1.5 @ 55 mL/hr  - Free water flushes of 200 ml every 6 hours -This TF regimen provides 1980 kcal, 89 grams of protein, and 1808 mL free water - RD will continue to monitor for additional needs, POC/GOC  NUTRITION DIAGNOSIS:   Inadequate oral intake related to inability to eat as evidenced by NPO status.  Ongoing.  GOAL:   Patient will meet greater than or equal to 90% of their needs  Meeting with TF  MONITOR:   TF tolerance, Weight trends, Labs, I & O's  ASSESSMENT:   James BalDaniel W Best is an 35 y.o. white male who presented with the abrupt onset of hematemesis this evening with orthostatic dizziness and also noticed a bloody stool. Had a previous normal bowel movement 2 hours earlier. He denies any pain. He is alert and oriented and has not vomited since reaching the emergency room.  Patient continues to receive Vital 1.5 @ 55 ml/hr via NJT, tolerating. Per MD during interdisciplinary rounds, pt receiving a high amount of free water flushes (400 ml every 4 hours). Asked RD to adjust order as patient with ascites and receiving IVF as well (D5 infusion @ 100 ml/hr). Uncertain if D5 infusion is necessary since pt is meeting needs with tube feeding.  RN has already provided pt with 800 ml of free water today. Will d/c additional orders today and flushes are to resume 5/12, 200 ml every 6 hours to better meet fluid needs.   Medications: Folic acid tablet daily,  Lasix daily, KCl every 6 hours, Thiamine tablet daily, D5 infusion @ 100 ml/hr -provides 408 kcal Labs reviewed: CBGs: 109-118  Diet Order:     Skin:  Reviewed, no issues  Last BM:  5/10  Height:   Ht Readings from Last 1 Encounters:  08/21/15 5\' 7"  (1.702 m)    Weight:   Wt Readings from Last 1 Encounters:  09/06/15 177 lb (80.287 kg)    Ideal  Body Weight:  67.27 kg  BMI:  Body mass index is 27.72 kg/(m^2).  Estimated Nutritional Needs:   Kcal:  1900-2100  Protein:  85-95 grams  Fluid:  >/= 1.9 L/day  EDUCATION NEEDS:   No education needs identified at this time  James FrancoLindsey Talullah Abate, MS, RD, LDN Pager: [phone] After Hours Pager: [phone]

## 2015-09-06 NOTE — Progress Notes (Signed)
Nutrition Brief Note  Per MD note, pt has removed his NGT. Plan is for SLP evaluation and possible diet advancement.   RD to follow-up 5/12 to assess for further nutritional needs.  Tilda FrancoLindsey Sravya Grissom, MS, RD, LDN Pager: [phone] After Hours Pager: [phone]

## 2015-09-06 NOTE — Progress Notes (Signed)
[facility] Progress Note  Subjective: No further signs of bleeding. Remains lethargic. Family states however that he is more alert when he is awake and he has been  Objective: Vital signs in last 24 hours: Temp:  [98.4 F (36.9 C)-98.9 F (37.2 C)] 98.9 F (37.2 C) (05/11 0449) Pulse Rate:  [85-93] 85 (05/11 0449) Resp:  [16-20] 18 (05/11 0449) BP: (99-119)/(63-77) 119/67 mmHg (05/11 0449) SpO2:  [99 %-100 %] 100 % (05/11 0449) Weight:  [80.287 kg (177 lb)] 80.287 kg (177 lb) (05/11 0449) Weight change: 0.454 kg (1 lb)   PE:  No distress  Abdomen: Ascites  Lab Results: Results for orders placed or performed during the hospital encounter of 08/20/15 (from the past 24 hour(s))  Glucose, capillary     Status: Abnormal   Collection Time: 09/05/15 12:11 PM  Result Value Ref Range   Glucose-Capillary 111 (H) 65 - 99 mg/dL   Comment 1 Notify RN    Comment 2 Document in Chart   Glucose, capillary     Status: Abnormal   Collection Time: 09/05/15  4:06 PM  Result Value Ref Range   Glucose-Capillary 107 (H) 65 - 99 mg/dL   Comment 1 Notify RN    Comment 2 Document in Chart   Glucose, capillary     Status: Abnormal   Collection Time: 09/05/15  8:05 PM  Result Value Ref Range   Glucose-Capillary 107 (H) 65 - 99 mg/dL  Glucose, capillary     Status: Abnormal   Collection Time: 09/06/15 12:04 AM  Result Value Ref Range   Glucose-Capillary 120 (H) 65 - 99 mg/dL  Glucose, capillary     Status: Abnormal   Collection Time: 09/06/15  4:40 AM  Result Value Ref Range   Glucose-Capillary 109 (H) 65 - 99 mg/dL  Comprehensive metabolic panel     Status: Abnormal   Collection Time: 09/06/15  5:00 AM  Result Value Ref Range   Sodium 137 135 - 145 mmol/L   Potassium 4.6 3.5 - 5.1 mmol/L   Chloride 105 101 - 111 mmol/L   CO2 24 22 - 32 mmol/L   Glucose, Bld 127 (H) 65 - 99 mg/dL   BUN 9 6 - 20 mg/dL   Creatinine, Ser 4.690.61 0.61 - 1.24 mg/dL   Calcium 7.7 (L) 8.9 - 10.3  mg/dL   Total Protein 5.8 (L) 6.5 - 8.1 g/dL   Albumin 1.7 (L) 3.5 - 5.0 g/dL   AST 72 (H) 15 - 41 U/L   ALT 39 17 - 63 U/L   Alkaline Phosphatase 74 38 - 126 U/L   Total Bilirubin 5.7 (H) 0.3 - 1.2 mg/dL   GFR calc non Af Amer >60 >60 mL/min   GFR calc Af Amer >60 >60 mL/min   Anion gap 8 5 - 15  CBC     Status: Abnormal   Collection Time: 09/06/15  5:00 AM  Result Value Ref Range   WBC 15.7 (H) 4.0 - 10.5 K/uL   RBC 3.11 (L) 4.22 - 5.81 MIL/uL   Hemoglobin 8.2 (L) 13.0 - 17.0 g/dL   HCT 62.925.5 (L) 52.839.0 - 41.352.0 %   MCV 82.0 78.0 - 100.0 fL   MCH 26.4 26.0 - 34.0 pg   MCHC 32.2 30.0 - 36.0 g/dL   RDW 24.422.1 (H) 01.011.5 - 27.215.5 %   Platelets 127 (L) 150 - 400 K/uL  Glucose, capillary     Status: Abnormal   Collection Time: 09/06/15  7:24 AM  Result Value Ref Range   Glucose-Capillary 118 (H) 65 - 99 mg/dL   Comment 1 Notify RN    Comment 2 Document in Chart     Studies/Results: No results found.    Assessment: Cirrhosis of the liver  Hepatic encephalopathy  Ascites  Plan:   Continue medical management and supportive care    Gwenevere Abbot 09/06/2015, 11:12 AM  Pager: [phone] If no answer or after 5 PM call [phone]

## 2015-09-07 ENCOUNTER — Inpatient Hospital Stay (HOSPITAL_COMMUNITY): Payer: Medicaid Other

## 2015-09-07 LAB — GLUCOSE, CAPILLARY
GLUCOSE-CAPILLARY: 95 mg/dL (ref 65–99)
Glucose-Capillary: 102 mg/dL — ABNORMAL HIGH (ref 65–99)
Glucose-Capillary: 104 mg/dL — ABNORMAL HIGH (ref 65–99)
Glucose-Capillary: 107 mg/dL — ABNORMAL HIGH (ref 65–99)
Glucose-Capillary: 88 mg/dL (ref 65–99)
Glucose-Capillary: 91 mg/dL (ref 65–99)

## 2015-09-07 MED ORDER — FUROSEMIDE 10 MG/ML IJ SOLN
40.0000 mg | Freq: Every day | INTRAMUSCULAR | Status: DC
  Administered 2015-09-08: 40 mg via INTRAVENOUS
  Filled 2015-09-07: qty 4

## 2015-09-07 MED ORDER — LACTULOSE 10 GM/15ML PO SOLN
30.0000 g | Freq: Three times a day (TID) | ORAL | Status: DC
  Administered 2015-09-07 – 2015-09-08 (×4): 30 g via ORAL
  Filled 2015-09-07 (×4): qty 45

## 2015-09-07 MED ORDER — RIFAXIMIN 550 MG PO TABS
550.0000 mg | ORAL_TABLET | Freq: Three times a day (TID) | ORAL | Status: DC
  Administered 2015-09-07 – 2015-09-08 (×4): 550 mg via ORAL
  Filled 2015-09-07 (×5): qty 1

## 2015-09-07 MED ORDER — POTASSIUM CHLORIDE CRYS ER 20 MEQ PO TBCR
40.0000 meq | EXTENDED_RELEASE_TABLET | Freq: Every day | ORAL | Status: DC
  Administered 2015-09-07 – 2015-09-08 (×2): 40 meq via ORAL
  Filled 2015-09-07 (×2): qty 2

## 2015-09-07 MED ORDER — PANTOPRAZOLE SODIUM 40 MG PO TBEC
40.0000 mg | DELAYED_RELEASE_TABLET | Freq: Every day | ORAL | Status: DC
  Administered 2015-09-07 – 2015-09-08 (×2): 40 mg via ORAL
  Filled 2015-09-07 (×2): qty 1

## 2015-09-07 MED ORDER — PROPRANOLOL HCL ER 60 MG PO CP24
60.0000 mg | ORAL_CAPSULE | Freq: Every day | ORAL | Status: DC
  Administered 2015-09-08: 60 mg via ORAL
  Filled 2015-09-07 (×2): qty 1

## 2015-09-07 MED ORDER — SPIRONOLACTONE 25 MG PO TABS
50.0000 mg | ORAL_TABLET | Freq: Every day | ORAL | Status: DC
  Administered 2015-09-08: 50 mg via ORAL
  Filled 2015-09-07: qty 2

## 2015-09-07 MED ORDER — FOLIC ACID 1 MG PO TABS
1.0000 mg | ORAL_TABLET | Freq: Every day | ORAL | Status: DC
  Administered 2015-09-08: 1 mg via ORAL
  Filled 2015-09-07: qty 1

## 2015-09-07 MED ORDER — LORAZEPAM 2 MG/ML IJ SOLN: 0.5000 mg | Freq: Once | INTRAMUSCULAR | Status: DC

## 2015-09-07 MED ORDER — VITAMIN B-1 100 MG PO TABS
100.0000 mg | ORAL_TABLET | Freq: Every day | ORAL | Status: DC
  Administered 2015-09-08: 100 mg via ORAL
  Filled 2015-09-07: qty 1

## 2015-09-07 NOTE — Progress Notes (Signed)
The patient is doing well today. He is sitting up smiling and talking to people in his room. He seems to be in good spirits. I discussed his case with Dr. Rhona Leavenshiu. We will sign off at this point. Call us if needed.

## 2015-09-07 NOTE — Progress Notes (Signed)
Patient is much improved from acute ETOH hepatitis and stable with chronic cirrhosis. Prognosis remains poor but he is close to returning to his baseline. Recommend follow at liver clinic with ETOH abuse treatment options and optimize all liver disease meds and related prophylaxis. Palliative is available as an outpatient home based referral if needed or desired by patient and the family. Will sign off for now- please call if his condition changes or there are new symptom management needs.  James Kabir GoAnderson Maltalding, DO Palliative Medicine [phone]

## 2015-09-07 NOTE — Progress Notes (Signed)
Chaplain providing continued support with family around hospitalization.   Pt's father present today.  Expresses hopefulness and gratitude that [name] is improving.  Stated he and mother are trying to be intentional about finding rest for themselves.     Will continue to follow for support.  Please page as needs arise.     Belva CromeStalnaker, Raylyn Carton Wayne MDiv

## 2015-09-07 NOTE — Procedures (Signed)
Ultrasound-guided therapeutic paracentesis performed yielding 2.6 liters of yellow fluid. No immediate complications.  

## 2015-09-07 NOTE — Progress Notes (Signed)
Nutrition Follow-up  DOCUMENTATION CODES:   Not applicable  INTERVENTION:  -RD to continue to monitor -Regular diet, thin liquids per SLP  NUTRITION DIAGNOSIS:   Inadequate oral intake related to inability to eat as evidenced by NPO status. No longer valid, will follow to determine new Dx  GOAL:   Patient will meet greater than or equal to 90% of their needs Not meeting  MONITOR:   TF tolerance, Weight trends, Labs, I & O's   ASSESSMENT:   [name] is an 35 y.o. white male who presented with the abrupt onset of hematemesis this evening with orthostatic dizziness and also noticed a bloody stool. Had a previous normal bowel movement 2 hours earlier. He denies any pain. He is alert and oriented and has not vomited since reaching the emergency room.  Mr. [name] recently pulled his NGT twice. He was seen by SLP this morning, deemed mild aspiration risk, cleared for PO intake. Appetite is good per pt. Will continue to monitor for PO intake.  Labs and medications reviewed: Ca 7.7, Phos 2.3 D5 @ 16400mL/hr -> 408 calories, Banana Bag  Diet Order:  Diet Heart Room service appropriate?: Yes; Fluid consistency:: Thin  Skin:  Reviewed, no issues  Last BM:  5/10  Height:   Ht Readings from Last 1 Encounters:  08/21/15 5\' 7"  (1.702 m)    Weight:   Wt Readings from Last 1 Encounters:  09/07/15 171 lb (77.565 kg)    Ideal Body Weight:  67.27 kg  BMI:  Body mass index is 26.78 kg/(m^2).  Estimated Nutritional Needs:   Kcal:  1900-2100  Protein:  85-95 grams  Fluid:  >/= 1.9 L/day  EDUCATION NEEDS:   No education needs identified at this time  Dionne AnoWilliam M. Gwyneth Fernandez, MS, RD LDN Inpatient Clinical Dietitian Pager: [phone]

## 2015-09-07 NOTE — Evaluation (Signed)
Clinical/Bedside Swallow Evaluation Patient Details  Name: James BalDaniel W Stangl MRN: [id number] Date of Birth: 1980/08/05  Today's Date: 09/07/2015 Time: SLP Start Time (ACUTE ONLY): 0955 SLP Stop Time (ACUTE ONLY): 1015 SLP Time Calculation (min) (ACUTE ONLY): 20 min  Past Medical History:  Past Medical History  Diagnosis Date  . GERD (gastroesophageal reflux disease)   . ETOH abuse    Past Surgical History:  Past Surgical History  Procedure Laterality Date  . Esophagogastroduodenoscopy Left 08/21/2015    Procedure: ESOPHAGOGASTRODUODENOSCOPY (EGD);  Surgeon: Charlott RakesVincent Schooler, MD;  Location: [facility];  Service: Endoscopy;  Laterality: Left;   HPI:  35 year old male admitted 08/20/15 due to acute blood loss from hematemesis and bloody stool. Pt was intubated 4/25-27/17, and has had NGT until yesterday. PMH significant for GERD and ETOH abuse. Orders received for bedside swallow assessment to determine readiness to begin po intake, s/p NGT removal by pt yesterday.    Assessment / Plan / Recommendation Clinical Impression  Pt presents with adequate oral motor strength and function. He is missing some teeth, but has adequate dentition for regular po intake. Oral care was completed with suction, which pt tolerated well. Pt exhibited no overt s/s aspiration on any consistency tested, and has no history of swallowing difficulty prior to admit. Pt was intubated for 2 days, but has been extubated since 08/23/15. Recommend regular diet and thin liquids, beginning with bland items first, given length of time NPO and GI issues recently. Safe swallow precautions including strategies for management of [facility]motility [facility] to follow up after the weekend for assessment of diet tolerance. Per MD, will begin heart healthy/low sodium diet, and will defer additional restrictions and/or supplements to RD.   Cognitive evaluation may be beneficial if confusion continues.   Aspiration  Risk  Mild aspiration risk    Diet Recommendation Regular;Thin liquid   Liquid Administration via: Cup;Straw Medication Administration: Whole meds with liquid Supervision: Patient able to self feed;Intermittent supervision to cue for compensatory strategies Compensations: Minimize environmental distractions;Slow rate;Small sips/bites;Follow solids with liquid Postural Changes: Seated upright at 90 degrees;Remain upright for at least 30 minutes after po intake    Other  Recommendations Oral Care Recommendations: Oral care BID Other Recommendations: Clarify dietary restrictions   Follow up Recommendations   (TBD.)    Frequency and Duration min 1 x/week  1 week       Prognosis Prognosis for Safe Diet Advancement: Good      Swallow Study   General Date of Onset: 08/20/15 HPI: 35 year old male admitted 08/20/15 due to acute blood loss from hematemesis and bloody stool. Pt was intubated 4/25-27/17, and has had NGT until yesterday. PMH significant for GERD and ETOH abuse. Orders received for bedside swallow assessment to determine readiness to begin po intake.  Type of Study: Bedside Swallow Evaluation Previous Swallow Assessment: none found Diet Prior to this Study: Regular;Thin liquids Temperature Spikes Noted: No Respiratory Status: Room air History of Recent Intubation: Yes Length of Intubations (days): 2 days Date extubated: 08/23/15 Behavior/Cognition: Alert;Cooperative;Pleasant mood (confused) Oral Cavity Assessment: Within Functional Limits Oral Care Completed by SLP: Yes Oral Cavity - Dentition: Adequate natural dentition;Missing dentition Vision: Functional for self-feeding Self-Feeding Abilities: Able to feed self Patient Positioning: Upright in bed Baseline Vocal Quality: Normal Volitional Cough: Strong Volitional Swallow: Able to elicit    Oral/Motor/Sensory Function Overall Oral Motor/Sensory Function: Within functional limits   Ice Chips Ice chips: Not tested    Thin Liquid  Thin Liquid: Within functional limits Presentation: Cup;Self Fed;Straw    Nectar Thick Nectar Thick Liquid: Not tested   Honey Thick Honey Thick Liquid: Not tested   Puree Puree: Within functional limits Presentation: Self Fed;Spoon   Solid   GO   Solid: Within functional limits Presentation: Self Fed        Christo Hain Brown 09/07/2015,10:35 AM  Harlow Asa. Murvin Natal [facility], CCC-SLP [phone]

## 2015-09-07 NOTE — Evaluation (Signed)
Physical Therapy Evaluation Patient Details Name: [name]: [id number] DOB: 02-27-1981 Today's Date: 09/07/2015   History of Present Illness  Patient is a 35 year old male with past mental history of alcohol abuse admitted on 4/24 with hematemesis and hematochezia and was found to have anemia secondary to esophageal varices. Endoscopy was conducted by GI which required patient to be intubated and extubated 08/23/15. has had paracentesis x 2.  Clinical Impression  The patient  Presents with  Generalized weakness. Ambulated a short 8 ' today with 2 assist for safety.. The patient will benefit  from PT to address the problems listed in the note below.    Follow Up Recommendations  HHPT, 24 supervision(unless has no family caregivers. then SNF)    Equipment Recommendations  Rolling walker with 5" wheels    Recommendations for Other Services       Precautions / Restrictions Precautions Precautions: Fall Precaution Comments: scrotal edema      Mobility  Bed Mobility Overal bed mobility: Needs Assistance Bed Mobility: Supine to Sit     Supine to sit: Min assist     General bed mobility comments: patient scooted on his legs flexed to avoid  pressure on the scrotum. assist with trunk to lean forward. Abdomen distension inhibits sitting upright.  Transfers Overall transfer level: Needs assistance Equipment used: Rolling walker (2 wheeled) Transfers: Sit to/from Stand Sit to Stand: Mod assist         General transfer comment: cues for safety and for power up to stand. practiced x 2.   Ambulation/Gait Ambulation/Gait assistance: +2 safety/equipment Ambulation Distance (Feet): 8 Feet Assistive device: Rolling walker (2 wheeled) Gait Pattern/deviations: Step-to pattern;Staggering left;Ataxic     General Gait Details: decreased  control of the legs, walked forward then backward to recliner.  Stairs            Wheelchair Mobility    Modified Rankin  (Stroke Patients Only)       Balance Overall balance assessment: Needs assistance Sitting-balance support: Feet supported;Bilateral upper extremity supported Sitting balance-Leahy Scale: Fair Sitting balance - Comments: difficult to lean forward due to abdomen   Standing balance support: During functional activity;Bilateral upper extremity supported Standing balance-Leahy Scale: Poor                               Pertinent Vitals/Pain Pain Assessment: Faces Faces Pain Scale: Hurts even more Pain Location: scrotal area Pain Descriptors / Indicators: Discomfort;Pressure Pain Intervention(s): Limited activity within patient's tolerance;Monitored during session;Repositioned    Home Living Family/patient expects to be discharged to:: Private residence Living Arrangements: Parent Available Help at Discharge: Family Type of Home: House Home Access: Stairs to enter   Secretary/administrator of Steps: level Home Layout: Two level Home Equipment: None      Prior Function Level of Independence: Independent               Hand Dominance        Extremity/Trunk Assessment   Upper Extremity Assessment: Generalized weakness           Lower Extremity Assessment: Generalized weakness      Cervical / Trunk Assessment: Other exceptions  Communication   Communication: No difficulties  Cognition Arousal/Alertness: Awake/alert Behavior During Therapy: WFL for tasks assessed/performed Overall Cognitive Status: Impaired/Different from baseline Area of Impairment: Orientation Orientation Level: Time   Memory: Decreased short-term memory  General Comments      Exercises        Assessment/Plan    PT Assessment Patient needs continued PT services  PT Diagnosis Difficulty walking;Generalized weakness;Acute pain   PT Problem List Decreased strength;Decreased range of motion;Decreased mobility;Decreased knowledge of precautions;Decreased  safety awareness;Decreased knowledge of use of DME;Pain  PT Treatment Interventions DME instruction;Gait training;Stair training;Functional mobility training;Therapeutic activities;Therapeutic exercise;Patient/family education   PT Goals (Current goals can be found in the Care Plan section) Acute Rehab PT Goals Patient Stated Goal: to get stronger PT Goal Formulation: With patient Time For Goal Achievement: 09/21/15 Potential to Achieve Goals: Good    Frequency Min 3X/week   Barriers to discharge        Co-evaluation               End of Session Equipment Utilized During Treatment: Gait belt Activity Tolerance: Patient limited by fatigue Patient left: in chair;with call bell/phone within reach;with chair alarm set Nurse Communication: Mobility status         Time: 1401-1416 PT Time Calculation (min) (ACUTE ONLY): 15 min   Charges:   PT Evaluation $PT Eval Low Complexity: 1 Procedure     PT G CodesRada Hay:        Arrion Burruel Elizabeth 09/07/2015, 3:06 PM 09/07/2015 Blanchard KelchKaren Ranny Wiebelhaus PT [phone]

## 2015-09-07 NOTE — Progress Notes (Signed)
PROGRESS NOTE  [name] NLZ:[id number] DOB: 07/02/1980 DOA: 08/20/2015 PCP: No primary care provider on file.  HPI/Recap of past 24 hours: Patient is a 35 year old male with past mental history of alcohol abuse admitted on 4/24 with hematemesis and hematochezia and was found to have anemia secondary to esophageal varices. Endoscopy was conducted by GI which required patient to be intubated for this procedure and remained that way due to hemorrhagic shock from acute blood loss anemia. At that point, critical care took over for patient. Once patient able to be extubated, hospitalists assumed care.  Since that time, patient slowly progressing. Has required 2 paracenteses and treating other sequela of alcoholic cirrhosis. In the last few days, has become slightly more responsive although still delirious. He was receiving tube feeds.  Today, patient is more awake. Eager to have NG taken out  Assessment/Plan: Acute blood loss anemia causing hemorrhagic shock: Shock resolved. BP stable.  Continued ETOH abuse causing alcoholic cirrhosis with secondary ascites/transaminitis/hepatic encephalopathy/coagulopathy/variceal bleed/thrombocytopenia: Active drinker. Status post paracentesis with no evidence of SBP. On Lasix, propranolol, lactulose and rifaximin. EGD done 4/25 noted still bleeding varices, injected but not able to band. Continue PPI.  Patient given vitamin K for coagulopathy. Abd remains mildly distended. Therapeutic paracentesis was done on 5/9 yielding 3L ascites. Limit fluids as tolerated  Leukocytosis: Unclear etiology. Chest x-ray is vague, no true infiltrate noted. Given lack of fever and no hypoxia, not convinced about aspiration pneumonia. Awaiting urinalysis. Nursing reported urinary retention, so this may be the source. On rocephin.  Electrolyte abnormalities including hypomagnesemia, hypokalemia, hypocalcemia, hypernatremia and hypophosphatemia: Currently on D5W. Replacing  potassium and other left lites as needed.  Hypoalbuminemia   Acute hypoxemic respiratory failure [facility]): Requiring intubation. Able to be extubated on 4/27. Continued risk for aspiration so monitoring closely.  Adrenal insufficiency: No longer on steroids, continue to monitor closely  FEN: - Pt had pulled out NG on 5/11. Pt is awake and oriented.  - Passed SLP eval. Transitioned to oral meds  Code Status: DO NOT RESUSCITATE  Family Communication: Pt in room. Family at bedside  Disposition Plan: Slowly improving. Palliative care involved, PT/OT consulted.   Consultants:  Gastroenterology  Critical care  Palliative care   Procedures:  4/25-4/27: Intubation   4/25: EGD: Grade 2 esophageal varices, incompletely eradicated. Injected  5/5 Ultrasound guided paracentesis: 1 L of fluid removed  5/3: Ultrasound-guided paracentesis: Approximately 1 L of fluid removed  Antimicrobials:  IV Cipro 5/1-5/5  IV Rocephin 4/25-5/1   DVT prophylaxis:  SCDs given coagulopathy Objective: Filed Vitals:   09/06/15 2031 09/07/15 0459 09/07/15 1045 09/07/15 1100  BP: _0 94/62  Pulse: 79 77    Temp: 98 F (36.7 C) 98.2 F (36.8 C)    TempSrc: Oral Oral    Resp: 16 16    Height:      Weight:  77.565 kg (171 lb)    SpO2: 97% 100%      Intake/Output Summary (Last 24 hours) at 09/07/15 1347 Last data filed at 09/07/15 0701  Gross per 24 hour  Intake      0 ml  Output   2900 ml  Net  -2900 ml   Filed Weights   09/05/15 0620 09/06/15 0449 09/07/15 0459  Weight: 79.833 kg (176 lb) 80.287 kg (177 lb) 77.565 kg (171 lb)    Exam:   General:  Laying in bed, awake, no acute distress   Cardiovascular: Regular rate and rhythm, S1-S2  Respiratory:  Clear to auscultation bilaterally  Abdomen: distended, ascitic, pos BS  Musculoskeletal: 1+ pitting edema    Skin: Jaundiced, no abnormal skin lesions seen  Psychiatry: alert, no auditory hallucinations   Data  Reviewed: CBC:  Recent Labs Lab 09/02/15 0630 09/03/15 0808 09/04/15 0630 09/05/15 0500 09/06/15 0500  WBC 14.0* 14.3* 17.1* 15.2* 15.7*  HGB 7.8* 7.9* 7.5* 6.9* 8.2*  HCT 25.3* 25.7* 23.7* 22.4* 25.5*  MCV 82.1 83.7 82.9 82.4 82.0  PLT 105* 116* 126* 105* 785*   Basic Metabolic Panel:  Recent Labs Lab 09/02/15 0630 09/03/15 0808 09/04/15 0630 09/05/15 0500 09/06/15 0500  NA 152* 153* 146* 141 137  K 3.7 4.3 4.4 4.6 4.6  CL 117* 117* 114* 112* 105  CO2 _0 GLUCOSE 150* 142* 130* 129* 127*  BUN _1 CREATININE 0.78 0.83 0.72 0.73 0.61  CALCIUM 7.5* 7.6* 7.7* 7.6* 7.7*   GFR: Estimated Creatinine Clearance: 120.5 mL/min (by C-G formula based on Cr of 0.61). Liver Function Tests:  Recent Labs Lab 09/01/15 0500 09/03/15 0808 09/04/15 0630 09/05/15 0500 09/06/15 0500  AST 124* 92* 91* 81* 72*  ALT 88* 67* 57 48 39  ALKPHOS 76 83 81 77 74  BILITOT 7.6* 6.3* 6.3* 5.4* 5.7*  PROT 5.8* 6.5 6.2* 5.9* 5.8*  ALBUMIN 2.0* 2.0* 1.8* 1.6* 1.7*   No results for input(s): LIPASE, AMYLASE in the last 168 hours.  Recent Labs Lab 09/01/15 0800 09/03/15 0457 09/05/15 1050  AMMONIA 41* 55* 57*   Coagulation Profile:  Recent Labs Lab 09/03/15 0808  INR 1.56*   Cardiac Enzymes: No results for input(s): CKTOTAL, CKMB, CKMBINDEX, TROPONINI in the last 168 hours. BNP (last 3 results) No results for input(s): PROBNP in the last 8760 hours. HbA1C: No results for input(s): HGBA1C in the last 72 hours. CBG:  Recent Labs Lab 09/06/15 2011 09/07/15 0028 09/07/15 0433 09/07/15 0755 09/07/15 1206  GLUCAP 97 91 95 88 102*   Lipid Profile: No results for input(s): CHOL, HDL, LDLCALC, TRIG, CHOLHDL, LDLDIRECT in the last 72 hours. Thyroid Function Tests: No results for input(s): TSH, T4TOTAL, FREET4, T3FREE, THYROIDAB in the last 72 hours. Anemia Panel: No results for input(s): VITAMINB12, FOLATE, FERRITIN, TIBC, IRON, RETICCTPCT in the last  72 hours. Urine analysis:    Component Value Date/Time   COLORURINE ORANGE* 09/04/2015 1300   APPEARANCEUR CLEAR 09/04/2015 1300   LABSPEC 1.030 09/04/2015 1300   PHURINE 8.5* 09/04/2015 1300   GLUCOSEU NEGATIVE 09/04/2015 1300   HGBUR NEGATIVE 09/04/2015 1300   BILIRUBINUR LARGE* 09/04/2015 1300   KETONESUR NEGATIVE 09/04/2015 1300   PROTEINUR NEGATIVE 09/04/2015 1300   NITRITE POSITIVE* 09/04/2015 1300   LEUKOCYTESUR SMALL* 09/04/2015 1300   Sepsis Labs: _2 (procalcitonin:4,lacticidven:4)  ) Recent Results (from the past 240 hour(s))  Culture, body fluid-bottle     Status: None   Collection Time: 08/29/15 12:03 PM  Result Value Ref Range Status   Specimen Description FLUID ASCITIC  Final   Special Requests NONE  Final   Culture   Final    NO GROWTH 5 DAYS Performed at [facility]    Report Status 09/03/2015 FINAL  Final  Gram stain     Status: None   Collection Time: 08/29/15 12:03 PM  Result Value Ref Range Status   Specimen Description FLUID ASCITIC  Final   Special Requests NONE  Final   Gram Stain   Final    FEW WBC PRESENT,BOTH PMN AND  MONONUCLEAR NO ORGANISMS SEEN Performed at [facility]    Report Status 08/29/2015 FINAL  Final      Studies: No results found.  Scheduled Meds: . antiseptic oral rinse  7 mL Mouth Rinse QID  . cefTRIAXone (ROCEPHIN)  IV  1 g Intravenous Q24H  . chlorhexidine gluconate (SAGE KIT)  15 mL Mouth Rinse BID  . [START ON 7/33/4483] folic acid  1 mg Oral Daily  . furosemide  40 mg Intravenous Daily  . lactulose  30 g Oral TID  . LORazepam  0.5 mg Intravenous Once  . pantoprazole  40 mg Oral Daily  . potassium chloride  40 mEq Oral Daily  . propranolol ER  60 mg Oral Daily  . rifaximin  550 mg Oral TID  . [START ON 09/08/2015] spironolactone  50 mg Oral Daily  . [START ON 09/08/2015] thiamine  100 mg Oral Daily    Continuous Infusions: . dextrose 100 mL/hr at 09/06/15 1038     LOS: 18 days   Time  spent: 25 minutes   Kilea Mccarey, Orpah Melter, MD [facility] Pager [phone]   If 7PM-7AM, please contact night-coverage [email or web address] Password [email or web address] 09/07/2015, 1:47 PM

## 2015-09-08 LAB — GLUCOSE, CAPILLARY
GLUCOSE-CAPILLARY: 101 mg/dL — AB (ref 65–99)
Glucose-Capillary: 110 mg/dL — ABNORMAL HIGH (ref 65–99)
Glucose-Capillary: 111 mg/dL — ABNORMAL HIGH (ref 65–99)
Glucose-Capillary: 95 mg/dL (ref 65–99)
Glucose-Capillary: 96 mg/dL (ref 65–99)

## 2015-09-08 LAB — BASIC METABOLIC PANEL
ANION GAP: 7 (ref 5–15)
BUN: 6 mg/dL (ref 6–20)
CHLORIDE: 104 mmol/L (ref 101–111)
CO2: 25 mmol/L (ref 22–32)
Calcium: 7.8 mg/dL — ABNORMAL LOW (ref 8.9–10.3)
Creatinine, Ser: 0.63 mg/dL (ref 0.61–1.24)
GFR calc Af Amer: >60 mL/min (ref >60)
GLUCOSE: 101 mg/dL — AB (ref 65–99)
POTASSIUM: 4.1 mmol/L (ref 3.5–5.1)
Sodium: 136 mmol/L (ref 135–145)

## 2015-09-08 LAB — CBC
HEMATOCRIT: 25.4 % — AB (ref 39.0–52.0)
HEMOGLOBIN: 8.2 g/dL — AB (ref 13.0–17.0)
MCH: 26.8 pg (ref 26.0–34.0)
MCHC: 32.3 g/dL (ref 30.0–36.0)
MCV: 83 fL (ref 78.0–100.0)
Platelets: 182 10*3/uL (ref 150–400)
RBC: 3.06 MIL/uL — ABNORMAL LOW (ref 4.22–5.81)
RDW: 24.6 % — ABNORMAL HIGH (ref 11.5–15.5)
WBC: 15.9 10*3/uL — ABNORMAL HIGH (ref 4.0–10.5)

## 2015-09-08 MED ORDER — RIFAXIMIN 550 MG PO TABS: 550.0000 mg | ORAL_TABLET | Freq: Three times a day (TID) | ORAL | Status: DC

## 2015-09-08 MED ORDER — FUROSEMIDE 40 MG PO TABS: 40.0000 mg | ORAL_TABLET | Freq: Every day | ORAL | Status: DC

## 2015-09-08 MED ORDER — SPIRONOLACTONE 50 MG PO TABS: 50.0000 mg | ORAL_TABLET | Freq: Every day | ORAL | Status: DC

## 2015-09-08 MED ORDER — LACTULOSE 10 GM/15ML PO SOLN: 30.0000 g | Freq: Three times a day (TID) | ORAL | Status: DC

## 2015-09-08 NOTE — Progress Notes (Signed)
Patient discharged to home, all discharge medications and instructions reviewed with patient and mother.  Patient to be assisted to vehicle by wheelchair.

## 2015-09-08 NOTE — Care Management Note (Signed)
Case Management Note  Patient Details  Name: [name]: [id number] Date of Birth: Oct 17, 1980  Subjective/Objective:      alcoholic cirrhosis with secondary ascites              Action/Plan: Discharge Planning:  AVS reviewed:  Medications reviewed and pt will be on XIFAXAN out of pocket cost 1441.00. Attending made aware, pt to take Lactulose. Pt states he is currently without insurance. He pays $175 to see his PCP. Spoke to mother, [name] and states they will be helping pt get his medication. Provided her with discount cards and goodrx coupon for Lasix, Spirolactone, and Lactulose. Provided patient assistance application [facility] for pt to take to PCP's office to complete. States she will contact Monday to make an appt with PCP's office.  Contacted [facility] DME rep for RW for home. RW delivered to room.   PCP- James HasAaron Morrow MD   Expected Discharge Date:  09/08/2015             Expected Discharge Plan:  Home/Self Care  In-House Referral:  NA  Discharge planning Services  CM Consult, Medication Assistance  Post Acute Care Choice:  NA Choice offered to:  NA  DME Arranged:  Walker rolling DME Agency:  [facility]  HH Arranged:  NA HH Agency:  NA  Status of Service:  Completed, signed off  Medicare Important Message Given:    Date Medicare IM Given:    Medicare IM give by:    Date Additional Medicare IM Given:    Additional Medicare Important Message give by:     If discussed at Long Length of Stay Meetings, dates discussed:    Additional Comments:  [name], [name] Ellen, RN 09/08/2015, 5:54 PM

## 2015-09-08 NOTE — Discharge Summary (Addendum)
Physician Discharge Summary  [name] [facility] ZOX:[id number] DOB: [birth year] DOA: 08/20/2015  PCP: No primary care provider on file.  Admit date: 08/20/2015 Discharge date: 09/10/2015  Time spent: 20 minutes  Recommendations for Outpatient Follow-up:  1. Follow up with PCP in 2-3 weeks   Discharge Diagnoses:  Principal Problem:   Acute blood loss anemia Active Problems:   Acute GI bleeding   ETOH abuse   Elevated INR   Hypoalbuminemia   Hypokalemia   Hypocalcemia   Hypomagnesemia   Acute hypoxemic respiratory failure (HCC)   Alcoholic cirrhosis of liver with ascites (HCC)   Encephalopathy acute   Acute upper GI bleed   Ascites   Distended abdomen   Hemorrhagic shock   Encounter for palliative care   Goals of care, counseling/discussion   Acute encephalopathy   Discharge Condition: Improved  Diet recommendation: Low sodium  Filed Weights   09/06/15 0449 09/07/15 0459 09/08/15 0455  Weight: 80.287 kg (177 lb) 77.565 kg (171 lb) 79.833 kg (176 lb)    History of present illness:  Please review dictated H and P from 4/24 for details. Briefly, Patient is a 35 year old male with past mental history of alcohol abuse admitted on 4/24 with hematemesis and hematochezia and was found to have anemia secondary to esophageal varices. Endoscopy was conducted by GI which required patient to be intubated for this procedure and remained that way due to hemorrhagic shock from acute blood loss anemia. At that point, critical care took over for patient. Once patient able to be extubated, hospitalists assumed care.  Hospital Course:  Acute blood loss anemia causing hemorrhagic shock: Shock resolved. BP since remained stable.  Continued ETOH abuse causing alcoholic cirrhosis with secondary ascites/transaminitis/hepatic encephalopathy/coagulopathy/variceal bleed/thrombocytopenia: Active drinker. Status post paracentesis with no evidence of SBP. On Lasix, propranolol, lactulose and rifaximin.  EGD was done 4/25 noted still bleeding varices, injected but not able to band. Continued PPI. Patient was given vitamin K for coagulopathy. Abd remained mildly distended. Therapeutic paracentesis was done on 5/9 yielding 3L ascites and again on 5/12 yielding 2.6L of fluid. Patient continued to improve.  Leukocytosis: Unclear etiology. Chest x-ray is vague, no true infiltrate noted. Given lack of fever and no hypoxia, not convinced about aspiration pneumonia. Nursing reported urinary retention, thus patient treated with empiric rocephin.  Electrolyte abnormalities including hypomagnesemia, hypokalemia, hypocalcemia, hypernatremia and hypophosphatemia: Replaced  Hypoalbuminemia  Acute hypoxemic respiratory failure [facility]): Requiring intubation this admission. Able to be extubated on 4/27.   Adrenal insufficiency: No longer on steroids, continue to monitor closely  FEN: - Pt had pulled out NG on 5/11. Pt is awake and oriented.  - Passed SLP eval. Transitioned to oral meds  Procedures:  4/25-4/27: Intubation  4/25: EGD: Grade 2 esophageal varices, incompletely eradicated. Injected  Consultations:  Gastroenterology  Critical care  Palliative care  Discharge Exam: Filed Vitals:   09/07/15 1316 09/07/15 2040 09/08/15 0455 09/08/15 1250  BP: 116/76  Pulse: 87 90 79 102  Temp: 97.9 F (36.6 C) 98.4 F (36.9 C) 98.3 F (36.8 C) 98.5 F (36.9 C)  TempSrc: Oral Oral Oral Oral  Resp: Height:      Weight:   79.833 kg (176 lb)   SpO2: 100% 100% 100% 100%    General: Awake, in nad Cardiovascular: regular, s1, s2 Respiratory: normal resp effort, no wheezing  Discharge Instructions     Medication List    TAKE these medications  furosemide 40 MG tablet  Commonly known as:  LASIX  Take 1 tablet (40 mg total) by mouth daily.     lactulose 10 GM/15ML solution  Commonly known as:  CHRONULAC  Take 45 mLs (30 g total) by mouth 3 (three)  times daily.     omeprazole 20 MG capsule  Commonly known as:  PRILOSEC  Take 20 mg by mouth daily.     rifaximin 550 MG Tabs tablet  Commonly known as:  XIFAXAN  Take 1 tablet (550 mg total) by mouth 3 (three) times daily.     spironolactone 50 MG tablet  Commonly known as:  ALDACTONE  Take 1 tablet (50 mg total) by mouth daily.       Allergies  Allergen Reactions  . Soy Allergy Other (See Comments)    Swelling.    Follow-up Information    Schedule an appointment as soon as possible for a visit with Follow up with PCP in 2-3 weeks.   Why:  Hospital follow up       The results of significant diagnostics from this hospitalization (including imaging, microbiology, ancillary and laboratory) are listed below for reference.    Significant Diagnostic Studies: Dg Chest 1 View  09/04/2015  CLINICAL DATA:  Short of breath.  Weakness. EXAM: CHEST 1 VIEW COMPARISON:  08/22/2015 FINDINGS: There are low lung volumes. This accentuates the bronchovascular markings. Additional left mid and bilateral lung base opacity noted most likely atelectasis. Mild hazy airspace opacities noted in the perihilar regions. Consider mild pulmonary edema in the proper clinical setting. No pleural effusion or pneumothorax. The cardiac silhouette is top-normal in size. Normal mediastinal and hilar contours. Right internal jugular central venous line is stable with its tip projecting in the right atrium. Enteric tube passes below the diaphragm well into the stomach and below the included field of view. IMPRESSION: 1. Exam somewhat limited by the semi-erect portable technique and low lung volumes. 2. Mild hazy perihilar opacity may be due to a combination of atelectasis and bronchovascular crowding. Consider mild pulmonary edema in the proper clinical setting. Electronically Signed   By: Amie Portland M.D.   On: 09/04/2015 08:27   US Abdomen Port  08/24/2015  CLINICAL DATA:  Abdominal distention. Elevated liver function  tests. Alcohol abuse. EXAM: ABDOMEN ULTRASOUND COMPLETE COMPARISON:  None. FINDINGS: Gallbladder: Diffuse wall thickening measuring up to 5.7 mm. Minimal pericholecystic fluid. No visible gallstones. The patient was not focally tender over the gallbladder. Common bile duct: Diameter: 4.1 mm Liver: Diffusely echogenic with lobular contours. No visible blood flow in the main portal vein. IVC: No abnormality visualized. Pancreas: Visualized portion unremarkable. Area of focal fluid accumulation adjacent to the pancreatic head, measuring 3.3 x 2.8 x 2.5 cm. Spleen: Size and appearance within normal limits. Right Kidney: Length: 10.5 cm. Echogenicity within normal limits. No mass or hydronephrosis visualized. Left Kidney: Length: 11.2 cm. Echogenicity within normal limits. No mass or hydronephrosis visualized. Abdominal aorta: No aneurysm visualized. Other findings: Moderate amount of ascites.  Left pleural effusion. IMPRESSION: 1. Findings compatible with cirrhosis of the liver. 2. No visible blood flow in the main portal vein. This could be due to portal vein occlusion or difficulty penetrating the liver for flow detection. 3. Moderate amount of ascites. 4. Probable focal ascites adjacent to the head of the pancreas. A pancreatic pseudocyst is also a possibility. 5. Diffuse gallbladder wall thickening with minimal pericholecystic fluid. This is most likely due to hypoproteinemia and ascites. Chronic or acute acalculous  cholecystitis could also have this appearance. 6. Left pleural effusion. Electronically Signed   By: Beckie SaltsSteven  Reid M.D.   On: 08/24/2015 14:46   [place] Paracentesis  09/07/2015  INDICATION: Cirrhosis, recurrent ascites. Request made for therapeutic paracentesis. EXAM: ULTRASOUND GUIDED THERAPEUTIC PARACENTESIS MEDICATIONS: None. COMPLICATIONS: None immediate. PROCEDURE: Informed written consent was obtained from the patient after a discussion of the risks, benefits and alternatives to treatment. A timeout  was performed prior to the initiation of the procedure. Initial ultrasound scanning demonstrates a moderate amount of ascites within the right lower abdominal quadrant. The right lower abdomen was prepped and draped in the usual sterile fashion. 1% lidocaine was used for local anesthesia. Following this, a Yueh catheter was introduced. An ultrasound image was saved for documentation purposes. The paracentesis was performed. The catheter was removed and a dressing was applied. The patient tolerated the procedure well without immediate post procedural complication. FINDINGS: A total of approximately 2.6 liters of yellow fluid was removed. IMPRESSION: Successful ultrasound-guided therapeutic paracentesis yielding 2.6 liters of peritoneal fluid. Read by: Jeananne RamaKevin Allred, PA-C Electronically Signed   By: Irish LackGlenn  Yamagata M.D.   On: 09/07/2015 16:02   [place] Paracentesis  08/29/2015  INDICATION: 35 year old male admitted with biliary am and cirrhosis secondary to alcohol abuse. A request has been made for a diagnostic and therapeutic paracentesis. EXAM: ULTRASOUND GUIDED DIAGNOSTIC AND THERAPEUTIC PARACENTESIS MEDICATIONS: 1% lidocaine COMPLICATIONS: None immediate. PROCEDURE: Informed written consent was obtained from the patient's parents after a discussion of the risks, benefits and alternatives to treatment. A timeout was performed prior to the initiation of the procedure. Initial ultrasound scanning demonstrates a small amount of ascites within the right upper abdominal quadrant. The right upper abdomen was prepped and draped in the usual sterile fashion. 1% lidocaine was used for local anesthesia. Following this, a 19 gauge, 7-cm, Yueh catheter was introduced under direct ultrasound guidance. An ultrasound image was saved for documentation purposes. The paracentesis was performed. The catheter was removed and a dressing was applied. The patient tolerated the procedure well without immediate post procedural complication.  FINDINGS: A total of approximately 1 L of clear yellow fluid was removed. Samples were sent to the laboratory as requested by the clinical team. IMPRESSION: Successful ultrasound-guided paracentesis yielding 1 liter of peritoneal fluid. Read by: Barnetta ChapelKelly Osborne, PA-C Electronically Signed   By: Malachy MoanHeath  McCullough M.D.   On: 08/29/2015 15:24   Dg Chest Port 1 View  08/22/2015  CLINICAL DATA:  Respiratory failure. EXAM: PORTABLE CHEST 1 VIEW COMPARISON:  08/21/2015. FINDINGS: Endotracheal tube and right IJ line stable position. Heart size stable. Low lung volumes with mild bibasilar atelectasis. No pleural effusion or pneumothorax. IMPRESSION: 1. Endotracheal tube and right IJ line stable position. 2.  Bibasilar subsegmental atelectasis. Electronically Signed   By: Maisie Fushomas  Register   On: 08/22/2015 07:21   Dg Chest Port 1 View  08/21/2015  CLINICAL DATA:  Respiratory failure. EXAM: PORTABLE CHEST 1 VIEW COMPARISON:  None. FINDINGS: The heart size and mediastinal contours are within normal limits. Both lungs are clear. No pneumothorax or pleural effusion is noted. Endotracheal tube is noted with over tracheal air shadow with distal tip 1.3 cm above the carina. Right internal jugular catheter is noted with distal tip in expected position of cavoatrial junction. The visualized skeletal structures are unremarkable. IMPRESSION: Endotracheal tube and right internal jugular catheter in grossly good position. No acute cardiopulmonary abnormality seen. Electronically Signed   By: Lupita RaiderJames  Green Jr, M.D.   On: 08/21/2015 08:23  Dg Abd Portable 1v  09/03/2015  CLINICAL DATA:  Feeding tube placement EXAM: PORTABLE ABDOMEN - 1 VIEW COMPARISON:  None. FINDINGS: Feeding tube tip is in the proximal stomach. Bowel gas pattern is normal. No free air evident. Lung bases clear. IMPRESSION: Feeding tube tip in body of stomach. Bowel gas pattern unremarkable. Electronically Signed   By: Bretta Bang III M.D.   On: 09/03/2015 12:21    Dg Abd Portable 1v  08/29/2015  CLINICAL DATA:  evaluate NG tube placement EXAM: PORTABLE ABDOMEN - 1 VIEW COMPARISON:  08/29/2015 FINDINGS: The feeding tube tip is in the projection of the body as stomach. Cough mild gaseous distension of the small bowel and large bowel loops. IMPRESSION: The feeding tube tip is in the body of the stomach. Electronically Signed   By: Signa Kell M.D.   On: 08/29/2015 07:06   Dg Abd Portable 1v  08/29/2015  CLINICAL DATA:  NG tube placement. EXAM: PORTABLE ABDOMEN - 1 VIEW COMPARISON:  08/28/2015 FINDINGS: Enteric tube tip is in the left upper quadrant consistent with location in the body of the stomach. IMPRESSION: Enteric tube tip in the left upper quadrant consistent with location in the body of the stomach. Electronically Signed   By: Burman Nieves M.D.   On: 08/29/2015 03:12   Dg Abd Portable 1v  08/28/2015  CLINICAL DATA:  Tube placement EXAM: PORTABLE ABDOMEN - 1 VIEW COMPARISON:  None. FINDINGS: An enteric tube extends below the diaphragm with tip just reaching the expected location of the gastric lumen. IMPRESSION: Enteric tube tip is probably within the lumen at the gastric cardia, but the tube does not extend far into the stomach. Electronically Signed   By: Ellery Plunk M.D.   On: 08/28/2015 22:46   Dg Abd Portable 1v  08/23/2015  CLINICAL DATA:  Ileus EXAM: PORTABLE ABDOMEN - 1 VIEW COMPARISON:  CT abdomen pelvis dated 09/18/2011 FINDINGS: Nonobstructive bowel gas pattern. Visualized osseous structures are within normal limits. IMPRESSION: Unremarkable abdominal radiograph. Electronically Signed   By: Charline Bills M.D.   On: 08/23/2015 09:35    Microbiology: No results found for this or any previous visit (from the past 240 hour(s)).   Labs: Basic Metabolic Panel:  Recent Labs Lab 09/04/15 0630 09/05/15 0500 09/06/15 0500 09/08/15 0515  NA 146* 141 137 136  K 4.4 4.6 4.6 4.1  CL 114* 112* 105 104  CO2 27 25 24 25   GLUCOSE  130* 129* 127* 101*  BUN 15 12 9 6   CREATININE 0.72 0.73 0.61 0.63  CALCIUM 7.7* 7.6* 7.7* 7.8*   Liver Function Tests:  Recent Labs Lab 09/04/15 0630 09/05/15 0500 09/06/15 0500  AST 91* 81* 72*  ALT 57 48 39  ALKPHOS 81 77 74  BILITOT 6.3* 5.4* 5.7*  PROT 6.2* 5.9* 5.8*  ALBUMIN 1.8* 1.6* 1.7*   No results for input(s): LIPASE, AMYLASE in the last 168 hours.  Recent Labs Lab 09/05/15 1050  AMMONIA 57*   CBC:  Recent Labs Lab 09/04/15 0630 09/05/15 0500 09/06/15 0500 09/08/15 0515  WBC 17.1* 15.2* 15.7* 15.9*  HGB 7.5* 6.9* 8.2* 8.2*  HCT 23.7* 22.4* 25.5* 25.4*  MCV 82.9 82.4 82.0 83.0  PLT 126* 105* 127* 182   Cardiac Enzymes: No results for input(s): CKTOTAL, CKMB, CKMBINDEX, TROPONINI in the last 168 hours. BNP: BNP (last 3 results) No results for input(s): BNP in the last 8760 hours.  ProBNP (last 3 results) No results for input(s): PROBNP in the last  8760 hours.  CBG:  Recent Labs Lab 09/08/15 0015 09/08/15 0457 09/08/15 0729 09/08/15 1127 09/08/15 1549  GLUCAP 95 101* 96 111* 110*     Signed:  Momina Hunton K  [facility] 09/10/2015, 1:49 PM

## 2015-09-08 NOTE — Evaluation (Addendum)
Occupational Therapy Evaluation Patient Details Name: [name]: [id number] DOB: [birth year] Today's Date: 09/08/2015    History of Present Illness Patient is a [age] year old male with past mental history of alcohol abuse admitted on 4/24 with hematemesis and hematochezia and was found to have anemia secondary to esophageal varices. Endoscopy was conducted by GI which required patient to be intubated and extubated 08/23/15.   Clinical Impression   Pt was admitted for the above.  Pt is not a good historian and unsure of baseline.  He needs up to max A for adls and min A for SPT at this time.  He will benefit from continued OT to increase safety and independence with adls.  Goals are for min guard to min A level in acute    Follow Up Recommendations  SNF;Supervision/Assistance - 24 hour (HHOT if home)    Equipment Recommendations  3 in 1 bedside comode (likely)    Recommendations for Other Services       Precautions / Restrictions Precautions Precautions: Fall Precaution Comments: scrotal edema Restrictions Weight Bearing Restrictions: No      Mobility Bed Mobility         Supine to sit: Min guard     General bed mobility comments: extra time  Transfers   Equipment used: Rolling walker (2 wheeled) Transfers: Sit to/from [employer] CorporationStand;Stand Pivot Transfers Sit to Stand: Min assist Stand pivot transfers: Min assist       General transfer comment: cues for safety.  Assist to rise and stabilize    Balance   Sitting-balance support: Feet supported Sitting balance-Leahy Scale: Fair     Standing balance support: Bilateral upper extremity supported Standing balance-Leahy Scale: Poor                              ADL Overall ADL's : Needs assistance/impaired Eating/Feeding: Independent   Grooming: Set up;Wash/dry hands;Wash/dry face;Sitting   Upper Body Bathing: Set up;Sitting   Lower Body Bathing: Moderate assistance;Sit to/from stand   Upper  Body Dressing : Minimal assistance;Sitting   Lower Body Dressing: Maximal assistance;Sit to/from stand   Toilet Transfer: Minimal assistance;Stand-pivot;RW (chair)             General ADL Comments: completed ADL from EOB.  Pt uncomfortable when trying to lean over to reach below knees.  Swelling in bil feet. During eval, pt needed guarding for central line and also around ostomy bag as he was trying to remove it. Pt unsteady with SPT to chair     Vision     Perception     Praxis      Pertinent Vitals/Pain Pain Assessment: Faces Faces Pain Scale: Hurts little more Pain Location: Scrotum Pain Intervention(s): Limited activity within patient's tolerance;Monitored during session;Repositioned     Hand Dominance     Extremity/Trunk Assessment Upper Extremity Assessment Upper Extremity Assessment: Overall WFL for tasks assessed           Communication Communication Communication: No difficulties   Cognition Arousal/Alertness: Awake/alert Behavior During Therapy: WFL for tasks assessed/performed Overall Cognitive Status: Impaired/Different from baseline Area of Impairment: Orientation;Safety/judgement;Memory Orientation Level: Time             General Comments: pt was confabulating during evaluation.  Multiple safety cues.   General Comments       Exercises       Shoulder Instructions      Home Living  Additional Comments: uncertain.  Pt confabulating during evaluation      Prior Functioning/Environment          Comments: unknown    OT Diagnosis: Generalized weakness;Acute pain   OT Problem List: Decreased strength;Decreased activity tolerance;Impaired balance (sitting and/or standing);Decreased cognition;Decreased safety awareness;Decreased knowledge of use of DME or AE;Pain   OT Treatment/Interventions: Self-care/ADL training;DME and/or AE instruction;Visual/perceptual  remediation/compensation;Balance training;Patient/family education;Therapeutic activities    OT Goals(Current goals can be found in the care plan section) Acute Rehab OT Goals Patient Stated Goal: walk OT Goal Formulation: With patient Time For Goal Achievement: 09/22/15 Potential to Achieve Goals: Fair ADL Goals Pt Will Perform Grooming: with min guard assist;standing Pt Will Perform Lower Body Bathing: with min guard assist;with adaptive equipment;sit to/from stand Pt Will Perform Lower Body Dressing: with min assist;with adaptive equipment;sit to/from stand (pants only with reacher) Pt Will Transfer to Toilet: with min guard assist;ambulating;bedside commode (vs standard commode) Additional ADL Goal #1: pt will complete ADL and toilet transfer with only one cue for safety per activity  OT Frequency: Min 2X/week   Barriers to D/C:            Co-evaluation              End of Session Nurse Communication: Mobility status  Activity Tolerance: Patient tolerated treatment well Patient left: in chair;with call bell/phone within reach;with chair alarm set   Time: 1914-7829 OT Time Calculation (min): 28 min Charges:  OT General Charges $OT Visit: 1 Procedure OT Evaluation $OT Eval Moderate Complexity: 1 Procedure OT Treatments $Self Care/Home Management : 8-22 mins G-Codes:    Dyna Figuereo 09/18/15, 10:42 AM  Marica Otter, OTR/L [phone] 2015-09-18

## 2015-09-08 NOTE — Progress Notes (Signed)
CSW met with pt / mother today to assist with d/c planning. Pt lives with parents and will return to their home at d/c. [facility] services will be needed at d/c. Mother will assist pt with medicaid application process. Message left for [facility] to provide medicaid application on Monday. RNCM to assist with d/c planning needs.  Werner Lean LCSW [phone]

## 2015-09-11 ENCOUNTER — Other Ambulatory Visit (HOSPITAL_COMMUNITY): Payer: Self-pay | Admitting: Family Medicine

## 2015-09-11 ENCOUNTER — Ambulatory Visit (HOSPITAL_COMMUNITY)
Admission: RE | Admit: 2015-09-11 | Discharge: 2015-09-11 | Disposition: A | Discharge status: Home / Self Care | Payer: Medicaid Other | Source: Ambulatory Visit | Attending: Family Medicine | Admitting: Family Medicine

## 2015-09-11 DIAGNOSIS — R188 Other ascites: Secondary | ICD-10-CM | POA: Insufficient documentation

## 2015-09-11 NOTE — Procedures (Signed)
Ultrasound-guided therapeutic paracentesis performed yielding 2.9 liters of clear yellow colored fluid. No immediate complications.  [name] E 2:59 PM 09/11/2015

## 2015-09-17 ENCOUNTER — Other Ambulatory Visit (HOSPITAL_COMMUNITY): Payer: Self-pay | Admitting: Family Medicine

## 2015-09-17 DIAGNOSIS — R188 Other ascites: Secondary | ICD-10-CM

## 2015-09-18 ENCOUNTER — Ambulatory Visit (HOSPITAL_COMMUNITY)
Admission: RE | Admit: 2015-09-18 | Discharge: 2015-09-18 | Disposition: A | Discharge status: Home / Self Care | Payer: Medicaid Other | Source: Ambulatory Visit | Attending: Family Medicine | Admitting: Family Medicine

## 2015-09-18 DIAGNOSIS — K7460 Unspecified cirrhosis of liver: Secondary | ICD-10-CM | POA: Diagnosis not present

## 2015-09-18 DIAGNOSIS — R188 Other ascites: Secondary | ICD-10-CM | POA: Diagnosis not present

## 2015-09-18 MED ORDER — LIDOCAINE HCL (PF) 1 % IJ SOLN
INTRAMUSCULAR | Status: DC
Start: 2015-09-18 — End: 2015-09-19
  Filled 2015-09-18: qty 10

## 2015-09-18 NOTE — Procedures (Signed)
Ultrasound-guided  therapeutic paracentesis performed yielding 2.4 liters of yellow  fluid. No immediate complications.  

## 2016-07-24 DIAGNOSIS — K7030 Alcoholic cirrhosis of liver without ascites: Secondary | ICD-10-CM | POA: Insufficient documentation

## 2016-08-25 ENCOUNTER — Other Ambulatory Visit: Payer: Self-pay | Admitting: Gastroenterology

## 2016-08-25 DIAGNOSIS — K7030 Alcoholic cirrhosis of liver without ascites: Secondary | ICD-10-CM

## 2016-09-03 ENCOUNTER — Ambulatory Visit
Admission: RE | Admit: 2016-09-03 | Discharge: 2016-09-03 | Disposition: A | Discharge status: Home / Self Care | Payer: BLUE CROSS/BLUE SHIELD | Source: Ambulatory Visit | Attending: Gastroenterology | Admitting: Gastroenterology

## 2016-09-03 DIAGNOSIS — K7030 Alcoholic cirrhosis of liver without ascites: Secondary | ICD-10-CM

## 2017-04-09 ENCOUNTER — Other Ambulatory Visit: Payer: Self-pay | Admitting: Gastroenterology

## 2017-04-09 DIAGNOSIS — K7030 Alcoholic cirrhosis of liver without ascites: Secondary | ICD-10-CM

## 2017-04-15 ENCOUNTER — Ambulatory Visit
Admission: RE | Admit: 2017-04-15 | Discharge: 2017-04-15 | Disposition: A | Discharge status: Home / Self Care | Payer: BLUE CROSS/BLUE SHIELD | Source: Ambulatory Visit | Attending: Gastroenterology | Admitting: Gastroenterology

## 2017-04-15 DIAGNOSIS — K7030 Alcoholic cirrhosis of liver without ascites: Secondary | ICD-10-CM

## 2017-10-12 ENCOUNTER — Other Ambulatory Visit: Payer: Self-pay | Admitting: Gastroenterology

## 2017-10-12 DIAGNOSIS — K7030 Alcoholic cirrhosis of liver without ascites: Secondary | ICD-10-CM

## 2017-10-22 ENCOUNTER — Ambulatory Visit
Admission: RE | Admit: 2017-10-22 | Discharge: 2017-10-22 | Disposition: A | Discharge status: Home / Self Care | Payer: BLUE CROSS/BLUE SHIELD | Source: Ambulatory Visit | Attending: Gastroenterology | Admitting: Gastroenterology

## 2017-10-22 DIAGNOSIS — K7030 Alcoholic cirrhosis of liver without ascites: Secondary | ICD-10-CM

## 2018-01-04 IMAGING — DX DG CHEST 1V PORT
1 series · 1 of 1 positions shown · non-contrast
Comparison: None.

CLINICAL DATA: Respiratory failure.

EXAM:
PORTABLE CHEST 1 VIEW

[chest ap]
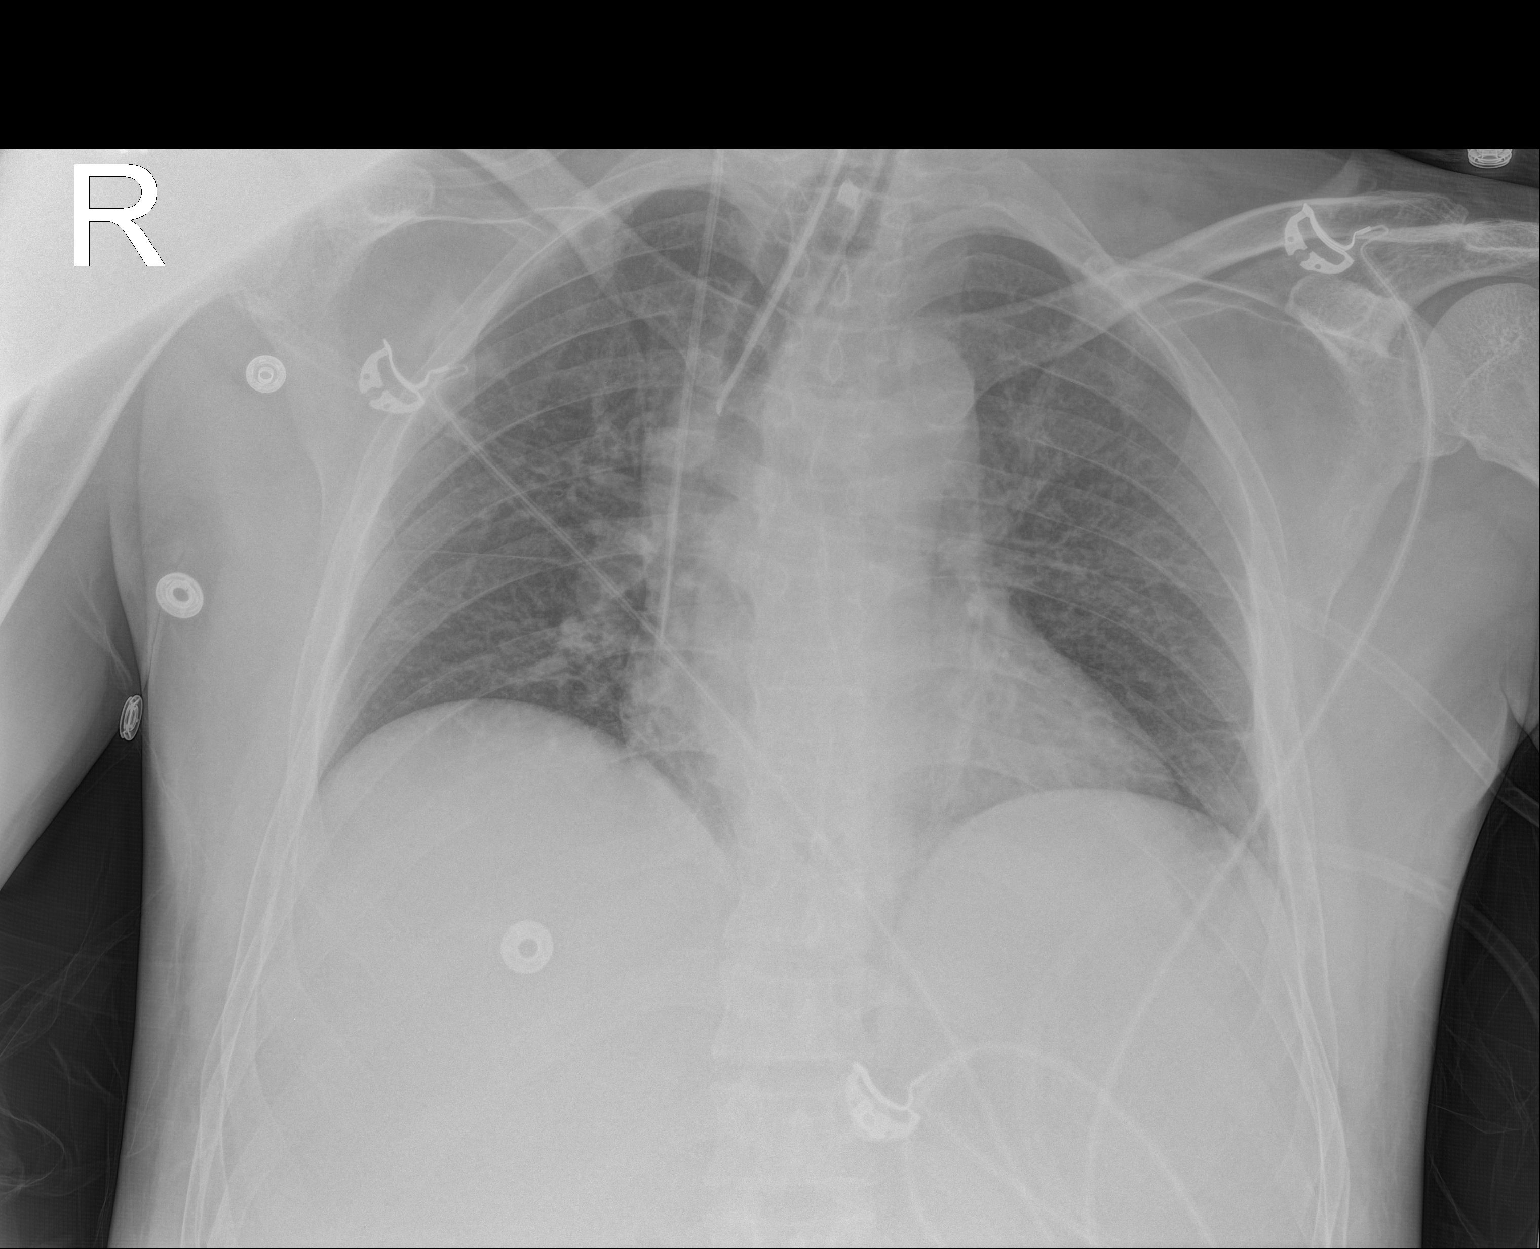

[1 of 1 positions shown; findings below may reference images not displayed]

FINDINGS: The heart size and mediastinal contours are within normal limits.
Both lungs are clear. No pneumothorax or pleural effusion is noted.
Endotracheal tube is noted with over tracheal air shadow with distal
tip 1.3 cm above the carina. Right internal jugular catheter is
noted with distal tip in expected position of cavoatrial junction.
The visualized skeletal structures are unremarkable.
IMPRESSION: Endotracheal tube and right internal jugular catheter in grossly
good position. No acute cardiopulmonary abnormality seen.

## 2018-05-26 ENCOUNTER — Emergency Department (HOSPITAL_COMMUNITY): Payer: BLUE CROSS/BLUE SHIELD

## 2018-05-26 ENCOUNTER — Other Ambulatory Visit: Payer: Self-pay

## 2018-05-26 ENCOUNTER — Emergency Department (HOSPITAL_COMMUNITY)
Admission: EM | Admit: 2018-05-26 | Discharge: 2018-05-26 | Disposition: A | Discharge status: Home / Self Care | Payer: BLUE CROSS/BLUE SHIELD | Attending: Emergency Medicine | Admitting: Emergency Medicine

## 2018-05-26 ENCOUNTER — Encounter (HOSPITAL_COMMUNITY): Payer: Self-pay

## 2018-05-26 DIAGNOSIS — Z87891 Personal history of nicotine dependence: Secondary | ICD-10-CM | POA: Insufficient documentation

## 2018-05-26 DIAGNOSIS — I300 Acute nonspecific idiopathic pericarditis: Secondary | ICD-10-CM | POA: Diagnosis not present

## 2018-05-26 DIAGNOSIS — R079 Chest pain, unspecified: Secondary | ICD-10-CM | POA: Diagnosis present

## 2018-05-26 DIAGNOSIS — Z79899 Other long term (current) drug therapy: Secondary | ICD-10-CM | POA: Insufficient documentation

## 2018-05-26 HISTORY — DX: Esophageal varices with bleeding: I85.01

## 2018-05-26 HISTORY — DX: Unspecified cirrhosis of liver: K74.60

## 2018-05-26 LAB — CBC
HCT: 42.6 % (ref 39.0–52.0)
Hemoglobin: 14.4 g/dL (ref 13.0–17.0)
MCH: 32.1 pg (ref 26.0–34.0)
MCHC: 33.8 g/dL (ref 30.0–36.0)
MCV: 95.1 fL (ref 80.0–100.0)
NRBC: 0 % (ref 0.0–0.2)
PLATELETS: 142 10*3/uL — AB (ref 150–400)
RBC: 4.48 MIL/uL (ref 4.22–5.81)
RDW: 11.9 % (ref 11.5–15.5)
WBC: 11.2 10*3/uL — ABNORMAL HIGH (ref 4.0–10.5)

## 2018-05-26 LAB — BASIC METABOLIC PANEL
ANION GAP: 10 (ref 5–15)
BUN: 5 mg/dL — ABNORMAL LOW (ref 6–20)
CALCIUM: 9 mg/dL (ref 8.9–10.3)
CO2: 30 mmol/L (ref 22–32)
Chloride: 97 mmol/L — ABNORMAL LOW (ref 98–111)
Creatinine, Ser: 1.04 mg/dL (ref 0.61–1.24)
GFR calc Af Amer: >60 mL/min (ref >60)
GLUCOSE: 143 mg/dL — AB (ref 70–99)
Potassium: 3.4 mmol/L — ABNORMAL LOW (ref 3.5–5.1)
SODIUM: 137 mmol/L (ref 135–145)

## 2018-05-26 LAB — RAPID URINE DRUG SCREEN, HOSP PERFORMED
Amphetamines: NOT DETECTED
BARBITURATES: NOT DETECTED
Benzodiazepines: POSITIVE — AB
COCAINE: NOT DETECTED
Opiates: NOT DETECTED
Tetrahydrocannabinol: POSITIVE — AB

## 2018-05-26 LAB — I-STAT TROPONIN, ED: TROPONIN I, POC: 0 ng/mL (ref 0.00–0.08)

## 2018-05-26 LAB — HEPATIC FUNCTION PANEL
ALT: 14 U/L (ref 0–44)
AST: 32 U/L (ref 15–41)
Albumin: 3.4 g/dL — ABNORMAL LOW (ref 3.5–5.0)
Alkaline Phosphatase: 50 U/L (ref 38–126)
Bilirubin, Direct: 0.4 mg/dL — ABNORMAL HIGH (ref 0.0–0.2)
Indirect Bilirubin: 1.1 mg/dL — ABNORMAL HIGH (ref 0.3–0.9)
TOTAL PROTEIN: 7.2 g/dL (ref 6.5–8.1)
Total Bilirubin: 1.5 mg/dL — ABNORMAL HIGH (ref 0.3–1.2)

## 2018-05-26 LAB — D-DIMER, QUANTITATIVE: D-Dimer, Quant: 0.49 ug/mL-FEU (ref 0.00–0.50)

## 2018-05-26 LAB — LIPASE, BLOOD: Lipase: 31 U/L (ref 11–51)

## 2018-05-26 MED ORDER — POTASSIUM CHLORIDE CRYS ER 20 MEQ PO TBCR
40.0000 meq | EXTENDED_RELEASE_TABLET | Freq: Once | ORAL | Status: AC
  Administered 2018-05-26: 40 meq via ORAL
  Filled 2018-05-26: qty 2

## 2018-05-26 MED ORDER — PREDNISONE 20 MG PO TABS: ORAL_TABLET | ORAL | 0 refills | Status: AC

## 2018-05-26 MED ORDER — SODIUM CHLORIDE 0.9% FLUSH: 3.0000 mL | Freq: Once | INTRAVENOUS | Status: DC

## 2018-05-26 NOTE — Discharge Instructions (Addendum)
You are starting on a steroid taper course please as prescribed it will help with the pain and the inflammation that we discussed. Follow up with your primary care provider.

## 2018-05-26 NOTE — ED Notes (Signed)
Patient transported to X-ray 

## 2018-05-26 NOTE — ED Triage Notes (Signed)
Per [facility]: Pt started with chest pain that woke him up at 1300 today. Stated he thought that it was a gas bubble. Pt has hx of GERD but states that this does not feel like that. 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 Pt rates pain 9/10 and does grimace in pain. States pain is sharp and stabbing. States that it sometimes goes away when he takes deep breaths.

## 2018-05-26 NOTE — ED Provider Notes (Signed)
[facility] EMERGENCY DEPARTMENT Provider Note   CSN: [id number] Arrival date & time: 05/26/18  1523     History   Chief Complaint Chief Complaint  Patient presents with  . Chest Pain    HPI LARNELL GRANLUND is a 38 y.o. male with a past medical history significant for liver cirrhosis, hx of alcohol abuse, GERD and esophageal varices who present today with chest pain. Patient reports pain started this morning around 10:00 am. He describes the pain as sharp in the middle of his chest radiating to his left shoulder. Pain is not worse with exertion. Patient denies any dizziness, diaphoresis, shortness of breath, nausea or vomiting. Patient did not take any medication for his pain. He denies any prior similar presentation. Patient denies any recent alcohol or drug use. Patient denies any history of cardiac disease. Patient denies any recent illness, fever or cough. EMS was called by patient's mother after he told her about his chest pain around 2:20 pm. Patient received 324 mg aspirin in route to the ED (4x 81 mg). Pain is currently improved.   HPI  Past Medical History:  Diagnosis Date  . Cirrhosis of liver (HCC)   . Esophageal varices with hemorrhage (HCC)   . ETOH abuse   . GERD (gastroesophageal reflux disease)     Patient Active Problem List   Diagnosis Date Noted  . Acute encephalopathy   . Acute upper GI bleed   . Ascites   . Distended abdomen   . Hemorrhagic shock (HCC)   . Encounter for palliative care   . Goals of care, counseling/discussion   . Encephalopathy acute 08/27/2015  . Alcoholic cirrhosis of liver with ascites (HCC)   . Acute hypoxemic respiratory failure (HCC)   . SBP (spontaneous bacterial peritonitis) (HCC)   . Hypocalcemia 08/21/2015  . Hypomagnesemia 08/21/2015  . Acute blood loss anemia 08/20/2015  . Acute GI bleeding 08/20/2015  . ETOH abuse 08/20/2015  . Elevated INR 08/20/2015  . Hypoalbuminemia 08/20/2015  . Hypokalemia  08/20/2015    Past Surgical History:  Procedure Laterality Date  . ESOPHAGOGASTRODUODENOSCOPY Left 08/21/2015   Procedure: ESOPHAGOGASTRODUODENOSCOPY (EGD);  Surgeon: Charlott Rakes, MD;  Location: [facility];  Service: Endoscopy;  Laterality: Left;        Home Medications    Prior to Admission medications   Medication Sig Start Date End Date Taking? Authorizing Provider  furosemide (LASIX) 40 MG tablet Take 1 tablet (40 mg total) by mouth daily. 09/08/15  Yes Jerald Kief, MD  IRON PO Take 1 tablet by mouth daily.   Yes [provider]  lactulose (CHRONULAC) 10 GM/15ML solution Take 45 mLs (30 g total) by mouth 3 (three) times daily. 09/08/15  Yes Jerald Kief, MD  nadolol (CORGARD) 20 MG tablet Take 20 mg by mouth daily. 05/23/18  Yes [provider]  omeprazole (PRILOSEC) 20 MG capsule Take 20 mg by mouth daily.   Yes [provider]  potassium chloride SA (K-DUR,KLOR-CON) 20 MEQ tablet Take 20 mEq by mouth 2 (two) times daily. 05/26/18  Yes [provider]  rifaximin (XIFAXAN) 550 MG TABS tablet Take 1 tablet (550 mg total) by mouth 3 (three) times daily. Patient taking differently: Take 550 mg by mouth 2 (two) times daily.  09/08/15  Yes Jerald Kief, MD  sertraline (ZOLOFT) 100 MG tablet Take 200 mg by mouth daily. 05/18/18  Yes [provider]  predniSONE (DELTASONE) 20 MG tablet Take 1.5 tablets (30 mg  total) by mouth daily for 7 days, THEN 1 tablet (20 mg total) daily for 3 days, THEN 0.5 tablets (10 mg total) daily for 3 days. 05/26/18 06/08/18  Puneet Selden, Lilia ArgueAbdoulaye, MD  spironolactone (ALDACTONE) 50 MG tablet Take 1 tablet (50 mg total) by mouth daily. Patient not taking: Reported on 05/26/2018 09/08/15   Jerald Kiefhiu, Stephen K, MD    Family History Family History  Problem Relation Age of Onset  . Peptic Ulcer Paternal Aunt   . Crohn's disease Maternal Uncle   . Heart attack Maternal Uncle   . Heart attack Maternal Grandfather   .  Stroke Maternal Grandmother   . Stroke Paternal Grandfather     Social History Social History   Tobacco Use  . Smoking status: Former Smoker    Packs/day: 1.00    Types: Cigarettes  . Smokeless tobacco: Never Used  Substance Use Topics  . Alcohol use: Not Currently    Comment: 2 [place] loco per day  . Drug use: Yes    Types: Marijuana     Allergies   Penicillins and Soy allergy   Review of Systems Review of Systems  Constitutional: Positive for fatigue.  HENT: Negative.   Eyes: Negative.   Respiratory: Negative.   Cardiovascular: Positive for chest pain.  Gastrointestinal: Negative.   Genitourinary: Negative.   Musculoskeletal: Negative.   Skin: Negative.   Neurological: Negative.   Hematological: Negative.     Physical Exam Updated Vital Signs BP 105/70   Pulse 89   Temp 98.6 F (37 C) (Oral)   Resp 20   SpO2 98%   Physical Exam Constitutional:      Appearance: He is well-developed and normal weight.  HENT:     Head: Normocephalic and atraumatic.  Eyes:     Extraocular Movements: Extraocular movements intact.     Pupils: Pupils are equal, round, and reactive to light.  Neck:     Musculoskeletal: Normal range of motion and neck supple.  Cardiovascular:     Rate and Rhythm: Normal rate and regular rhythm.     Pulses:          Carotid pulses are 2+ on the right side and 2+ on the left side.      Radial pulses are 2+ on the right side and 2+ on the left side.       Dorsalis pedis pulses are 2+ on the right side and 2+ on the left side.       Posterior tibial pulses are 2+ on the right side and 2+ on the left side.     Heart sounds: Murmur present. Systolic murmur present with a grade of 3/6.  Pulmonary:     Effort: Pulmonary effort is normal.     Breath sounds: Examination of the right-lower field reveals decreased breath sounds. Examination of the left-lower field reveals decreased breath sounds. Decreased breath sounds present.  Abdominal:      General: Bowel sounds are normal.     Palpations: Abdomen is soft.  Musculoskeletal: Normal range of motion.  Skin:    General: Skin is warm and dry.     Capillary Refill: Capillary refill takes less than 2 seconds.  Neurological:     General: No focal deficit present.     Mental Status: He is alert and oriented to person, place, and time.  Psychiatric:        Mood and Affect: Mood normal.     ED Treatments / Results  Labs (all labs ordered  are listed, but only abnormal results are displayed) Labs Reviewed  BASIC METABOLIC PANEL - Abnormal; Notable for the following components:      Result Value   Potassium 3.4 (*)    Chloride 97 (*)    Glucose, Bld 143 (*)    BUN 5 (*)    All other components within normal limits  CBC - Abnormal; Notable for the following components:   WBC 11.2 (*)    Platelets 142 (*)    All other components within normal limits  RAPID URINE DRUG SCREEN, HOSP PERFORMED - Abnormal; Notable for the following components:   Benzodiazepines POSITIVE (*)    Tetrahydrocannabinol POSITIVE (*)    All other components within normal limits  HEPATIC FUNCTION PANEL - Abnormal; Notable for the following components:   Albumin 3.4 (*)    Total Bilirubin 1.5 (*)    Bilirubin, Direct 0.4 (*)    Indirect Bilirubin 1.1 (*)    All other components within normal limits  LIPASE, BLOOD  D-DIMER, QUANTITATIVE (NOT AT [facility])  I-STAT TROPONIN, ED    EKG EKG Interpretation  Date/Time:  Wednesday May 26 2018 15:30:46 EST Ventricular Rate:  96 PR Interval:    QRS Duration: 88 QT Interval:  342 QTC Calculation: 433 R Axis:   20 Text Interpretation:  Sinus rhythm ST elev, probable normal early repol pattern diffuse ST elevations new from previous Confirmed by Marily Memos [phone]) on 05/26/2018 5:42:23 PM   Radiology Dg Chest 2 View  Result Date: 05/26/2018 CLINICAL DATA:  Chest pain. EXAM: CHEST - 2 VIEW COMPARISON:  Radiograph of Sep 04, 2015. FINDINGS: The heart  size and mediastinal contours are within normal limits. No pneumothorax or pleural effusion is noted. Right lung is clear. Minimal left basilar subsegmental atelectasis is noted. The visualized skeletal structures are unremarkable. IMPRESSION: Minimal left basilar subsegmental atelectasis. Electronically Signed   By: Lupita Raider, M.D.   On: 05/26/2018 16:16    Procedures Procedures (including critical care time)  Medications Ordered in ED Medications  sodium chloride flush (NS) 0.9 % injection 3 mL (3 mLs Intravenous Not Given 05/26/18 1722)  potassium chloride SA (K-DUR,KLOR-CON) CR tablet 40 mEq (40 mEq Oral Given 05/26/18 1836)     Initial Impression / Assessment and Plan / ED Course  I have reviewed the triage vital signs and the nursing notes.  Pertinent labs & imaging results that were available during my care of the patient were reviewed by me and considered in my medical decision making (see chart for details).   Patient is a 38 yo male who presents today for substernal chest pain that started this morning. Chest pain is atypical with no history of previous chest pain. Last ECHO in 2017 normal EF no valvular disease. EKG show diffused ST elevation highly suspicious for pericarditis. Patient had no preceding viral illness. Initial trop was negative 0.00.  Patient's pain in the epigastric region which could also be consistent with GI symptoms such as pancreatitis. Given pleurisy and tachycardia there was concerned for possible PE however D-dimer was negative. CXR show left basilar atelectasis. Patient with mild leukocytosis but based on history and no accompanying symptoms such as cough, fever, shortness of breath low likelihood for pneumonia. Based on presentation physical exam findings and EKG suspect pain is secondary to pericarditis. Bedside US was negative for pericardial fluid. Given history of esophageal varices and GI bleed, patient was discharged on steroid. We avoided NSAIDs given  history of GI bleed.  Final  Clinical Impressions(s) / ED Diagnoses   Final diagnoses:  Acute idiopathic pericarditis    ED Discharge Orders         Ordered    predniSONE (DELTASONE) 20 MG tablet     05/26/18 1826           Lovena Neighboursiallo, Angelicia Lessner, MD 05/26/18 [id number]    Mesner, Jason, MD 05/27/18 1506

## 2018-11-14 ENCOUNTER — Observation Stay (HOSPITAL_BASED_OUTPATIENT_CLINIC_OR_DEPARTMENT_OTHER)
Admission: EM | Admit: 2018-11-14 | Discharge: 2018-11-15 | Disposition: A | Discharge status: Home / Self Care | Payer: BC Managed Care – PPO | Attending: Internal Medicine | Admitting: Internal Medicine

## 2018-11-14 ENCOUNTER — Other Ambulatory Visit: Payer: Self-pay

## 2018-11-14 ENCOUNTER — Encounter (HOSPITAL_BASED_OUTPATIENT_CLINIC_OR_DEPARTMENT_OTHER): Payer: Self-pay | Admitting: Emergency Medicine

## 2018-11-14 DIAGNOSIS — E876 Hypokalemia: Principal | ICD-10-CM | POA: Insufficient documentation

## 2018-11-14 DIAGNOSIS — K7030 Alcoholic cirrhosis of liver without ascites: Secondary | ICD-10-CM | POA: Diagnosis present

## 2018-11-14 DIAGNOSIS — K7031 Alcoholic cirrhosis of liver with ascites: Secondary | ICD-10-CM | POA: Diagnosis not present

## 2018-11-14 DIAGNOSIS — R12 Heartburn: Secondary | ICD-10-CM | POA: Diagnosis not present

## 2018-11-14 DIAGNOSIS — Z87891 Personal history of nicotine dependence: Secondary | ICD-10-CM | POA: Insufficient documentation

## 2018-11-14 DIAGNOSIS — Z79899 Other long term (current) drug therapy: Secondary | ICD-10-CM | POA: Insufficient documentation

## 2018-11-14 DIAGNOSIS — F419 Anxiety disorder, unspecified: Secondary | ICD-10-CM | POA: Diagnosis not present

## 2018-11-14 DIAGNOSIS — Z1159 Encounter for screening for other viral diseases: Secondary | ICD-10-CM | POA: Insufficient documentation

## 2018-11-14 DIAGNOSIS — E8809 Other disorders of plasma-protein metabolism, not elsewhere classified: Secondary | ICD-10-CM | POA: Diagnosis not present

## 2018-11-14 DIAGNOSIS — Z8249 Family history of ischemic heart disease and other diseases of the circulatory system: Secondary | ICD-10-CM | POA: Diagnosis not present

## 2018-11-14 DIAGNOSIS — E860 Dehydration: Secondary | ICD-10-CM

## 2018-11-14 DIAGNOSIS — R000 Tachycardia, unspecified: Secondary | ICD-10-CM | POA: Diagnosis not present

## 2018-11-14 DIAGNOSIS — E43 Unspecified severe protein-calorie malnutrition: Secondary | ICD-10-CM | POA: Insufficient documentation

## 2018-11-14 DIAGNOSIS — K219 Gastro-esophageal reflux disease without esophagitis: Secondary | ICD-10-CM | POA: Diagnosis not present

## 2018-11-14 LAB — COMPREHENSIVE METABOLIC PANEL
ALT: 77 U/L — ABNORMAL HIGH (ref 0–44)
AST: 196 U/L — ABNORMAL HIGH (ref 15–41)
Albumin: 2.9 g/dL — ABNORMAL LOW (ref 3.5–5.0)
Alkaline Phosphatase: 157 U/L — ABNORMAL HIGH (ref 38–126)
Anion gap: 16 — ABNORMAL HIGH (ref 5–15)
BUN: 5 mg/dL — ABNORMAL LOW (ref 6–20)
CO2: 23 mmol/L (ref 22–32)
Calcium: 7.7 mg/dL — ABNORMAL LOW (ref 8.9–10.3)
Chloride: 97 mmol/L — ABNORMAL LOW (ref 98–111)
Creatinine, Ser: 0.73 mg/dL (ref 0.61–1.24)
GFR calc Af Amer: >60 mL/min (ref >60)
GFR calc non Af Amer: >60 mL/min (ref >60)
Glucose, Bld: 109 mg/dL — ABNORMAL HIGH (ref 70–99)
Potassium: 2.2 mmol/L — CL (ref 3.5–5.1)
Sodium: 136 mmol/L (ref 135–145)
Total Bilirubin: 5.5 mg/dL — ABNORMAL HIGH (ref 0.3–1.2)
Total Protein: 7.3 g/dL (ref 6.5–8.1)

## 2018-11-14 LAB — CK TOTAL AND CKMB (NOT AT ARMC)
CK, MB: 52.6 ng/mL — ABNORMAL HIGH (ref 0.5–5.0)
Relative Index: 2.7 — ABNORMAL HIGH (ref 0.0–2.5)
Total CK: 1951 U/L — ABNORMAL HIGH (ref 49–397)

## 2018-11-14 LAB — BASIC METABOLIC PANEL
Anion gap: 13 (ref 5–15)
BUN: 5 mg/dL — ABNORMAL LOW (ref 6–20)
CO2: 23 mmol/L (ref 22–32)
Calcium: 7.2 mg/dL — ABNORMAL LOW (ref 8.9–10.3)
Chloride: 100 mmol/L (ref 98–111)
Creatinine, Ser: 0.65 mg/dL (ref 0.61–1.24)
GFR calc Af Amer: >60 mL/min (ref >60)
GFR calc non Af Amer: >60 mL/min (ref >60)
Glucose, Bld: 97 mg/dL (ref 70–99)
Potassium: 3.6 mmol/L (ref 3.5–5.1)
Sodium: 136 mmol/L (ref 135–145)

## 2018-11-14 LAB — CBC
HCT: 44.3 % (ref 39.0–52.0)
Hemoglobin: 15.2 g/dL (ref 13.0–17.0)
MCH: 33.1 pg (ref 26.0–34.0)
MCHC: 34.3 g/dL (ref 30.0–36.0)
MCV: 96.5 fL (ref 80.0–100.0)
Platelets: 170 10*3/uL (ref 150–400)
RBC: 4.59 MIL/uL (ref 4.22–5.81)
RDW: 13.7 % (ref 11.5–15.5)
WBC: 12 10*3/uL — ABNORMAL HIGH (ref 4.0–10.5)
nRBC: 0 % (ref 0.0–0.2)

## 2018-11-14 LAB — HEPATIC FUNCTION PANEL
ALT: 72 U/L — ABNORMAL HIGH (ref 0–44)
AST: 195 U/L — ABNORMAL HIGH (ref 15–41)
Albumin: 2.6 g/dL — ABNORMAL LOW (ref 3.5–5.0)
Alkaline Phosphatase: 138 U/L — ABNORMAL HIGH (ref 38–126)
Bilirubin, Direct: 2.3 mg/dL — ABNORMAL HIGH (ref 0.0–0.2)
Indirect Bilirubin: 2.6 mg/dL — ABNORMAL HIGH (ref 0.3–0.9)
Total Bilirubin: 4.9 mg/dL — ABNORMAL HIGH (ref 0.3–1.2)
Total Protein: 6.5 g/dL (ref 6.5–8.1)

## 2018-11-14 LAB — SARS CORONAVIRUS 2 BY RT PCR (HOSPITAL ORDER, PERFORMED IN ~~LOC~~ HOSPITAL LAB): SARS Coronavirus 2: NEGATIVE

## 2018-11-14 LAB — MAGNESIUM: Magnesium: 0.9 mg/dL — CL (ref 1.7–2.4)

## 2018-11-14 LAB — TROPONIN I (HIGH SENSITIVITY): Troponin I (High Sensitivity): 9 ng/L (ref <18)

## 2018-11-14 LAB — D-DIMER, QUANTITATIVE: D-Dimer, Quant: 3.41 ug/mL-FEU — ABNORMAL HIGH (ref 0.00–0.50)

## 2018-11-14 MED ORDER — PANTOPRAZOLE SODIUM 40 MG PO TBEC
40.0000 mg | DELAYED_RELEASE_TABLET | Freq: Every day | ORAL | Status: DC
  Administered 2018-11-15: 40 mg via ORAL
  Filled 2018-11-14: qty 1

## 2018-11-14 MED ORDER — SODIUM CHLORIDE 0.9 % IV BOLUS
500.0000 mL | Freq: Once | INTRAVENOUS | Status: AC
  Administered 2018-11-14: 500 mL via INTRAVENOUS

## 2018-11-14 MED ORDER — LACTULOSE 10 GM/15ML PO SOLN
30.0000 g | Freq: Three times a day (TID) | ORAL | Status: DC
  Administered 2018-11-14 – 2018-11-15 (×2): 30 g via ORAL
  Filled 2018-11-14 (×2): qty 45

## 2018-11-14 MED ORDER — ENOXAPARIN SODIUM 40 MG/0.4ML ~~LOC~~ SOLN
40.0000 mg | Freq: Every day | SUBCUTANEOUS | Status: DC
  Administered 2018-11-14: 40 mg via SUBCUTANEOUS
  Filled 2018-11-14: qty 0.4

## 2018-11-14 MED ORDER — POTASSIUM CHLORIDE 10 MEQ/100ML IV SOLN
10.0000 meq | INTRAVENOUS | Status: AC
  Administered 2018-11-14 (×2): 10 meq via INTRAVENOUS
  Filled 2018-11-14 (×2): qty 100

## 2018-11-14 MED ORDER — RIFAXIMIN 550 MG PO TABS
550.0000 mg | ORAL_TABLET | Freq: Every day | ORAL | Status: DC
  Administered 2018-11-15: 550 mg via ORAL
  Filled 2018-11-14: qty 1

## 2018-11-14 MED ORDER — CALCIUM GLUCONATE-NACL 1-0.675 GM/50ML-% IV SOLN
1.0000 g | Freq: Once | INTRAVENOUS | Status: AC
  Administered 2018-11-14: 1000 mg via INTRAVENOUS
  Filled 2018-11-14: qty 50

## 2018-11-14 MED ORDER — CALCIUM GLUCONATE-NACL 1-0.675 GM/50ML-% IV SOLN
INTRAVENOUS | Status: AC
  Administered 2018-11-14: 1000 mg
  Filled 2018-11-14: qty 50

## 2018-11-14 MED ORDER — ACETAMINOPHEN 650 MG RE SUPP: 650.0000 mg | Freq: Four times a day (QID) | RECTAL | Status: DC | PRN

## 2018-11-14 MED ORDER — MAGNESIUM SULFATE 2 GM/50ML IV SOLN
2.0000 g | Freq: Once | INTRAVENOUS | Status: AC
  Administered 2018-11-14: 2 g via INTRAVENOUS
  Filled 2018-11-14: qty 50

## 2018-11-14 MED ORDER — POTASSIUM CHLORIDE CRYS ER 20 MEQ PO TBCR
40.0000 meq | EXTENDED_RELEASE_TABLET | Freq: Once | ORAL | Status: AC
  Administered 2018-11-14: 40 meq via ORAL
  Filled 2018-11-14: qty 2

## 2018-11-14 MED ORDER — FUROSEMIDE 40 MG PO TABS
40.0000 mg | ORAL_TABLET | Freq: Every day | ORAL | Status: DC
  Administered 2018-11-15: 40 mg via ORAL
  Filled 2018-11-14: qty 1

## 2018-11-14 MED ORDER — POTASSIUM CHLORIDE CRYS ER 20 MEQ PO TBCR
20.0000 meq | EXTENDED_RELEASE_TABLET | Freq: Two times a day (BID) | ORAL | Status: DC
  Administered 2018-11-14: 20 meq via ORAL
  Filled 2018-11-14: qty 1

## 2018-11-14 MED ORDER — SODIUM CHLORIDE 0.9 % IV SOLN
1.0000 g | Freq: Once | INTRAVENOUS | Status: DC
  Filled 2018-11-14: qty 10

## 2018-11-14 MED ORDER — NADOLOL 20 MG PO TABS
20.0000 mg | ORAL_TABLET | Freq: Every day | ORAL | Status: DC
  Administered 2018-11-15: 20 mg via ORAL
  Filled 2018-11-14: qty 1

## 2018-11-14 MED ORDER — PRO-STAT SUGAR FREE PO LIQD
30.0000 mL | Freq: Two times a day (BID) | ORAL | Status: DC
  Administered 2018-11-14 – 2018-11-15 (×2): 30 mL via ORAL
  Filled 2018-11-14 (×2): qty 30

## 2018-11-14 MED ORDER — PNEUMOCOCCAL VAC POLYVALENT 25 MCG/0.5ML IJ INJ
0.5000 mL | INJECTION | INTRAMUSCULAR | Status: DC
  Filled 2018-11-14: qty 0.5

## 2018-11-14 MED ORDER — POTASSIUM CHLORIDE 10 MEQ/100ML IV SOLN
10.0000 meq | Freq: Once | INTRAVENOUS | Status: AC
  Administered 2018-11-14: 10 meq via INTRAVENOUS
  Filled 2018-11-14: qty 100

## 2018-11-14 MED ORDER — ACETAMINOPHEN 325 MG PO TABS: 650.0000 mg | ORAL_TABLET | Freq: Four times a day (QID) | ORAL | Status: DC | PRN

## 2018-11-14 MED ORDER — MAGNESIUM SULFATE 50 % IJ SOLN: 1.0000 g | Freq: Once | INTRAMUSCULAR | Status: DC

## 2018-11-14 MED ORDER — SERTRALINE HCL 100 MG PO TABS
100.0000 mg | ORAL_TABLET | Freq: Every day | ORAL | Status: DC
  Administered 2018-11-15: 100 mg via ORAL
  Filled 2018-11-14: qty 1

## 2018-11-14 MED ORDER — SPIRONOLACTONE 25 MG PO TABS
50.0000 mg | ORAL_TABLET | Freq: Every day | ORAL | Status: DC
  Filled 2018-11-14: qty 2

## 2018-11-14 MED ORDER — POTASSIUM CHLORIDE IN NACL 20-0.9 MEQ/L-% IV SOLN
INTRAVENOUS | Status: DC
  Filled 2018-11-14: qty 1000

## 2018-11-14 NOTE — Progress Notes (Signed)
Xcover 38 yo male with hx of ETOH abuse, apparently presented with weakness and hypokalemia of 2.2.  Potassium repleted, but pt still tachycardic, and ED, concerned and requested observation admission for tachycardia.

## 2018-11-14 NOTE — ED Triage Notes (Signed)
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 Patient reports did not eat or drink anything today. Pt presents with cramping to fingers, pain to B/L calves and arms.

## 2018-11-14 NOTE — ED Notes (Signed)
ED Provider at bedside. 

## 2018-11-14 NOTE — ED Provider Notes (Signed)
[facility] EMERGENCY DEPARTMENT Provider Note   CSN: [id number] Arrival date & time: 11/14/18  1139    History   Chief Complaint Chief Complaint  Patient presents with  . Dehydration    HPI [name] is a 38 y.o. male.     HPI Patient presents with bilateral hand cramping.  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  States that began to have his hands cramp up at the end of the day.  States he not been eating or drinking much today.  No fevers.  States he has had this before.  History of his magnesium and potassium going low.  History of cirrhosis due to alcohol.  No bleeding.  Has been doing well the last few days. Past Medical History:  Diagnosis Date  . Cirrhosis of liver (HCC)   . Esophageal varices with hemorrhage (HCC)   . ETOH abuse   . GERD (gastroesophageal reflux disease)     Patient Active Problem List   Diagnosis Date Noted  . Acute encephalopathy   . Acute upper GI bleed   . Ascites   . Distended abdomen   . Hemorrhagic shock (HCC)   . Encounter for palliative care   . Goals of care, counseling/discussion   . Encephalopathy acute 08/27/2015  . Alcoholic cirrhosis of liver with ascites (HCC)   . Acute hypoxemic respiratory failure (HCC)   . SBP (spontaneous bacterial peritonitis) (HCC)   . Hypocalcemia 08/21/2015  . Hypomagnesemia 08/21/2015  . Acute blood loss anemia 08/20/2015  . Acute GI bleeding 08/20/2015  . ETOH abuse 08/20/2015  . Elevated INR 08/20/2015  . Hypoalbuminemia 08/20/2015  . Hypokalemia 08/20/2015    Past Surgical History:  Procedure Laterality Date  . ESOPHAGOGASTRODUODENOSCOPY Left 08/21/2015   Procedure: ESOPHAGOGASTRODUODENOSCOPY (EGD);  Surgeon: Charlott RakesVincent Schooler, MD;  [facility];  Service: Endoscopy;  Laterality: Left;        Home Medications    Prior to Admission medications   Medication Sig Start Date End Date Taking? Authorizing Provider  furosemide (LASIX) 40  MG tablet Take 1 tablet (40 mg total) by mouth daily. 09/08/15   Jerald Kiefhiu, Stephen K, MD  IRON PO Take 1 tablet by mouth daily.    [provider]  lactulose (CHRONULAC) 10 GM/15ML solution Take 45 mLs (30 g total) by mouth 3 (three) times daily. 09/08/15   Jerald Kiefhiu, Stephen K, MD  nadolol (CORGARD) 20 MG tablet Take 20 mg by mouth daily. 05/23/18   [provider]  omeprazole (PRILOSEC) 20 MG capsule Take 20 mg by mouth daily.    [provider]  potassium chloride SA (K-DUR,KLOR-CON) 20 MEQ tablet Take 20 mEq by mouth 2 (two) times daily. 05/26/18   [provider]  rifaximin (XIFAXAN) 550 MG TABS tablet Take 1 tablet (550 mg total) by mouth 3 (three) times daily. Patient taking differently: Take 550 mg by mouth 2 (two) times daily.  09/08/15   Jerald Kiefhiu, Stephen K, MD  sertraline (ZOLOFT) 100 MG tablet Take 200 mg by mouth daily. 05/18/18   [provider]  spironolactone (ALDACTONE) 50 MG tablet Take 1 tablet (50 mg total) by mouth daily. Patient not taking: Reported on 05/26/2018 09/08/15   Jerald Kiefhiu, Stephen K, MD    Family History Family History  Problem Relation Age of Onset  . Peptic Ulcer Paternal Aunt   . Crohn's disease Maternal Uncle   . Heart attack Maternal Uncle   . Heart attack Maternal Grandfather   .  Stroke Maternal Grandmother   . Stroke Paternal Grandfather     Social History Social History   Tobacco Use  . Smoking status: Former Smoker    Packs/day: 1.00    Types: Cigarettes  . Smokeless tobacco: Never Used  Substance Use Topics  . Alcohol use: Not Currently  . Drug use: Yes    Types: Marijuana     Allergies   Penicillins   Review of Systems Review of Systems  Constitutional: Negative for appetite change.  HENT: Negative for congestion.   Respiratory: Negative for choking.   Cardiovascular: Negative for chest pain.  Gastrointestinal: Positive for abdominal distention.  Genitourinary: Negative for flank pain.  Musculoskeletal:  Negative for back pain.  Skin: Negative for rash.  Neurological: Negative for speech difficulty, weakness and light-headedness.  Psychiatric/Behavioral: Negative for confusion.     Physical Exam Updated Vital Signs BP (!) 126/92   Pulse 93   Temp 98.9 F (37.2 C) (Oral)   Resp 18   Ht 5\' 7"  (1.702 m)   Wt 59 kg   SpO2 97%   BMI 20.36 kg/m   Physical Exam Vitals signs and nursing note reviewed.  Constitutional:      Appearance: Normal appearance.  HENT:     Head: Atraumatic.  Cardiovascular:     Rate and Rhythm: Regular rhythm.  Pulmonary:     Effort: Pulmonary effort is normal.  Abdominal:     General: There is distension.     Comments: Abdomen distended, likely from ascites.  Musculoskeletal:     Right lower leg: No edema.     Left lower leg: No edema.  Skin:    General: Skin is warm.     Capillary Refill: Capillary refill takes less than 2 seconds.     Coloration: Skin is not jaundiced.  Neurological:     General: No focal deficit present.     Comments: Bilateral hands constricted.  Psychiatric:        Mood and Affect: Mood normal.      ED Treatments / Results  Labs (all labs ordered are listed, but only abnormal results are displayed) Labs Reviewed  CBC - Abnormal; Notable for the following components:      Result Value   WBC 12.0 (*)    All other components within normal limits  MAGNESIUM - Abnormal; Notable for the following components:   Magnesium 0.9 (*)    All other components within normal limits  COMPREHENSIVE METABOLIC PANEL - Abnormal; Notable for the following components:   Potassium 2.2 (*)    Chloride 97 (*)    Glucose, Bld 109 (*)    BUN 5 (*)    Calcium 7.7 (*)    Albumin 2.9 (*)    AST 196 (*)    ALT 77 (*)    Alkaline Phosphatase 157 (*)    Total Bilirubin 5.5 (*)    Anion gap 16 (*)    All other components within normal limits  SARS CORONAVIRUS 2 (HOSPITAL ORDER, PERFORMED IN [facility])    EKG None   Radiology No results found.  Procedures Procedures (including critical care time)  Medications Ordered in ED Medications  potassium chloride 10 mEq in 100 mL IVPB (10 mEq Intravenous New Bag/Given 11/14/18 1421)  sodium chloride 0.9 % bolus 500 mL (0 mLs Intravenous Stopped 11/14/18 1329)     Initial Impression / Assessment and Plan / ED Course  I have reviewed the triage vital signs and the nursing  notes.  Pertinent labs & imaging results that were available during my care of the patient were reviewed by me and considered in my medical decision making (see chart for details).        Patient with muscle spasms particularly in his hands.  Likely dehydration component since he is outside in the heat but also has some electrolyte abnormalities with a potassium of 2.2 and magnesium of 0.9.  He is on chronic potassium supplementation due to cirrhosis.  States he may have missed a day of it however.  With the muscle cramps and multiple abnormalities including a low calcium I feels the patient benefit from admission to the hospital for further adjustment of this.  Will discuss with hospitalist.  Final Clinical Impressions(s) / ED Diagnoses   Final diagnoses:  Hypokalemia  Hypomagnesemia  Hypocalcemia    ED Discharge Orders    None       Davonna Belling, MD 11/14/18 1423

## 2018-11-14 NOTE — ED Notes (Signed)
Called [name] at [facility] for consult to Hospitalist

## 2018-11-14 NOTE — ED Notes (Signed)
Attempted to call report. Left a callback number

## 2018-11-14 NOTE — Plan of Care (Signed)

## 2018-11-14 NOTE — H&P (Addendum)
TRH H&P    Patient Demographics:    [name], is a 38 y.o. male  MRN: [id number]  DOB - Dec 23, 1980  Admit Date - 11/14/2018  Referring MD/NP/PA:  Davonna Belling  Outpatient Primary MD for the patient is London Pepper, MD  Patient coming from:  Home-> [place]  Chief complaint-   Hypokalemia, hypocalcemia, hypomagnesemia,    HPI:    [name]  is a 38 y.o. 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  Pt notes that he has been taking his lactulose. Since he has had 3 days off and had more bowel movement than normal.   In ED,  T 98.9 P 107 R 18 Bp 126/93  Pox 100% on RA  Wbc 12.0, hgb 15.2, Plt 170 Magnesium 0.9 Na 136, K 2.2 Bun 5, Creatinine 0.73 Alb 2.9 Ast 196, Alt 77, Alk phos 157, T. Bili 5.5  Covid -19 negative  Na 136, K 3.6, Bun 5, Creatinine 0.65 Calcium 7.2  Pt was given magnesium sulfate 1gm iv x1 in the ED, along with potassium.    Though potassium improved in ED, pt remained tachycardic w hr >100 and ER was concerned.  Troponin supposedly negative, though I can't see the result.  Will admit observation.              Review of systems:    In addition to the HPI above,  No Fever-chills, No Headache, No changes with Vision or hearing, No problems swallowing food or Liquids, No Chest pain, Cough or Shortness of Breath, No Abdominal pain, No Nausea or Vomiting, No Blood in stool or Urine, No dysuria, No new skin rashes or bruises, No new joints pains-aches,  No new weakness, tingling, numbness in any extremity, No recent weight gain or loss, No polyuria, polydypsia or polyphagia, No significant Mental Stressors.  All other systems reviewed and are negative.    Past History of the following :    Past Medical History:  Diagnosis Date  . Cirrhosis of liver ([place])    . 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 ETOH abuse   . GERD (gastroesophageal reflux disease)       Past Surgical History:  Procedure Laterality Date  . ESOPHAGOGASTRODUODENOSCOPY Left 08/21/2015   Procedure: ESOPHAGOGASTRODUODENOSCOPY (EGD);  Surgeon: Wilford Corner, MD;  Location: [facility];  Service: Endoscopy;  Laterality: Left;      Social History:      Social History   Tobacco Use  . Smoking status: Former Smoker    Packs/day: 1.00    Types: Cigarettes  . Smokeless tobacco: Never Used  Substance Use Topics  . Alcohol use: Not Currently       Family History :     Family History  Problem Relation Age of Onset  . Peptic Ulcer Paternal Aunt   . Crohn's disease Maternal Uncle   . Heart attack Maternal Uncle   . Heart attack Maternal Grandfather   .  Stroke Maternal Grandmother   . Stroke Paternal Grandfather        Home Medications:   Prior to Admission medications   Medication Sig Start Date End Date Taking? Authorizing Provider  furosemide (LASIX) 40 MG tablet Take 1 tablet (40 mg total) by mouth daily. 09/08/15  Yes Donne Hazel, MD  IRON PO Take 1 tablet by mouth daily.   Yes [provider]  lactulose (CHRONULAC) 10 GM/15ML solution Take 45 mLs (30 g total) by mouth 3 (three) times daily. 09/08/15  Yes Donne Hazel, MD  omeprazole (PRILOSEC) 20 MG capsule Take 20 mg by mouth daily.   Yes [provider]  potassium chloride SA (K-DUR,KLOR-CON) 20 MEQ tablet Take 20 mEq by mouth 2 (two) times daily. 05/26/18  Yes [provider]  rifaximin (XIFAXAN) 550 MG TABS tablet Take 1 tablet (550 mg total) by mouth 3 (three) times daily. Patient taking differently: Take 550 mg by mouth daily.  09/08/15  Yes Donne Hazel, MD  sertraline (ZOLOFT) 100 MG tablet Take 100 mg by mouth daily.  05/18/18  Yes [provider]  nadolol (CORGARD) 20 MG tablet Take 20 mg by mouth daily. 05/23/18   [provider]   spironolactone (ALDACTONE) 50 MG tablet Take 1 tablet (50 mg total) by mouth daily. Patient not taking: Reported on 05/26/2018 09/08/15   Donne Hazel, MD     Allergies:     Allergies  Allergen Reactions  . Penicillins Hives     Physical Exam:   Vitals  Blood pressure (!) 124/97, pulse 98, temperature 98.4 F (36.9 C), temperature source Oral, resp. rate 18, height 5' 7"  (1.702 m), weight 59.4 kg, SpO2 98 %.  1.  General: aoxo3  2. Psychiatric: euthymic  3. Neurologic: cn2-12 intact, reflexes 2+ symmetric, diffuse with downgoing toes bilaterally, motor 5/5 in all 4 ext  4. HEENMT:  Icteric, 1.60m symmetric, direct, consensual, near intact Neck: no jvd, no bruit  5. Respiratory : CTAB  6. Cardiovascular : rrr s1, s2, no m/g/r  7. Gastrointestinal:  Abd: soft, nt, +bs  , slightly distended  8. Skin:  Ext: no c/c/e,  Slight palmar erythema  9.Musculoskeletal:  Good ROM  No adenopathy    Data Review:    CBC Recent Labs  Lab 11/14/18 1212  WBC 12.0*  HGB 15.2  HCT 44.3  PLT 170  MCV 96.5  MCH 33.1  MCHC 34.3  RDW 13.7   ------------------------------------------------------------------------------------------------------------------  Results for orders placed or performed during the hospital encounter of 11/14/18 (from the past 48 hour(s))  CBC     Status: Abnormal   Collection Time: 11/14/18 12:12 PM  Result Value Ref Range   WBC 12.0 (H) 4.0 - 10.5 K/uL   RBC 4.59 4.22 - 5.81 MIL/uL   Hemoglobin 15.2 13.0 - 17.0 g/dL   HCT 44.3 39.0 - 52.0 %   MCV 96.5 80.0 - 100.0 fL   MCH 33.1 26.0 - 34.0 pg   MCHC 34.3 30.0 - 36.0 g/dL   RDW 13.7 11.5 - 15.5 %   Platelets 170 150 - 400 K/uL   nRBC 0.0 0.0 - 0.2 %    Comment: Performed at [facility] [address] [place] Magnesium     Status: Abnormal   Collection Time: 11/14/18 12:46 PM  Result Value Ref Range   Magnesium 0.9 (LL) 1.7 - 2.4 mg/dL    Comment:  CRITICAL RESULT CALLED  TO, READ BACK BY AND VERIFIED WITH: CALLED TO [place] RN AT [place] ON [id number] Performed at [facility], [address] [place], [place] [address]   Comprehensive metabolic panel     Status: Abnormal   Collection Time: 11/14/18 12:46 PM  Result Value Ref Range   Sodium 136 135 - 145 mmol/L   Potassium 2.2 (LL) 3.5 - 5.1 mmol/L    Comment: CRITICAL RESULT CALLED TO, READ BACK BY AND VERIFIED WITH: CALLED TO S.GOUGE AT [address] BY [place] ON [id number] REPEATED TO VERIFY    Chloride 97 (L) 98 - 111 mmol/L   CO2 23 22 - 32 mmol/L   Glucose, Bld 109 (H) 70 - 99 mg/dL   BUN 5 (L) 6 - 20 mg/dL   Creatinine, Ser 0.73 0.61 - 1.24 mg/dL   Calcium 7.7 (L) 8.9 - 10.3 mg/dL   Total Protein 7.3 6.5 - 8.1 g/dL   Albumin 2.9 (L) 3.5 - 5.0 g/dL   AST 196 (H) 15 - 41 U/L   ALT 77 (H) 0 - 44 U/L   Alkaline Phosphatase 157 (H) 38 - 126 U/L   Total Bilirubin 5.5 (H) 0.3 - 1.2 mg/dL   GFR calc non Af Amer >60 >60 mL/min   GFR calc Af Amer >60 >60 mL/min   Anion gap 16 (H) 5 - 15    Comment: Performed at [facility], [address] [place], [place] [address]  SARS Coronavirus 2 (Performed in [place] hospital lab)     Status: None   Collection Time: 11/14/18  2:22 PM   Specimen: Nasopharyngeal Swab  Result Value Ref Range   SARS Coronavirus 2 NEGATIVE NEGATIVE    Comment: (NOTE) If result is NEGATIVE SARS-CoV-2 target nucleic acids are NOT DETECTED. The SARS-CoV-2 RNA is generally detectable in upper and lower  respiratory specimens during the acute phase of infection. The lowest  concentration of SARS-CoV-2 viral copies this assay can detect is 250  copies / mL. A negative result does not preclude SARS-CoV-2 infection  and should not be used as the sole basis for treatment or other  patient management decisions.  A negative result may occur with  improper specimen collection / handling, submission of specimen other  than nasopharyngeal swab,  presence of viral mutation(s) within the  areas targeted by this assay, and inadequate number of viral copies  (<250 copies / mL). A negative result must be combined with clinical  observations, patient history, and epidemiological information. If result is POSITIVE SARS-CoV-2 target nucleic acids are DETECTED. The SARS-CoV-2 RNA is generally detectable in upper and lower  respiratory specimens dur ing the acute phase of infection.  Positive  results are indicative of active infection with SARS-CoV-2.  Clinical  correlation with patient history and other diagnostic information is  necessary to determine patient infection status.  Positive results do  not rule out bacterial infection or co-infection with other viruses. If result is PRESUMPTIVE POSTIVE SARS-CoV-2 nucleic acids MAY BE PRESENT.   A presumptive positive result was obtained on the submitted specimen  and confirmed on repeat testing.  While 2019 novel coronavirus  (SARS-CoV-2) nucleic acids may be present in the submitted sample  additional confirmatory testing may be necessary for epidemiological  and / or clinical management purposes  to differentiate between  SARS-CoV-2 and other Sarbecovirus currently known to infect humans.  If clinically indicated additional testing with an alternate test  methodology [phone]) is advised. The  SARS-CoV-2 RNA is generally  detectable in upper and lower respiratory sp ecimens during the acute  phase of infection. The expected result is Negative. Fact Sheet for Patients:  StrictlyIdeas.no Fact Sheet for Healthcare Providers: BankingDealers.co.za This test is not yet approved or cleared by the [place] FDA and has been authorized for detection and/or diagnosis of SARS-CoV-2 by FDA under an Emergency Use Authorization (EUA).  This EUA will remain in effect (meaning this test can be used) for the duration of the COVID-19 declaration under  Section 564(b)(1) of the Act, 21 U.S.C. section 360bbb-3(b)(1), unless the authorization is terminated or revoked sooner. Performed at [facility], [address] [place], [place] [address]   Basic metabolic panel     Status: Abnormal   Collection Time: 11/14/18  6:46 PM  Result Value Ref Range   Sodium 136 135 - 145 mmol/L   Potassium 3.6 3.5 - 5.1 mmol/L    Comment: DELTA CHECK NOTED   Chloride 100 98 - 111 mmol/L   CO2 23 22 - 32 mmol/L   Glucose, Bld 97 70 - 99 mg/dL   BUN 5 (L) 6 - 20 mg/dL   Creatinine, Ser 0.65 0.61 - 1.24 mg/dL   Calcium 7.2 (L) 8.9 - 10.3 mg/dL   GFR calc non Af Amer >60 >60 mL/min   GFR calc Af Amer >60 >60 mL/min   Anion gap 13 5 - 15    Comment: Performed at [facility], [address] [place], [place] [address]  Hepatic function panel     Status: Abnormal   Collection Time: 11/14/18  8:15 PM  Result Value Ref Range   Total Protein 6.5 6.5 - 8.1 g/dL   Albumin 2.6 (L) 3.5 - 5.0 g/dL   AST 195 (H) 15 - 41 U/L   ALT 72 (H) 0 - 44 U/L   Alkaline Phosphatase 138 (H) 38 - 126 U/L   Total Bilirubin 4.9 (H) 0.3 - 1.2 mg/dL   Bilirubin, Direct 2.3 (H) 0.0 - 0.2 mg/dL   Indirect Bilirubin 2.6 (H) 0.3 - 0.9 mg/dL    Comment: Performed at [facility], [address] [place], [place] [address]  Troponin I (High Sensitivity)     Status: None   Collection Time: 11/14/18  8:15 PM  Result Value Ref Range   Troponin I (High Sensitivity) 9 <18 ng/L    Comment: (NOTE) Elevated high sensitivity troponin I (hsTnI) values and significant  changes across serial measurements may suggest ACS but many other  chronic and acute conditions are known to elevate hsTnI results.  Refer to the "Links" section for chest pain algorithms and additional  guidance. Performed at [facility], [address] [place], [place] [address]   D-dimer, quantitative (not at [facility])     Status: Abnormal   Collection Time: 11/14/18  8:15 PM   Result Value Ref Range   D-Dimer, Quant 3.41 (H) 0.00 - 0.50 ug/mL-FEU    Comment: (NOTE) At the manufacturer cut-off of 0.50 ug/mL FEU, this assay has been documented to exclude PE with a sensitivity and negative predictive value of 97 to 99%.  At this time, this assay has not been approved by the FDA to exclude DVT/VTE. Results should be correlated with clinical presentation. Performed at [facility], [address] [place], [place] [address]     [place]  Lab 11/14/18 1246 11/14/18 1846 11/14/18  2015  NA 136 136  --   K 2.2* 3.6  --   CL 97* 100  --   CO2 23 23  --   GLUCOSE 109* 97  --   BUN 5* 5*  --   CREATININE 0.73 0.65  --   CALCIUM 7.7* 7.2*  --   MG 0.9*  --   --   AST 196*  --  195*  ALT 77*  --  72*  ALKPHOS 157*  --  138*  BILITOT 5.5*  --  4.9*   ------------------------------------------------------------------------------------------------------------------  ------------------------------------------------------------------------------------------------------------------ GFR: Estimated Creatinine Clearance: 105.2 mL/min (by C-G formula based on SCr of 0.65 mg/dL). Liver Function Tests: Recent Labs  Lab 11/14/18 1246 11/14/18 2015  AST 196* 195*  ALT 77* 72*  ALKPHOS 157* 138*  BILITOT 5.5* 4.9*  PROT 7.3 6.5  ALBUMIN 2.9* 2.6*   No results for input(s): LIPASE, AMYLASE in the last 168 hours. No results for input(s): AMMONIA in the last 168 hours. Coagulation Profile: No results for input(s): INR, PROTIME in the last 168 hours. Cardiac Enzymes: No results for input(s): CKTOTAL, CKMB, CKMBINDEX, TROPONINI in the last 168 hours. BNP (last 3 results) No results for input(s): PROBNP in the last 8760 hours. HbA1C: No results for input(s): HGBA1C in the last 72 hours. CBG: No results for input(s): GLUCAP in the last 168 hours. Lipid Profile: No results for input(s): CHOL, HDL, LDLCALC, TRIG, CHOLHDL, LDLDIRECT in  the last 72 hours. Thyroid Function Tests: No results for input(s): TSH, T4TOTAL, FREET4, T3FREE, THYROIDAB in the last 72 hours. Anemia Panel: No results for input(s): VITAMINB12, FOLATE, FERRITIN, TIBC, IRON, RETICCTPCT in the last 72 hours.  --------------------------------------------------------------------------------------------------------------- Urine analysis:    Component Value Date/Time   COLORURINE ORANGE (A) 09/04/2015 1300   APPEARANCEUR CLEAR 09/04/2015 1300   LABSPEC 1.030 09/04/2015 1300   PHURINE 8.5 (H) 09/04/2015 1300   GLUCOSEU NEGATIVE 09/04/2015 1300   HGBUR NEGATIVE 09/04/2015 1300   BILIRUBINUR LARGE (A) 09/04/2015 1300   KETONESUR NEGATIVE 09/04/2015 1300   PROTEINUR NEGATIVE 09/04/2015 1300   NITRITE POSITIVE (A) 09/04/2015 1300   LEUKOCYTESUR SMALL (A) 09/04/2015 1300      Imaging Results:    No results found.  ekg st at 105, borderline LAD, early R progression, + u waves   Assessment & Plan:    Principal Problem:   Tachycardia Active Problems:   Hypoalbuminemia   Hypokalemia   Hypocalcemia   Hypomagnesemia   Alcoholic cirrhosis of liver with ascites (HCC)  Tachycardia Tele TSH, Trop I in am Consider cardiac echo if not improving  Hypocalcemia Calcium gluconate 1gm iv x1 Check cmp in am  Hypomagnesemia repleted Check magnesium in am  Hypokalemia Hydrate with ns + kcl 20 meq iv Check cmp in am  Abnormal liver function, Cirrhosis Last RUQ ultrasound 10/22/2017 Recommended that he follow up with GI as outpatient counseled that he is at higher risk of liver cancer and needs routine ultrasound on a yearly basis Cont Lactulose (may need to cut back) Cont Rifaxamin Cont Nadolol Cont Spironolactone Cont Furosemide  Gerd Cont PPI  Anxiety Cont Zoloft  Severe protein calorie malnutrition prostat 62m po bid  DVT Prophylaxis-   Lovenox - SCDs   AM Labs Ordered, also please review Full Orders  Family Communication:  Admission, patients condition and plan of care including tests being ordered have been discussed with the patient  who indicate understanding and agree with the plan and Code Status.  Code Status:  FULL CODE,    Admission status: Observation : Based on patients clinical presentation and evaluation of above clinical data, I have made determination that patient meets  criteria at this time.   Time spent in minutes : 70   Jani Gravel M.D on 11/14/2018 at 10:22 PM

## 2018-11-14 NOTE — Progress Notes (Signed)
PROGRESS NOTE    Patient: [name]                            PCP: London Pepper, MD                    DOB: Oct 13, 1980            DOA: 11/14/2018 [id number]             DOS: 11/14/2018, 4:09 PM   LOS: 0 days   Date of Service: The patient was seen and examined on 11/14/2018  Subjective:   Dehydration / hypokalemia  Brief Narrative:   37 year old male presented [facility] with generalized cramping.  Patient reported he is has been working outside in the heat.  He began cramping up then today. According to the patient has not been eating or drinking much. Patient resembles of no heatstroke, as he did not have any altered mental status, vitals were stable, evaded temperature.  Patient past medical history significant for history of alcohol abuse, liver cirrhosis, esophageal varices, GERD.   Otherwise stable denied having any fever, chills, nausea, vomiting.  Denies any headaches visual change asymmetric weaknesses.  Denies any abdominal pain constipation or diarrhea.  Denies any dysuria.  Denies any joint pain open wounds or rash. No complaint of tarry black stool or frank blood per rectum.   ED evaluation Revealed vitals were stable: CMP within normal notes exception of potassium 2.2, chloride 97 BUN 5 creatinine 0.73, anion gap of 16, magnesium 0.9,  Chronic but improved LFTs alk phos 157, AST 196, ALT 77 WBC 12.0 otherwise within normal limits SARS-CoV-2 negative   Call was received from ED physician provider: So far patient has been given only 10 mEq of potassium, and a 500 mg of IV fluid.  We have recommended 2- 3 L of IV fluid resuscitation, 60 mEq of KCl,  low magnesium 0.9 needs 2 g of magnesium IV.  Also to have not checked patient's alcohol level and urine drug screen. Which we strongly recommend for urine drug screen to be completed along with serum alcohol level.  CMP recommended after above recommendation,, if patient is symptomatic and potassium still  remains low will admit for observation.     Assessment & Plan:   Dehydration/hypokalemia  -Recommend patient to be hydrated with 2/3 L of normal saline -60 mEq of potassium to be given -Rechecking CMP,  Disposition Plan: -Patient to be discharged if potassium is at least 3.5 and above Continue oral hydration -Patient may be discharged home    Procedures:   No admission procedures for hospital encounter.    Antimicrobials:  Anti-infectives (From admission, onward)   None       Medication:  . potassium chloride SA  40 mEq Oral Once       Objective:   Vitals:   11/14/18 1315 11/14/18 1400 11/14/18 1430 11/14/18 1515  BP: (!) 134/95 (!) 126/92 (!) 126/91 128/87  Pulse: 96 93 (!) 108 100  Resp: 18  13 (!) 21  Temp:      TempSrc:      SpO2: 99% 97% 98% 97%  Weight:      Height:        Intake/Output Summary (Last 24 hours) at 11/14/2018 1609 Last data filed at 11/14/2018 1521 Gross per 24 hour  Intake 700 ml  Output -  Net 700 ml   Filed Weights   11/14/18  1151  Weight: 59 kg     Examination:   Physical Exam  Constitution:  Alert, cooperative, no distress,  Appears calm and comfortable  Psychiatric: Normal and stable mood and affect, cognition intact,   HEENT: Normocephalic, PERRL, otherwise with in Normal limits  Chest:Chest symmetric Cardio vascular:  S1/S2, RRR, No murmure, No Rubs or Gallops  pulmonary: Clear to auscultation bilaterally, respirations unlabored, negative wheezes / crackles Abdomen: Soft, non-tender, non-distended, bowel sounds,no masses, no organomegaly Muscular skeletal: Limited exam - in bed, able to move all 4 extremities, Normal strength,  Neuro: CNII-XII intact. , normal motor and sensation, reflexes intact  Extremities: No pitting edema lower extremities, +2 pulses  Skin: Dry, warm to touch, negative for any Rashes, No open wounds Wounds: per nursing documentation  LABs:  CBC Latest Ref Rng & Units 11/14/2018 05/26/2018  09/08/2015  WBC 4.0 - 10.5 K/uL 12.0(H) 11.2(H) 15.9(H)  Hemoglobin 13.0 - 17.0 g/dL 15.2 14.4 8.2(L)  Hematocrit 39.0 - 52.0 % 44.3 42.6 25.4(L)  Platelets 150 - 400 K/uL 170 142(L) 182   CMP Latest Ref Rng & Units 11/14/2018 05/26/2018 09/08/2015  Glucose 70 - 99 mg/dL 109(H) 143(H) 101(H)  BUN 6 - 20 mg/dL 5(L) 5(L) 6  Creatinine 0.61 - 1.24 mg/dL 0.73 1.04 0.63  Sodium 135 - 145 mmol/L 136 137 136  Potassium 3.5 - 5.1 mmol/L 2.2(LL) 3.4(L) 4.1  Chloride 98 - 111 mmol/L 97(L) 97(L) 104  CO2 22 - 32 mmol/L 23 30 25   Calcium 8.9 - 10.3 mg/dL 7.7(L) 9.0 7.8(L)  Total Protein 6.5 - 8.1 g/dL 7.3 7.2 -  Total Bilirubin 0.3 - 1.2 mg/dL 5.5(H) 1.5(H) -  Alkaline Phos 38 - 126 U/L 157(H) 50 -  AST 15 - 41 U/L 196(H) 32 -  ALT 0 - 44 U/L 77(H) 14 -        SIGNED: Deatra James, MD, FACP, FHM. [facility],  Pager [phone]  If 7PM-7AM, please contact night-coverage [email or web address] 11/14/2018, 4:09 PM

## 2018-11-14 NOTE — ED Provider Notes (Signed)
Patient is a 38 year old male who presents after likely dehydration heat exhaustion from working outside.  He was found to be markedly hypokalemic, with markedly low magnesium and calcium levels as well.  He was given IV fluid hydration and potassium replacement.  The plan with Dr. Alvino Chapel was to admit him given his markedly low potassium.  I spoke with the hospitalist, Dr. Alfredia Ferguson around 330 this afternoon and he did not feel that the patient warranted admission without attempting to correct his potassium and possibly discharge him.  He was given potassium replacement as well as magnesium and calcium.  A recheck on his labs do show an improvement in his potassium to 3.6.  However patient is still markedly tachycardic with a heart rate of 120.  He denies any current alcohol use which would be more concerning for withdrawal.  He overall feels better.  However he has been here now for 8 hours and I feel it is most appropriate to admit him for observation and continue hydration and electrolyte replacement.  I spoke with Dr. Maudie Mercury and he will admit the patient for further treatment.   Malvin Johns, MD 11/14/18 [phone]

## 2018-11-14 NOTE — ED Notes (Signed)
Report given to [facility] 

## 2018-11-14 NOTE — ED Notes (Signed)
K= 2.2 and Mg 0.9, results given to ED MD and primary RN

## 2018-11-15 DIAGNOSIS — E8809 Other disorders of plasma-protein metabolism, not elsewhere classified: Secondary | ICD-10-CM | POA: Diagnosis not present

## 2018-11-15 DIAGNOSIS — Z1159 Encounter for screening for other viral diseases: Secondary | ICD-10-CM | POA: Diagnosis not present

## 2018-11-15 DIAGNOSIS — K7031 Alcoholic cirrhosis of liver with ascites: Secondary | ICD-10-CM | POA: Diagnosis not present

## 2018-11-15 DIAGNOSIS — R000 Tachycardia, unspecified: Secondary | ICD-10-CM | POA: Diagnosis not present

## 2018-11-15 DIAGNOSIS — E876 Hypokalemia: Secondary | ICD-10-CM | POA: Diagnosis not present

## 2018-11-15 LAB — COMPREHENSIVE METABOLIC PANEL
ALT: 84 U/L — ABNORMAL HIGH (ref 0–44)
AST: 285 U/L — ABNORMAL HIGH (ref 15–41)
Albumin: 2.7 g/dL — ABNORMAL LOW (ref 3.5–5.0)
Alkaline Phosphatase: 136 U/L — ABNORMAL HIGH (ref 38–126)
Anion gap: 11 (ref 5–15)
BUN: 6 mg/dL (ref 6–20)
CO2: 25 mmol/L (ref 22–32)
Calcium: 7.7 mg/dL — ABNORMAL LOW (ref 8.9–10.3)
Chloride: 101 mmol/L (ref 98–111)
Creatinine, Ser: 0.51 mg/dL — ABNORMAL LOW (ref 0.61–1.24)
GFR calc Af Amer: >60 mL/min (ref >60)
GFR calc non Af Amer: >60 mL/min (ref >60)
Glucose, Bld: 91 mg/dL (ref 70–99)
Potassium: 3.4 mmol/L — ABNORMAL LOW (ref 3.5–5.1)
Sodium: 137 mmol/L (ref 135–145)
Total Bilirubin: 5.1 mg/dL — ABNORMAL HIGH (ref 0.3–1.2)
Total Protein: 6.5 g/dL (ref 6.5–8.1)

## 2018-11-15 LAB — CBC
HCT: 41.1 % (ref 39.0–52.0)
Hemoglobin: 13.7 g/dL (ref 13.0–17.0)
MCH: 33.3 pg (ref 26.0–34.0)
MCHC: 33.3 g/dL (ref 30.0–36.0)
MCV: 99.8 fL (ref 80.0–100.0)
Platelets: 142 10*3/uL — ABNORMAL LOW (ref 150–400)
RBC: 4.12 MIL/uL — ABNORMAL LOW (ref 4.22–5.81)
RDW: 14.1 % (ref 11.5–15.5)
WBC: 11 10*3/uL — ABNORMAL HIGH (ref 4.0–10.5)
nRBC: 0.2 % (ref 0.0–0.2)

## 2018-11-15 LAB — TROPONIN I (HIGH SENSITIVITY): Troponin I (High Sensitivity): 10 ng/L (ref <18)

## 2018-11-15 LAB — MAGNESIUM: Magnesium: 1.6 mg/dL — ABNORMAL LOW (ref 1.7–2.4)

## 2018-11-15 LAB — HIV ANTIBODY (ROUTINE TESTING W REFLEX): HIV Screen 4th Generation wRfx: NONREACTIVE

## 2018-11-15 LAB — TSH: TSH: 1.227 u[IU]/mL (ref 0.350–4.500)

## 2018-11-15 MED ORDER — MAGNESIUM SULFATE 2 GM/50ML IV SOLN
2.0000 g | Freq: Once | INTRAVENOUS | Status: AC
  Administered 2018-11-15: 2 g via INTRAVENOUS
  Filled 2018-11-15: qty 50

## 2018-11-15 MED ORDER — CALCIUM CARBONATE ANTACID 500 MG PO CHEW: 1.0000 | CHEWABLE_TABLET | Freq: Two times a day (BID) | ORAL | 1 refills | Status: AC

## 2018-11-15 MED ORDER — MAGNESIUM OXIDE 400 (241.3 MG) MG PO TABS: 400.0000 mg | ORAL_TABLET | Freq: Two times a day (BID) | ORAL | 2 refills | Status: DC

## 2018-11-15 MED ORDER — CALCIUM CARBONATE ANTACID 500 MG PO CHEW
1.0000 | CHEWABLE_TABLET | Freq: Once | ORAL | Status: AC
  Administered 2018-11-15: 200 mg via ORAL
  Filled 2018-11-15: qty 1

## 2018-11-15 MED ORDER — MAGNESIUM OXIDE 400 (241.3 MG) MG PO TABS
400.0000 mg | ORAL_TABLET | Freq: Two times a day (BID) | ORAL | Status: DC
  Administered 2018-11-15: 400 mg via ORAL
  Filled 2018-11-15: qty 1

## 2018-11-15 MED ORDER — POTASSIUM CHLORIDE CRYS ER 20 MEQ PO TBCR
40.0000 meq | EXTENDED_RELEASE_TABLET | Freq: Once | ORAL | Status: AC
  Administered 2018-11-15: 40 meq via ORAL
  Filled 2018-11-15: qty 2

## 2018-11-15 NOTE — Discharge Summary (Signed)
Physician Discharge Summary  [name] ZYS:[id number] DOB: 07-16-1980 DOA: 11/14/2018  PCP: James Pepper, MD  Admit date: 11/14/2018 Discharge date: 11/15/2018  Admitted From: Home  Discharge disposition: home   Recommendations for Outpatient Follow-Up:    Follow up with your primary care provider in one week.    Discharge Diagnosis:   Principal Problem:   Tachycardia Active Problems:   Hypoalbuminemia   Hypokalemia   Hypocalcemia   Hypomagnesemia   Alcoholic cirrhosis of liver with ascites ([place])    Discharge Condition: Improved.  Diet recommendation:  Regular.  Wound care: None.  Code status: Full.   History of Present Illness:    [name]  is a 38 y.o. 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    Pt notes that he has been taking his lactulose. Since he has had 3 days off and had more bowel movement than normal.  Patient was noted to have hypokalemia and tachycardia so  was then admitted to hospital for observation and replacement of electrolytes.  Hospital Course:   Following conditions were addressed during hospitalization,  Cramping likely from hypokalemia.  Has been aggressively replenished.  We will continue potassium on discharge.  Sinus tachycardia Improved.  Patient was given potassium and magnesium during hospitalization.  Hypocalcemia Initial presentation was potassium of 2.2.  Patient was aggressively replenished while in the hospital.  No further cramping in the hospital.  We will also consider calcium supplements on discharge.  Hypomagnesemia Replenished.  Will be given a course on discharge.  Hypokalemia Replenished.  Patient will continue to replace on discharge.  Abnormal liver function, Cirrhosis of liver. Last RUQ ultrasound 10/22/2017.  Patient will follow-up with GI as outpatient.  Continue home medication  which include spironolactone, nadolol, rifaximin, lactulose and Lasix.  GERD Cont PPI.  Will prescribe Tums which will help for low calcium levels as well.  Anxiety Cont Zoloft  Severe protein calorie malnutrition, present on admission. Patient was advised adequate nutrition on discharge.  Disposition.  At this time, patient is stable for disposition home.  He will need to follow-up with his primary care physician as outpatient in 1 week.  Medical Consultants:    None.   Subjective:   Today, patient feels okay.  Denies any dizziness, lightheadedness, nausea vomiting but has mild heartburn.  No cramps.  Discharge Exam:   Vitals:   11/14/18 2137 11/15/18 0541  BP: (!) 124/97 (!) 124/97  Pulse: 98 (!) 105  Resp: 18 17  Temp: 98.4 F (36.9 C) 97.9 F (36.6 C)  SpO2: 98% 98%   Vitals:   11/14/18 2137 11/14/18 2139 11/15/18 0500 11/15/18 0541  BP: (!) 124/97   (!) 124/97  Pulse: 98   (!) 105  Resp: 18   17  Temp: 98.4 F (36.9 C)   97.9 F (36.6 C)  TempSrc: Oral   Oral  SpO2: 98%   98%  Weight:  59.4 kg 58.6 kg   Height:  5\' 7"  (1.702 m)      General exam: Appears calm and comfortable ,Not in distress, thinly built. HEENT:PERRL,Oral mucosa moist Respiratory system: Bilateral equal air entry, normal vesicular breath sounds, no wheezes or crackles  Cardiovascular system: S1 & S2 heard, RRR.  Gastrointestinal system: Abdomen is nondistended, soft and nontender. No organomegaly or masses felt. Normal bowel sounds heard. Central nervous system: Alert and oriented. No focal neurological  deficits. Extremities: No edema, no clubbing ,no cyanosis, distal peripheral pulses palpable. Skin: No rashes, lesions or ulcers,no icterus ,no pallor MSK: Normal muscle bulk,tone ,power    Procedures:    None  The results of significant diagnostics from this hospitalization (including imaging, microbiology, ancillary and laboratory) are listed below for reference.      Diagnostic Studies:   No results found.   Labs:   Basic Metabolic Panel: Recent Labs  Lab 11/14/18 1246 11/14/18 1846 11/15/18 0533  NA 136 136 137  K 2.2* 3.6 3.4*  CL 97* 100 101  CO2 23 23 25   GLUCOSE 109* 97 91  BUN 5* 5* 6  CREATININE 0.73 0.65 0.51*  CALCIUM 7.7* 7.2* 7.7*  MG 0.9*  --  1.6*   GFR Estimated Creatinine Clearance: 103.8 mL/min (A) (by C-G formula based on SCr of 0.51 mg/dL (L)). Liver Function Tests: Recent Labs  Lab 11/14/18 1246 11/14/18 2015 11/15/18 0533  AST 196* 195* 285*  ALT 77* 72* 84*  ALKPHOS 157* 138* 136*  BILITOT 5.5* 4.9* 5.1*  PROT 7.3 6.5 6.5  ALBUMIN 2.9* 2.6* 2.7*   No results for input(s): LIPASE, AMYLASE in the last 168 hours. No results for input(s): AMMONIA in the last 168 hours. Coagulation profile No results for input(s): INR, PROTIME in the last 168 hours.  CBC: Recent Labs  Lab 11/14/18 1212 11/15/18 0533  WBC 12.0* 11.0*  HGB 15.2 13.7  HCT 44.3 41.1  MCV 96.5 99.8  PLT 170 142*   Cardiac Enzymes: Recent Labs  Lab 11/14/18 2015  CKTOTAL 1,951*  CKMB 52.6*   BNP: Invalid input(s): POCBNP CBG: No results for input(s): GLUCAP in the last 168 hours. D-Dimer Recent Labs    11/14/18 2015  DDIMER 3.41*   Hgb A1c No results for input(s): HGBA1C in the last 72 hours. Lipid Profile No results for input(s): CHOL, HDL, LDLCALC, TRIG, CHOLHDL, LDLDIRECT in the last 72 hours. Thyroid function studies Recent Labs    11/15/18 0533  TSH 1.227   Anemia work up No results for input(s): VITAMINB12, FOLATE, FERRITIN, TIBC, IRON, RETICCTPCT in the last 72 hours. Microbiology Recent Results (from the past 240 hour(s))  SARS Coronavirus 2 (Performed in [facility] hospital lab)     Status: None   Collection Time: 11/14/18  2:22 PM   Specimen: Nasopharyngeal Swab  Result Value Ref Range Status   SARS Coronavirus 2 NEGATIVE NEGATIVE Final    Comment: (NOTE) If result is NEGATIVE SARS-CoV-2 target  nucleic acids are NOT DETECTED. The SARS-CoV-2 RNA is generally detectable in upper and lower  respiratory specimens during the acute phase of infection. The lowest  concentration of SARS-CoV-2 viral copies this assay can detect is 250  copies / mL. A negative result does not preclude SARS-CoV-2 infection  and should not be used as the sole basis for treatment or other  patient management decisions.  A negative result may occur with  improper specimen collection / handling, submission of specimen other  than nasopharyngeal swab, presence of viral mutation(s) within the  areas targeted by this assay, and inadequate number of viral copies  (<250 copies / mL). A negative result must be combined with clinical  observations, patient history, and epidemiological information. If result is POSITIVE SARS-CoV-2 target nucleic acids are DETECTED. The SARS-CoV-2 RNA is generally detectable in upper and lower  respiratory specimens dur ing the acute phase of infection.  Positive  results are indicative of active infection with SARS-CoV-2.  Clinical  correlation with patient history and other diagnostic information is  necessary to determine patient infection status.  Positive results do  not rule out bacterial infection or co-infection with other viruses. If result is PRESUMPTIVE POSTIVE SARS-CoV-2 nucleic acids MAY BE PRESENT.   A presumptive positive result was obtained on the submitted specimen  and confirmed on repeat testing.  While 2019 novel coronavirus  (SARS-CoV-2) nucleic acids may be present in the submitted sample  additional confirmatory testing may be necessary for epidemiological  and / or clinical management purposes  to differentiate between  SARS-CoV-2 and other Sarbecovirus currently known to infect humans.  If clinically indicated additional testing with an alternate test  methodology [phone]([id number]) is advised. The SARS-CoV-2 RNA is generally  detectable in upper and lower  respiratory sp ecimens during the acute  phase of infection. The expected result is Negative. Fact Sheet for Patients:  BoilerBrush.com.cyhttps://www.fda.gov/media/136312/download Fact Sheet for Healthcare Providers: https://pope.com/https://www.fda.gov/media/136313/download This test is not yet approved or cleared by the [place] FDA and has been authorized for detection and/or diagnosis of SARS-CoV-2 by FDA under an Emergency Use Authorization (EUA).  This EUA will remain in effect (meaning this test can be used) for the duration of the COVID-19 declaration under Section 564(b)(1) of the Act, 21 U.S.C. section 360bbb-3(b)(1), unless the authorization is terminated or revoked sooner. Performed at [facility] [place], [address] [place], [place] [address]      Discharge Instructions:   Discharge Instructions    Diet general   Complete by: As directed    Discharge instructions   Complete by: As directed    Follow-up with your primary care physician in 1 week.  Continue to take potassium and magnesium at home.  Check blood work at that time.  Increase fluid intake for the next 2 days.    Increase activity slowly   Complete by: As directed      Allergies as of 11/15/2018      Reactions   Penicillins Hives      Medication List     Allergies as of 11/15/2018      Reactions   Penicillins Hives      Medication List    TAKE these medications   calcium carbonate 500 MG chewable tablet Commonly known as: Tums Chew 1 tablet (200 mg of elemental calcium total) by mouth 2 (two) times daily.   furosemide 40 MG tablet Commonly known as: Lasix Take 1 tablet (40 mg total) by mouth daily.   IRON PO Take 1 tablet by mouth daily.   lactulose 10 GM/15ML solution Commonly known as: CHRONULAC Take 45 mLs (30 g total) by mouth 3 (three) times daily.   magnesium oxide 400 (241.3 Mg) MG tablet Commonly known as: MAG-OX Take 1 tablet (400 mg total) by mouth 2 (two) times daily.   nadolol 20 MG  tablet Commonly known as: CORGARD Take 20 mg by mouth daily.   omeprazole 20 MG capsule Commonly known as: PRILOSEC Take 20 mg by mouth daily.   potassium chloride SA 20 MEQ tablet Commonly known as: K-DUR Take 20 mEq by mouth 2 (two) times daily.   rifaximin 550 MG Tabs tablet Commonly known as: XIFAXAN Take 1 tablet (550 mg total) by mouth 3 (three) times daily. What changed: when to take this   sertraline 100 MG tablet Commonly known as: ZOLOFT Take 100 mg by mouth daily.   spironolactone 50 MG tablet Commonly known as: ALDACTONE Take 1 tablet (50 mg total)  by mouth daily.       Time coordinating discharge: 39 minutes  Signed:  Elgin Carn  [facility] 11/15/2018, 9:58 AM

## 2018-11-16 LAB — HEPATITIS PANEL, ACUTE
HCV Ab: 0.3 s/co ratio (ref 0.0–0.9)
Hep A IgM: NEGATIVE
Hep B C IgM: NEGATIVE
Hepatitis B Surface Ag: NEGATIVE

## 2019-07-16 ENCOUNTER — Other Ambulatory Visit: Payer: Self-pay

## 2019-07-16 ENCOUNTER — Encounter (HOSPITAL_COMMUNITY): Payer: Self-pay | Admitting: Emergency Medicine

## 2019-07-16 ENCOUNTER — Inpatient Hospital Stay (HOSPITAL_COMMUNITY)
Admission: EM | Admit: 2019-07-16 | Discharge: 2019-07-18 | DRG: 641 | Disposition: A | Discharge status: Home / Self Care | Payer: Self-pay | Attending: Family Medicine | Admitting: Family Medicine

## 2019-07-16 DIAGNOSIS — Z880 Allergy status to penicillin: Secondary | ICD-10-CM

## 2019-07-16 DIAGNOSIS — E872 Acidosis, unspecified: Secondary | ICD-10-CM | POA: Diagnosis present

## 2019-07-16 DIAGNOSIS — Z79899 Other long term (current) drug therapy: Secondary | ICD-10-CM

## 2019-07-16 DIAGNOSIS — R9431 Abnormal electrocardiogram [ECG] [EKG]: Secondary | ICD-10-CM | POA: Diagnosis present

## 2019-07-16 DIAGNOSIS — R000 Tachycardia, unspecified: Secondary | ICD-10-CM | POA: Diagnosis present

## 2019-07-16 DIAGNOSIS — Y903 Blood alcohol level of 60-79 mg/100 ml: Secondary | ICD-10-CM | POA: Diagnosis present

## 2019-07-16 DIAGNOSIS — D689 Coagulation defect, unspecified: Secondary | ICD-10-CM | POA: Diagnosis present

## 2019-07-16 DIAGNOSIS — K7030 Alcoholic cirrhosis of liver without ascites: Secondary | ICD-10-CM | POA: Diagnosis present

## 2019-07-16 DIAGNOSIS — Z87891 Personal history of nicotine dependence: Secondary | ICD-10-CM

## 2019-07-16 DIAGNOSIS — I8510 Secondary esophageal varices without bleeding: Secondary | ICD-10-CM | POA: Diagnosis present

## 2019-07-16 DIAGNOSIS — R110 Nausea: Secondary | ICD-10-CM | POA: Diagnosis present

## 2019-07-16 DIAGNOSIS — E878 Other disorders of electrolyte and fluid balance, not elsewhere classified: Secondary | ICD-10-CM | POA: Diagnosis present

## 2019-07-16 DIAGNOSIS — E876 Hypokalemia: Principal | ICD-10-CM | POA: Diagnosis present

## 2019-07-16 DIAGNOSIS — E7220 Disorder of urea cycle metabolism, unspecified: Secondary | ICD-10-CM | POA: Diagnosis present

## 2019-07-16 DIAGNOSIS — R197 Diarrhea, unspecified: Secondary | ICD-10-CM | POA: Diagnosis present

## 2019-07-16 DIAGNOSIS — E8809 Other disorders of plasma-protein metabolism, not elsewhere classified: Secondary | ICD-10-CM | POA: Diagnosis present

## 2019-07-16 DIAGNOSIS — F1010 Alcohol abuse, uncomplicated: Secondary | ICD-10-CM | POA: Diagnosis present

## 2019-07-16 DIAGNOSIS — K219 Gastro-esophageal reflux disease without esophagitis: Secondary | ICD-10-CM | POA: Diagnosis present

## 2019-07-16 DIAGNOSIS — Z20822 Contact with and (suspected) exposure to covid-19: Secondary | ICD-10-CM | POA: Diagnosis present

## 2019-07-16 LAB — CBC WITH DIFFERENTIAL/PLATELET
Abs Immature Granulocytes: 0.03 10*3/uL (ref 0.00–0.07)
Basophils Absolute: 0.1 10*3/uL (ref 0.0–0.1)
Basophils Relative: 2 %
Eosinophils Absolute: 0 10*3/uL (ref 0.0–0.5)
Eosinophils Relative: 1 %
HCT: 46.5 % (ref 39.0–52.0)
Hemoglobin: 15.4 g/dL (ref 13.0–17.0)
Immature Granulocytes: 1 %
Lymphocytes Relative: 11 %
Lymphs Abs: 0.6 10*3/uL — ABNORMAL LOW (ref 0.7–4.0)
MCH: 33.2 pg (ref 26.0–34.0)
MCHC: 33.1 g/dL (ref 30.0–36.0)
MCV: 100.2 fL — ABNORMAL HIGH (ref 80.0–100.0)
Monocytes Absolute: 0.7 10*3/uL (ref 0.1–1.0)
Monocytes Relative: 13 %
Neutro Abs: 4.2 10*3/uL (ref 1.7–7.7)
Neutrophils Relative %: 72 %
Platelets: 165 10*3/uL (ref 150–400)
RBC: 4.64 MIL/uL (ref 4.22–5.81)
RDW: 14.2 % (ref 11.5–15.5)
WBC: 5.7 10*3/uL (ref 4.0–10.5)
nRBC: 0 % (ref 0.0–0.2)

## 2019-07-16 LAB — PROTIME-INR
INR: 1.5 — ABNORMAL HIGH (ref 0.8–1.2)
Prothrombin Time: 18.4 seconds — ABNORMAL HIGH (ref 11.4–15.2)

## 2019-07-16 LAB — BASIC METABOLIC PANEL
Anion gap: 29 — ABNORMAL HIGH (ref 5–15)
Anion gap: 10 (ref 5–15)
BUN: <5 mg/dL — ABNORMAL LOW (ref 6–20)
BUN: <5 mg/dL — ABNORMAL LOW (ref 6–20)
CO2: 13 mmol/L — ABNORMAL LOW (ref 22–32)
CO2: 27 mmol/L (ref 22–32)
Calcium: 8.3 mg/dL — ABNORMAL LOW (ref 8.9–10.3)
Calcium: 7.7 mg/dL — ABNORMAL LOW (ref 8.9–10.3)
Chloride: 97 mmol/L — ABNORMAL LOW (ref 98–111)
Chloride: 102 mmol/L (ref 98–111)
Creatinine, Ser: 0.97 mg/dL (ref 0.61–1.24)
Creatinine, Ser: 0.73 mg/dL (ref 0.61–1.24)
GFR calc Af Amer: >60 mL/min (ref >60)
GFR calc Af Amer: >60 mL/min (ref >60)
GFR calc non Af Amer: >60 mL/min (ref >60)
GFR calc non Af Amer: >60 mL/min (ref >60)
Glucose, Bld: 183 mg/dL — ABNORMAL HIGH (ref 70–99)
Glucose, Bld: 125 mg/dL — ABNORMAL HIGH (ref 70–99)
Potassium: 2.9 mmol/L — ABNORMAL LOW (ref 3.5–5.1)
Potassium: 4.6 mmol/L (ref 3.5–5.1)
Sodium: 139 mmol/L (ref 135–145)
Sodium: 139 mmol/L (ref 135–145)

## 2019-07-16 LAB — MAGNESIUM: Magnesium: 1.3 mg/dL — ABNORMAL LOW (ref 1.7–2.4)

## 2019-07-16 LAB — HEPATIC FUNCTION PANEL
ALT: 58 U/L — ABNORMAL HIGH (ref 0–44)
AST: 169 U/L — ABNORMAL HIGH (ref 15–41)
Albumin: 3.4 g/dL — ABNORMAL LOW (ref 3.5–5.0)
Alkaline Phosphatase: 136 U/L — ABNORMAL HIGH (ref 38–126)
Bilirubin, Direct: 2.2 mg/dL — ABNORMAL HIGH (ref 0.0–0.2)
Indirect Bilirubin: 3.1 mg/dL — ABNORMAL HIGH (ref 0.3–0.9)
Total Bilirubin: 5.3 mg/dL — ABNORMAL HIGH (ref 0.3–1.2)
Total Protein: 8 g/dL (ref 6.5–8.1)

## 2019-07-16 LAB — LACTIC ACID, PLASMA
Lactic Acid, Venous: 9.7 mmol/L (ref 0.5–1.9)
Lactic Acid, Venous: 6.2 mmol/L (ref 0.5–1.9)
Lactic Acid, Venous: 2.8 mmol/L (ref 0.5–1.9)

## 2019-07-16 LAB — SARS CORONAVIRUS 2 (TAT 6-24 HRS): SARS Coronavirus 2: NEGATIVE

## 2019-07-16 LAB — RAPID URINE DRUG SCREEN, HOSP PERFORMED
Amphetamines: NOT DETECTED
Barbiturates: NOT DETECTED
Benzodiazepines: NOT DETECTED
Cocaine: NOT DETECTED
Opiates: NOT DETECTED
Tetrahydrocannabinol: POSITIVE — AB

## 2019-07-16 LAB — ETHANOL: Alcohol, Ethyl (B): 62 mg/dL — ABNORMAL HIGH (ref <10)

## 2019-07-16 LAB — AMMONIA: Ammonia: 72 umol/L — ABNORMAL HIGH (ref 9–35)

## 2019-07-16 LAB — CBG MONITORING, ED: Glucose-Capillary: 171 mg/dL — ABNORMAL HIGH (ref 70–99)

## 2019-07-16 LAB — APTT: aPTT: 31 seconds (ref 24–36)

## 2019-07-16 LAB — CK: Total CK: 98 U/L (ref 49–397)

## 2019-07-16 MED ORDER — LACTATED RINGERS IV BOLUS
1000.0000 mL | Freq: Once | INTRAVENOUS | Status: AC
  Administered 2019-07-16: 16:00:00 1000 mL via INTRAVENOUS

## 2019-07-16 MED ORDER — LORAZEPAM 2 MG/ML IJ SOLN: 1.0000 mg | Freq: Once | INTRAMUSCULAR | Status: DC

## 2019-07-16 MED ORDER — SODIUM CHLORIDE 0.9 % IV BOLUS
1000.0000 mL | Freq: Once | INTRAVENOUS | Status: AC
  Administered 2019-07-16: 1000 mL via INTRAVENOUS

## 2019-07-16 MED ORDER — ONDANSETRON HCL 4 MG PO TABS: 4.0000 mg | ORAL_TABLET | Freq: Four times a day (QID) | ORAL | Status: DC | PRN

## 2019-07-16 MED ORDER — MAGNESIUM OXIDE 400 (241.3 MG) MG PO TABS
400.0000 mg | ORAL_TABLET | Freq: Once | ORAL | Status: AC
  Administered 2019-07-16: 400 mg via ORAL
  Filled 2019-07-16: qty 1

## 2019-07-16 MED ORDER — LORAZEPAM 2 MG/ML IJ SOLN: 0.0000 mg | Freq: Two times a day (BID) | INTRAMUSCULAR | Status: DC

## 2019-07-16 MED ORDER — MAGNESIUM OXIDE 400 (241.3 MG) MG PO TABS
400.0000 mg | ORAL_TABLET | Freq: Two times a day (BID) | ORAL | Status: DC
  Administered 2019-07-17 – 2019-07-18 (×3): 400 mg via ORAL
  Filled 2019-07-16 (×3): qty 1

## 2019-07-16 MED ORDER — NADOLOL 20 MG PO TABS
20.0000 mg | ORAL_TABLET | Freq: Every day | ORAL | Status: DC
  Administered 2019-07-16 – 2019-07-18 (×3): 20 mg via ORAL
  Filled 2019-07-16 (×3): qty 1

## 2019-07-16 MED ORDER — LORAZEPAM 2 MG/ML IJ SOLN
2.0000 mg | Freq: Once | INTRAMUSCULAR | Status: AC
  Administered 2019-07-16: 15:00:00 2 mg via INTRAVENOUS
  Filled 2019-07-16: qty 1

## 2019-07-16 MED ORDER — ONDANSETRON HCL 4 MG/2ML IJ SOLN: 4.0000 mg | Freq: Four times a day (QID) | INTRAMUSCULAR | Status: DC | PRN

## 2019-07-16 MED ORDER — THIAMINE HCL 100 MG/ML IJ SOLN: 100.0000 mg | Freq: Every day | INTRAMUSCULAR | Status: DC

## 2019-07-16 MED ORDER — POTASSIUM CHLORIDE CRYS ER 20 MEQ PO TBCR
60.0000 meq | EXTENDED_RELEASE_TABLET | Freq: Once | ORAL | Status: AC
  Administered 2019-07-16: 60 meq via ORAL
  Filled 2019-07-16: qty 3

## 2019-07-16 MED ORDER — LORAZEPAM 1 MG PO TABS: 1.0000 mg | ORAL_TABLET | ORAL | Status: DC | PRN

## 2019-07-16 MED ORDER — SODIUM CHLORIDE 0.9 % IV SOLN: INTRAVENOUS | Status: DC

## 2019-07-16 MED ORDER — ENOXAPARIN SODIUM 40 MG/0.4ML ~~LOC~~ SOLN
40.0000 mg | SUBCUTANEOUS | Status: DC
  Administered 2019-07-16 – 2019-07-17 (×2): 40 mg via SUBCUTANEOUS
  Filled 2019-07-16 (×2): qty 0.4

## 2019-07-16 MED ORDER — LORAZEPAM 2 MG/ML IJ SOLN
0.0000 mg | Freq: Four times a day (QID) | INTRAMUSCULAR | Status: DC
  Administered 2019-07-16: 1 mg via INTRAVENOUS

## 2019-07-16 MED ORDER — THIAMINE HCL 100 MG PO TABS
100.0000 mg | ORAL_TABLET | Freq: Every day | ORAL | Status: DC
  Administered 2019-07-16 – 2019-07-18 (×3): 100 mg via ORAL
  Filled 2019-07-16 (×3): qty 1

## 2019-07-16 MED ORDER — CALCIUM GLUCONATE-NACL 1-0.675 GM/50ML-% IV SOLN
1.0000 g | Freq: Once | INTRAVENOUS | Status: AC
  Administered 2019-07-16: 1000 mg via INTRAVENOUS
  Filled 2019-07-16: qty 50

## 2019-07-16 MED ORDER — ALBUTEROL SULFATE (2.5 MG/3ML) 0.083% IN NEBU: 2.5000 mg | INHALATION_SOLUTION | Freq: Four times a day (QID) | RESPIRATORY_TRACT | Status: DC | PRN

## 2019-07-16 MED ORDER — ADULT MULTIVITAMIN W/MINERALS CH
1.0000 | ORAL_TABLET | Freq: Every day | ORAL | Status: DC
  Administered 2019-07-16 – 2019-07-18 (×3): 1 via ORAL
  Filled 2019-07-16 (×3): qty 1

## 2019-07-16 MED ORDER — LORAZEPAM 2 MG/ML IJ SOLN
1.0000 mg | INTRAMUSCULAR | Status: DC | PRN
  Filled 2019-07-16: qty 1

## 2019-07-16 MED ORDER — FOLIC ACID 1 MG PO TABS
1.0000 mg | ORAL_TABLET | Freq: Every day | ORAL | Status: DC
  Administered 2019-07-16 – 2019-07-18 (×3): 1 mg via ORAL
  Filled 2019-07-16 (×3): qty 1

## 2019-07-16 MED ORDER — LACTULOSE 10 GM/15ML PO SOLN
30.0000 g | Freq: Three times a day (TID) | ORAL | Status: DC
  Administered 2019-07-16 – 2019-07-18 (×5): 30 g via ORAL
  Filled 2019-07-16 (×5): qty 45

## 2019-07-16 NOTE — ED Provider Notes (Addendum)
[facility] EMERGENCY DEPARTMENT Provider Note   CSN: [id number] Arrival date & time: 07/16/19  1252     History Chief Complaint  Patient presents with   Seizures    ETOH    [name] is a 39 y.o. male.  Patient with history of liver cirrhosis on lactulose, esophageal varices, GERD --presents to the emergency department with complaint of spasms which began in his fingers and hands and move of his body.  Patient has had similar symptoms in the past when his electrolytes have been abnormal.  Patient had a hospitalization last year for tachycardia, hypomagnesemia, hypokalemia, hypocalcemia.  Patient denies any fevers or symptoms of illness.  He has had some nausea but no vomiting.  Typically he drinks Coca[employer]a and has poor oral intake.  He has 1 episode of watery diarrhea per day that he relates to his use of lactulose.  No chest pain, shortness of breath, or abdominal pain.  Patient has not had any tonic-clonic seizures or loss of consciousness with any episode of spasms.  He does admit to drinking alcohol yesterday.  Family at bedside states that he does get confused sometimes when he does not take his medications as he is prescribed but current mental status is near baseline.  No known Covid or sick contacts.        Past Medical History:  Diagnosis Date   Cirrhosis of liver ([place])    Esophageal varices with hemorrhage ([place])    ETOH abuse    GERD (gastroesophageal reflux disease)     Patient Active Problem List   Diagnosis Date Noted   Tachycardia 11/14/2018   Acute encephalopathy    Acute upper GI bleed    Ascites    Distended abdomen    Hemorrhagic shock ([place])    Encounter for palliative care    Goals of care, counseling/discussion    Encephalopathy acute 42/59/5638   Alcoholic cirrhosis of liver with ascites ([place])    Acute hypoxemic respiratory failure ([facility])    SBP (spontaneous bacterial peritonitis) ([place])    Hypocalcemia  08/21/2015   Hypomagnesemia 08/21/2015   Acute blood loss anemia 08/20/2015   Acute GI bleeding 08/20/2015   ETOH abuse 08/20/2015   Elevated INR 08/20/2015   Hypoalbuminemia 08/20/2015   Hypokalemia 08/20/2015    Past Surgical History:  Procedure Laterality Date   ESOPHAGOGASTRODUODENOSCOPY Left 08/21/2015   Procedure: ESOPHAGOGASTRODUODENOSCOPY (EGD);  Surgeon: Wilford Corner, MD;  Location: [facility];  Service: Endoscopy;  Laterality: Left;       Family History  Problem Relation Age of Onset   Peptic Ulcer Paternal Aunt    Crohn's disease Maternal Uncle    Heart attack Maternal Uncle    Heart attack Maternal Grandfather    Stroke Maternal Grandmother    Stroke Paternal Grandfather     Social History   Tobacco Use   Smoking status: Former Smoker    Packs/day: 1.00    Types: Cigarettes   Smokeless tobacco: Never Used  Substance Use Topics   Alcohol use: Not Currently   Drug use: Yes    Types: Marijuana    Home Medications Prior to Admission medications   Medication Sig Start Date End Date Taking? Authorizing Provider  furosemide (LASIX) 40 MG tablet Take 1 tablet (40 mg total) by mouth daily. 09/08/15   Donne Hazel, MD  IRON PO Take 1 tablet by mouth daily.    [provider]  lactulose (CHRONULAC) 10 GM/15ML solution Take 45 mLs (30  g total) by mouth 3 (three) times daily. 09/08/15   Jerald Kief, MD  magnesium oxide (MAG-OX) 400 (241.3 Mg) MG tablet Take 1 tablet (400 mg total) by mouth 2 (two) times daily. 11/15/18   Pokhrel, Laxman, MD  nadolol (CORGARD) 20 MG tablet Take 20 mg by mouth daily. 05/23/18   [provider]  omeprazole (PRILOSEC) 20 MG capsule Take 20 mg by mouth daily.    [provider]  potassium chloride SA (K-DUR,KLOR-CON) 20 MEQ tablet Take 20 mEq by mouth 2 (two) times daily. 05/26/18   [provider]  rifaximin (XIFAXAN) 550 MG TABS tablet Take 1 tablet (550 mg total) by mouth 3  (three) times daily. Patient taking differently: Take 550 mg by mouth daily.  09/08/15   Jerald Kief, MD  sertraline (ZOLOFT) 100 MG tablet Take 100 mg by mouth daily.  05/18/18   [provider]  spironolactone (ALDACTONE) 50 MG tablet Take 1 tablet (50 mg total) by mouth daily. Patient not taking: Reported on 05/26/2018 09/08/15   Jerald Kief, MD    Allergies    Penicillins  Review of Systems   Review of Systems  Constitutional: Negative for chills and fever.  HENT: Negative for rhinorrhea and sore throat.   Eyes: Negative for redness.  Respiratory: Negative for cough and shortness of breath.   Cardiovascular: Negative for chest pain.  Gastrointestinal: Positive for diarrhea and nausea. Negative for abdominal pain and vomiting.  Genitourinary: Negative for dysuria.  Musculoskeletal: Positive for myalgias.  Skin: Negative for rash.  Neurological: Negative for headaches.  Psychiatric/Behavioral: Negative for confusion.    Physical Exam Updated Vital Signs BP 138/89    Pulse (!) 119    Temp 98 F (36.7 C) (Oral)    Resp 15    Ht 5\' 7"  (1.702 m)    Wt 61.2 kg    SpO2 97%    BMI 21.14 kg/m   Physical Exam Vitals and nursing note reviewed.  Constitutional:      Appearance: He is well-developed.  HENT:     Head: Normocephalic and atraumatic.  Eyes:     Conjunctiva/sclera: Conjunctivae normal.  Cardiovascular:     Rate and Rhythm: Tachycardia present.  Pulmonary:     Effort: No respiratory distress.  Musculoskeletal:     Cervical back: Normal range of motion and neck supple.  Skin:    General: Skin is warm and dry.  Neurological:     Mental Status: He is alert and oriented to person, place, and time.     Sensory: Sensation is intact.     Motor: Tremor present. No weakness.     Comments: + asterixis     ED Results / Procedures / Treatments   Labs (all labs ordered are listed, but only abnormal results are displayed) Labs Reviewed  CBC WITH  DIFFERENTIAL/PLATELET - Abnormal; Notable for the following components:      Result Value   MCV 100.2 (*)    Lymphs Abs 0.6 (*)    All other components within normal limits  BASIC METABOLIC PANEL - Abnormal; Notable for the following components:   Potassium 2.9 (*)    Chloride 97 (*)    CO2 13 (*)    Glucose, Bld 183 (*)    BUN <5 (*)    Calcium 8.3 (*)    Anion gap 29 (*)    All other components within normal limits  ETHANOL - Abnormal; Notable for the following components:  Alcohol, Ethyl (B) 62 (*)    All other components within normal limits  RAPID URINE DRUG SCREEN, HOSP PERFORMED - Abnormal; Notable for the following components:   Tetrahydrocannabinol POSITIVE (*)    All other components within normal limits  MAGNESIUM - Abnormal; Notable for the following components:   Magnesium 1.3 (*)    All other components within normal limits  HEPATIC FUNCTION PANEL - Abnormal; Notable for the following components:   Albumin 3.4 (*)    AST 169 (*)    ALT 58 (*)    Alkaline Phosphatase 136 (*)    Total Bilirubin 5.3 (*)    Bilirubin, Direct 2.2 (*)    Indirect Bilirubin 3.1 (*)    All other components within normal limits  AMMONIA - Abnormal; Notable for the following components:   Ammonia 72 (*)    All other components within normal limits  LACTIC ACID, PLASMA - Abnormal; Notable for the following components:   Lactic Acid, Venous 9.7 (*)    All other components within normal limits  CBG MONITORING, ED - Abnormal; Notable for the following components:   Glucose-Capillary 171 (*)    All other components within normal limits  SARS CORONAVIRUS 2 (TAT 6-24 HRS)  CK  PROTIME-INR  APTT    EKG EKG Interpretation  Date/Time:  Saturday July 16 2019 13:00:50 EDT Ventricular Rate:  122 PR Interval:    QRS Duration: 88 QT Interval:  348 QTC Calculation: 496 R Axis:   -25 Text Interpretation: Sinus tachycardia Borderline left axis deviation Borderline repolarization  abnormality Borderline prolonged QT interval No STEMI Confirmed by Alvester Chou [phone]) on 07/16/2019 1:21:57 PM   Radiology No results found.  Procedures Procedures (including critical care time)  Medications Ordered in ED Medications  nadolol (CORGARD) tablet 20 mg (has no administration in time range)  sodium chloride 0.9 % bolus 1,000 mL (0 mLs Intravenous Stopped 07/16/19 1451)  potassium chloride SA (KLOR-CON) CR tablet 60 mEq (60 mEq Oral Given 07/16/19 1507)  magnesium oxide (MAG-OX) tablet 400 mg (400 mg Oral Given 07/16/19 1507)  LORazepam (ATIVAN) injection 2 mg (2 mg Intravenous Given 07/16/19 1506)  lactated ringers bolus 1,000 mL (1,000 mLs Intravenous New Bag/Given 07/16/19 1538)      ED Course  I have reviewed the triage vital signs and the nursing notes.  Pertinent labs & imaging results that were available during my care of the patient were reviewed by me and considered in my medical decision making (see chart for details).  Patient seen and examined. Work-up initiated. Medications ordered.   Vital signs reviewed and are as follows: BP (!) 139/95    Pulse (!) 132    Temp 98 F (36.7 C) (Oral)    Resp 19    Ht 5\' 7"  (1.702 m)    Wt 61.2 kg    SpO2 100%    BMI 21.14 kg/m   EtOH level elevated, suspect more than one shot. Tachycardia worsened. CIWA check and ativan ordered.   4:04 PM milder hypokalemia, hypomagnesemia than in the past.  Patient was noted to have elevated anion gap at 29.  CIWA equals 11.  Patient treated with 2 mg IV Ativan for tremulousness and tachycardia.  Will continue hydration.  Lactate ordered.  Lactate returned at > 9. Patient discussed with and seen by Dr. .   Patient does not have any symptoms of infection, elevated white blood cell count, fever to suggest sepsis as cause of elevated lactate.  Do not  feel that antibiotics need to be started given lack of suspected source.  4:20 PM Pt seen by Dr. Renaye Rakers. Will need admission for  elevated lactate, hydration, correction of electrolytes. Pt agrees. PCP with [facility].   4:40 PM Spoke with Dr. Katrinka Blazing who will see.   Clinical Course as of Jul 15 1621  Sat Jul 16, 2019  1614 Patient seen by myself as well as PA provider.  39 year old with history of chronic alcohol use presenting to emergency department with weakness and tremulousness.  Father was concerned about seizure type activity, although the patient denies that he was seizing today.  Father stated that he found at the bottles of alcohol in the patient's room.  Patient denies any has been drinking other than "single shot" today.  Patient tells me his not been compliant with any of his other medications the past few days including his nadolol or potassium.  He does not feel like he is withdrawing.  In the room the patient is tachycardic with heart rate around 150 bpm and regular.  His blood pressure stable.  He does appear somewhat tremulous to me.  He does not feel like he is withdrawing but does have shakiness of the hands.  He was given a dose of Ativan.  We will give him a dose of nadolol as well.  He does not appear grossly toxic needed to me at this time.  He does have several electrolyte abnormalities including hypokalemia.  His lactate is significantly elevated which could be consistent with a seizure.  He is afebrile with a normal white blood cell count, and I have a much lower concern for sepsis at this time.   [MT]    Clinical Course User Index [MT] Trifan, Kermit Balo, MD   MDM Rules/Calculators/A&P                      Admit.    Final Clinical Impression(s) / ED Diagnoses Final diagnoses:  Lactic acidosis  Hypomagnesemia  Hypokalemia    Rx / DC Orders ED Discharge Orders    None        Renne Crigler, PA-C 07/16/19 1641    Terald Sleeper, MD 07/16/19 [phone]

## 2019-07-16 NOTE — H&P (Signed)
History and Physical    [name] BXU:[id number] DOB: [birth year] DOA: 07/16/2019  Referring MD/NP/PA: Estevan Oaks, PA-C PCP: Farris Has, MD  Patient coming from: Home  Chief Complaint: Hands and legs seizing up  I have personally briefly reviewed patient's old medical records in [facility]   HPI: [name] is a 39 y.o. 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  He further describes it as a spasming of his upper and lower extremities.  Reports a similar episode occurring last year where he had significant electrolyte derangements.  Associated symptoms included nausea, vomiting, and tremor.  Denied any blood in emesis.  He admitted to drinking a bottle of alcohol last night, but states that otherwise he had been refraining from any alcohol use.  Denies having any fever, recent sick contacts, dark stools, chest pain, abdominal pain, abdominal swelling, or shortness of breath.  Otherwise he reports that he has been taking his medications as advised.  ED Course: Upon admission into the emergency department patient was found to be afebrile, pulse 99-1 42, respirations 13-25, and all other vital signs maintained.  Labs significant for potassium 2.9, chloride 97, CO2 13, creatinine 0.97, glucose 183, anion gap 29, magnesium 1.3, alkaline phosphatase 136, albumin 3.4, AST 169, ALT 58, total bilirubin 5.3, lactic acid 9.7, ammonia level 72, and alcohol level 62.  Patient had been given 400 mg of magnesium oxide once, 2 mg Ativan IV, 20 mg of nadolol, 60 mEq of potassium chloride and 2 L of IV fluids.  There is called to admit  Review of Systems  Constitutional: Negative for fever.  HENT: Negative for ear pain and sinus pain.   Eyes: Negative for photophobia and pain.  Respiratory: Negative for cough and shortness of breath.   Cardiovascular:  Negative for chest pain and claudication.  Gastrointestinal: Positive for nausea and vomiting. Negative for abdominal pain.  Genitourinary: Negative for dysuria.  Musculoskeletal: Positive for myalgias.  Skin: Negative for itching.  Neurological: Positive for tremors. Negative for loss of consciousness.  Psychiatric/Behavioral: Positive for substance abuse.    Past Medical History:  Diagnosis Date  . Cirrhosis of liver (HCC)   . Esophageal varices with hemorrhage (HCC)   . ETOH abuse   . GERD (gastroesophageal reflux disease)     Past Surgical History:  Procedure Laterality Date  . ESOPHAGOGASTRODUODENOSCOPY Left 08/21/2015   Procedure: ESOPHAGOGASTRODUODENOSCOPY (EGD);  Surgeon: Charlott Rakes, MD;  Location: [facility];  Service: Endoscopy;  Laterality: Left;     reports that he has quit smoking. His smoking use included cigarettes. He smoked 1.00 pack per day. He has never used smokeless tobacco. He reports previous alcohol use. He reports current drug use. Drug: Marijuana.  Allergies  Allergen Reactions  . Penicillins Hives    Family History  Problem Relation Age of Onset  . Peptic Ulcer Paternal Aunt   . Crohn's disease Maternal Uncle   . Heart attack Maternal Uncle   . Heart attack Maternal Grandfather   . Stroke Maternal Grandmother   . Stroke Paternal Grandfather     Prior to Admission medications   Medication Sig Start Date End Date Taking? Authorizing Provider  furosemide (LASIX) 40 MG tablet Take 1 tablet (40 mg total) by mouth daily. 09/08/15   Jerald Kief, MD  IRON PO Take 1 tablet by mouth daily.    [provider]  lactulose (CHRONULAC) 10 GM/15ML solution Take 45 mLs (30 g total) by mouth 3 (three) times daily. 09/08/15   Donne Hazel, MD  magnesium oxide (MAG-OX) 400 (241.3 Mg) MG tablet Take 1 tablet (400 mg total) by mouth 2 (two) times daily. 11/15/18   Pokhrel, Laxman, MD  nadolol (CORGARD) 20 MG tablet Take 20 mg by mouth daily.  05/23/18   [provider]  omeprazole (PRILOSEC) 20 MG capsule Take 20 mg by mouth daily.    [provider]  potassium chloride SA (K-DUR,KLOR-CON) 20 MEQ tablet Take 20 mEq by mouth 2 (two) times daily. 05/26/18   [provider]  rifaximin (XIFAXAN) 550 MG TABS tablet Take 1 tablet (550 mg total) by mouth 3 (three) times daily. Patient taking differently: Take 550 mg by mouth daily.  09/08/15   Donne Hazel, MD  sertraline (ZOLOFT) 100 MG tablet Take 100 mg by mouth daily.  05/18/18   [provider]  spironolactone (ALDACTONE) 50 MG tablet Take 1 tablet (50 mg total) by mouth daily. Patient not taking: Reported on 05/26/2018 09/08/15   Donne Hazel, MD    Physical Exam:  Constitutional: Middle-age male who appears to be in some distress Vitals:   07/16/19 1515 07/16/19 1600 07/16/19 1602 07/16/19 1630  BP: 139/89 140/86 140/86 138/86  Pulse: (!) 117 (!) 134 (!) 129 (!) 129  Resp: 20 16 19  (!) 22  Temp:      TempSrc:      SpO2: 97% 98% 99% 99%  Weight:      Height:       Eyes: Scleral icterus appreciated ENMT: Mucous membranes are moist. Posterior pharynx clear of any exudate or lesions.  Neck: normal, supple, no masses, no thyromegaly Respiratory: clear to auscultation bilaterally, no wheezing, no crackles. Normal respiratory effort. No accessory muscle use.  Cardiovascular: Tachycardic, no murmurs / rubs / gallops. No extremity edema. 2+ pedal pulses. No carotid bruits.  Abdomen: no tenderness, no masses palpated. No hepatosplenomegaly. Bowel sounds positive.  Musculoskeletal: no clubbing / cyanosis. No joint deformity upper and lower extremities. Good ROM, no contractures. Normal muscle tone.  Skin: no rashes, lesions, ulcers. No induration Neurologic: CN 2-12 grossly intact. Sensation intact, DTR normal. Strength 5/5 in all 4.  Tremor present. Psychiatric: Normal judgment and insight. Alert and oriented x 3. Normal mood.     Labs on  Admission: I have personally reviewed following labs and imaging studies  CBC: Recent Labs  Lab 07/16/19 1329  WBC 5.7  NEUTROABS 4.2  HGB 15.4  HCT 46.5  MCV 100.2*  PLT 109   Basic Metabolic Panel: Recent Labs  Lab 07/16/19 1329  NA 139  K 2.9*  CL 97*  CO2 13*  GLUCOSE 183*  BUN <5*  CREATININE 0.97  CALCIUM 8.3*  MG 1.3*   GFR: Estimated Creatinine Clearance: 88.5 mL/min (by C-G formula based on SCr of 0.97 mg/dL). Liver Function Tests: Recent Labs  Lab 07/16/19 1329  AST 169*  ALT 58*  ALKPHOS 136*  BILITOT 5.3*  PROT 8.0  ALBUMIN 3.4*   No results for input(s): LIPASE, AMYLASE in the last 168 hours. Recent Labs  Lab 07/16/19 1328  AMMONIA 72*   Coagulation Profile: No results for input(s): INR, PROTIME in the last 168 hours. Cardiac Enzymes: Recent Labs  Lab 07/16/19 1329  CKTOTAL 98   BNP (last 3 results) No results for input(s): PROBNP in the last 8760 hours. HbA1C: No results for  input(s): HGBA1C in the last 72 hours. CBG: Recent Labs  Lab 07/16/19 1304  GLUCAP 171*   Lipid Profile: No results for input(s): CHOL, HDL, LDLCALC, TRIG, CHOLHDL, LDLDIRECT in the last 72 hours. Thyroid Function Tests: No results for input(s): TSH, T4TOTAL, FREET4, T3FREE, THYROIDAB in the last 72 hours. Anemia Panel: No results for input(s): VITAMINB12, FOLATE, FERRITIN, TIBC, IRON, RETICCTPCT in the last 72 hours. Urine analysis:    Component Value Date/Time   COLORURINE ORANGE (A) 09/04/2015 1300   APPEARANCEUR CLEAR 09/04/2015 1300   LABSPEC 1.030 09/04/2015 1300   PHURINE 8.5 (H) 09/04/2015 1300   GLUCOSEU NEGATIVE 09/04/2015 1300   HGBUR NEGATIVE 09/04/2015 1300   BILIRUBINUR LARGE (A) 09/04/2015 1300   KETONESUR NEGATIVE 09/04/2015 1300   PROTEINUR NEGATIVE 09/04/2015 1300   NITRITE POSITIVE (A) 09/04/2015 1300   LEUKOCYTESUR SMALL (A) 09/04/2015 1300   Sepsis Labs: No results found for this or any previous visit (from the past 240  hour(s)).   Radiological Exams on Admission: No results found.  EKG: Independently reviewed.  Sinus tachycardia 122 bpm  Assessment/Plan Electrolyte disturbance: As seen below -Admit to a progressive bed  Hypokalemia: Acute.  On admission initial potassium noted to be 2.9.  Patient was given 60 mEq of potassium chloride p.o. Home medications include potassium chloride 20 mEq twice daily. -Continue home regimen of potassium chloride twice daily  Hypomagnesemia: Acute.  On admission magnesium level noted to be 1.3.  Patient was given 400 mg of magnesium oxide p.o. -Give 2 g of magnesium sulfate IV  -Continue magnesium oxide 400 mg twice daily in a.m. -Continue to monitor and replace as needed  Hypocalcemia: Calcium just mildly low at 8.8 when corrected for albumin. -Give 1 g of calcium gluconate -Continue to monitor and replace as needed  Lactic acidosis, metabolic acidosis with elevated anion: Acute.  Lactic elevated at 9.7 on admission with CO2 13, anion gap 29, Glucose 189.  Question if patient may have actually had a seizure as a cause for the significantly elevated lactic acidosis as seems likely related to hyperglycemia. Could also be related to his alcohol use. -Check urinalysis -Trend lactic acid levels -Recheck BMP  Hyperammonemia: Acute on chronic.  On admission patient noted to have elevated ammonia level of 72.  Appears otherwise alert and oriented x3 at this time home medications include lactulose 30 g 3 times daily -Continue current lactulose regimen -Recheck ammonia level in a.m.  Alcohol abuse: Patient reports only drinking a bottle of alcohol last night, but unclear if this is true.  Initial alcohol level noted to be 62.  Vital signs including the tachycardia given concern for possible alcohol withdrawals. -CIWA protocols with scheduled Ativan   Alcoholic liver cirrhosis with esophageal varices, coagulopathy: No reports of bleeding, but INR elevated at 1.5.  Meld-Na  score 14 -Hold Lasix for now -Restart when medically appropriate  Elevated liver enzymes: Chronic.  AST to ALT ratio still suggestive of alcohol use. -Continue to monitor  Hyperbilirubinemia: Chronic.  Total bilirubin 5.3 which appears near patient's baseline. -Continue to monitor  Prolonged QT interval: Initial QTc noted to be 496. -Recheck QTC in a.m. -Avoid QT prolonging medications  GERD: Home medications include PPI -Restart when medically appropriate   DVT prophylaxis: Lovenox Code Status: Full Family Communication: No family requested to be updated at this time Disposition Plan: Likely discharge home once medically Consults called: none Admission status: Inpatient   Clydie Braun MD [facility] Pager [phone]   If 7PM-7AM,  please contact night-coverage [email or web address] Password [facility]  07/16/2019, 4:35 PM

## 2019-07-16 NOTE — ED Notes (Signed)
Josh PA notified of pt critical lactic acid.

## 2019-07-16 NOTE — ED Triage Notes (Signed)
PT presents to the ED by [facility] -reports that father took him to a fire station because he was having seizures. -Fires describes patient as "contracting" -Father also states that he found "empty bottles of alcohol" in the patient's room; also said he was supposed to be taking potassium meds and has not been adherent.  -PT was alert, oriented X 4, with tremors on arrival; endorsed drinking 1 shot of alcohol last night

## 2019-07-17 LAB — URINALYSIS, ROUTINE W REFLEX MICROSCOPIC
Bilirubin Urine: NEGATIVE
Glucose, UA: NEGATIVE mg/dL
Hgb urine dipstick: NEGATIVE
Ketones, ur: NEGATIVE mg/dL
Leukocytes,Ua: NEGATIVE
Nitrite: NEGATIVE
Protein, ur: NEGATIVE mg/dL
Specific Gravity, Urine: 1.011 (ref 1.005–1.030)
pH: 8 (ref 5.0–8.0)

## 2019-07-17 LAB — MAGNESIUM: Magnesium: 1 mg/dL — ABNORMAL LOW (ref 1.7–2.4)

## 2019-07-17 LAB — COMPREHENSIVE METABOLIC PANEL
ALT: 44 U/L (ref 0–44)
AST: 126 U/L — ABNORMAL HIGH (ref 15–41)
Albumin: 2.7 g/dL — ABNORMAL LOW (ref 3.5–5.0)
Alkaline Phosphatase: 107 U/L (ref 38–126)
Anion gap: 11 (ref 5–15)
BUN: <5 mg/dL — ABNORMAL LOW (ref 6–20)
CO2: 27 mmol/L (ref 22–32)
Calcium: 8.5 mg/dL — ABNORMAL LOW (ref 8.9–10.3)
Chloride: 100 mmol/L (ref 98–111)
Creatinine, Ser: 0.68 mg/dL (ref 0.61–1.24)
GFR calc Af Amer: >60 mL/min (ref >60)
GFR calc non Af Amer: >60 mL/min (ref >60)
Glucose, Bld: 98 mg/dL (ref 70–99)
Potassium: 3.4 mmol/L — ABNORMAL LOW (ref 3.5–5.1)
Sodium: 138 mmol/L (ref 135–145)
Total Bilirubin: 4.5 mg/dL — ABNORMAL HIGH (ref 0.3–1.2)
Total Protein: 6.7 g/dL (ref 6.5–8.1)

## 2019-07-17 LAB — CBC
HCT: 38.2 % — ABNORMAL LOW (ref 39.0–52.0)
Hemoglobin: 13.1 g/dL (ref 13.0–17.0)
MCH: 33.1 pg (ref 26.0–34.0)
MCHC: 34.3 g/dL (ref 30.0–36.0)
MCV: 96.5 fL (ref 80.0–100.0)
Platelets: 100 10*3/uL — ABNORMAL LOW (ref 150–400)
RBC: 3.96 MIL/uL — ABNORMAL LOW (ref 4.22–5.81)
RDW: 14.2 % (ref 11.5–15.5)
WBC: 7.1 10*3/uL (ref 4.0–10.5)
nRBC: 0 % (ref 0.0–0.2)

## 2019-07-17 LAB — AMMONIA: Ammonia: 52 umol/L — ABNORMAL HIGH (ref 9–35)

## 2019-07-17 LAB — PHOSPHORUS: Phosphorus: 1.9 mg/dL — ABNORMAL LOW (ref 2.5–4.6)

## 2019-07-17 MED ORDER — PANTOPRAZOLE SODIUM 40 MG PO TBEC
40.0000 mg | DELAYED_RELEASE_TABLET | Freq: Every day | ORAL | Status: DC
  Administered 2019-07-17 – 2019-07-18 (×2): 40 mg via ORAL
  Filled 2019-07-17 (×2): qty 1

## 2019-07-17 MED ORDER — POTASSIUM CHLORIDE CRYS ER 20 MEQ PO TBCR
30.0000 meq | EXTENDED_RELEASE_TABLET | ORAL | Status: AC
  Administered 2019-07-17 (×2): 30 meq via ORAL
  Filled 2019-07-17 (×2): qty 1

## 2019-07-17 MED ORDER — MAGNESIUM SULFATE 4 GM/100ML IV SOLN
4.0000 g | Freq: Once | INTRAVENOUS | Status: AC
  Administered 2019-07-17: 4 g via INTRAVENOUS
  Filled 2019-07-17: qty 100

## 2019-07-17 MED ORDER — INFLUENZA VAC SPLIT QUAD 0.5 ML IM SUSY
0.5000 mL | PREFILLED_SYRINGE | INTRAMUSCULAR | Status: DC
  Filled 2019-07-17: qty 0.5

## 2019-07-17 NOTE — Progress Notes (Signed)
PROGRESS NOTE    [name]  DZH:[id number] DOB: 09/20/1980 DOA: 07/16/2019 PCP: London Pepper, MD    Brief Narrative:  [name]  is a 39 y.o. 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  He further describes it as a spasming of his upper and lower extremities.  Reports a similar episode occurring last year where he had significant electrolyte derangements.  Associated symptoms included nausea, vomiting, and tremor.  Denied any blood in emesis.  He admitted to drinking a bottle of alcohol last night, but states that otherwise he had been refraining from any alcohol use.  Denies having any fever, recent sick contacts, dark stools, chest pain, abdominal pain, abdominal swelling, or shortness of breath.  Otherwise he reports that he has been taking his medications as advised.  In the emergency department, patient was found to be afebrile, pulse 99-1 42, respirations 13-25, and all other vital signs maintained.  Labs significant for potassium 2.9, chloride 97, CO2 13, creatinine 0.97, glucose 183, anion gap 29, magnesium 1.3, alkaline phosphatase 136, albumin 3.4, AST 169, ALT 58, total bilirubin 5.3, lactic acid 9.7, ammonia level 72, and alcohol level 62.  Patient had been given 400 mg of magnesium oxide once, 2 mg Ativan IV, 20 mg of nadolol, 60 mEq of potassium chloride and 2 L of IV fluids.  EDP consulted [facility] for admission.   Assessment & Plan:   Active Problems:   ETOH abuse   Hypokalemia   Hypocalcemia   Hypomagnesemia   Alcoholic cirrhosis (HCC)   Electrolyte disturbance   Lactic acidosis   Hyperammonemia (HCC)   Hyperbilirubinemia   Hypokalemia: Acute   Patient presenting with potassium 2.9; on potassium chloride 20 mEq p.o. twice daily at home.  Was repleted aggressively with potassium chloride 60 mEq initially. --Potassium 3.4 this  morning, will replete with 60 mEq of potassium today --Continue monitor electrolytes closely  Hypomagnesemia: Acute On admission magnesium level noted to be 1.3, and was repleted with IV and p.o. magnesium. --Magnesium 1.0 this morning, will replete with 4 g IV magnesium --Continue magnesium oxide 400 mg BID --Continue to monitor and replace as needed  Hypocalcemia:  Calcium just mildly low at 8.8 when corrected for albumin, received 1 g calcium gluconate on presentation. --Calcium 8.5 today, corrected for hypoalbuminemia is 9.5. --Continue to monitor and replace as needed  Lactic acidosis, metabolic acidosis with elevated anion: Acute.   Lactic elevated at 9.7 on admission with CO2 13, anion gap 29, Glucose 189.  Question if patient may have actually had a seizure as a cause for the significantly elevated lactic acidosis as seems likely related to hyperglycemia. Could also be related to his alcohol use.  Urinalysis unrevealing.  Lactic acid improved to 2.8. --Repeat lactic acid and BMP in a.m.  Hyperammonemia: Acute on chronic.   On admission patient noted to have elevated ammonia level of 72.  Appears otherwise alert and oriented x3 at this time home medications include lactulose 30 g 3 times daily --Continue current lactulose regimen; goal 2-3 soft BMs daily --Recheck ammonia level in a.m.  Alcohol abuse:  Patient reports only drinking a bottle of alcohol last night, but unclear if this is true.  Initial alcohol level noted to be 62.  Vital signs including the tachycardia given concern for possible alcohol withdrawals. --CIWA protocols with Ativan  --Folic acid, multivitamin, thiamine  Alcoholic liver cirrhosis with esophageal varices, coagulopathy:  No reports of bleeding, but INR elevated at 1.5.  Meld-Na score 14. Follows with GI Dr. Michail Sermon outpatient. --Continue nadolol 20 mg p.o. daily --Hold Lasix for now  Elevated liver enzymes: Chronic.  AST to ALT ratio still  suggestive of alcohol use. --Continue to monitor  Hyperbilirubinemia: Chronic.   Total bilirubin 5.3 which appears near patient's baseline. --Tbili 5.3-->4.5 --Continue to monitor  Prolonged QT interval: Initial QTc noted to be 496. --Avoid QT prolonging medications  GERD: continue PPI   DVT prophylaxis: Lovenox Code Status: Full code Family Communication: Updated patient extensively at bedside Disposition Plan:      Patient is from: Home with parents     Anticipated Disposition:  Home with parents     Barriers to discharge or conditions that needs to be met prior to discharge: Once electrolyte disturbances have resolved  Consultants:   None  Procedures:   None  Antimicrobials:   None   Subjective: Patient seen and examined bedside, resting comfortably.  Weakness and muscle twitching has improved, but not yet back to his baseline.  No specific complaints this morning.  Denies headache, no fever/chills/night sweats, no nausea/vomiting/diarrhea, no chest pain, no palpitation, no shortness of breath, no abdominal pain, no fatigue, no paresthesias.  No acute events overnight per nursing staff.  Objective: Vitals:   07/17/19 0324 07/17/19 0900 07/17/19 1136 07/17/19 1150  BP: 138/84   122/85  Pulse: 89 72 71   Resp: _0 Temp: 97.9 F (36.6 C) 98 F (36.7 C) 98 F (36.7 C)   TempSrc: Oral Oral Oral   SpO2: 99% 100% 100%   Weight: 55.5 kg     Height:        Intake/Output Summary (Last 24 hours) at 07/17/2019 1245 Last data filed at 07/17/2019 0900 Gross per 24 hour  Intake 1457.62 ml  Output 150 ml  Net 1307.62 ml   Filed Weights   07/16/19 1309 07/17/19 0324  Weight: 61.2 kg 55.5 kg    Examination:  General exam: Appears calm and comfortable  Respiratory system: Clear to auscultation. Respiratory effort normal. Cardiovascular system: S1 & S2 heard, RRR. No JVD, murmurs, rubs, gallops or clicks. No pedal edema. Gastrointestinal system: Abdomen  is nondistended, soft and nontender. No organomegaly or masses felt. Normal bowel sounds heard. Central nervous system: Alert and oriented. No focal neurological deficits. Extremities: Symmetric 5 x 5 power. Skin: No rashes, lesions or ulcers Psychiatry: Judgement and insight appear normal. Mood & affect appropriate.     Data Reviewed: I have personally reviewed following labs and imaging studies  CBC: Recent Labs  Lab 07/16/19 1329 07/17/19 0318  WBC 5.7 7.1  NEUTROABS 4.2  --   HGB 15.4 13.1  HCT 46.5 38.2*  MCV 100.2* 96.5  PLT 165 161*   Basic Metabolic Panel: Recent Labs  Lab 07/16/19 1329 07/16/19 1908 07/17/19 0318  NA 139 139 138  K 2.9* 4.6 3.4*  CL 97* 102 100  CO2 13* 27 27  GLUCOSE 183* 125* 98  BUN <5* <5* <5*  CREATININE 0.97 0.73 0.68  CALCIUM 8.3* 7.7* 8.5*  MG 1.3*  --  1.0*  PHOS  --   --  1.9*   GFR: Estimated Creatinine Clearance: 97.3 mL/min (by C-G formula based on SCr of 0.68 mg/dL). Liver Function Tests: Recent Labs  Lab 07/16/19 1329 07/17/19 0318  AST 169* 126*  ALT 58* 44  ALKPHOS 136* 107  BILITOT 5.3* 4.5*  PROT 8.0 6.7  ALBUMIN 3.4* 2.7*   No results for input(s): LIPASE, AMYLASE in the last 168 hours. Recent Labs  Lab 07/16/19 1328 07/17/19 0318  AMMONIA 72* 52*   Coagulation Profile: Recent Labs  Lab 07/16/19 1641  INR 1.5*   Cardiac Enzymes: Recent Labs  Lab 07/16/19 1329  CKTOTAL 98   BNP (last 3 results) No results for input(s): PROBNP in the last 8760 hours. HbA1C: No results for input(s): HGBA1C in the last 72 hours. CBG: Recent Labs  Lab 07/16/19 1304  GLUCAP 171*   Lipid Profile: No results for input(s): CHOL, HDL, LDLCALC, TRIG, CHOLHDL, LDLDIRECT in the last 72 hours. Thyroid Function Tests: No results for input(s): TSH, T4TOTAL, FREET4, T3FREE, THYROIDAB in the last 72 hours. Anemia Panel: No results for input(s): VITAMINB12, FOLATE, FERRITIN, TIBC, IRON, RETICCTPCT in the last 72  hours. Sepsis Labs: Recent Labs  Lab 07/16/19 1508 07/16/19 1719 07/16/19 1908  LATICACIDVEN 9.7* 6.2* 2.8*    Recent Results (from the past 240 hour(s))  SARS CORONAVIRUS 2 (TAT 6-24 HRS) Nasopharyngeal Nasopharyngeal Swab     Status: None   Collection Time: 07/16/19  4:22 PM   Specimen: Nasopharyngeal Swab  Result Value Ref Range Status   SARS Coronavirus 2 NEGATIVE NEGATIVE Final    Comment: (NOTE) SARS-CoV-2 target nucleic acids are NOT DETECTED. The SARS-CoV-2 RNA is generally detectable in upper and lower respiratory specimens during the acute phase of infection. Negative results do not preclude SARS-CoV-2 infection, do not rule out co-infections with other pathogens, and should not be used as the sole basis for treatment or other patient management decisions. Negative results must be combined with clinical observations, patient history, and epidemiological information. The expected result is Negative. Fact Sheet for Patients: SugarRoll.be Fact Sheet for Healthcare Providers: https://www.woods-mathews.com/ This test is not yet approved or cleared by the [place] FDA and  has been authorized for detection and/or diagnosis of SARS-CoV-2 by FDA under an Emergency Use Authorization (EUA). This EUA will remain  in effect (meaning this test can be used) for the duration of the COVID-19 declaration under Section 56 4(b)(1) of the Act, 21 U.S.C. section 360bbb-3(b)(1), unless the authorization is terminated or revoked sooner. Performed at [facility], [place] [address] [place], [place] [address]          Radiology Studies: No results found.      Scheduled Meds: . enoxaparin (LOVENOX) injection  40 mg Subcutaneous Q24H  . folic acid  1 mg Oral Daily  . [START ON 07/18/2019] influenza vac split quadrivalent PF  0.5 mL Intramuscular Tomorrow-1000  . lactulose  30 g Oral TID  . LORazepam  0-4 mg Intravenous Q6H    Followed by  . [START ON 07/18/2019] LORazepam  0-4 mg Intravenous Q12H  . magnesium oxide  400 mg Oral BID  . multivitamin with minerals  1 tablet Oral Daily  . nadolol  20 mg Oral Daily  . thiamine  100 mg Oral Daily   Or  . thiamine  100 mg Intravenous Daily   Continuous Infusions: . sodium chloride 75 mL/hr at 07/17/19 1009     LOS: 1 day    Time spent: 35 minutes spent on chart review, discussion with nursing staff, consultants, updating family and interview/physical exam; more than 50% of that time was spent in counseling and/or coordination of care.    Amir Glaus J [place] DO [facility] Available via Epic secure chat 7am-7pm After these  hours, please refer to coverage provider listed on [email or web address] 07/17/2019, 12:45 PM

## 2019-07-18 LAB — COMPREHENSIVE METABOLIC PANEL
ALT: 44 U/L (ref 0–44)
AST: 125 U/L — ABNORMAL HIGH (ref 15–41)
Albumin: 2.6 g/dL — ABNORMAL LOW (ref 3.5–5.0)
Alkaline Phosphatase: 111 U/L (ref 38–126)
Anion gap: 9 (ref 5–15)
BUN: <5 mg/dL — ABNORMAL LOW (ref 6–20)
CO2: 25 mmol/L (ref 22–32)
Calcium: 7.8 mg/dL — ABNORMAL LOW (ref 8.9–10.3)
Chloride: 104 mmol/L (ref 98–111)
Creatinine, Ser: 0.73 mg/dL (ref 0.61–1.24)
GFR calc Af Amer: >60 mL/min (ref >60)
GFR calc non Af Amer: >60 mL/min (ref >60)
Glucose, Bld: 94 mg/dL (ref 70–99)
Potassium: 3.6 mmol/L (ref 3.5–5.1)
Sodium: 138 mmol/L (ref 135–145)
Total Bilirubin: 4.1 mg/dL — ABNORMAL HIGH (ref 0.3–1.2)
Total Protein: 6.4 g/dL — ABNORMAL LOW (ref 6.5–8.1)

## 2019-07-18 LAB — AMMONIA: Ammonia: 41 umol/L — ABNORMAL HIGH (ref 9–35)

## 2019-07-18 LAB — LACTIC ACID, PLASMA: Lactic Acid, Venous: 0.8 mmol/L (ref 0.5–1.9)

## 2019-07-18 LAB — MAGNESIUM: Magnesium: 1.6 mg/dL — ABNORMAL LOW (ref 1.7–2.4)

## 2019-07-18 MED ORDER — MAGNESIUM SULFATE 2 GM/50ML IV SOLN
2.0000 g | Freq: Once | INTRAVENOUS | Status: AC
  Administered 2019-07-18: 2 g via INTRAVENOUS
  Filled 2019-07-18: qty 50

## 2019-07-18 MED ORDER — THIAMINE HCL 100 MG PO TABS: 100.0000 mg | ORAL_TABLET | Freq: Every day | ORAL | Status: DC

## 2019-07-18 MED ORDER — FOLIC ACID 1 MG PO TABS: 1.0000 mg | ORAL_TABLET | Freq: Every day | ORAL | Status: DC

## 2019-07-18 MED ORDER — ADULT MULTIVITAMIN W/MINERALS CH: 1.0000 | ORAL_TABLET | Freq: Every day | ORAL | Status: AC

## 2019-07-18 NOTE — Discharge Summary (Signed)
Physician Discharge Summary  [name] [facility] AES:[id number] DOB: 27-Jul-1980 DOA: 07/16/2019  PCP: London Pepper, MD  Admit date: 07/16/2019 Discharge date: 07/18/2019  Admitted From:  Home  Disposition:  Home   Recommendations for Outpatient Follow-up:  1. Follow up with PCP in 1 weeks 2. Follow up with GI in 2-3 weeks  3. Please obtain BMP in 1-2 weeks  Discharge Condition: STABLE   CODE STATUS: FULL    Brief Hospitalization Summary: Please see all hospital notes, images, labs for full details of the hospitalization. ADMISSION HPI: [name] is a 39 y.o. 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  He further describes it as a spasming of his upper and lower extremities.  Reports a similar episode occurring last year where he had significant electrolyte derangements.  Associated symptoms included nausea, vomiting, and tremor.  Denied any blood in emesis.  He admitted to drinking a bottle of alcohol last night, but states that otherwise he had been refraining from any alcohol use.  Denies having any fever, recent sick contacts, dark stools, chest pain, abdominal pain, abdominal swelling, or shortness of breath.  Otherwise he reports that he has been taking his medications as advised.  ED Course: Upon admission into the emergency department patient was found to be afebrile, pulse 99-1 42, respirations 13-25, and all other vital signs maintained.  Labs significant for potassium 2.9, chloride 97, CO2 13, creatinine 0.97, glucose 183, anion gap 29, magnesium 1.3, alkaline phosphatase 136, albumin 3.4, AST 169, ALT 58, total bilirubin 5.3, lactic acid 9.7, ammonia level 72, and alcohol level 62.  Patient had been given 400 mg of magnesium oxide once, 2 mg Ativan IV, 20 mg of nadolol, 60 mEq of potassium chloride and 2 L of IV fluids.  There is called to admit.     Brief Narrative:  [name]  is a 39 y.o.malewith medical history significant ofalcohol liver cirrhosiswith esophageal varices and GERD who presentswith complaints of waking up with his hands and arms seizing up. He further describes it as a spasming of his upper and lower extremities. Reports a similar episode occurring last year where he had significant electrolyte derangements. Associated symptoms included nausea, vomiting, and tremor. Denied any blood in emesis. He admitted to drinking a bottle of alcohol last night, but states that otherwise he had been refraining from any alcohol use. Denies having any fever, recent sick contacts, dark stools, chest pain, abdominal pain, abdominal swelling, or shortness of breath. Otherwise he reports that he has been taking his medications as advised.  In the emergency department, patient was found to be afebrile, pulse 99-1 42, respirations 13-25, and all other vital signs maintained. Labs significant for potassium 2.9, chloride 97, CO2 13, creatinine 0.97, glucose 183, anion gap 29, magnesium 1.3, alkaline phosphatase 136, albumin 3.4, AST 169, ALT 58, total bilirubin 5.3, lactic acid 9.7,ammonia level 72, and alcohol level 62. Patient had been given 400 mg of magnesium oxide once, 2 mg Ativan IV, 20 mg of nadolol, 60 mEq of potassium chloride and 2 L of IV fluids. EDP consulted [facility] for admission.  Assessment & Plan:   Active Problems:   ETOH abuse   Hypokalemia   Hypocalcemia   Hypomagnesemia   Alcoholic cirrhosis (HCC)   Electrolyte disturbance   Lactic acidosis   Hyperammonemia (HCC)   Hyperbilirubinemia   Hypokalemia: Acute  Patient presenting with potassium 2.9; on potassium chloride 20 mEq p.o. twice daily at home.  Was repleted aggressively with potassium chloride 60 mEq initially. --Potassium has been repleted.  --Continue to monitor electrolytes outpatient with PCP  Hypomagnesemia: Acute On admission  magnesium level noted to be 1.3, and was repleted with IV and p.o. magnesium. --Magnesium has been repleted.  --Continue magnesium oxide 400 mg BID  Lactic acidosis, metabolic acidosis with elevated anion: Acute.  RESOLVED NOW  Treated with IV fluids and resolved now.   Hyperammonemia: Acuteon chronic.  On admission patientnoted to have elevatedammonia level of 72. Appears otherwise alert and oriented x3 at this time home medications include lactulose 30 g 3 times daily --Ammonia continues to trend down.  Mentation at baseline.   Alcohol abuse:  Patient reports only drinking a bottle of alcohol last night, but unclear if this is true. Initial alcohol level noted to be 62. Vital signs including the tachycardia given concern for possible alcohol withdrawals. --CIWA protocols with Ativanwhile in hospital only --Folic acid, multivitamin, thiamine recommended at discharge   Alcoholic liver cirrhosis with esophageal varices, coagulopathy:  No reports of bleeding, but INR elevated at 1.5.Meld-Nascore 14. Follows with GI Dr. Michail Sermon outpatient. --Continue nadolol 20 mg p.o. daily --resume home lasix   Elevated liver enzymes: Chronic.  AST to ALT ratio still suggestive of alcohol use. --Continue to monitor  Hyperbilirubinemia:Chronic.  Total bilirubin 5.3 which appears near patient's baseline. --Tbili 5.3-->4.5 --Continue to monitor  Prolonged QT interval: Initial QTc noted to be 496. --Avoid QT prolonging medications  GERD: continue PPI  DVT prophylaxis: Lovenox Code Status: Full code Family Communication: Updated patient extensively at bedside Disposition Plan:      Patient is from: Home with parents     Anticipated Disposition: Home with parents     Barriers to discharge or conditions that needs to be met prior to discharge: Once electrolyte disturbances have resolved  Discharge Diagnoses:  Active Problems:   ETOH abuse   Hypokalemia    Hypocalcemia   Hypomagnesemia   Alcoholic cirrhosis (HCC)   Electrolyte disturbance   Lactic acidosis   Hyperammonemia ([place])   Hyperbilirubinemia   Discharge Instructions:  Allergies as of 07/18/2019      Reactions   Penicillins Hives      Medication List    STOP taking these medications   rifaximin 550 MG Tabs tablet Commonly known as: XIFAXAN   spironolactone 50 MG tablet Commonly known as: ALDACTONE     TAKE these medications   folic acid 1 MG tablet Commonly known as: FOLVITE Take 1 tablet (1 mg total) by mouth daily. Start taking on: July 19, 2019   furosemide 20 MG tablet Commonly known as: LASIX Take 20 mg by mouth daily. What changed: Another medication with the same name was removed. Continue taking this medication, and follow the directions you see here.   IRON PO Take 1 tablet by mouth daily.   lactulose 10 GM/15ML solution Commonly known as: CHRONULAC Take 45 mLs (30 g total) by mouth 3 (three) times daily.   magnesium oxide 400 (241.3 Mg) MG tablet Commonly known as: MAG-OX Take 1 tablet (400 mg total) by mouth 2 (two) times daily.   multivitamin with minerals Tabs tablet Take 1 tablet by mouth daily. Start taking on: July 19, 2019   nadolol 20 MG tablet Commonly known as: CORGARD Take 20 mg by mouth daily.   omeprazole 20 MG capsule Commonly known as: PRILOSEC Take 20 mg by  mouth daily.   potassium chloride SA 20 MEQ tablet Commonly known as: KLOR-CON Take 20 mEq by mouth 2 (two) times daily.   thiamine 100 MG tablet Take 1 tablet (100 mg total) by mouth daily. Start taking on: July 19, 2019      Follow-up Information    London Pepper, MD. Schedule an appointment as soon as possible for a visit in 1 week(s).   Specialty: Family Medicine Contact information: [place] [address] [phone]        [place], [place]. Schedule an appointment as soon as  possible for a visit in 1 week(s).   Why: hospital Follow up GI Contact information: [place] [address] [phone]          Allergies  Allergen Reactions  . Penicillins Hives   Allergies as of 07/18/2019      Reactions   Penicillins Hives      Medication List    STOP taking these medications   rifaximin 550 MG Tabs tablet Commonly known as: XIFAXAN   spironolactone 50 MG tablet Commonly known as: ALDACTONE     TAKE these medications   folic acid 1 MG tablet Commonly known as: FOLVITE Take 1 tablet (1 mg total) by mouth daily. Start taking on: July 19, 2019   furosemide 20 MG tablet Commonly known as: LASIX Take 20 mg by mouth daily. What changed: Another medication with the same name was removed. Continue taking this medication, and follow the directions you see here.   IRON PO Take 1 tablet by mouth daily.   lactulose 10 GM/15ML solution Commonly known as: CHRONULAC Take 45 mLs (30 g total) by mouth 3 (three) times daily.   magnesium oxide 400 (241.3 Mg) MG tablet Commonly known as: MAG-OX Take 1 tablet (400 mg total) by mouth 2 (two) times daily.   multivitamin with minerals Tabs tablet Take 1 tablet by mouth daily. Start taking on: July 19, 2019   nadolol 20 MG tablet Commonly known as: CORGARD Take 20 mg by mouth daily.   omeprazole 20 MG capsule Commonly known as: PRILOSEC Take 20 mg by mouth daily.   potassium chloride SA 20 MEQ tablet Commonly known as: KLOR-CON Take 20 mEq by mouth 2 (two) times daily.   thiamine 100 MG tablet Take 1 tablet (100 mg total) by mouth daily. Start taking on: July 19, 2019       Procedures/Studies:  No results found.   Subjective: Pt says he feels much better.  He says he is not going to start drinking alcohol again.   Discharge Exam: Vitals:   07/17/19 2118 07/18/19 0632  BP: (!) 122/97 112/83  Pulse: 68 (!) 106  Resp: 18 18  Temp: 98.4 F (36.9 C) 98.1 F (36.7 C)   SpO2: 98% 98%   Vitals:   07/17/19 1150 07/17/19 1650 07/17/19 2118 07/18/19 0632  BP: 122/85 114/88 (!) 122/97 112/83  Pulse: 71 77 68 (!) 106  Resp:   18 18  Temp:   98.4 F (36.9 C) 98.1 F (36.7 C)  TempSrc:   Oral Oral  SpO2:   98% 98%  Weight:    55.5 kg  Height:       General: thin male, Pt is alert, awake, not in acute distress Cardiovascular: RRR, S1/S2 +, no rubs, no gallops Respiratory: CTA bilaterally, no wheezing, no rhonchi Abdominal: Soft, NT, ND, bowel sounds + Extremities: no  edema, no cyanosis   The results of significant diagnostics from this hospitalization (including imaging, microbiology, ancillary and laboratory) are listed below for reference.     Microbiology: Recent Results (from the past 240 hour(s))  SARS CORONAVIRUS 2 (TAT 6-24 HRS) Nasopharyngeal Nasopharyngeal Swab     Status: None   Collection Time: 07/16/19  4:22 PM   Specimen: Nasopharyngeal Swab  Result Value Ref Range Status   SARS Coronavirus 2 NEGATIVE NEGATIVE Final    Comment: (NOTE) SARS-CoV-2 target nucleic acids are NOT DETECTED. The SARS-CoV-2 RNA is generally detectable in upper and lower respiratory specimens during the acute phase of infection. Negative results do not preclude SARS-CoV-2 infection, do not rule out co-infections with other pathogens, and should not be used as the sole basis for treatment or other patient management decisions. Negative results must be combined with clinical observations, patient history, and epidemiological information. The expected result is Negative. Fact Sheet for Patients: SugarRoll.be Fact Sheet for Healthcare Providers: https://www.woods-mathews.com/ This test is not yet approved or cleared by the [place] FDA and  has been authorized for detection and/or diagnosis of SARS-CoV-2 by FDA under an Emergency Use Authorization (EUA). This EUA will remain  in effect (meaning this test can be  used) for the duration of the COVID-19 declaration under Section 56 4(b)(1) of the Act, 21 U.S.C. section 360bbb-3(b)(1), unless the authorization is terminated or revoked sooner. Performed at [facility], [place] [address], [place], [place] [address]      Labs: BNP (last 3 results) No results for input(s): BNP in the last 8760 hours. Basic Metabolic Panel: Recent Labs  Lab 07/16/19 1329 07/16/19 1908 07/17/19 0318 07/18/19 0537  NA 139 139 138 138  K 2.9* 4.6 3.4* 3.6  CL 97* 102 100 104  CO2 13* _0 GLUCOSE 183* 125* 98 94  BUN <5* <5* <5* <5*  CREATININE 0.97 0.73 0.68 0.73  CALCIUM 8.3* 7.7* 8.5* 7.8*  MG 1.3*  --  1.0* 1.6*  PHOS  --   --  1.9*  --    Liver Function Tests: Recent Labs  Lab 07/16/19 1329 07/17/19 0318 07/18/19 0537  AST 169* 126* 125*  ALT 58* 44 44  ALKPHOS 136* 107 111  BILITOT 5.3* 4.5* 4.1*  PROT 8.0 6.7 6.4*  ALBUMIN 3.4* 2.7* 2.6*   No results for input(s): LIPASE, AMYLASE in the last 168 hours. Recent Labs  Lab 07/16/19 1328 07/17/19 0318 07/18/19 0537  AMMONIA 72* 52* 41*   CBC: Recent Labs  Lab 07/16/19 1329 07/17/19 0318  WBC 5.7 7.1  NEUTROABS 4.2  --   HGB 15.4 13.1  HCT 46.5 38.2*  MCV 100.2* 96.5  PLT 165 100*   Cardiac Enzymes: Recent Labs  Lab 07/16/19 1329  CKTOTAL 98   BNP: Invalid input(s): POCBNP CBG: Recent Labs  Lab 07/16/19 1304  GLUCAP 171*   D-Dimer No results for input(s): DDIMER in the last 72 hours. Hgb A1c No results for input(s): HGBA1C in the last 72 hours. Lipid Profile No results for input(s): CHOL, HDL, LDLCALC, TRIG, CHOLHDL, LDLDIRECT in the last 72 hours. Thyroid function studies No results for input(s): TSH, T4TOTAL, T3FREE, THYROIDAB in the last 72 hours.  Invalid input(s): FREET3 Anemia work up No results for input(s): VITAMINB12, FOLATE, FERRITIN, TIBC, IRON, RETICCTPCT in the last 72 hours. Urinalysis    Component Value Date/Time   COLORURINE YELLOW  07/17/2019 [place] 07/17/2019 0735   LABSPEC 1.011  07/17/2019 [place] 8.0 07/17/2019 0735   GLUCOSEU NEGATIVE 07/17/2019 0735   HGBUR NEGATIVE 07/17/2019 0735   BILIRUBINUR NEGATIVE 07/17/2019 0735   KETONESUR NEGATIVE 07/17/2019 0735   PROTEINUR NEGATIVE 07/17/2019 0735   NITRITE NEGATIVE 07/17/2019 0735   LEUKOCYTESUR NEGATIVE 07/17/2019 0735   Sepsis Labs Invalid input(s): PROCALCITONIN,  WBC,  LACTICIDVEN Microbiology Recent Results (from the past 240 hour(s))  SARS CORONAVIRUS 2 (TAT 6-24 HRS) Nasopharyngeal Nasopharyngeal Swab     Status: None   Collection Time: 07/16/19  4:22 PM   Specimen: Nasopharyngeal Swab  Result Value Ref Range Status   SARS Coronavirus 2 NEGATIVE NEGATIVE Final    Comment: (NOTE) SARS-CoV-2 target nucleic acids are NOT DETECTED. The SARS-CoV-2 RNA is generally detectable in upper and lower respiratory specimens during the acute phase of infection. Negative results do not preclude SARS-CoV-2 infection, do not rule out co-infections with other pathogens, and should not be used as the sole basis for treatment or other patient management decisions. Negative results must be combined with clinical observations, patient history, and epidemiological information. The expected result is Negative. Fact Sheet for Patients: SugarRoll.be Fact Sheet for Healthcare Providers: https://www.woods-mathews.com/ This test is not yet approved or cleared by the [place] FDA and  has been authorized for detection and/or diagnosis of SARS-CoV-2 by FDA under an Emergency Use Authorization (EUA). This EUA will remain  in effect (meaning this test can be used) for the duration of the COVID-19 declaration under Section 56 4(b)(1) of the Act, 21 U.S.C. section 360bbb-3(b)(1), unless the authorization is terminated or revoked sooner. Performed at [facility], [place] [address], [place],  [place] [address]     Time coordinating discharge:  33 mins  SIGNED:  Irwin Brakeman, MD  [facility] 07/18/2019, 10:36 AM How to contact the [facility] - Inpatient Attending or Consulting provider [place] or covering provider during after hours [place], for this patient?  1. Check the care team in [facility] and look for a) attending/consulting [facility] provider listed and b) the [facility] team listed 2. Log into [email or web address] and use [place]'s universal password to access. If you do not have the password, please contact the hospital operator. 3. Locate the [facility] provider you are looking for under [facility] and page to a number that you can be directly reached. 4. If you still have difficulty reaching the provider, please page the [facility] (Director on Call) for the Hospitalists listed on amion for assistance.

## 2019-07-18 NOTE — Discharge Instructions (Signed)
IMPORTANT INFORMATION: PAY CLOSE ATTENTION   PHYSICIAN DISCHARGE INSTRUCTIONS  Follow with Primary care provider  James Has, MD  and other consultants as instructed by your Hospitalist Physician  SEEK MEDICAL CARE OR RETURN TO EMERGENCY ROOM IF SYMPTOMS COME BACK, WORSEN OR NEW PROBLEM DEVELOPS   Please note: You were cared for by a hospitalist during your hospital stay. Every effort will be made to forward records to your primary care provider.  You can request that your primary care provider send for your hospital records if they have not received them.  Once you are discharged, your primary care physician will handle any further medical issues. Please note that NO REFILLS for any discharge medications will be authorized once you are discharged, as it is imperative that you return to your primary care physician (or establish a relationship with a primary care physician if you do not have one) for your post hospital discharge needs so that they can reassess your need for medications and monitor your lab values.  Please get a complete blood count and chemistry panel checked by your Primary MD at your next visit, and again as instructed by your Primary MD.  Get Medicines reviewed and adjusted: Please take all your medications with you for your next visit with your Primary MD  Laboratory/radiological data: Please request your Primary MD to go over all hospital tests and procedure/radiological results at the follow up, please ask your primary care provider to get all Hospital records sent to his/her office.  In some cases, they will be blood work, cultures and biopsy results pending at the time of your discharge. Please request that your primary care provider follow up on these results.  If you are diabetic, please bring your blood sugar readings with you to your follow up appointment with primary care.    Please call and make your follow up appointments as soon as possible.    Also Note  the following: If you experience worsening of your admission symptoms, develop shortness of breath, life threatening emergency, suicidal or homicidal thoughts you must seek medical attention immediately by calling 911 or calling your MD immediately  if symptoms less severe.  You must read complete instructions/literature along with all the possible adverse reactions/side effects for all the Medicines you take and that have been prescribed to you. Take any new Medicines after you have completely understood and accpet all the possible adverse reactions/side effects.   Do not drive when taking Pain medications or sleeping medications (Benzodiazepines)  Do not take more than prescribed Pain, Sleep and Anxiety Medications. It is not advisable to combine anxiety,sleep and pain medications without talking with your primary care practitioner  Special Instructions: If you have smoked or chewed Tobacco  in the last 2 yrs please stop smoking, stop any regular Alcohol  and or any Recreational drug use.  Wear Seat belts while driving.  Do not drive if taking any narcotic, mind altering or controlled substances or recreational drugs or alcohol.

## 2019-07-18 NOTE — Plan of Care (Signed)

## 2019-08-30 ENCOUNTER — Other Ambulatory Visit: Payer: Self-pay

## 2019-08-30 ENCOUNTER — Inpatient Hospital Stay (HOSPITAL_COMMUNITY)
Admission: EM | Admit: 2019-08-30 | Discharge: 2019-09-02 | DRG: 368 | Disposition: A | Discharge status: Home / Self Care | Payer: Self-pay | Attending: Internal Medicine | Admitting: Internal Medicine

## 2019-08-30 ENCOUNTER — Encounter (HOSPITAL_COMMUNITY)
Admission: EM | Disposition: A | Discharge status: Home / Self Care | Payer: Self-pay | Source: Home / Self Care | Attending: Critical Care Medicine

## 2019-08-30 ENCOUNTER — Emergency Department (HOSPITAL_COMMUNITY): Payer: Self-pay

## 2019-08-30 ENCOUNTER — Inpatient Hospital Stay (HOSPITAL_COMMUNITY): Payer: Self-pay | Admitting: Anesthesiology

## 2019-08-30 ENCOUNTER — Encounter (HOSPITAL_COMMUNITY): Payer: Self-pay | Admitting: Emergency Medicine

## 2019-08-30 DIAGNOSIS — R578 Other shock: Secondary | ICD-10-CM | POA: Diagnosis present

## 2019-08-30 DIAGNOSIS — K922 Gastrointestinal hemorrhage, unspecified: Secondary | ICD-10-CM | POA: Diagnosis present

## 2019-08-30 DIAGNOSIS — E875 Hyperkalemia: Secondary | ICD-10-CM

## 2019-08-30 DIAGNOSIS — Z87891 Personal history of nicotine dependence: Secondary | ICD-10-CM

## 2019-08-30 DIAGNOSIS — K7010 Alcoholic hepatitis without ascites: Secondary | ICD-10-CM | POA: Diagnosis present

## 2019-08-30 DIAGNOSIS — N62 Hypertrophy of breast: Secondary | ICD-10-CM | POA: Diagnosis present

## 2019-08-30 DIAGNOSIS — K7030 Alcoholic cirrhosis of liver without ascites: Secondary | ICD-10-CM | POA: Diagnosis present

## 2019-08-30 DIAGNOSIS — R7989 Other specified abnormal findings of blood chemistry: Secondary | ICD-10-CM

## 2019-08-30 DIAGNOSIS — F1010 Alcohol abuse, uncomplicated: Secondary | ICD-10-CM

## 2019-08-30 DIAGNOSIS — D689 Coagulation defect, unspecified: Secondary | ICD-10-CM | POA: Diagnosis present

## 2019-08-30 DIAGNOSIS — D696 Thrombocytopenia, unspecified: Secondary | ICD-10-CM | POA: Diagnosis present

## 2019-08-30 DIAGNOSIS — R791 Abnormal coagulation profile: Secondary | ICD-10-CM | POA: Diagnosis present

## 2019-08-30 DIAGNOSIS — E876 Hypokalemia: Secondary | ICD-10-CM | POA: Diagnosis present

## 2019-08-30 DIAGNOSIS — I959 Hypotension, unspecified: Secondary | ICD-10-CM

## 2019-08-30 DIAGNOSIS — D62 Acute posthemorrhagic anemia: Secondary | ICD-10-CM | POA: Diagnosis present

## 2019-08-30 DIAGNOSIS — R579 Shock, unspecified: Secondary | ICD-10-CM | POA: Diagnosis present

## 2019-08-30 DIAGNOSIS — E872 Acidosis: Secondary | ICD-10-CM | POA: Diagnosis present

## 2019-08-30 DIAGNOSIS — K219 Gastro-esophageal reflux disease without esophagitis: Secondary | ICD-10-CM | POA: Diagnosis present

## 2019-08-30 DIAGNOSIS — Z79899 Other long term (current) drug therapy: Secondary | ICD-10-CM

## 2019-08-30 DIAGNOSIS — I8501 Esophageal varices with bleeding: Secondary | ICD-10-CM

## 2019-08-30 DIAGNOSIS — K766 Portal hypertension: Secondary | ICD-10-CM | POA: Diagnosis present

## 2019-08-30 DIAGNOSIS — K3189 Other diseases of stomach and duodenum: Secondary | ICD-10-CM | POA: Diagnosis present

## 2019-08-30 DIAGNOSIS — I8511 Secondary esophageal varices with bleeding: Principal | ICD-10-CM | POA: Diagnosis present

## 2019-08-30 DIAGNOSIS — Z20822 Contact with and (suspected) exposure to covid-19: Secondary | ICD-10-CM | POA: Diagnosis present

## 2019-08-30 DIAGNOSIS — Z880 Allergy status to penicillin: Secondary | ICD-10-CM

## 2019-08-30 HISTORY — PX: ESOPHAGEAL BANDING: SHX5518

## 2019-08-30 HISTORY — PX: ESOPHAGOGASTRODUODENOSCOPY (EGD) WITH PROPOFOL: SHX5813

## 2019-08-30 LAB — I-STAT CHEM 8, ED
BUN: 17 mg/dL (ref 6–20)
Calcium, Ion: 0.95 mmol/L — ABNORMAL LOW (ref 1.15–1.40)
Chloride: 102 mmol/L (ref 98–111)
Creatinine, Ser: 1.1 mg/dL (ref 0.61–1.24)
Glucose, Bld: 121 mg/dL — ABNORMAL HIGH (ref 70–99)
HCT: 31 % — ABNORMAL LOW (ref 39.0–52.0)
Hemoglobin: 10.5 g/dL — ABNORMAL LOW (ref 13.0–17.0)
Potassium: 5.8 mmol/L — ABNORMAL HIGH (ref 3.5–5.1)
Sodium: 140 mmol/L (ref 135–145)
TCO2: 22 mmol/L (ref 22–32)

## 2019-08-30 LAB — LACTIC ACID, PLASMA
Lactic Acid, Venous: 8 mmol/L (ref 0.5–1.9)
Lactic Acid, Venous: 6 mmol/L (ref 0.5–1.9)
Lactic Acid, Venous: 3.9 mmol/L (ref 0.5–1.9)

## 2019-08-30 LAB — CBC
HCT: 33.7 % — ABNORMAL LOW (ref 39.0–52.0)
Hemoglobin: 11.3 g/dL — ABNORMAL LOW (ref 13.0–17.0)
MCH: 33.5 pg (ref 26.0–34.0)
MCHC: 33.5 g/dL (ref 30.0–36.0)
MCV: 100 fL (ref 80.0–100.0)
Platelets: 267 10*3/uL (ref 150–400)
RBC: 3.37 MIL/uL — ABNORMAL LOW (ref 4.22–5.81)
RDW: 13 % (ref 11.5–15.5)
WBC: 18.9 10*3/uL — ABNORMAL HIGH (ref 4.0–10.5)
nRBC: 0 % (ref 0.0–0.2)

## 2019-08-30 LAB — HEMOGLOBIN AND HEMATOCRIT, BLOOD
HCT: 32.9 % — ABNORMAL LOW (ref 39.0–52.0)
Hemoglobin: 11.1 g/dL — ABNORMAL LOW (ref 13.0–17.0)

## 2019-08-30 LAB — PROTIME-INR
INR: 1.9 — ABNORMAL HIGH (ref 0.8–1.2)
Prothrombin Time: 21.4 seconds — ABNORMAL HIGH (ref 11.4–15.2)

## 2019-08-30 LAB — PREPARE RBC (CROSSMATCH)

## 2019-08-30 LAB — COMPREHENSIVE METABOLIC PANEL
ALT: 96 U/L — ABNORMAL HIGH (ref 0–44)
AST: 288 U/L — ABNORMAL HIGH (ref 15–41)
Albumin: 2.9 g/dL — ABNORMAL LOW (ref 3.5–5.0)
Alkaline Phosphatase: 66 U/L (ref 38–126)
Anion gap: 17 — ABNORMAL HIGH (ref 5–15)
BUN: 17 mg/dL (ref 6–20)
CO2: 22 mmol/L (ref 22–32)
Calcium: 8.5 mg/dL — ABNORMAL LOW (ref 8.9–10.3)
Chloride: 101 mmol/L (ref 98–111)
Creatinine, Ser: 1.16 mg/dL (ref 0.61–1.24)
GFR calc Af Amer: >60 mL/min (ref >60)
GFR calc non Af Amer: >60 mL/min (ref >60)
Glucose, Bld: 134 mg/dL — ABNORMAL HIGH (ref 70–99)
Potassium: 5.9 mmol/L — ABNORMAL HIGH (ref 3.5–5.1)
Sodium: 140 mmol/L (ref 135–145)
Total Bilirubin: 4.7 mg/dL — ABNORMAL HIGH (ref 0.3–1.2)
Total Protein: 6.6 g/dL (ref 6.5–8.1)

## 2019-08-30 LAB — GLUCOSE, CAPILLARY: Glucose-Capillary: 168 mg/dL — ABNORMAL HIGH (ref 70–99)

## 2019-08-30 LAB — OCCULT BLOOD GASTRIC / DUODENUM (SPECIMEN CUP): Occult Blood, Gastric: POSITIVE — AB

## 2019-08-30 LAB — POTASSIUM: Potassium: 4.7 mmol/L (ref 3.5–5.1)

## 2019-08-30 LAB — AMMONIA: Ammonia: 47 umol/L — ABNORMAL HIGH (ref 9–35)

## 2019-08-30 LAB — RESPIRATORY PANEL BY RT PCR (FLU A&B, COVID)
Influenza A by PCR: NEGATIVE
Influenza B by PCR: NEGATIVE
SARS Coronavirus 2 by RT PCR: NEGATIVE

## 2019-08-30 LAB — ABO/RH: ABO/RH(D): O NEG

## 2019-08-30 LAB — APTT: aPTT: 31 seconds (ref 24–36)

## 2019-08-30 LAB — MRSA PCR SCREENING: MRSA by PCR: NEGATIVE

## 2019-08-30 SURGERY — ESOPHAGOGASTRODUODENOSCOPY (EGD) WITH PROPOFOL
Anesthesia: General

## 2019-08-30 MED ORDER — LORAZEPAM 1 MG PO TABS: 0.0000 mg | ORAL_TABLET | Freq: Two times a day (BID) | ORAL | Status: DC

## 2019-08-30 MED ORDER — PROPOFOL 10 MG/ML IV BOLUS
INTRAVENOUS | Status: DC | PRN
  Administered 2019-08-30: 150 mg via INTRAVENOUS

## 2019-08-30 MED ORDER — MEPERIDINE HCL 100 MG/ML IJ SOLN: 6.2500 mg | INTRAMUSCULAR | Status: DC | PRN

## 2019-08-30 MED ORDER — SODIUM CHLORIDE 0.9 % IV SOLN
1.0000 g | Freq: Once | INTRAVENOUS | Status: AC
  Administered 2019-08-30: 1 g via INTRAVENOUS
  Filled 2019-08-30: qty 10

## 2019-08-30 MED ORDER — ONDANSETRON HCL 4 MG/2ML IJ SOLN
INTRAMUSCULAR | Status: DC | PRN
  Administered 2019-08-30: 4 mg via INTRAVENOUS

## 2019-08-30 MED ORDER — SODIUM CHLORIDE 0.9 % IV SOLN: INTRAVENOUS | Status: DC

## 2019-08-30 MED ORDER — SODIUM CHLORIDE 0.9 % IV SOLN
50.0000 ug/h | INTRAVENOUS | Status: DC
  Administered 2019-08-30 – 2019-09-02 (×7): 50 ug/h via INTRAVENOUS
  Filled 2019-08-30 (×8): qty 1

## 2019-08-30 MED ORDER — PANTOPRAZOLE SODIUM 40 MG IV SOLR
40.0000 mg | Freq: Once | INTRAVENOUS | Status: AC
  Administered 2019-08-30: 40 mg via INTRAVENOUS
  Filled 2019-08-30: qty 40

## 2019-08-30 MED ORDER — PROMETHAZINE HCL 25 MG/ML IJ SOLN
6.2500 mg | INTRAMUSCULAR | Status: DC | PRN
  Administered 2019-08-30: 6.25 mg via INTRAVENOUS

## 2019-08-30 MED ORDER — DOCUSATE SODIUM 100 MG PO CAPS: 100.0000 mg | ORAL_CAPSULE | Freq: Two times a day (BID) | ORAL | Status: DC | PRN

## 2019-08-30 MED ORDER — LACTATED RINGERS IV SOLN
INTRAVENOUS | Status: DC
  Administered 2019-08-30: 1000 mL via INTRAVENOUS

## 2019-08-30 MED ORDER — FENTANYL CITRATE (PF) 100 MCG/2ML IJ SOLN
25.0000 ug | INTRAMUSCULAR | Status: DC | PRN
  Administered 2019-08-30: 25 ug via INTRAVENOUS

## 2019-08-30 MED ORDER — SODIUM CHLORIDE 0.9 % IV SOLN: 10.0000 mL/h | Freq: Once | INTRAVENOUS | Status: DC

## 2019-08-30 MED ORDER — LORAZEPAM 1 MG PO TABS: 0.0000 mg | ORAL_TABLET | Freq: Four times a day (QID) | ORAL | Status: AC

## 2019-08-30 MED ORDER — SODIUM CHLORIDE 0.9 % IV BOLUS
1000.0000 mL | Freq: Once | INTRAVENOUS | Status: AC
  Administered 2019-08-30: 1000 mL via INTRAVENOUS

## 2019-08-30 MED ORDER — MIDAZOLAM HCL 2 MG/2ML IJ SOLN
INTRAMUSCULAR | Status: AC
  Filled 2019-08-30: qty 2

## 2019-08-30 MED ORDER — MIDAZOLAM HCL 2 MG/2ML IJ SOLN: 0.5000 mg | Freq: Once | INTRAMUSCULAR | Status: DC | PRN

## 2019-08-30 MED ORDER — OCTREOTIDE LOAD VIA INFUSION
100.0000 ug | Freq: Once | INTRAVENOUS | Status: AC
  Administered 2019-08-30: 100 ug via INTRAVENOUS
  Filled 2019-08-30: qty 50

## 2019-08-30 MED ORDER — POLYETHYLENE GLYCOL 3350 17 G PO PACK: 17.0000 g | PACK | Freq: Every day | ORAL | Status: DC | PRN

## 2019-08-30 MED ORDER — LORAZEPAM 2 MG/ML IJ SOLN: 0.0000 mg | Freq: Two times a day (BID) | INTRAMUSCULAR | Status: DC

## 2019-08-30 MED ORDER — PANTOPRAZOLE SODIUM 40 MG IV SOLR
40.0000 mg | Freq: Two times a day (BID) | INTRAVENOUS | Status: DC
  Administered 2019-08-30: 22:00:00 40 mg via INTRAVENOUS
  Filled 2019-08-30 (×2): qty 40

## 2019-08-30 MED ORDER — SODIUM CHLORIDE 0.9% IV SOLUTION: Freq: Once | INTRAVENOUS | Status: DC

## 2019-08-30 MED ORDER — SUCCINYLCHOLINE CHLORIDE 200 MG/10ML IV SOSY
PREFILLED_SYRINGE | INTRAVENOUS | Status: DC | PRN
  Administered 2019-08-30: 120 mg via INTRAVENOUS

## 2019-08-30 MED ORDER — MIDAZOLAM HCL 5 MG/5ML IJ SOLN
INTRAMUSCULAR | Status: DC | PRN
  Administered 2019-08-30: 2 mg via INTRAVENOUS

## 2019-08-30 MED ORDER — FOLIC ACID 5 MG/ML IJ SOLN
1.0000 mg | Freq: Every day | INTRAMUSCULAR | Status: DC
  Filled 2019-08-30 (×2): qty 0.2

## 2019-08-30 MED ORDER — THIAMINE HCL 100 MG/ML IJ SOLN: 100.0000 mg | Freq: Every day | INTRAMUSCULAR | Status: DC

## 2019-08-30 MED ORDER — METOCLOPRAMIDE HCL 5 MG/ML IJ SOLN
10.0000 mg | Freq: Once | INTRAMUSCULAR | Status: AC
  Administered 2019-08-30: 10 mg via INTRAVENOUS
  Filled 2019-08-30: qty 2

## 2019-08-30 MED ORDER — SODIUM CHLORIDE 0.9 % IV SOLN
1.0000 g | INTRAVENOUS | Status: DC
  Administered 2019-08-31 – 2019-09-01 (×2): 1 g via INTRAVENOUS
  Filled 2019-08-30: qty 1
  Filled 2019-08-30: qty 10
  Filled 2019-08-30: qty 1
  Filled 2019-08-30: qty 10

## 2019-08-30 MED ORDER — FENTANYL CITRATE (PF) 100 MCG/2ML IJ SOLN
INTRAMUSCULAR | Status: AC
  Filled 2019-08-30: qty 2

## 2019-08-30 MED ORDER — THIAMINE HCL 100 MG PO TABS
100.0000 mg | ORAL_TABLET | Freq: Every day | ORAL | Status: DC
  Administered 2019-08-31 – 2019-09-02 (×3): 100 mg via ORAL
  Filled 2019-08-30 (×3): qty 1

## 2019-08-30 MED ORDER — LORAZEPAM 2 MG/ML IJ SOLN: 0.0000 mg | Freq: Four times a day (QID) | INTRAMUSCULAR | Status: AC

## 2019-08-30 MED ORDER — LORAZEPAM 1 MG PO TABS: 1.0000 mg | ORAL_TABLET | ORAL | Status: DC | PRN

## 2019-08-30 MED ORDER — SODIUM CHLORIDE 0.9 % IV SOLN: 1.0000 g | INTRAVENOUS | Status: DC

## 2019-08-30 MED ORDER — PROMETHAZINE HCL 25 MG/ML IJ SOLN
INTRAMUSCULAR | Status: AC
  Filled 2019-08-30: qty 1

## 2019-08-30 MED ORDER — CALCIUM GLUCONATE-NACL 2-0.675 GM/100ML-% IV SOLN
2.0000 g | Freq: Once | INTRAVENOUS | Status: AC
  Administered 2019-08-30: 2000 mg via INTRAVENOUS
  Filled 2019-08-30: qty 100

## 2019-08-30 MED ORDER — THIAMINE HCL 100 MG/ML IJ SOLN
100.0000 mg | Freq: Every day | INTRAMUSCULAR | Status: DC
  Administered 2019-08-30: 100 mg via INTRAVENOUS
  Filled 2019-08-30 (×2): qty 2

## 2019-08-30 MED ORDER — ALBUMIN HUMAN 5 % IV SOLN: INTRAVENOUS | Status: DC | PRN

## 2019-08-30 MED ORDER — FENTANYL CITRATE (PF) 250 MCG/5ML IJ SOLN
INTRAMUSCULAR | Status: DC | PRN
  Administered 2019-08-30: 100 ug via INTRAVENOUS

## 2019-08-30 MED ORDER — ONDANSETRON HCL 4 MG/2ML IJ SOLN: 4.0000 mg | Freq: Four times a day (QID) | INTRAMUSCULAR | Status: DC | PRN

## 2019-08-30 MED ORDER — LORAZEPAM 2 MG/ML IJ SOLN: 1.0000 mg | INTRAMUSCULAR | Status: DC | PRN

## 2019-08-30 SURGICAL SUPPLY — 14 items

## 2019-08-30 NOTE — Consult Note (Addendum)
[facility] Consult  Referring Provider: ER physician Primary Care Physician:  Farris Has, MD Primary Gastroenterologist: Henrietta Dine dismissed from Dr.Schooler's practice due to non compliance)  Reason for Consultation: Hematemesis  HPI: [name] is a 40 y.o. male with history of alcohol related cirrhosis, last alcohol use 2 days ago, states he drank 1 pint of vodka, presents to the ED with 6 hours of vomiting blood. Patient's mother who lives with him is present at bedside and states that he filled an entire canister, almost a gallon, with bright red blood and clots. Patient states that he has been taking nadolol 20 mg daily for history of esophageal varices along with furosemide 20 mg daily for history of ascites and lactulose about 45 to 60 mL daily for history of hepatic encephalopathy. In the past he has had a liver transplant evaluation, however has relapsed from alcohol abuse and continues to drink intermittently.  Patient has history of significant alcohol use since the age of 89. He denies noticing black stool or bloody bowel movements. In the ER he was found to have low blood pressure, tachycardia with a heart rate of 130, has been given 1 unit of PRBC, has IV octreotide drip, second unit of PRBC has been started.   Past Medical History:  Diagnosis Date  . Cirrhosis of liver (HCC)   . Esophageal varices with hemorrhage (HCC)   . ETOH abuse   . GERD (gastroesophageal reflux disease)     Past Surgical History:  Procedure Laterality Date  . ESOPHAGOGASTRODUODENOSCOPY Left 08/21/2015   Procedure: ESOPHAGOGASTRODUODENOSCOPY (EGD);  Surgeon: Charlott Rakes, MD;  Location: [facility];  Service: Endoscopy;  Laterality: Left;    Prior to Admission medications   Medication Sig Start Date End Date Taking? Authorizing Provider  folic acid (FOLVITE) 1 MG tablet Take 1 tablet (1 mg total) by mouth daily. 07/19/19  Yes Johnson, Clanford L, MD  furosemide  (LASIX) 20 MG tablet Take 20 mg by mouth daily. 04/27/19  Yes [provider]  IRON PO Take 1 tablet by mouth daily.   Yes [provider]  lactulose (CHRONULAC) 10 GM/15ML solution Take 45 mLs (30 g total) by mouth 3 (three) times daily. 09/08/15  Yes Jerald Kief, MD  magnesium oxide (MAG-OX) 400 (241.3 Mg) MG tablet Take 1 tablet (400 mg total) by mouth 2 (two) times daily. 11/15/18  Yes Pokhrel, Laxman, MD  Multiple Vitamin (MULTIVITAMIN WITH MINERALS) TABS tablet Take 1 tablet by mouth daily. 07/19/19  Yes Johnson, Clanford L, MD  nadolol (CORGARD) 20 MG tablet Take 20 mg by mouth daily. 05/23/18  Yes [provider]  omeprazole (PRILOSEC) 20 MG capsule Take 20 mg by mouth daily.   Yes [provider]  potassium chloride SA (K-DUR,KLOR-CON) 20 MEQ tablet Take 20 mEq by mouth 2 (two) times daily. 05/26/18  Yes [provider]  sertraline (ZOLOFT) 100 MG tablet Take 100 mg by mouth daily. 07/19/19  Yes [provider]  thiamine 100 MG tablet Take 1 tablet (100 mg total) by mouth daily. 07/19/19  Yes Cleora Fleet, MD    Current Facility-Administered Medications  Medication Dose Route Frequency Provider Last Rate Last Admin  . 0.9 %  sodium chloride infusion   Intravenous Continuous Henderly, Britni A, PA-C      . 0.9 %  sodium chloride infusion  10 mL/hr Intravenous Once Henderly, Britni A, PA-C      . 0.9 %  sodium chloride infusion  10 mL/hr Intravenous  Once Henderly, Britni A, PA-C      . cefTRIAXone (ROCEPHIN) 1 g in sodium chloride 0.9 % 100 mL IVPB  1 g Intravenous Once Sabas Sous, MD      . LORazepam (ATIVAN) injection 0-4 mg  0-4 mg Intravenous Q6H Henderly, Britni A, PA-C       Or  . LORazepam (ATIVAN) tablet 0-4 mg  0-4 mg Oral Q6H Henderly, Britni A, PA-C      . [START ON 09/01/2019] LORazepam (ATIVAN) injection 0-4 mg  0-4 mg Intravenous Q12H Henderly, Britni A, PA-C       Or  . [START ON 09/01/2019] LORazepam (ATIVAN)  tablet 0-4 mg  0-4 mg Oral Q12H Henderly, Britni A, PA-C      . octreotide (SANDOSTATIN) 500 mcg in sodium chloride 0.9 % 250 mL (2 mcg/mL) infusion  50 mcg/hr Intravenous Continuous Sabas Sous, MD 25 mL/hr at 08/30/19 1525 50 mcg/hr at 08/30/19 1525  . thiamine tablet 100 mg  100 mg Oral Daily Henderly, Britni A, PA-C       Or  . thiamine (B-1) injection 100 mg  100 mg Intravenous Daily Henderly, Britni A, PA-C   100 mg at 08/30/19 1504   Current Outpatient Medications  Medication Sig Dispense Refill  . folic acid (FOLVITE) 1 MG tablet Take 1 tablet (1 mg total) by mouth daily.    . furosemide (LASIX) 20 MG tablet Take 20 mg by mouth daily.    . IRON PO Take 1 tablet by mouth daily.    Marland Kitchen lactulose (CHRONULAC) 10 GM/15ML solution Take 45 mLs (30 g total) by mouth 3 (three) times daily. 240 mL 0  . magnesium oxide (MAG-OX) 400 (241.3 Mg) MG tablet Take 1 tablet (400 mg total) by mouth 2 (two) times daily. 60 tablet 2  . Multiple Vitamin (MULTIVITAMIN WITH MINERALS) TABS tablet Take 1 tablet by mouth daily.    . nadolol (CORGARD) 20 MG tablet Take 20 mg by mouth daily.    Marland Kitchen omeprazole (PRILOSEC) 20 MG capsule Take 20 mg by mouth daily.    . potassium chloride SA (K-DUR,KLOR-CON) 20 MEQ tablet Take 20 mEq by mouth 2 (two) times daily.    . sertraline (ZOLOFT) 100 MG tablet Take 100 mg by mouth daily.    Marland Kitchen thiamine 100 MG tablet Take 1 tablet (100 mg total) by mouth daily.      Allergies as of 08/30/2019 - Review Complete 08/30/2019  Allergen Reaction Noted  . Penicillins Hives 05/26/2018    Family History  Problem Relation Age of Onset  . Peptic Ulcer Paternal Aunt   . Crohn's disease Maternal Uncle   . Heart attack Maternal Uncle   . Heart attack Maternal Grandfather   . Stroke Maternal Grandmother   . Stroke Paternal Grandfather     Social History   Socioeconomic History  . Marital status: Single    Spouse name: Not on file  . Number of children: Not on file  . Years of  education: Not on file  . Highest education level: Not on file  Occupational History  . Not on file  Tobacco Use  . Smoking status: Former Smoker    Packs/day: 1.00    Types: Cigarettes  . Smokeless tobacco: Never Used  Substance and Sexual Activity  . Alcohol use: Not Currently  . Drug use: Yes    Types: Marijuana  . Sexual activity: Not on file  Other Topics Concern  . Not on file  Social History Narrative  . Not on file   Social Determinants of Health   Financial Resource Strain:   . Difficulty of Paying Living Expenses:   Food Insecurity:   . Worried About Programme researcher, broadcasting/film/video in the Last Year:   . Barista in the Last Year:   Transportation Needs:   . Freight forwarder (Medical):   Marland Kitchen Lack of Transportation (Non-Medical):   Physical Activity:   . Days of Exercise per Week:   . Minutes of Exercise per Session:   Stress:   . Feeling of Stress :   Social Connections:   . Frequency of Communication with Friends and Family:   . Frequency of Social Gatherings with Friends and Family:   . Attends Religious Services:   . Active Member of Clubs or Organizations:   . Attends Banker Meetings:   Marland Kitchen Marital Status:   Intimate Partner Violence:   . Fear of Current or Ex-Partner:   . Emotionally Abused:   Marland Kitchen Physically Abused:   . Sexually Abused:     Review of Systems:  GI: Described in detail in HPI.    Gen: Denies any fever, chills, rigors, night sweats, anorexia, fatigue, weakness, malaise, involuntary weight loss, and sleep disorder CV: Denies chest pain, angina, palpitations, syncope, orthopnea, PND, peripheral edema, and claudication. Resp: Denies dyspnea, cough, sputum, wheezing, coughing up blood. GU : Denies urinary burning, blood in urine, urinary frequency, urinary hesitancy, nocturnal urination, and urinary incontinence. MS: Denies joint pain or swelling.  Denies muscle weakness, cramps, atrophy.  Derm: Denies rash, itching, oral  ulcerations, hives, unhealing ulcers.  Psych: Denies depression, anxiety, memory loss, suicidal ideation, hallucinations,  and confusion. Heme: Denies bruising, bleeding, and enlarged lymph nodes. Neuro:  Denies any headaches, dizziness, paresthesias. Endo:  Denies any problems with DM, thyroid, adrenal function.  Physical Exam: Vital signs in last 24 hours: Temp:  [98.2 F (36.8 C)-98.8 F (37.1 C)] 98.8 F (37.1 C) (05/04 1556) Pulse Rate:  [103-137] 104 (05/04 1556) Resp:  [11-31] 15 (05/04 1556) BP: (97-129)/(58-93) 125/90 (05/04 1556) SpO2:  [99 %-100 %] 100 % (05/04 1550) Weight:  [61.2 kg] 61.2 kg (05/04 1420)    General:   Alert,  Well-developed, well-nourished, pleasant and cooperative in NAD Emesis bag at bedside-currently empty  Head:  Normocephalic and atraumatic. Eyes:  Sclera clear, no icterus.   Conjunctiva pink. Ears:  Normal auditory acuity. Nose:  No deformity, discharge,  or lesions. Mouth:  No deformity or lesions.  Oropharynx pink & moist. Neck:  Supple; no masses or thyromegaly. Lungs:  Clear throughout to auscultation.   No wheezes, crackles, or rhonchi. No acute distress. Heart:  Regular rate and rhythm; no murmurs, clicks, rubs,  or gallops. Extremities:  Without clubbing,no pedal edema Neurologic:  Alert and  oriented x4;  grossly normal neurologically.  No asterixis Skin:  Intact without significant lesions or rashes. Psych:  Alert and cooperative. Normal mood and affect. Abdomen:  Soft, nontender and nondistended.  Small umbilical hernia , normal bowel sounds, without guarding, and without rebound.   No evidence of shifting dullness or fluid thrill.     Lab Results: Recent Labs    08/30/19 1432 08/30/19 1501  WBC 18.9*  --   HGB 11.3* 10.5*  HCT 33.7* 31.0*  PLT 267  --    BMET Recent Labs    08/30/19 1432 08/30/19 1501  NA 140 140  K 5.9* 5.8*  CL 101 102  CO2 22  --   GLUCOSE 134* 121*  BUN 17 17  CREATININE 1.16 1.10  CALCIUM  8.5*  --    LFT Recent Labs    08/30/19 1432  PROT 6.6  ALBUMIN 2.9*  AST 288*  ALT 96*  ALKPHOS 66  BILITOT 4.7*   PT/INR Recent Labs    08/30/19 1445  LABPROT 21.4*  INR 1.9*    Studies/Results: DG Chest Portable 1 View  Result Date: 08/30/2019 CLINICAL DATA:  Shortness of breath. EXAM: PORTABLE CHEST 1 VIEW COMPARISON:  05/26/2018. FINDINGS: Mediastinum and hilar structures normal. Lungs are clear. No pleural effusion or pneumothorax. Heart size normal. Thoracic spine scoliosis concave left. No acute bony abnormality identified. IMPRESSION: No acute cardiopulmonary disease. Electronically Signed   By: Marcello Moores  Register   On: 08/30/2019 15:12    Impression: Hematemesis-history of bleeding from esophageal varices, last EGD performed in 07/2015, bands and IV sclerosant used with plans for TIPS if recurrent bleeding noted Alcohol related cirrhosis, recent alcohol use  Hemoglobin 11.3 on presentation, 10.5 on repeat blood work Normal platelets 267 T bili/AST/ALT/ALP of four-point 7/288/96/66 compatible with alcoholic hepatitis PT/INR of 21.4/1.9 MELDNa of 20 Hepatic discriminant function of 48  SARS Coronavirus 2 by RT PCR NEGATIVE    Plan: Keep patient n.p.o. Will give metoclopramide 10 mg IV 1 dose. Continue IV octreotide drip at 50 mcg/h, has received octreotide 100 mcg IV bolus x1 and Protonix 40 mg IV x1. Plan EGD for variceal banding.  Continue thiamine, folic acid and multivitamin Continue monitoring for alcohol withdrawal  Prognosis guarded    LOS: 0 days   Ronnette Juniper, MD  08/30/2019, 4:24 PM

## 2019-08-30 NOTE — H&P (Signed)
NAME:  [name], MRN:  [id number], DOB:  Nov 09, 1980, LOS: 0 ADMISSION DATE:  08/30/2019, CONSULTATION DATE:  08/30/19 REFERRING MD:  Ed physician, CHIEF COMPLAINT:  hypotension   Brief History   39 yo male with h/o etoh related cirrhosis (last drink 2 days ago) presents with 6 hours of hematemesis.   History of present illness   39 yo with pmh etoh related cirrhosis, esophageal varices, gerd presented to ed with his mother after hematemesis for past 6 hours. Pt's mother endorses large volume bleeding with bright red blood and clots. Pt states last drink was 2 days ago when he drank a pint of vodka. He reports compliance with his medications. He denies hematochezia. + dizziness and lethargy. Feels a bit "shakey" has not had DT per pt and mother defers to him. 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 Pt has intermittently stopped drinking since 2017 and states that all he would say is "I had a bad hangover for a couple days, but no withdrawal".   Upon presentation he was noted to be tachycardic and hypotensive. Improved with ivf and blood transfusion. He was started on octreotide gtt and GI has been consulted.   Past Medical History   Past Medical History:  Diagnosis Date  . Cirrhosis of liver (HCC)   . Esophageal varices with hemorrhage (HCC)   . ETOH abuse   . GERD (gastroesophageal reflux disease)     Significant Hospital Events   5/4: pending GI upper endoscopy  Consults:  5/4 GI  Procedures:  5/4 Upper endoscopy:   Significant Diagnostic Tests:    Micro Data:  5/4 sars2: neg 5/4 flu: neg 5/4 blood:   Antimicrobials:  Ceftriaxone 5/4->  Interim history/subjective:  5/4: admitting for gi bleed and hypotension  Objective   Blood pressure (!) 141/84, pulse (!) 107, temperature 98.8 F (37.1 C), temperature source Oral, resp. rate 19, height 5\' 7"  (1.702 m), weight 61.2 kg, SpO2 100 %.        Intake/Output Summary  (Last 24 hours) at 08/30/2019 1633 Last data filed at 08/30/2019 1632 Gross per 24 hour  Intake 2000 ml  Output 200 ml  Net 1800 ml   Filed Weights   08/30/19 1420  Weight: 61.2 kg    Examination: General: no acute distress, sitting up in bed, appears comfortable HEENT: NCAT, EOMI, glasses in place, sclera anicteric, PERRLA, MMMP Lungs: CTA, no wheezes, rhonchi, rales Cardiovascular: Reg rhythm, tachycardic, no m/g/r Abdomen: soft, NT,ND, BS+ Extremities: no c/c/e Skin: no rashes, warm and dry Neuro: moves all 4 extremities, alert and oriented x4.  GU: deferred  Resolved Hospital Problem list     Assessment & Plan:  Shock, hemorrhagic:  2/2 acute upper gi bleed -serial h&h -f/u Gi recs -cont octreotide -volume resuscitate -transfuse <7  Upper GI bleed:  -history of such with known esophageal varices -octreotide gtt per GI -cont ceftriaxone -ppi -pending endoscopy -trend H&H etoh cirrhosis:  -follow transaminitis, elevated -of course pt should cease alcohol consumption immediately and indefinitely.  -resume home meds when able to take po Gerd:  -ppi H/o hepatic encephalopathy:  -monitor -resume home meds when able to take po Coagulopathy:  -2/2 above, INR 1.9 -give 1 U ffp  etoh abuse:  -as above -monitor for withdraw -ciwa -thiamine/folate  Hyperkalemia:  -unable to give lokelma at this time and BP preventing lasix -will temporize and give NS -calcium gluconate -hold home  potassium Lactic acidosis: -trend and volume resuscitate Best practice:  Diet: npo Pain/Anxiety/Delirium protocol (if indicated): ciwa VAP protocol (if indicated): n/a DVT prophylaxis: scd GI prophylaxis: ppi Glucose control: monitor Mobility: bedrest Code Status: full Family Communication: mother at bedside 5/4 Disposition: ICU   Labs   CBC: Recent Labs  Lab 08/30/19 1432 08/30/19 1501  WBC 18.9*  --   HGB 11.3* 10.5*  HCT 33.7* 31.0*  MCV 100.0  --   PLT 267  --      Basic Metabolic Panel: Recent Labs  Lab 08/30/19 1432 08/30/19 1501  NA 140 140  K 5.9* 5.8*  CL 101 102  CO2 22  --   GLUCOSE 134* 121*  BUN 17 17  CREATININE 1.16 1.10  CALCIUM 8.5*  --    GFR: Estimated Creatinine Clearance: 78 mL/min (by C-G formula based on SCr of 1.1 mg/dL). Recent Labs  Lab 08/30/19 1432 08/30/19 1450  WBC 18.9*  --   LATICACIDVEN  --  8.0*    Liver Function Tests: Recent Labs  Lab 08/30/19 1432  AST 288*  ALT 96*  ALKPHOS 66  BILITOT 4.7*  PROT 6.6  ALBUMIN 2.9*   No results for input(s): LIPASE, AMYLASE in the last 168 hours. Recent Labs  Lab 08/30/19 1450  AMMONIA 47*    ABG    Component Value Date/Time   PHART 7.235 (L) 08/22/2015 0734   PCO2ART 32.6 (L) 08/22/2015 0734   PO2ART 133 (H) 08/22/2015 0734   HCO3 13.3 (L) 08/22/2015 0734   TCO2 22 08/30/2019 1501   ACIDBASEDEF 12.7 (H) 08/22/2015 0734   O2SAT 98.6 08/22/2015 0734     Coagulation Profile: Recent Labs  Lab 08/30/19 1445  INR 1.9*    Cardiac Enzymes: No results for input(s): CKTOTAL, CKMB, CKMBINDEX, TROPONINI in the last 168 hours.  HbA1C: No results found for: HGBA1C  CBG: No results for input(s): GLUCAP in the last 168 hours.  Review of Systems:   + dizziness, lethargy and weakness + hematemesis, no hematochezia.   Past Medical History  He,  has a past medical history of Cirrhosis of liver ([place]), Esophageal varices with hemorrhage ([place]), ETOH abuse, and GERD (gastroesophageal reflux disease).   Surgical History    Past Surgical History:  Procedure Laterality Date  . ESOPHAGOGASTRODUODENOSCOPY Left 08/21/2015   Procedure: ESOPHAGOGASTRODUODENOSCOPY (EGD);  Surgeon: Wilford Corner, MD;  Location: [facility];  Service: Endoscopy;  Laterality: Left;     Social History   reports that he has quit smoking. His smoking use included cigarettes. He smoked 1.00 pack per day. He has never used smokeless tobacco. He reports previous alcohol use.  He reports current drug use. Drug: Marijuana.   Family History   His family history includes Crohn's disease in his maternal uncle; Heart attack in his maternal grandfather and maternal uncle; Peptic Ulcer in his paternal aunt; Stroke in his maternal grandmother and paternal grandfather.   Allergies Allergies  Allergen Reactions  . Penicillins Hives     Home Medications  Prior to Admission medications   Medication Sig Start Date End Date Taking? Authorizing Provider  folic acid (FOLVITE) 1 MG tablet Take 1 tablet (1 mg total) by mouth daily. 07/19/19  Yes Johnson, Clanford L, MD  furosemide (LASIX) 20 MG tablet Take 20 mg by mouth daily. 04/27/19  Yes [provider]  IRON PO Take 1 tablet by mouth daily.   Yes [provider]  lactulose (CHRONULAC) 10 GM/15ML solution Take 45 mLs (30 g  total) by mouth 3 (three) times daily. 09/08/15  Yes Jerald Kief, MD  magnesium oxide (MAG-OX) 400 (241.3 Mg) MG tablet Take 1 tablet (400 mg total) by mouth 2 (two) times daily. 11/15/18  Yes Pokhrel, Laxman, MD  Multiple Vitamin (MULTIVITAMIN WITH MINERALS) TABS tablet Take 1 tablet by mouth daily. 07/19/19  Yes Johnson, Clanford L, MD  nadolol (CORGARD) 20 MG tablet Take 20 mg by mouth daily. 05/23/18  Yes [provider]  omeprazole (PRILOSEC) 20 MG capsule Take 20 mg by mouth daily.   Yes [provider]  potassium chloride SA (K-DUR,KLOR-CON) 20 MEQ tablet Take 20 mEq by mouth 2 (two) times daily. 05/26/18  Yes [provider]  sertraline (ZOLOFT) 100 MG tablet Take 100 mg by mouth daily. 07/19/19  Yes [provider]  thiamine 100 MG tablet Take 1 tablet (100 mg total) by mouth daily. 07/19/19  Yes Johnson, Clanford L, MD     Critical care time: The patient is critically ill with multiple organ systems failure and requires high complexity decision making for assessment and support, frequent evaluation and titration of therapies, application of  advanced monitoring technologies and extensive interpretation of multiple databases.  Critical care time 38 mins. This represents my time independent of the NP's/PA's/med students/residents time taking care of the pt. This is excluding procedures.     Briant Sites DO [facility] 08/30/2019, 4:33 PM

## 2019-08-30 NOTE — H&P (View-Only) (Signed)
[facility] Consult  Referring Provider: ER physician Primary Care Physician:  Farris Has, MD Primary Gastroenterologist: Henrietta Dine dismissed from Dr.Schooler's practice due to non compliance)  Reason for Consultation: Hematemesis  HPI: [name] is a 39 y.o. male with history of alcohol related cirrhosis, last alcohol use 2 days ago, states he drank 1 pint of vodka, presents to the ED with 6 hours of vomiting blood. Patient's mother who lives with him is present at bedside and states that he filled an entire canister, almost a gallon, with bright red blood and clots. Patient states that he has been taking nadolol 20 mg daily for history of esophageal varices along with furosemide 20 mg daily for history of ascites and lactulose about 45 to 60 mL daily for history of hepatic encephalopathy. In the past he has also been seen for liver transplant evaluation, however has relapsed from alcohol abuse and continues to drink intermittently.  Patient has history of significant alcohol use since the age of 39. He denies noticing black stool or bloody bowel movements. In the ER he was found to have low blood pressure, tachycardia with a heart rate of 130, has been given 1 unit of PRBC, has IV octreotide drip, second unit of PRBC has been started.   Past Medical History:  Diagnosis Date  . Cirrhosis of liver (HCC)   . Esophageal varices with hemorrhage (HCC)   . ETOH abuse   . GERD (gastroesophageal reflux disease)     Past Surgical History:  Procedure Laterality Date  . ESOPHAGOGASTRODUODENOSCOPY Left 08/21/2015   Procedure: ESOPHAGOGASTRODUODENOSCOPY (EGD);  Surgeon: Charlott Rakes, MD;  Location: [facility];  Service: Endoscopy;  Laterality: Left;    Prior to Admission medications   Medication Sig Start Date End Date Taking? Authorizing Provider  folic acid (FOLVITE) 1 MG tablet Take 1 tablet (1 mg total) by mouth daily. 07/19/19  Yes Johnson, Clanford L, MD   furosemide (LASIX) 20 MG tablet Take 20 mg by mouth daily. 04/27/19  Yes [provider]  IRON PO Take 1 tablet by mouth daily.   Yes [provider]  lactulose (CHRONULAC) 10 GM/15ML solution Take 45 mLs (30 g total) by mouth 3 (three) times daily. 09/08/15  Yes Jerald Kief, MD  magnesium oxide (MAG-OX) 400 (241.3 Mg) MG tablet Take 1 tablet (400 mg total) by mouth 2 (two) times daily. 11/15/18  Yes Pokhrel, Laxman, MD  Multiple Vitamin (MULTIVITAMIN WITH MINERALS) TABS tablet Take 1 tablet by mouth daily. 07/19/19  Yes Johnson, Clanford L, MD  nadolol (CORGARD) 20 MG tablet Take 20 mg by mouth daily. 05/23/18  Yes [provider]  omeprazole (PRILOSEC) 20 MG capsule Take 20 mg by mouth daily.   Yes [provider]  potassium chloride SA (K-DUR,KLOR-CON) 20 MEQ tablet Take 20 mEq by mouth 2 (two) times daily. 05/26/18  Yes [provider]  sertraline (ZOLOFT) 100 MG tablet Take 100 mg by mouth daily. 07/19/19  Yes [provider]  thiamine 100 MG tablet Take 1 tablet (100 mg total) by mouth daily. 07/19/19  Yes Cleora Fleet, MD    Current Facility-Administered Medications  Medication Dose Route Frequency Provider Last Rate Last Admin  . 0.9 %  sodium chloride infusion   Intravenous Continuous Henderly, Britni A, PA-C      . 0.9 %  sodium chloride infusion  10 mL/hr Intravenous Once Henderly, Britni A, PA-C      . 0.9 %  sodium chloride infusion  10  mL/hr Intravenous Once Henderly, Britni A, PA-C      . cefTRIAXone (ROCEPHIN) 1 g in sodium chloride 0.9 % 100 mL IVPB  1 g Intravenous Once Maudie Flakes, MD      . LORazepam (ATIVAN) injection 0-4 mg  0-4 mg Intravenous Q6H Henderly, Britni A, PA-C       Or  . LORazepam (ATIVAN) tablet 0-4 mg  0-4 mg Oral Q6H Henderly, Britni A, PA-C      . [START ON 09/01/2019] LORazepam (ATIVAN) injection 0-4 mg  0-4 mg Intravenous Q12H Henderly, Britni A, PA-C       Or  . [START ON 09/01/2019] LORazepam  (ATIVAN) tablet 0-4 mg  0-4 mg Oral Q12H Henderly, Britni A, PA-C      . octreotide (SANDOSTATIN) 500 mcg in sodium chloride 0.9 % 250 mL (2 mcg/mL) infusion  50 mcg/hr Intravenous Continuous Maudie Flakes, MD 25 mL/hr at 08/30/19 1525 50 mcg/hr at 08/30/19 1525  . thiamine tablet 100 mg  100 mg Oral Daily Henderly, Britni A, PA-C       Or  . thiamine (B-1) injection 100 mg  100 mg Intravenous Daily Henderly, Britni A, PA-C   100 mg at 08/30/19 1504   Current Outpatient Medications  Medication Sig Dispense Refill  . folic acid (FOLVITE) 1 MG tablet Take 1 tablet (1 mg total) by mouth daily.    . furosemide (LASIX) 20 MG tablet Take 20 mg by mouth daily.    . IRON PO Take 1 tablet by mouth daily.    Marland Kitchen lactulose (CHRONULAC) 10 GM/15ML solution Take 45 mLs (30 g total) by mouth 3 (three) times daily. 240 mL 0  . magnesium oxide (MAG-OX) 400 (241.3 Mg) MG tablet Take 1 tablet (400 mg total) by mouth 2 (two) times daily. 60 tablet 2  . Multiple Vitamin (MULTIVITAMIN WITH MINERALS) TABS tablet Take 1 tablet by mouth daily.    . nadolol (CORGARD) 20 MG tablet Take 20 mg by mouth daily.    Marland Kitchen omeprazole (PRILOSEC) 20 MG capsule Take 20 mg by mouth daily.    . potassium chloride SA (K-DUR,KLOR-CON) 20 MEQ tablet Take 20 mEq by mouth 2 (two) times daily.    . sertraline (ZOLOFT) 100 MG tablet Take 100 mg by mouth daily.    Marland Kitchen thiamine 100 MG tablet Take 1 tablet (100 mg total) by mouth daily.      Allergies as of 08/30/2019 - Review Complete 08/30/2019  Allergen Reaction Noted  . Penicillins Hives 05/26/2018    Family History  Problem Relation Age of Onset  . Peptic Ulcer Paternal Aunt   . Crohn's disease Maternal Uncle   . Heart attack Maternal Uncle   . Heart attack Maternal Grandfather   . Stroke Maternal Grandmother   . Stroke Paternal Grandfather     Social History   Socioeconomic History  . Marital status: Single    Spouse name: Not on file  . Number of children: Not on file  .  Years of education: Not on file  . Highest education level: Not on file  Occupational History  . Not on file  Tobacco Use  . Smoking status: Former Smoker    Packs/day: 1.00    Types: Cigarettes  . Smokeless tobacco: Never Used  Substance and Sexual Activity  . Alcohol use: Not Currently  . Drug use: Yes    Types: Marijuana  . Sexual activity: Not on file  Other Topics Concern  . Not on  file  Social History Narrative  . Not on file   Social Determinants of Health   Financial Resource Strain:   . Difficulty of Paying Living Expenses:   Food Insecurity:   . Worried About Programme researcher, broadcasting/film/video in the Last Year:   . Barista in the Last Year:   Transportation Needs:   . Freight forwarder (Medical):   Marland Kitchen Lack of Transportation (Non-Medical):   Physical Activity:   . Days of Exercise per Week:   . Minutes of Exercise per Session:   Stress:   . Feeling of Stress :   Social Connections:   . Frequency of Communication with Friends and Family:   . Frequency of Social Gatherings with Friends and Family:   . Attends Religious Services:   . Active Member of Clubs or Organizations:   . Attends Banker Meetings:   Marland Kitchen Marital Status:   Intimate Partner Violence:   . Fear of Current or Ex-Partner:   . Emotionally Abused:   Marland Kitchen Physically Abused:   . Sexually Abused:     Review of Systems:  GI: Described in detail in HPI.    Gen: Denies any fever, chills, rigors, night sweats, anorexia, fatigue, weakness, malaise, involuntary weight loss, and sleep disorder CV: Denies chest pain, angina, palpitations, syncope, orthopnea, PND, peripheral edema, and claudication. Resp: Denies dyspnea, cough, sputum, wheezing, coughing up blood. GU : Denies urinary burning, blood in urine, urinary frequency, urinary hesitancy, nocturnal urination, and urinary incontinence. MS: Denies joint pain or swelling.  Denies muscle weakness, cramps, atrophy.  Derm: Denies rash, itching,  oral ulcerations, hives, unhealing ulcers.  Psych: Denies depression, anxiety, memory loss, suicidal ideation, hallucinations,  and confusion. Heme: Denies bruising, bleeding, and enlarged lymph nodes. Neuro:  Denies any headaches, dizziness, paresthesias. Endo:  Denies any problems with DM, thyroid, adrenal function.  Physical Exam: Vital signs in last 24 hours: Temp:  [98.2 F (36.8 C)-98.8 F (37.1 C)] 98.8 F (37.1 C) (05/04 1556) Pulse Rate:  [103-137] 104 (05/04 1556) Resp:  [11-31] 15 (05/04 1556) BP: (97-129)/(58-93) 125/90 (05/04 1556) SpO2:  [99 %-100 %] 100 % (05/04 1550) Weight:  [61.2 kg] 61.2 kg (05/04 1420)    General:   Alert,  Well-developed, well-nourished, pleasant and cooperative in NAD Emesis bag at bedside-currently empty  Head:  Normocephalic and atraumatic. Eyes:  Sclera clear, no icterus.   Conjunctiva pink. Ears:  Normal auditory acuity. Nose:  No deformity, discharge,  or lesions. Mouth:  No deformity or lesions.  Oropharynx pink & moist. Neck:  Supple; no masses or thyromegaly. Lungs:  Clear throughout to auscultation.   No wheezes, crackles, or rhonchi. No acute distress. Heart:  Regular rate and rhythm; no murmurs, clicks, rubs,  or gallops. Extremities:  Without clubbing,no pedal edema Neurologic:  Alert and  oriented x4;  grossly normal neurologically.  No asterixis Skin:  Intact without significant lesions or rashes. Psych:  Alert and cooperative. Normal mood and affect. Abdomen:  Soft, nontender and nondistended.  Small umbilical hernia , normal bowel sounds, without guarding, and without rebound.   No evidence of shifting dullness or fluid thrill.     Lab Results: Recent Labs    08/30/19 1432 08/30/19 1501  WBC 18.9*  --   HGB 11.3* 10.5*  HCT 33.7* 31.0*  PLT 267  --    BMET Recent Labs    08/30/19 1432 08/30/19 1501  NA 140 140  K 5.9* 5.8*  CL 101  102  CO2 22  --   GLUCOSE 134* 121*  BUN 17 17  CREATININE 1.16 1.10   CALCIUM 8.5*  --    LFT Recent Labs    08/30/19 1432  PROT 6.6  ALBUMIN 2.9*  AST 288*  ALT 96*  ALKPHOS 66  BILITOT 4.7*   PT/INR Recent Labs    08/30/19 1445  LABPROT 21.4*  INR 1.9*    Studies/Results: DG Chest Portable 1 View  Result Date: 08/30/2019 CLINICAL DATA:  Shortness of breath. EXAM: PORTABLE CHEST 1 VIEW COMPARISON:  05/26/2018. FINDINGS: Mediastinum and hilar structures normal. Lungs are clear. No pleural effusion or pneumothorax. Heart size normal. Thoracic spine scoliosis concave left. No acute bony abnormality identified. IMPRESSION: No acute cardiopulmonary disease. Electronically Signed   By: Maisie Fus  Register   On: 08/30/2019 15:12    Impression: Hematemesis-history of bleeding from esophageal varices, last EGD performed in 07/2015, bands and IV sclerosant used with plans for TIPS if recurrent bleeding noted Alcohol related cirrhosis, recent alcohol use  Hemoglobin 11.3 on presentation, 10.5 on repeat blood work Normal platelets 267 T bili/AST/ALT/ALP of four-point 7/288/96/66 compatible with alcoholic hepatitis PT/INR of 21.4/1.9 MELDNa of 20 Hepatic discriminant function of 48  SARS Coronavirus 2 by RT PCR NEGATIVE    Plan: Keep patient n.p.o. Will give metoclopramide 10 mg IV 1 dose. Continue IV octreotide drip at 50 mcg/h, has received octreotide 100 mcg IV bolus x1 and Protonix 40 mg IV x1. Plan EGD for variceal banding.  Continue thiamine, folic acid and multivitamin Continue monitoring for alcohol withdrawal  Prognosis guarded    LOS: 0 days   Kerin Salen, MD  08/30/2019, 4:24 PM

## 2019-08-30 NOTE — ED Triage Notes (Signed)
Pt arrives via [facility] from home for c/o vomiting with hematemesis, pt reports multiple episodes of vomiting, BP low at home when family took it, given 500cc NS, bp improved to 100/60 with EMS, HR 130s, hx of cirrhosis, denies sob or chest pain. 18G IV LFA.

## 2019-08-30 NOTE — ED Notes (Signed)
Pt transported to endo.  

## 2019-08-30 NOTE — Op Note (Signed)
[facility] Patient Name: [name] Procedure Date : 08/30/2019 MRN: [id number] Attending MD: Kerin Salen , MD Date of Birth: 05/03/1980 CSN: [id number] Age: 39 Admit Type: Outpatient Procedure:                Upper GI endoscopy Indications:              Hematemesis, For therapy of esophageal varices Providers:                Kerin Salen, MD, Norman Clay, RN, Koren Bound,                            Technician Referring MD:             ER/Critical Care Medicines:                Monitored Anesthesia Care Complications:            No immediate complications. Estimated blood loss:                            None. Estimated Blood Loss:     Estimated blood loss: none. Procedure:                Pre-Anesthesia Assessment:                           - Prior to the procedure, a History and Physical                            was performed, and patient medications and                            allergies were reviewed. The patient's tolerance of                            previous anesthesia was also reviewed. The risks                            and benefits of the procedure and the sedation                            options and risks were discussed with the patient.                            All questions were answered, and informed consent                            was obtained. Prior Anticoagulants: The patient has                            taken no previous anticoagulant or antiplatelet                            agents. ASA Grade Assessment: III - A patient with  severe systemic disease. After reviewing the risks                            and benefits, the patient was deemed in                            satisfactory condition to undergo the procedure.                           After obtaining informed consent, the endoscope was                            passed under direct vision. Throughout the                            procedure, the  patient's blood pressure, pulse, and                            oxygen saturations were monitored continuously. The                            GIF-H190 ([id number]) Olympus gastroscope was                            introduced through the mouth, and advanced to the                            second part of duodenum. The upper GI endoscopy was                            accomplished without difficulty. The patient                            tolerated the procedure well. Scope In: Scope Out: Findings:      Grade III, large (> 5 mm) varices were found in the upper third of the       esophagus, in the middle third of the esophagus and in the lower third       of the esophagus.      They were large in size.      There was evidence of recent bleeding with red wale sign.      Six bands were successfully placed with complete eradication, resulting       in deflation of varices. The seventh band did not stay in place. There       was no bleeding during and at the end of the procedure.      A single 4 mm polypoid area with no bleeding was found, most likely a       benign papilloma.      The Z-line was regular and was found 36 cm from the incisors.      Severe portal hypertensive gastropathy was found in the gastric body.      Possible varices with no bleeding were found in the gastric fundus.      The examined duodenum was normal. Impression:               -  Grade III and large (> 5 mm) esophageal varices.                            Completely eradicated. Banded.                           - Esophageal polyp- likely papilloma was found in                            mid esophagus.                           - Z-line regular, 36 cm from the incisors.                           - Portal hypertensive gastropathy.                           - Possible gastric varices, without bleeding.                           - Normal examined duodenum.                           - No specimens collected. Moderate  Sedation:      Patient did not receive moderate sedation for this procedure, but       instead received monitored anesthesia care. Recommendation:           - Clear liquid diet.                           - Continue octreotide infusion of 50 micrograms per                            hour for 72 hours.                           - Monitor H and H and transfuse if HB is less than                            7, transfuse FFP is there is evidence of ongoing                            bleeding.                           - Consider TIPS if bleeding resumes or continues. Procedure Code(s):        --- Professional ---                           [phone], Esophagogastroduodenoscopy, flexible,                            transoral; with band ligation of esophageal/gastric  varices Diagnosis Code(s):        --- Professional ---                           I85.00, Esophageal varices without bleeding                           K22.8, Other specified diseases of esophagus                           K76.6, Portal hypertension                           K31.89, Other diseases of stomach and duodenum                           I86.4, Gastric varices                           K92.0, Hematemesis CPT copyright 2019 American Medical Association. All rights reserved. The codes documented in this report are preliminary and upon coder review may  be revised to meet current compliance requirements. Ronnette Juniper, MD 08/30/2019 6:23:29 PM This report has been signed electronically. Number of Addenda: 0

## 2019-08-30 NOTE — Anesthesia Preprocedure Evaluation (Addendum)
Anesthesia Evaluation  Patient identified by MRN, date of birth, ID band Patient awake    Reviewed: Allergy & Precautions, NPO status , Patient's Chart, lab work & pertinent test results, reviewed documented beta blocker date and time   History of Anesthesia Complications Negative for: history of anesthetic complications  Airway Mallampati: II  TM Distance: >3 FB Neck ROM: Full    Dental  (+) Poor Dentition, Dental Advisory Given   Pulmonary former smoker,  08/30/2019 SARS coronavirus NEG   breath sounds clear to auscultation       Cardiovascular hypertension, Pt. on home beta blockers and Pt. on medications (-) angina Rhythm:Regular Rate:Normal  '17 ECHO: EF 60-65%, valves OK   Neuro/Psych PSYCHIATRIC DISORDERS (ETOH abuse) sometimes very tremulous without ETOH Last ETOH 3d ago    GI/Hepatic GERD  Medicated and Poorly Controlled,(+) Cirrhosis  (GI bleed)  Esophageal Varices  substance abuse  alcohol use and marijuana use, Elevated LFTs   Endo/Other  negative endocrine ROS  Renal/GU negative Renal ROS     Musculoskeletal   Abdominal   Peds  Hematology  (+) Blood dyscrasia (Hb 10.5), anemia , INR 1.9   Anesthesia Other Findings   Reproductive/Obstetrics                            Anesthesia Physical Anesthesia Plan  ASA: III and emergent  Anesthesia Plan: General   Post-op Pain Management:    Induction: Intravenous and Rapid sequence  PONV Risk Score and Plan: 2 and Ondansetron, Dexamethasone and Treatment may vary due to age or medical condition  Airway Management Planned: Oral ETT  Additional Equipment:   Intra-op Plan:   Post-operative Plan: Possible Post-op intubation/ventilation  Informed Consent: I have reviewed the patients History and Physical, chart, labs and discussed the procedure including the risks, benefits and alternatives for the proposed anesthesia with the  patient or authorized representative who has indicated his/her understanding and acceptance.     Dental advisory given  Plan Discussed with: CRNA and Surgeon  Anesthesia Plan Comments: (Last B-blocker 2 day ago b/c pt vomiting, with acute blood loss, will continue to hold B-blocker)       Anesthesia Quick Evaluation

## 2019-08-30 NOTE — Interval H&P Note (Signed)
History and Physical Interval Note: 38/male with alcoholic cirrhosis, esophageal varices, active alcohol use, presented with hematemesis.  08/30/2019 5:42 PM  [name]  has presented today for EGD with possible banding, with the diagnosis of hematemesis.  The various methods of treatment have been discussed with the patient and family. After consideration of risks, benefits and other options for treatment, the patient has consented to  Procedure(s): ESOPHAGOGASTRODUODENOSCOPY (EGD) WITH PROPOFOL (N/A) as a surgical intervention.  The patient's history has been reviewed, patient examined, no change in status, stable for surgery.  I have reviewed the patient's chart and labs.  Questions were answered to the patient's satisfaction.     Kerin Salen

## 2019-08-30 NOTE — Transfer of Care (Signed)
Immediate Anesthesia Transfer of Care Note  Patient: [name] [facility]  Procedure(s) Performed: ESOPHAGOGASTRODUODENOSCOPY (EGD) WITH PROPOFOL (N/A ) ESOPHAGEAL BANDING  Patient Location: PACU  Anesthesia Type:General  Level of Consciousness: awake, alert , oriented and patient cooperative  Airway & Oxygen Therapy: Patient Spontanous Breathing and Patient connected to nasal cannula oxygen  Post-op Assessment: Report given to RN and Post -op Vital signs reviewed and stable  Post vital signs: Reviewed and stable  Last Vitals:  Vitals Value Taken Time  BP 126/92 08/30/19 1827  Temp 36.7 C 08/30/19 1827  Pulse 101 08/30/19 1837  Resp 17 08/30/19 1837  SpO2 98 % 08/30/19 1837  Vitals shown include unvalidated device data.  Last Pain:  Vitals:   08/30/19 1827  TempSrc:   PainSc: 0-No pain         Complications: No apparent anesthesia complications

## 2019-08-30 NOTE — Brief Op Note (Signed)
08/30/2019  6:23 PM  PATIENT:  [name]  39 y.o. male  PRE-OPERATIVE DIAGNOSIS:  hematemesis  POST-OPERATIVE DIAGNOSIS:  esophageal varices, portal gastric hypertrophy  PROCEDURE:  Procedure(s): ESOPHAGOGASTRODUODENOSCOPY (EGD) WITH PROPOFOL (N/A) ESOPHAGEAL BANDING  SURGEON:  Surgeon(s) and Role:    Ronnette Juniper, MD - Primary  PHYSICIAN ASSISTANT:   ASSISTANTS: Hayden Rasmussen, Duy Nguyen,Tech   ANESTHESIA:   MAC  EBL:  None  BLOOD ADMINISTERED:none  DRAINS: none   LOCAL MEDICATIONS USED:  NONE  SPECIMEN:  No Specimen  DISPOSITION OF SPECIMEN:  N/A  COUNTS:  YES  TOURNIQUET:  * No tourniquets in log *  DICTATION: .Dragon Dictation  PLAN OF CARE: Admit to inpatient   PATIENT DISPOSITION:  PACU - hemodynamically stable.   Delay start of Pharmacological VTE agent (>24hrs) due to surgical blood loss or risk of bleeding: yes

## 2019-08-30 NOTE — Anesthesia Postprocedure Evaluation (Signed)
Anesthesia Post Note  Patient: Sohum Delillo [facility]  Procedure(s) Performed: ESOPHAGOGASTRODUODENOSCOPY (EGD) WITH PROPOFOL (N/A ) ESOPHAGEAL BANDING     Patient location during evaluation: PACU Anesthesia Type: General Level of consciousness: awake and alert, oriented and patient cooperative Pain management: pain level controlled Vital Signs Assessment: post-procedure vital signs reviewed and stable Respiratory status: spontaneous breathing, nonlabored ventilation and respiratory function stable Cardiovascular status: blood pressure returned to baseline and stable Postop Assessment: no apparent nausea or vomiting Anesthetic complications: no    Last Vitals:  Vitals:   08/30/19 1744 08/30/19 1827  BP:  (!) 126/92  Pulse: 94 (!) 111  Resp: 14 14  Temp: 37.4 C 36.7 C  SpO2:  96%    Last Pain:  Vitals:   08/30/19 1827  TempSrc:   PainSc: 0-No pain                 Teiana Hajduk,E. Dream Nodal

## 2019-08-30 NOTE — ED Provider Notes (Signed)
[facility] EMERGENCY DEPARTMENT Provider Note   CSN: [id number] Arrival date & time: 08/30/19  1419    History Chief Complaint  Patient presents with  . Emesis   [name] is a 39 y.o. male with past medical history significant for cirrhosis, esophageal variceal bleed, EtOH abuse with prior history of DTs and withdrawal seizures who presents for evaluation of hematemesis.  Patient states starting this morning he has thrown up approximately 1.5 L of bright red blood with clots.  He denies any chest pain or shortness of breath.  States he feels lightheaded and weak.  Will occasionally have all over "tingling."  He denies any unilateral weakness, facial droop, sudden onset headache.  Last drink of alcohol was 2 days ago, 1 L of vodka.  Was previously seen by [facility].  Had appointment this week however did not show for this.  Denies any melena or bright red blood per rectum.  Admits to some mild lightheadedness, no chest pain, shortness of breath, abdominal pain, diarrhea, dysuria.  Has not take anything for symptoms.  Denies additional aggravating or alleviating factors.  Per EMS patient on arrival was hypotensive and tachycardic.  Was given 500 cc bolus.  Bright red bloody emesis.  Mentating appropriately.  On arrival to the ED patient hypotensive and tachycardic.  Level 5 caveat 2/2 acuity of condition   HPI     Past Medical History:  Diagnosis Date  . Cirrhosis of liver (HCC)   . Esophageal varices with hemorrhage (HCC)   . ETOH abuse   . GERD (gastroesophageal reflux disease)     Patient Active Problem List   Diagnosis Date Noted  . Electrolyte disturbance 07/16/2019  . Lactic acidosis 07/16/2019  . Hyperammonemia (HCC) 07/16/2019  . Hyperbilirubinemia 07/16/2019  . Tachycardia 11/14/2018  . Acute encephalopathy   . Acute upper GI bleed   . Ascites   . Distended abdomen   . Hemorrhagic shock (HCC)   . Encounter for palliative care   . Goals of  care, counseling/discussion   . Encephalopathy acute 08/27/2015  . Alcoholic cirrhosis (HCC)   . Acute hypoxemic respiratory failure (HCC)   . SBP (spontaneous bacterial peritonitis) (HCC)   . Hypocalcemia 08/21/2015  . Hypomagnesemia 08/21/2015  . Acute blood loss anemia 08/20/2015  . Acute GI bleeding 08/20/2015  . ETOH abuse 08/20/2015  . Elevated INR 08/20/2015  . Hypoalbuminemia 08/20/2015  . Hypokalemia 08/20/2015    Past Surgical History:  Procedure Laterality Date  . ESOPHAGOGASTRODUODENOSCOPY Left 08/21/2015   Procedure: ESOPHAGOGASTRODUODENOSCOPY (EGD);  Surgeon: Charlott Rakes, MD;  Location: [facility];  Service: Endoscopy;  Laterality: Left;       Family History  Problem Relation Age of Onset  . Peptic Ulcer Paternal Aunt   . Crohn's disease Maternal Uncle   . Heart attack Maternal Uncle   . Heart attack Maternal Grandfather   . Stroke Maternal Grandmother   . Stroke Paternal Grandfather     Social History   Tobacco Use  . Smoking status: Former Smoker    Packs/day: 1.00    Types: Cigarettes  . Smokeless tobacco: Never Used  Substance Use Topics  . Alcohol use: Not Currently  . Drug use: Yes    Types: Marijuana    Home Medications Prior to Admission medications   Medication Sig Start Date End Date Taking? Authorizing Provider  folic acid (FOLVITE) 1 MG tablet Take 1 tablet (1 mg total) by mouth daily. 07/19/19  Yes Johnson, Clanford L,  MD  furosemide (LASIX) 20 MG tablet Take 20 mg by mouth daily. 04/27/19  Yes [provider]  IRON PO Take 1 tablet by mouth daily.   Yes [provider]  lactulose (CHRONULAC) 10 GM/15ML solution Take 45 mLs (30 g total) by mouth 3 (three) times daily. 09/08/15  Yes Donne Hazel, MD  magnesium oxide (MAG-OX) 400 (241.3 Mg) MG tablet Take 1 tablet (400 mg total) by mouth 2 (two) times daily. 11/15/18  Yes Pokhrel, Laxman, MD  Multiple Vitamin (MULTIVITAMIN WITH MINERALS) TABS tablet Take 1 tablet  by mouth daily. 07/19/19  Yes Johnson, Clanford L, MD  nadolol (CORGARD) 20 MG tablet Take 20 mg by mouth daily. 05/23/18  Yes [provider]  omeprazole (PRILOSEC) 20 MG capsule Take 20 mg by mouth daily.   Yes [provider]  potassium chloride SA (K-DUR,KLOR-CON) 20 MEQ tablet Take 20 mEq by mouth 2 (two) times daily. 05/26/18  Yes [provider]  sertraline (ZOLOFT) 100 MG tablet Take 100 mg by mouth daily. 07/19/19  Yes [provider]  thiamine 100 MG tablet Take 1 tablet (100 mg total) by mouth daily. 07/19/19  Yes Johnson, Clanford L, MD    Allergies    Penicillins  Review of Systems   Review of Systems  Unable to perform ROS: Acuity of condition  Constitutional: Negative.   HENT: Negative.   Eyes: Negative.   Respiratory: Negative.   Cardiovascular: Negative.   Gastrointestinal: Positive for nausea and vomiting. Negative for abdominal distention, abdominal pain, anal bleeding, blood in stool, constipation, diarrhea and rectal pain.  Genitourinary: Negative.   Musculoskeletal: Negative.   Skin: Negative.   Neurological: Positive for weakness (Generalized) and light-headedness. Negative for dizziness, tremors, syncope, numbness and headaches.    Physical Exam Updated Vital Signs BP 121/88   Pulse (!) 105   Temp 98.8 F (37.1 C) (Oral)   Resp 19   Ht 5\' 7"  (1.702 m)   Wt 61.2 kg   SpO2 100%   BMI 21.14 kg/m   Physical Exam Vitals and nursing note reviewed.  Constitutional:      General: He is in acute distress.     Appearance: He is well-developed. He is ill-appearing. He is not toxic-appearing or diaphoretic.  HENT:     Head: Normocephalic and atraumatic.     Nose: Nose normal.     Mouth/Throat:     Mouth: Mucous membranes are moist.     Pharynx: Oropharynx is clear.     Comments: Dried blood in oropharynx and on lips Eyes:     Extraocular Movements: Extraocular movements intact.     Pupils: Pupils are equal, round, and  reactive to light.     Comments: Conjunctival pallor  Cardiovascular:     Rate and Rhythm: Regular rhythm. Tachycardia present.  Pulmonary:     Effort: Pulmonary effort is normal. No tachypnea, bradypnea, accessory muscle usage or respiratory distress.     Breath sounds: Normal breath sounds and air entry.  Abdominal:     General: Bowel sounds are normal. There is no distension.     Palpations: Abdomen is soft.     Tenderness: There is no abdominal tenderness. There is no right CVA tenderness, left CVA tenderness or guarding.     Comments: Soft, nontender.  No peritoneal signs no rebound or guarding  Musculoskeletal:        General: Normal range of motion.     Cervical back: Normal range of motion and  neck supple.     Comments: Moves all 4 extremities without difficulty.  Compartments soft.  No bony tenderness.  Skin:    General: Skin is warm and dry.     Capillary Refill: Capillary refill takes 2 to 3 seconds.     Comments: Pallor.  No rashes or lesions  Neurological:     Mental Status: He is alert.     Comments: Cranial nerves II through XII grossly intact. Negative finger-nose, heel-to-shin, Romberg 4.5/5 strength bilateral upper and lower extremities without difficulty.    ED Results / Procedures / Treatments   Labs (all labs ordered are listed, but only abnormal results are displayed) Labs Reviewed  COMPREHENSIVE METABOLIC PANEL - Abnormal; Notable for the following components:      Result Value   Potassium 5.9 (*)    Glucose, Bld 134 (*)    Calcium 8.5 (*)    Albumin 2.9 (*)    AST 288 (*)    ALT 96 (*)    Total Bilirubin 4.7 (*)    Anion gap 17 (*)    All other components within normal limits  CBC - Abnormal; Notable for the following components:   WBC 18.9 (*)    RBC 3.37 (*)    Hemoglobin 11.3 (*)    HCT 33.7 (*)    All other components within normal limits  LACTIC ACID, PLASMA - Abnormal; Notable for the following components:   Lactic Acid, Venous 8.0 (*)     All other components within normal limits  OCCULT BLOOD GASTRIC / DUODENUM (SPECIMEN CUP) - Abnormal; Notable for the following components:   Occult Blood, Gastric POSITIVE (*)    All other components within normal limits  PROTIME-INR - Abnormal; Notable for the following components:   Prothrombin Time 21.4 (*)    INR 1.9 (*)    All other components within normal limits  AMMONIA - Abnormal; Notable for the following components:   Ammonia 47 (*)    All other components within normal limits  I-STAT CHEM 8, ED - Abnormal; Notable for the following components:   Potassium 5.8 (*)    Glucose, Bld 121 (*)    Calcium, Ion 0.95 (*)    Hemoglobin 10.5 (*)    HCT 31.0 (*)    All other components within normal limits  RESPIRATORY PANEL BY RT PCR (FLU A&B, COVID)  CULTURE, BLOOD (ROUTINE X 2)  CULTURE, BLOOD (ROUTINE X 2)  APTT  LACTIC ACID, PLASMA  TYPE AND SCREEN  PREPARE RBC (CROSSMATCH)  PREPARE RBC (CROSSMATCH)  ABO/RH  PREPARE FRESH FROZEN PLASMA    EKG None  Radiology DG Chest Portable 1 View  Result Date: 08/30/2019 CLINICAL DATA:  Shortness of breath. EXAM: PORTABLE CHEST 1 VIEW COMPARISON:  05/26/2018. FINDINGS: Mediastinum and hilar structures normal. Lungs are clear. No pleural effusion or pneumothorax. Heart size normal. Thoracic spine scoliosis concave left. No acute bony abnormality identified. IMPRESSION: No acute cardiopulmonary disease. Electronically Signed   By: Maisie Fushomas  Register   On: 08/30/2019 15:12    Procedures .Critical Care Performed by: Linwood DibblesHenderly, Britni A, PA-C Authorized by: Linwood DibblesHenderly, Britni A, PA-C   Critical care provider statement:    Critical care time (minutes):  45   Critical care was necessary to treat or prevent imminent or life-threatening deterioration of the following conditions:  Hepatic failure and circulatory failure   Critical care was time spent personally by me on the following activities:  Discussions with consultants, evaluation of  patient's response to  treatment, examination of patient, ordering and performing treatments and interventions, ordering and review of laboratory studies, ordering and review of radiographic studies, pulse oximetry, re-evaluation of patient's condition, obtaining history from patient or surrogate and review of old charts   (including critical care time)  Medications Ordered in ED Medications  sodium chloride 0.9 % bolus 1,000 mL (0 mLs Intravenous Stopped 08/30/19 1632)    And  0.9 %  sodium chloride infusion ( Intravenous New Bag/Given 08/30/19 1630)  0.9 %  sodium chloride infusion (10 mL/hr Intravenous Not Given 08/30/19 1519)  LORazepam (ATIVAN) injection 0-4 mg (0 mg Intravenous Not Given 08/30/19 1521)    Or  LORazepam (ATIVAN) tablet 0-4 mg ( Oral See Alternative 08/30/19 1521)  LORazepam (ATIVAN) injection 0-4 mg (has no administration in time range)    Or  LORazepam (ATIVAN) tablet 0-4 mg (has no administration in time range)  thiamine tablet 100 mg ( Oral See Alternative 08/30/19 1504)    Or  thiamine (B-1) injection 100 mg (100 mg Intravenous Given 08/30/19 1504)  octreotide (SANDOSTATIN) 500 mcg in sodium chloride 0.9 % 250 mL (2 mcg/mL) infusion (50 mcg/hr Intravenous New Bag/Given 08/30/19 1525)  0.9 %  sodium chloride infusion (10 mL/hr Intravenous Not Given 08/30/19 1519)  calcium gluconate 2 g/ 100 mL sodium chloride IVPB (has no administration in time range)  0.9 %  sodium chloride infusion (Manually program via Guardrails IV Fluids) (has no administration in time range)  cefTRIAXone (ROCEPHIN) 1 g in sodium chloride 0.9 % 100 mL IVPB (has no administration in time range)  sodium chloride 0.9 % bolus 1,000 mL (0 mLs Intravenous Stopped 08/30/19 1603)  pantoprazole (PROTONIX) injection 40 mg (40 mg Intravenous Given 08/30/19 1504)  octreotide (SANDOSTATIN) 2 mcg/mL load via infusion 100 mcg (100 mcg Intravenous Bolus from Bag 08/30/19 1525)  cefTRIAXone (ROCEPHIN) 1 g in sodium chloride 0.9 % 100  mL IVPB (0 g Intravenous Stopped 08/30/19 1703)  metoCLOPramide (REGLAN) injection 10 mg (10 mg Intravenous Given 08/30/19 1602)   ED Course  I have reviewed the triage vital signs and the nursing notes.  Pertinent labs & imaging results that were available during my care of the patient were reviewed by me and considered in my medical decision making (see chart for details).  39 year old male presents for evaluation of hematemesis.  On arrival patient tachycardic, hypotensive.  Afebrile, does not appear septic however does appear ill.  Dried blood to lips.  Looks clinically ill.  History of esophageal varices with bleed.  Chronic alcohol use with history of DTs and withdrawal seizures.  Last drink 2 days ago.  Verbal orders placed for labs as well as IV fluids, 2 units emergent blood as well as to IV access.  Started on octreotide, Rocephin.   Labs and imaging personally reviewed and interpreted: CBC with leukocytosis at 18.9, hemoglobin 11.3, 3-point drop from 1 month ago. Metabolic panel hyperkalemia at 5.9 no EKG changes, will do IV fluids as well as albuterol neb for hyperkalemia.  Elevated LFTs however at baseline from previous visits Covid negative Lactic acid 8.0, low suspicion for sepsis. DG chest without acute cardiopulmonary  Patient reassessed.  No vomiting currently.  Protecting his airway.  Mentating appropriately.  Discussed plan with consult for GI and admission patient and family in room are agreeable.  Does have lactic acidosis as well as leukocytosis however I low suspicion for sepsis.  His abdomen is soft I have low suspicion for SBP.  Patient reassessed.  Continues  to be hemodynamically stable and protecting his airway.  GI has evaluated patient.  Consult with Dr. Marca Ancona with [facility].  She will take patient to endoscopy suite.  Contact with critical care.  They will plan to admit patient.  On reassessment after 2 units of blood, liter of fluid patient with significant  improvement in blood pressure.  Mentating appropriately. CIWA placed 2/2 hx of DT, WD seizures.  The patient appears reasonably stabilized for admission considering the current resources, flow, and capabilities available in the ED at this time, and I doubt any other [facility] requiring further screening and/or treatment in the ED prior to admission.   MDM Rules/Calculators/A&P                       Final Clinical Impression(s) / ED Diagnoses Final diagnoses:  Alcohol abuse  Hypotension, unspecified hypotension type  Bleeding esophageal varices, unspecified esophageal varices type (HCC)  Elevated lactic acid level  Hyperkalemia    Rx / DC Orders ED Discharge Orders    None       Henderly, Britni A, PA-C 08/30/19 1704    Sabas Sous, MD 09/01/19 1122

## 2019-08-30 NOTE — Anesthesia Procedure Notes (Signed)
Procedure Name: Intubation Date/Time: 08/30/2019 6:05 PM Performed by: Shirlyn Goltz, CRNA Pre-anesthesia Checklist: Patient identified, Emergency Drugs available, Suction available and Patient being monitored Patient Re-evaluated:Patient Re-evaluated prior to induction Oxygen Delivery Method: Circle system utilized Preoxygenation: Pre-oxygenation with 100% oxygen Induction Type: IV induction, Rapid sequence and Cricoid Pressure applied Laryngoscope Size: Mac and 4 Grade View: Grade I Tube type: Oral Tube size: 7.0 mm Number of attempts: 1 Airway Equipment and Method: Stylet Placement Confirmation: ETT inserted through vocal cords under direct vision,  positive ETCO2 and breath sounds checked- equal and bilateral Secured at: 22 cm Tube secured with: Tape Dental Injury: Teeth and Oropharynx as per pre-operative assessment

## 2019-08-31 DIAGNOSIS — F1010 Alcohol abuse, uncomplicated: Secondary | ICD-10-CM

## 2019-08-31 DIAGNOSIS — D62 Acute posthemorrhagic anemia: Secondary | ICD-10-CM

## 2019-08-31 DIAGNOSIS — K7290 Hepatic failure, unspecified without coma: Secondary | ICD-10-CM

## 2019-08-31 DIAGNOSIS — I8511 Secondary esophageal varices with bleeding: Principal | ICD-10-CM

## 2019-08-31 DIAGNOSIS — K7030 Alcoholic cirrhosis of liver without ascites: Secondary | ICD-10-CM

## 2019-08-31 DIAGNOSIS — F10230 Alcohol dependence with withdrawal, uncomplicated: Secondary | ICD-10-CM

## 2019-08-31 LAB — COMPREHENSIVE METABOLIC PANEL
ALT: 352 U/L — ABNORMAL HIGH (ref 0–44)
AST: 1185 U/L — ABNORMAL HIGH (ref 15–41)
Albumin: 2.8 g/dL — ABNORMAL LOW (ref 3.5–5.0)
Alkaline Phosphatase: 48 U/L (ref 38–126)
Anion gap: 10 (ref 5–15)
BUN: 14 mg/dL (ref 6–20)
CO2: 26 mmol/L (ref 22–32)
Calcium: 7.8 mg/dL — ABNORMAL LOW (ref 8.9–10.3)
Chloride: 106 mmol/L (ref 98–111)
Creatinine, Ser: 1.01 mg/dL (ref 0.61–1.24)
GFR calc Af Amer: >60 mL/min (ref >60)
GFR calc non Af Amer: >60 mL/min (ref >60)
Glucose, Bld: 111 mg/dL — ABNORMAL HIGH (ref 70–99)
Potassium: 3.5 mmol/L (ref 3.5–5.1)
Sodium: 142 mmol/L (ref 135–145)
Total Bilirubin: 4.3 mg/dL — ABNORMAL HIGH (ref 0.3–1.2)
Total Protein: 5.4 g/dL — ABNORMAL LOW (ref 6.5–8.1)

## 2019-08-31 LAB — BLOOD PRODUCT ORDER (VERBAL) VERIFICATION

## 2019-08-31 LAB — CBC
HCT: 29.5 % — ABNORMAL LOW (ref 39.0–52.0)
Hemoglobin: 10.2 g/dL — ABNORMAL LOW (ref 13.0–17.0)
MCH: 32.7 pg (ref 26.0–34.0)
MCHC: 34.6 g/dL (ref 30.0–36.0)
MCV: 94.6 fL (ref 80.0–100.0)
Platelets: 90 10*3/uL — ABNORMAL LOW (ref 150–400)
RBC: 3.12 MIL/uL — ABNORMAL LOW (ref 4.22–5.81)
RDW: 16.4 % — ABNORMAL HIGH (ref 11.5–15.5)
WBC: 18.6 10*3/uL — ABNORMAL HIGH (ref 4.0–10.5)
nRBC: 0 % (ref 0.0–0.2)

## 2019-08-31 LAB — HEMOGLOBIN AND HEMATOCRIT, BLOOD
HCT: 28.7 % — ABNORMAL LOW (ref 39.0–52.0)
HCT: 29 % — ABNORMAL LOW (ref 39.0–52.0)
HCT: 30.5 % — ABNORMAL LOW (ref 39.0–52.0)
Hemoglobin: 10 g/dL — ABNORMAL LOW (ref 13.0–17.0)
Hemoglobin: 10.4 g/dL — ABNORMAL LOW (ref 13.0–17.0)
Hemoglobin: 9.8 g/dL — ABNORMAL LOW (ref 13.0–17.0)

## 2019-08-31 LAB — MAGNESIUM: Magnesium: 1.1 mg/dL — ABNORMAL LOW (ref 1.7–2.4)

## 2019-08-31 LAB — LACTIC ACID, PLASMA: Lactic Acid, Venous: 3.4 mmol/L (ref 0.5–1.9)

## 2019-08-31 LAB — GLUCOSE, CAPILLARY: Glucose-Capillary: 108 mg/dL — ABNORMAL HIGH (ref 70–99)

## 2019-08-31 MED ORDER — CHLORHEXIDINE GLUCONATE CLOTH 2 % EX PADS: 6.0000 | MEDICATED_PAD | Freq: Every day | CUTANEOUS | Status: DC

## 2019-08-31 MED ORDER — MAGNESIUM SULFATE 2 GM/50ML IV SOLN
2.0000 g | Freq: Once | INTRAVENOUS | Status: AC
  Administered 2019-08-31: 2 g via INTRAVENOUS
  Filled 2019-08-31: qty 50

## 2019-08-31 MED ORDER — SODIUM CHLORIDE 0.9 % IV SOLN: INTRAVENOUS | Status: DC | PRN

## 2019-08-31 MED ORDER — FOLIC ACID 1 MG PO TABS
1.0000 mg | ORAL_TABLET | Freq: Every day | ORAL | Status: DC
  Administered 2019-08-31 – 2019-09-02 (×3): 1 mg via ORAL
  Filled 2019-08-31 (×3): qty 1

## 2019-08-31 MED ORDER — MAGNESIUM SULFATE 4 GM/100ML IV SOLN
4.0000 g | Freq: Once | INTRAVENOUS | Status: AC
  Administered 2019-08-31: 4 g via INTRAVENOUS
  Filled 2019-08-31: qty 100

## 2019-08-31 MED ORDER — PANTOPRAZOLE SODIUM 40 MG PO TBEC
40.0000 mg | DELAYED_RELEASE_TABLET | Freq: Two times a day (BID) | ORAL | Status: DC
  Administered 2019-08-31 – 2019-09-01 (×3): 40 mg via ORAL
  Filled 2019-08-31 (×3): qty 1

## 2019-08-31 NOTE — Progress Notes (Signed)
NAME:  [name], MRN:  [id number], DOB:  [birth year], LOS: 1 ADMISSION DATE:  08/30/2019, CONSULTATION DATE:  08/30/19 REFERRING MD:  Ed physician, CHIEF COMPLAINT:  hypotension   Brief History   39 yo male with h/o etoh related cirrhosis (last drink 2 days ago) presents with 6 hours of hematemesis.   History of present illness   39 yo with pmh etoh related cirrhosis, esophageal varices, gerd presented to ed with his mother after hematemesis for past 6 hours. Pt's mother endorses large volume bleeding with bright red blood and clots. Pt states last drink was 2 days ago when he drank a pint of vodka. He reports compliance with his medications. He denies hematochezia. + dizziness and lethargy. Feels a bit "shakey" has not had DT per pt and mother defers to him. 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 Pt has intermittently stopped drinking since 2017 and states that all he would say is "I had a bad hangover for a couple days, but no withdrawal".   Upon presentation he was noted to be tachycardic and hypotensive. Improved with ivf and blood transfusion. He was started on octreotide gtt and GI has been consulted.   Past Medical History   Past Medical History:  Diagnosis Date  . Cirrhosis of liver (HCC)   . Esophageal varices with hemorrhage (HCC)   . ETOH abuse   . GERD (gastroesophageal reflux disease)     Significant Hospital Events   5/4: pending GI upper endoscopy  Consults:  5/4 GI  Procedures:  5/4 Upper endoscopy: With banding  Significant Diagnostic Tests:    Micro Data:  5/4 sars2: neg 5/4 flu: neg 5/4 blood:   Antimicrobials:  Ceftriaxone 5/4->  Interim history/subjective:  08/31/2019 status post banding no further episodes of bleeding awake and alert Objective   Blood pressure 123/83, pulse 94, temperature 98.7 F (37.1 C), temperature source Oral, resp. rate 18, height 5\' 7"  (1.702 m), weight 61 kg, SpO2 98 %.       Intake/Output Summary (Last 24 hours) at 08/31/2019 0954 Last data filed at 08/31/2019 0600 Gross per 24 hour  Intake 5286.78 ml  Output 200 ml  Net 5086.78 ml   Filed Weights   08/30/19 1420 08/31/19 0500  Weight: 61.2 kg 61 kg    Examination: General: Awake alert no acute distress  HEENT: MM pink/moist no JVD no lymphadenopathy is appreciated Neuro: Grossly intact CV: Heart sounds are regular PULM: Clear to auscultation Abdomen soft, bsx4 active  GU: Voids Extremities: warm/dry,  edema  Skin: no rashes or lesions   Resolved Hospital Problem list     Assessment & Plan:  Shock, hemorrhagic:  Recent Labs    08/30/19 2349 08/31/19 0536  HGB 10.4* 10.2*   Currently resolved Monitor for recurrence of bleeding Transfuse per protocol  Upper GI bleed:  Status post banding 08/30/2019 Octreotide drip Per GI services Can transfer out of the intensive care unit  etoh cirrhosis:  Continue to follow GI services following  Gerd:  PPI  H/o hepatic encephalopathy:  Currently awake and alert Will need to resume home medications in near future Coagulopathy:  Lab Results  Component Value Date   INR 1.9 (H) 08/30/2019   INR 1.5 (H) 07/16/2019   INR 1.56 (H) 09/03/2015   Continue to monitor, noted improvement  etoh abuse:  Continue withdrawal protocols Continue folic acid thiamine Outpatient counseling  Hyperkalemia:  Recent Labs  Lab 08/30/19 1501 08/30/19 2114 08/31/19 0536  K 5.8* 4.7 3.5    Resolved  Lactic acidosis: Resolved   Best practice:  Diet: Advance per GI services Pain/Anxiety/Delirium protocol (if indicated): ciwa VAP protocol (if indicated): n/a DVT prophylaxis: scd GI prophylaxis: ppi octreotide drip for 72 hrs Glucose control: monitor Mobility: bedrest Code Status: full Family Communication: 08/31/2019 patient updated at bedside Disposition: ICU 08/31/2019 plan to transfer out to stepdown unit and to [facility] service.  Labs    CBC: Recent Labs  Lab 08/30/19 1432 08/30/19 1501 08/30/19 2114 08/30/19 2349 08/31/19 0536  WBC 18.9*  --   --   --  18.6*  HGB 11.3* 10.5* 11.1* 10.4* 10.2*  HCT 33.7* 31.0* 32.9* 30.5* 29.5*  MCV 100.0  --   --   --  94.6  PLT 267  --   --   --  90*    Basic Metabolic Panel: Recent Labs  Lab 08/30/19 1432 08/30/19 1501 08/30/19 2114 08/31/19 0536  NA 140 140  --  142  K 5.9* 5.8* 4.7 3.5  CL 101 102  --  106  CO2 22  --   --  26  GLUCOSE 134* 121*  --  111*  BUN 17 17  --  14  CREATININE 1.16 1.10  --  1.01  CALCIUM 8.5*  --   --  7.8*  MG  --   --   --  1.1*   GFR: Estimated Creatinine Clearance: 84.7 mL/min (by C-G formula based on SCr of 1.01 mg/dL). Recent Labs  Lab 08/30/19 1432 08/30/19 1450 08/30/19 1642 08/30/19 2114 08/30/19 2349 08/31/19 0536  WBC 18.9*  --   --   --   --  18.6*  LATICACIDVEN  --  8.0* 6.0* 3.9* 3.4*  --     Liver Function Tests: Recent Labs  Lab 08/30/19 1432 08/31/19 0536  AST 288* 1,185*  ALT 96* 352*  ALKPHOS 66 48  BILITOT 4.7* 4.3*  PROT 6.6 5.4*  ALBUMIN 2.9* 2.8*   No results for input(s): LIPASE, AMYLASE in the last 168 hours. Recent Labs  Lab 08/30/19 1450  AMMONIA 47*    ABG    Component Value Date/Time   PHART 7.235 (L) 08/22/2015 0734   PCO2ART 32.6 (L) 08/22/2015 0734   PO2ART 133 (H) 08/22/2015 0734   HCO3 13.3 (L) 08/22/2015 0734   TCO2 22 08/30/2019 1501   ACIDBASEDEF 12.7 (H) 08/22/2015 0734   O2SAT 98.6 08/22/2015 0734     Coagulation Profile: Recent Labs  Lab 08/30/19 1445  INR 1.9*    Cardiac Enzymes: No results for input(s): CKTOTAL, CKMB, CKMBINDEX, TROPONINI in the last 168 hours.  HbA1C: No results found for: HGBA1C  CBG: Recent Labs  Lab 08/30/19 2013 08/31/19 0715  GLUCAP [place] Dquan Cortopassi ACNP Acute Care Nurse Practitioner Maryanna Shape Pulmonary/Critical Care Please consult [place] 08/31/2019, 9:54 AM

## 2019-08-31 NOTE — Progress Notes (Signed)
Subjective: Patient did well post EGD with banding.  He had mild chest discomfort which lasted for a few hours, completely resolved currently.  He reports having a large brown bowel movement.  No further episodes of hematemesis.  He denies abdominal pain.  He is requesting for his diet to be advanced.  Objective: Vital signs in last 24 hours: Temp:  [98 F (36.7 C)-99.4 F (37.4 C)] 98.7 F (37.1 C) (05/05 0717) Pulse Rate:  [89-143] 94 (05/05 0530) Resp:  [11-31] 18 (05/05 0530) BP: (97-148)/(58-106) 123/83 (05/05 0530) SpO2:  [95 %-100 %] 98 % (05/05 0530) Weight:  [61 kg-61.2 kg] 61 kg (05/05 0500) Weight change:     PE: Lying comfortably on bed, mild pallor, mild icterus GENERAL: Alert, oriented x3, no asterixis ABDOMEN: Soft, nondistended, nontender, normoactive bowel sounds EXTREMITIES: No pedal edema, no deformity  Lab Results: Results for orders placed or performed during the hospital encounter of 08/30/19 (from the past 48 hour(s))  Type and screen [facility]     Status: None (Preliminary result)   Collection Time: 08/30/19  2:30 PM  Result Value Ref Range   ABO/RH(D) O NEG    Antibody Screen NEG    Sample Expiration 09/02/2019,2359    Unit Number [id number]    Blood Component Type RED CELLS,LR    Unit division 00    Status of Unit ISSUED    Transfusion Status OK TO TRANSFUSE    Crossmatch Result Compatible    Unit Number [id number]    Blood Component Type RBC LR PHER1    Unit division 00    Status of Unit ISSUED    Transfusion Status OK TO TRANSFUSE    Crossmatch Result COMPATIBLE    Unit Number [id number]    Blood Component Type RBC LR PHER2    Unit division 00    Status of Unit ALLOCATED    Transfusion Status OK TO TRANSFUSE    Crossmatch Result      Compatible Performed at [facility] Lab, [address] [place], [place] [address]   ABO/Rh     Status: None   Collection Time: 08/30/19  2:30 PM  Result Value Ref Range    ABO/RH(D)      O NEG Performed at [facility], [address] [place], [place] [address]   Comprehensive metabolic panel     Status: Abnormal   Collection Time: 08/30/19  2:32 PM  Result Value Ref Range   Sodium 140 135 - 145 mmol/L   Potassium 5.9 (H) 3.5 - 5.1 mmol/L   Chloride 101 98 - 111 mmol/L   CO2 22 22 - 32 mmol/L   Glucose, Bld 134 (H) 70 - 99 mg/dL    Comment: Glucose reference range applies only to samples taken after fasting for at least 8 hours.   BUN 17 6 - 20 mg/dL   Creatinine, Ser 5.32 0.61 - 1.24 mg/dL   Calcium 8.5 (L) 8.9 - 10.3 mg/dL   Total Protein 6.6 6.5 - 8.1 g/dL   Albumin 2.9 (L) 3.5 - 5.0 g/dL   AST 992 (H) 15 - 41 U/L   ALT 96 (H) 0 - 44 U/L   Alkaline Phosphatase 66 38 - 126 U/L   Total Bilirubin 4.7 (H) 0.3 - 1.2 mg/dL   GFR calc non Af Amer >60 >60 mL/min   GFR calc Af Amer >60 >60 mL/min   Anion gap 17 (H) 5 - 15    Comment:  Performed at [facility] Lab, [address] [place], [place] [address]  CBC     Status: Abnormal   Collection Time: 08/30/19  2:32 PM  Result Value Ref Range   WBC 18.9 (H) 4.0 - 10.5 K/uL   RBC 3.37 (L) 4.22 - 5.81 MIL/uL   Hemoglobin 11.3 (L) 13.0 - 17.0 g/dL   HCT 08.6 (L) 76.1 - 95.0 %   MCV 100.0 80.0 - 100.0 fL   MCH 33.5 26.0 - 34.0 pg   MCHC 33.5 30.0 - 36.0 g/dL   RDW 93.2 67.1 - 24.5 %   Platelets 267 150 - 400 K/uL   nRBC 0.0 0.0 - 0.2 %    Comment: Performed at [facility] Lab, [address], [place], [place] [address]  Protime-INR     Status: Abnormal   Collection Time: 08/30/19  2:45 PM  Result Value Ref Range   Prothrombin Time 21.4 (H) 11.4 - 15.2 seconds   INR 1.9 (H) 0.8 - 1.2    Comment: (NOTE) INR goal varies based on device and disease states. Performed at [facility] Lab, [address], [place], [place] [address]   APTT     Status: None   Collection Time: 08/30/19  2:45 PM  Result Value Ref Range   aPTT 31 24 - 36 seconds    Comment: Performed at [facility]  Lab, [address], [place], [place] [address]  Prepare RBC (crossmatch)     Status: None   Collection Time: 08/30/19  2:48 PM  Result Value Ref Range   Order Confirmation      ORDER PROCESSED BY BLOOD BANK Performed at [facility] Lab, [address], [place], [place] [address]   Lactic acid, plasma     Status: Abnormal   Collection Time: 08/30/19  2:50 PM  Result Value Ref Range   Lactic Acid, Venous 8.0 (HH) 0.5 - 1.9 mmol/L    Comment: CRITICAL RESULT CALLED TO, READ BACK BY AND VERIFIED WITH: MAGGIE BARBER,RN AT 1550 08/30/2019 BY ZBEECH. Performed at [facility] Lab, [address], [place], [place] [address]   Blood culture (routine x 2)     Status: None (Preliminary result)   Collection Time: 08/30/19  2:50 PM   Specimen: BLOOD  Result Value Ref Range   Specimen Description BLOOD RIGHT ANTECUBITAL    Special Requests      BOTTLES DRAWN AEROBIC AND ANAEROBIC Blood Culture adequate volume   Culture      NO GROWTH < 24 HOURS Performed at [facility] Lab, [address] [place], [place] [address]    Report Status PENDING   Ammonia     Status: Abnormal   Collection Time: 08/30/19  2:50 PM  Result Value Ref Range   Ammonia 47 (H) 9 - 35 umol/L    Comment: Performed at [facility], [address] [place], [place] [address]  Blood culture (routine x 2)     Status: None (Preliminary result)   Collection Time: 08/30/19  3:00 PM   Specimen: BLOOD LEFT ARM  Result Value Ref Range   Specimen Description BLOOD LEFT ARM    Special Requests      BOTTLES DRAWN AEROBIC AND ANAEROBIC Blood Culture adequate volume   Culture      NO GROWTH < 12 HOURS Performed at [facility] Lab, [address], [place], [place] [address]    Report Status PENDING   Respiratory  Panel by RT PCR (Flu A&B, Covid) - Nasopharyngeal Swab     Status: None   Collection Time: 08/30/19  3:00 PM   Specimen: Nasopharyngeal Swab  Result Value Ref Range   SARS Coronavirus 2 by RT PCR NEGATIVE  NEGATIVE    Comment: (NOTE) SARS-CoV-2 target nucleic acids are NOT DETECTED. The SARS-CoV-2 RNA is generally detectable in upper respiratoy specimens during the acute phase of infection. The lowest concentration of SARS-CoV-2 viral copies this assay can detect is 131 copies/mL. A negative result does not preclude SARS-Cov-2 infection and should not be used as the sole basis for treatment or other patient management decisions. A negative result may occur with  improper specimen collection/handling, submission of specimen other than nasopharyngeal swab, presence of viral mutation(s) within the areas targeted by this assay, and inadequate number of viral copies (<131 copies/mL). A negative result must be combined with clinical observations, patient history, and epidemiological information. The expected result is Negative. Fact Sheet for Patients:  https://www.moore.com/https://www.fda.gov/media/142436/download Fact Sheet for Healthcare Providers:  https://www.young.biz/https://www.fda.gov/media/142435/download This test is not yet ap proved or cleared by the [place] FDA and  has been authorized for detection and/or diagnosis of SARS-CoV-2 by FDA under an Emergency Use Authorization (EUA). This EUA will remain  in effect (meaning this test can be used) for the duration of the COVID-19 declaration under Section 564(b)(1) of the Act, 21 U.S.C. section 360bbb-3(b)(1), unless the authorization is terminated or revoked sooner.    Influenza A by PCR NEGATIVE NEGATIVE   Influenza B by PCR NEGATIVE NEGATIVE    Comment: (NOTE) The Xpert Xpress SARS-CoV-2/FLU/RSV assay is intended as an aid in  the diagnosis of influenza from Nasopharyngeal swab specimens and  should not be used as a sole basis for treatment. Nasal washings and  aspirates are unacceptable for Xpert Xpress SARS-CoV-2/FLU/RSV  testing. Fact Sheet for Patients: https://www.moore.com/https://www.fda.gov/media/142436/download Fact Sheet for Healthcare  Providers: https://www.young.biz/https://www.fda.gov/media/142435/download This test is not yet approved or cleared by the [place] FDA and  has been authorized for detection and/or diagnosis of SARS-CoV-2 by  FDA under an Emergency Use Authorization (EUA). This EUA will remain  in effect (meaning this test can be used) for the duration of the  Covid-19 declaration under Section 564(b)(1) of the Act, 21  U.S.C. section 360bbb-3(b)(1), unless the authorization is  terminated or revoked. Performed at [facility], [address] [place], [place] [address]   I-Stat Chem 8, ED     Status: Abnormal   Collection Time: 08/30/19  3:01 PM  Result Value Ref Range   Sodium 140 135 - 145 mmol/L   Potassium 5.8 (H) 3.5 - 5.1 mmol/L   Chloride 102 98 - 111 mmol/L   BUN 17 6 - 20 mg/dL   Creatinine, Ser 6.041.10 0.61 - 1.24 mg/dL   Glucose, Bld 540121 (H) 70 - 99 mg/dL    Comment: Glucose reference range applies only to samples taken after fasting for at least 8 hours.   Calcium, Ion 0.95 (L) 1.15 - 1.40 mmol/L   TCO2 22 22 - 32 mmol/L   Hemoglobin 10.5 (L) 13.0 - 17.0 g/dL   HCT 98.131.0 (L) 19.139.0 - 47.852.0 %  Prepare RBC (crossmatch)     Status: None   Collection Time: 08/30/19  3:01 PM  Result Value Ref Range   Order Confirmation      ORDER PROCESSED BY BLOOD BANK Performed at [facility], [address] [place], [place] [address]   Occult  bld gastric/duodenum (cup to lab)     Status: Abnormal   Collection Time: 08/30/19  3:30 PM  Result Value Ref Range   pH, Gastric NOT DONE    Occult Blood, Gastric POSITIVE (A) NEGATIVE    Comment: Performed at [facility] Lab, [address], [place], [place] [address]  Lactic acid, plasma     Status: Abnormal   Collection Time: 08/30/19  4:42 PM  Result Value Ref Range   Lactic Acid, Venous 6.0 (HH) 0.5 - 1.9 mmol/L    Comment: CRITICAL VALUE NOTED.  VALUE IS CONSISTENT WITH PREVIOUSLY REPORTED AND CALLED VALUE. Performed at [facility] Lab, [address] [place], [place] [address]   Prepare fresh frozen plasma     Status: None (Preliminary result)   Collection Time: 08/30/19  5:02 PM  Result Value Ref Range   Unit Number [id number]    Blood Component Type THW PLS APHR    Unit division B0    Status of Unit ALLOCATED    Transfusion Status OK TO TRANSFUSE   Glucose, capillary     Status: Abnormal   Collection Time: 08/30/19  8:13 PM  Result Value Ref Range   Glucose-Capillary 168 (H) 70 - 99 mg/dL    Comment: Glucose reference range applies only to samples taken after fasting for at least 8 hours.   Comment 1 Notify RN   MRSA PCR Screening     Status: None   Collection Time: 08/30/19  8:32 PM   Specimen: Nasopharyngeal  Result Value Ref Range   MRSA by PCR NEGATIVE NEGATIVE    Comment:        The GeneXpert MRSA Assay (FDA approved for NASAL specimens only), is one component of a comprehensive MRSA colonization surveillance program. It is not intended to diagnose MRSA infection nor to guide or monitor treatment for MRSA infections. Performed at [facility] Lab, [address], [place], [place] [address]   Lactic acid, plasma     Status: Abnormal   Collection Time: 08/30/19  9:14 PM  Result Value Ref Range   Lactic Acid, Venous 3.9 (HH) 0.5 - 1.9 mmol/L    Comment: CRITICAL VALUE NOTED.  VALUE IS CONSISTENT WITH PREVIOUSLY REPORTED AND CALLED VALUE. Performed at [facility] Lab, [address] [place], [place] [address]   Hemoglobin and hematocrit, blood     Status: Abnormal   Collection Time: 08/30/19  9:14 PM  Result Value Ref Range   Hemoglobin 11.1 (L) 13.0 - 17.0 g/dL   HCT 75.1 (L) 02.5 - 85.2 %    Comment: Performed at [facility] Lab, [address], [place], [place] [address]  Potassium     Status: None   Collection Time: 08/30/19  9:14 PM  Result Value Ref Range   Potassium 4.7 3.5 - 5.1 mmol/L    Comment: Performed at [facility] Lab, [address] [place], [place] [address]  Lactic acid,  plasma     Status: Abnormal   Collection Time: 08/30/19 11:49 PM  Result Value Ref Range   Lactic Acid, Venous 3.4 (HH) 0.5 - 1.9 mmol/L    Comment: CRITICAL VALUE NOTED.  VALUE IS CONSISTENT WITH PREVIOUSLY REPORTED AND CALLED VALUE. Performed at [facility] Lab, [address], [place], [place] [address]   Hemoglobin and hematocrit, blood     Status: Abnormal   Collection Time: 08/30/19 11:49 PM  Result Value Ref Range   Hemoglobin 10.4 (L)  13.0 - 17.0 g/dL   HCT 30.5 (L) 39.0 - 52.0 %    Comment: Performed at [facility] Lab, [place] [address] [place], [place] [address]  CBC     Status: Abnormal   Collection Time: 08/31/19  5:36 AM  Result Value Ref Range   WBC 18.6 (H) 4.0 - 10.5 K/uL   RBC 3.12 (L) 4.22 - 5.81 MIL/uL   Hemoglobin 10.2 (L) 13.0 - 17.0 g/dL   HCT 29.5 (L) 39.0 - 52.0 %   MCV 94.6 80.0 - 100.0 fL   MCH 32.7 26.0 - 34.0 pg   MCHC 34.6 30.0 - 36.0 g/dL   RDW 16.4 (H) 11.5 - 15.5 %   Platelets 90 (L) 150 - 400 K/uL    Comment: Immature Platelet Fraction may be clinically indicated, consider ordering this additional test [id number] PLATELET COUNT CONFIRMED BY SMEAR REPEATED TO VERIFY    nRBC 0.0 0.0 - 0.2 %    Comment: Performed at [facility] Lab, [place] [address], [place], [place] [address]  Magnesium     Status: Abnormal   Collection Time: 08/31/19  5:36 AM  Result Value Ref Range   Magnesium 1.1 (L) 1.7 - 2.4 mg/dL    Comment: Performed at [place] [address] [place], [place] [address]  Comprehensive metabolic panel     Status: Abnormal   Collection Time: 08/31/19  5:36 AM  Result Value Ref Range   Sodium 142 135 - 145 mmol/L   Potassium 3.5 3.5 - 5.1 mmol/L   Chloride 106 98 - 111 mmol/L   CO2 26 22 - 32 mmol/L   Glucose, Bld 111 (H) 70 - 99 mg/dL    Comment: Glucose reference range applies only to samples taken after fasting for at least 8 hours.   BUN 14 6 - 20 mg/dL   Creatinine, Ser 1.01 0.61 - 1.24 mg/dL   Calcium 7.8 (L)  8.9 - 10.3 mg/dL   Total Protein 5.4 (L) 6.5 - 8.1 g/dL   Albumin 2.8 (L) 3.5 - 5.0 g/dL   AST 1,185 (H) 15 - 41 U/L   ALT 352 (H) 0 - 44 U/L   Alkaline Phosphatase 48 38 - 126 U/L   Total Bilirubin 4.3 (H) 0.3 - 1.2 mg/dL   GFR calc non Af Amer >60 >60 mL/min   GFR calc Af Amer >60 >60 mL/min   Anion gap 10 5 - 15    Comment: Performed at [facility] Lab, [place] [address], [place], [place] [address]  Glucose, capillary     Status: Abnormal   Collection Time: 08/31/19  7:15 AM  Result Value Ref Range   Glucose-Capillary 108 (H) 70 - 99 mg/dL    Comment: Glucose reference range applies only to samples taken after fasting for at least 8 hours.    Studies/Results: DG Chest Portable 1 View  Result Date: 08/30/2019 CLINICAL DATA:  Shortness of breath. EXAM: PORTABLE CHEST 1 VIEW COMPARISON:  05/26/2018. FINDINGS: Mediastinum and hilar structures normal. Lungs are clear. No pleural effusion or pneumothorax. Heart size normal. Thoracic spine scoliosis concave left. No acute bony abnormality identified. IMPRESSION: No acute cardiopulmonary disease. Electronically Signed   By: Esperanza   On: 08/30/2019 15:12    Medications: I have reviewed the patient's current medications.  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 Plan to continue octreotide for total of 72 hours, will resume nadolol in the  next 24 hours if heart rate and blood pressures allow Continue IV ceftriaxone Continue thiamine, folic acid and multivitamin and continue alcohol withdrawal. Okay to transfer the patient out of ICU to a regular floor.  Kerin Salen, MD 08/31/2019, 8:00 AM

## 2019-09-01 DIAGNOSIS — I8501 Esophageal varices with bleeding: Secondary | ICD-10-CM

## 2019-09-01 LAB — PREPARE FRESH FROZEN PLASMA

## 2019-09-01 LAB — CBC WITH DIFFERENTIAL/PLATELET
Abs Immature Granulocytes: 0.15 10*3/uL — ABNORMAL HIGH (ref 0.00–0.07)
Basophils Absolute: 0.1 10*3/uL (ref 0.0–0.1)
Basophils Relative: 1 %
Eosinophils Absolute: 0.2 10*3/uL (ref 0.0–0.5)
Eosinophils Relative: 2 %
HCT: 27.5 % — ABNORMAL LOW (ref 39.0–52.0)
Hemoglobin: 9.2 g/dL — ABNORMAL LOW (ref 13.0–17.0)
Immature Granulocytes: 1 %
Lymphocytes Relative: 8 %
Lymphs Abs: 1.1 10*3/uL (ref 0.7–4.0)
MCH: 32.2 pg (ref 26.0–34.0)
MCHC: 33.5 g/dL (ref 30.0–36.0)
MCV: 96.2 fL (ref 80.0–100.0)
Monocytes Absolute: 1.3 10*3/uL — ABNORMAL HIGH (ref 0.1–1.0)
Monocytes Relative: 10 %
Neutro Abs: 10.7 10*3/uL — ABNORMAL HIGH (ref 1.7–7.7)
Neutrophils Relative %: 78 %
Platelets: 75 10*3/uL — ABNORMAL LOW (ref 150–400)
RBC: 2.86 MIL/uL — ABNORMAL LOW (ref 4.22–5.81)
RDW: 16.6 % — ABNORMAL HIGH (ref 11.5–15.5)
WBC: 13.6 10*3/uL — ABNORMAL HIGH (ref 4.0–10.5)
nRBC: 0.3 % — ABNORMAL HIGH (ref 0.0–0.2)

## 2019-09-01 LAB — BPAM FFP
Blood Product Expiration Date: 202105092359
Unit Type and Rh: 6200

## 2019-09-01 LAB — COMPREHENSIVE METABOLIC PANEL
ALT: 319 U/L — ABNORMAL HIGH (ref 0–44)
AST: 837 U/L — ABNORMAL HIGH (ref 15–41)
Albumin: 2.4 g/dL — ABNORMAL LOW (ref 3.5–5.0)
Alkaline Phosphatase: 45 U/L (ref 38–126)
Anion gap: 6 (ref 5–15)
BUN: 5 mg/dL — ABNORMAL LOW (ref 6–20)
CO2: 27 mmol/L (ref 22–32)
Calcium: 7 mg/dL — ABNORMAL LOW (ref 8.9–10.3)
Chloride: 107 mmol/L (ref 98–111)
Creatinine, Ser: 0.77 mg/dL (ref 0.61–1.24)
GFR calc Af Amer: >60 mL/min (ref >60)
GFR calc non Af Amer: >60 mL/min (ref >60)
Glucose, Bld: 114 mg/dL — ABNORMAL HIGH (ref 70–99)
Potassium: 2.8 mmol/L — ABNORMAL LOW (ref 3.5–5.1)
Sodium: 140 mmol/L (ref 135–145)
Total Bilirubin: 1.9 mg/dL — ABNORMAL HIGH (ref 0.3–1.2)
Total Protein: 4.9 g/dL — ABNORMAL LOW (ref 6.5–8.1)

## 2019-09-01 LAB — PHOSPHORUS
Phosphorus: 1.4 mg/dL — ABNORMAL LOW (ref 2.5–4.6)
Phosphorus: 2.5 mg/dL (ref 2.5–4.6)

## 2019-09-01 LAB — PROTIME-INR
INR: 1.9 — ABNORMAL HIGH (ref 0.8–1.2)
Prothrombin Time: 21.1 seconds — ABNORMAL HIGH (ref 11.4–15.2)

## 2019-09-01 LAB — MAGNESIUM: Magnesium: 1.8 mg/dL (ref 1.7–2.4)

## 2019-09-01 LAB — HEMOGLOBIN AND HEMATOCRIT, BLOOD
HCT: 30.8 % — ABNORMAL LOW (ref 39.0–52.0)
Hemoglobin: 10.1 g/dL — ABNORMAL LOW (ref 13.0–17.0)

## 2019-09-01 MED ORDER — NADOLOL 20 MG PO TABS
20.0000 mg | ORAL_TABLET | Freq: Every day | ORAL | Status: DC
  Administered 2019-09-01 – 2019-09-02 (×2): 20 mg via ORAL
  Filled 2019-09-01 (×2): qty 1

## 2019-09-01 MED ORDER — PANTOPRAZOLE SODIUM 40 MG PO TBEC
40.0000 mg | DELAYED_RELEASE_TABLET | Freq: Every day | ORAL | Status: DC
  Administered 2019-09-02: 09:00:00 40 mg via ORAL
  Filled 2019-09-01: qty 1

## 2019-09-01 MED ORDER — SODIUM CHLORIDE 0.9 % IV SOLN: INTRAVENOUS | Status: DC

## 2019-09-01 MED ORDER — MAGNESIUM SULFATE 2 GM/50ML IV SOLN
2.0000 g | Freq: Once | INTRAVENOUS | Status: AC
  Administered 2019-09-01: 2 g via INTRAVENOUS
  Filled 2019-09-01: qty 50

## 2019-09-01 MED ORDER — POTASSIUM CHLORIDE CRYS ER 20 MEQ PO TBCR
20.0000 meq | EXTENDED_RELEASE_TABLET | Freq: Once | ORAL | Status: AC
  Administered 2019-09-01: 20 meq via ORAL
  Filled 2019-09-01: qty 1

## 2019-09-01 MED ORDER — POTASSIUM CHLORIDE 10 MEQ/100ML IV SOLN: 10.0000 meq | INTRAVENOUS | Status: DC

## 2019-09-01 MED ORDER — VITAMIN K1 10 MG/ML IJ SOLN
10.0000 mg | Freq: Once | INTRAVENOUS | Status: DC
  Filled 2019-09-01: qty 1

## 2019-09-01 MED ORDER — POTASSIUM CHLORIDE CRYS ER 20 MEQ PO TBCR: 30.0000 meq | EXTENDED_RELEASE_TABLET | ORAL | Status: DC

## 2019-09-01 MED ORDER — POTASSIUM PHOSPHATES 15 MMOLE/5ML IV SOLN
45.0000 mmol | Freq: Once | INTRAVENOUS | Status: AC
  Administered 2019-09-01: 11:00:00 45 mmol via INTRAVENOUS
  Filled 2019-09-01: qty 15

## 2019-09-01 NOTE — Progress Notes (Signed)
OT Cancellation Note  Patient Details Name: [name]: [id number] DOB: November 02, 1980   Cancelled Treatment:    Reason Eval/Treat Not Completed: OT screened, no needs identified, will sign off(pt is independent and at baseline with BADL. OT Will screen and sign off, please re consult if changes arise. Thank you)  Dalphine Handing, MSOT, OTR/L Acute Rehabilitation Services [facility] Office Number: [phone] Pager: [phone]  Dalphine Handing 09/01/2019, 5:56 PM

## 2019-09-01 NOTE — Evaluation (Signed)
Physical Therapy Evaluation Patient Details Name: [name]: [id number] DOB: 11/02/1980 Today's Date: 09/01/2019   History of Present Illness  Pt is 39 yo with past medical history significant for EtOH related cirrhosis, esophageal varices, GERD who present with hematemesis, hypotension, and tachycardia.  Pt s/p EGD on 5/4 and banding of esophageal varices.  Clinical Impression  Pt admitted with above diagnosis.  He presented with independent mobility.  Pt was able to transfer and ambulate safely and independently.  Pt demonstrated dynamic gait without LOB.  Did not perform stairs as pt with IVs in both arms and demonstrated the strength and mobility necessary to perform the 2 steps to enter his home without difficulty.  Pt safe to ambulate in halls independently.  No further PT indicated.     Follow Up Recommendations No PT follow up    Equipment Recommendations  None recommended by PT    Recommendations for Other Services       Precautions / Restrictions Precautions Precautions: None      Mobility  Bed Mobility Overal bed mobility: Independent                Transfers Overall transfer level: Independent               General transfer comment: Demonstrated sit to stand and stand pivot independently and safely  Ambulation/Gait Ambulation/Gait assistance: Supervision Gait Distance (Feet): 400 Feet Assistive device: None Gait Pattern/deviations: WFL(Within Functional Limits) Gait velocity: normal   General Gait Details: Normal gait without LOB; able to look up/down/L/R, turn, stop, step over and around objects, and march without LOB.  DId not do stairs due to IV in both arms and pt demonstrates mobility/strength necessary to perform stairs.  Stairs            Wheelchair Mobility    Modified Rankin (Stroke Patients Only)       Balance Overall balance assessment: Independent                                            Pertinent Vitals/Pain Pain Assessment: No/denies pain    Home Living Family/patient expects to be discharged to:: Private residence Living Arrangements: Parent Available Help at Discharge: Family;Available PRN/intermittently Type of Home: House Home Access: Stairs to enter   Entrance Stairs-Number of Steps: 2 Home Layout: Multi-level;Able to live on main level with bedroom/bathroom Home Equipment: Bedside commode;Walker - 2 wheels;Cane - single point      Prior Function Level of Independence: Independent               Hand Dominance        Extremity/Trunk Assessment   Upper Extremity Assessment Upper Extremity Assessment: Overall WFL for tasks assessed    Lower Extremity Assessment Lower Extremity Assessment: Overall WFL for tasks assessed    Cervical / Trunk Assessment Cervical / Trunk Assessment: Normal  Communication   Communication: No difficulties  Cognition Arousal/Alertness: Awake/alert Behavior During Therapy: WFL for tasks assessed/performed Overall Cognitive Status: Within Functional Limits for tasks assessed                                        General Comments      Exercises     Assessment/Plan    PT Assessment Patent does not  need any further PT services  PT Problem List         PT Treatment Interventions      PT Goals (Current goals can be found in the Care Plan section)  Acute Rehab PT Goals Patient Stated Goal: return home PT Goal Formulation: All assessment and education complete, DC therapy    Frequency     Barriers to discharge        Co-evaluation               AM-PAC PT "6 Clicks" Mobility  Outcome Measure Help needed turning from your back to your side while in a flat bed without using bedrails?: None Help needed moving from lying on your back to sitting on the side of a flat bed without using bedrails?: None Help needed moving to and from a bed to a chair (including a wheelchair)?:  None Help needed standing up from a chair using your arms (e.g., wheelchair or bedside chair)?: None Help needed to walk in hospital room?: None Help needed climbing 3-5 steps with a railing? : None 6 Click Score: 24    End of Session Equipment Utilized During Treatment: Gait belt Activity Tolerance: Patient tolerated treatment well Patient left: in bed;with call bell/phone within reach Nurse Communication: Mobility status      Time: 1430-1449 PT Time Calculation (min) (ACUTE ONLY): 19 min   Charges:   PT Evaluation $PT Eval Low Complexity: 1 Low          Maggie Font, PT Acute Rehab Services Pager [phone] [facility] [phone] [facility] [place] 09/01/2019, 3:25 PM

## 2019-09-01 NOTE — Social Work (Signed)
CSW acknowledging consult for substance use. Will evaluate pt needs and interest in resources.   Maranatha Grossi, MSW, LCSW [facility]   

## 2019-09-01 NOTE — Plan of Care (Signed)

## 2019-09-01 NOTE — Progress Notes (Signed)
eLink Physician-Brief Progress Note Patient Name: [name] DOB: [birth year] MRN: [id number]   Date of Service  09/01/2019  HPI/Events of Note  Hypokalemia - K+ = 2.8 and Creatinine = 0.77.  eICU Interventions  Will replace K+.      Intervention Category Major Interventions: Electrolyte abnormality - evaluation and management  Lillias Difrancesco Eugene 09/01/2019, 6:24 AM

## 2019-09-01 NOTE — Progress Notes (Signed)
Subjective: Has been tolerating full liquid diet, requesting for diet to be advanced. Had 3 bowel movements yesterday, 1 bowel movement-all without blood or black stools. Denies abdominal or chest pain.  Objective: Vital signs in last 24 hours: Temp:  [98.5 F (36.9 C)-98.9 F (37.2 C)] 98.5 F (36.9 C) (05/06 0726) Pulse Rate:  [79-104] 88 (05/06 0900) Resp:  [11-28] 17 (05/06 0900) BP: (109-150)/(62-92) 129/88 (05/06 0800) SpO2:  [97 %-99 %] 97 % (05/06 0900) Weight change:     PE: Sitting up on bed, appears comfortable, mild pallor, mild icterus GENERAL: Alert, oriented x3, no asterixis ABDOMEN:Soft, nondistended, nontender EXTREMITIES: No deformity  Lab Results: Results for orders placed or performed during the hospital encounter of 08/30/19 (from the past 48 hour(s))  Type and screen [facility]     Status: None (Preliminary result)   Collection Time: 08/30/19  2:30 PM  Result Value Ref Range   ABO/RH(D) O NEG    Antibody Screen NEG    Sample Expiration      09/02/2019,2359 Performed at [facility], [address] [place], [place] [address]    Unit Number [id number]    Blood Component Type RED CELLS,LR    Unit division 00    Status of Unit ISSUED,FINAL    Transfusion Status OK TO TRANSFUSE    Crossmatch Result Compatible    Unit Number [id number]    Blood Component Type RBC LR PHER1    Unit division 00    Status of Unit ISSUED,FINAL    Transfusion Status OK TO TRANSFUSE    Crossmatch Result COMPATIBLE    Unit Number [id number]    Blood Component Type RBC LR PHER2    Unit division 00    Status of Unit ALLOCATED    Transfusion Status OK TO TRANSFUSE    Crossmatch Result Compatible   ABO/Rh     Status: None   Collection Time: 08/30/19  2:30 PM  Result Value Ref Range   ABO/RH(D)      O NEG Performed at [facility], [address] [place], [place] [address]   Comprehensive metabolic panel     Status: Abnormal   Collection Time: 08/30/19  2:32 PM  Result Value Ref Range   Sodium 140 135 - 145 mmol/L   Potassium 5.9 (H) 3.5 - 5.1 mmol/L   Chloride 101 98 - 111 mmol/L   CO2 22 22 - 32 mmol/L   Glucose, Bld 134 (H) 70 - 99 mg/dL    Comment: Glucose reference range applies only to samples taken after fasting for at least 8 hours.   BUN 17 6 - 20 mg/dL   Creatinine, Ser 1.82 0.61 - 1.24 mg/dL   Calcium 8.5 (L) 8.9 - 10.3 mg/dL   Total Protein 6.6 6.5 - 8.1 g/dL   Albumin 2.9 (L) 3.5 - 5.0 g/dL   AST 993 (H) 15 - 41 U/L   ALT 96 (H) 0 - 44 U/L   Alkaline Phosphatase 66 38 - 126 U/L   Total Bilirubin 4.7 (H) 0.3 - 1.2 mg/dL   GFR calc non Af Amer >60 >60 mL/min   GFR calc Af Amer >60 >60 mL/min   Anion gap 17 (H) 5 - 15    Comment: Performed at [facility], [address], [place], [place] [address]  CBC     Status: Abnormal   Collection Time: 08/30/19  2:32 PM  Result Value Ref Range   WBC  18.9 (H) 4.0 - 10.5 K/uL   RBC 3.37 (L) 4.22 - 5.81 MIL/uL   Hemoglobin 11.3 (L) 13.0 - 17.0 g/dL   HCT 33.7 (L) 39.0 - 52.0 %   MCV 100.0 80.0 - 100.0 fL   MCH 33.5 26.0 - 34.0 pg   MCHC 33.5 30.0 - 36.0 g/dL   RDW 13.0 11.5 - 15.5 %   Platelets 267 150 - 400 K/uL   nRBC 0.0 0.0 - 0.2 %    Comment: Performed at [facility], [place] [address] [place], [place] [address]  Protime-INR     Status: Abnormal   Collection Time: 08/30/19  2:45 PM  Result Value Ref Range   Prothrombin Time 21.4 (H) 11.4 - 15.2 seconds   INR 1.9 (H) 0.8 - 1.2    Comment: (NOTE) INR goal varies based on device and disease states. Performed at [facility], [place] [address] [place], [place] [address]   APTT     Status: None   Collection Time: 08/30/19  2:45 PM  Result Value Ref Range   aPTT 31 24 - 36 seconds    Comment: Performed at [place] [address], [place], [place] [address]  Prepare RBC (crossmatch)     Status: None   Collection Time: 08/30/19  2:48 PM  Result Value Ref Range    Order Confirmation      ORDER PROCESSED BY BLOOD BANK Performed at [facility], [place] [address], [place], [place] [address]   Lactic acid, plasma     Status: Abnormal   Collection Time: 08/30/19  2:50 PM  Result Value Ref Range   Lactic Acid, Venous 8.0 (HH) 0.5 - 1.9 mmol/L    Comment: CRITICAL RESULT CALLED TO, READ BACK BY AND VERIFIED WITH: MAGGIE BARBER,RN AT 1550 08/30/2019 BY ZBEECH. Performed at [facility], [place] [address] [place], [place] [address]   Blood culture (routine x 2)     Status: None (Preliminary result)   Collection Time: 08/30/19  2:50 PM   Specimen: BLOOD  Result Value Ref Range   Specimen Description BLOOD RIGHT ANTECUBITAL    Special Requests      BOTTLES DRAWN AEROBIC AND ANAEROBIC Blood Culture adequate volume   Culture      NO GROWTH 2 DAYS Performed at [facility], [place] [address], [place], [place] [address]    Report Status PENDING   Ammonia     Status: Abnormal   Collection Time: 08/30/19  2:50 PM  Result Value Ref Range   Ammonia 47 (H) 9 - 35 umol/L    Comment: Performed at [facility], [place] [address], [place], [place] [address]  Blood culture (routine x 2)     Status: None (Preliminary result)   Collection Time: 08/30/19  3:00 PM   Specimen: BLOOD LEFT ARM  Result Value Ref Range   Specimen Description BLOOD LEFT ARM    Special Requests      BOTTLES DRAWN AEROBIC AND ANAEROBIC Blood Culture adequate volume   Culture      NO GROWTH 2 DAYS Performed at [facility], [place] [address] [place], [place] [address]    Report Status PENDING   Respiratory Panel by RT PCR (Flu A&B, Covid) - Nasopharyngeal Swab     Status: None   Collection Time: 08/30/19  3:00 PM   Specimen: Nasopharyngeal Swab  Result Value Ref Range   SARS Coronavirus 2  by RT PCR NEGATIVE NEGATIVE    Comment: (NOTE) SARS-CoV-2 target nucleic acids are NOT DETECTED. The SARS-CoV-2 RNA is generally detectable in upper respiratoy specimens during  the acute phase of infection. The lowest concentration of SARS-CoV-2 viral copies this assay can detect is 131 copies/mL. A negative result does not preclude SARS-Cov-2 infection and should not be used as the sole basis for treatment or other patient management decisions. A negative result may occur with  improper specimen collection/handling, submission of specimen other than nasopharyngeal swab, presence of viral mutation(s) within the areas targeted by this assay, and inadequate number of viral copies (<131 copies/mL). A negative result must be combined with clinical observations, patient history, and epidemiological information. The expected result is Negative. Fact Sheet for Patients:  https://www.moore.com/ Fact Sheet for Healthcare Providers:  https://www.young.biz/ This test is not yet ap proved or cleared by the [place] FDA and  has been authorized for detection and/or diagnosis of SARS-CoV-2 by FDA under an Emergency Use Authorization (EUA). This EUA will remain  in effect (meaning this test can be used) for the duration of the COVID-19 declaration under Section 564(b)(1) of the Act, 21 U.S.C. section 360bbb-3(b)(1), unless the authorization is terminated or revoked sooner.    Influenza A by PCR NEGATIVE NEGATIVE   Influenza B by PCR NEGATIVE NEGATIVE    Comment: (NOTE) The Xpert Xpress SARS-CoV-2/FLU/RSV assay is intended as an aid in  the diagnosis of influenza from Nasopharyngeal swab specimens and  should not be used as a sole basis for treatment. Nasal washings and  aspirates are unacceptable for Xpert Xpress SARS-CoV-2/FLU/RSV  testing. Fact Sheet for Patients: https://www.moore.com/ Fact Sheet for Healthcare Providers: https://www.young.biz/ This test is not yet approved or cleared by the [place] FDA and  has been authorized for detection and/or diagnosis of SARS-CoV-2 by  FDA  under an Emergency Use Authorization (EUA). This EUA will remain  in effect (meaning this test can be used) for the duration of the  Covid-19 declaration under Section 564(b)(1) of the Act, 21  U.S.C. section 360bbb-3(b)(1), unless the authorization is  terminated or revoked. Performed at [facility], [address] [place], [place] [address]   I-Stat Chem 8, ED     Status: Abnormal   Collection Time: 08/30/19  3:01 PM  Result Value Ref Range   Sodium 140 135 - 145 mmol/L   Potassium 5.8 (H) 3.5 - 5.1 mmol/L   Chloride 102 98 - 111 mmol/L   BUN 17 6 - 20 mg/dL   Creatinine, Ser 6.04 0.61 - 1.24 mg/dL   Glucose, Bld 540 (H) 70 - 99 mg/dL    Comment: Glucose reference range applies only to samples taken after fasting for at least 8 hours.   Calcium, Ion 0.95 (L) 1.15 - 1.40 mmol/L   TCO2 22 22 - 32 mmol/L   Hemoglobin 10.5 (L) 13.0 - 17.0 g/dL   HCT 98.1 (L) 19.1 - 47.8 %  Prepare RBC (crossmatch)     Status: None   Collection Time: 08/30/19  3:01 PM  Result Value Ref Range   Order Confirmation      ORDER PROCESSED BY BLOOD BANK Performed at [facility], [address], [place], [place] [address]   Occult bld gastric/duodenum (cup to lab)     Status: Abnormal   Collection Time: 08/30/19  3:30 PM  Result Value Ref Range   pH, Gastric NOT DONE    Occult Blood, Gastric POSITIVE (A) NEGATIVE  Comment: Performed at [facility], [address] [place], [place] [address]  Lactic acid, plasma     Status: Abnormal   Collection Time: 08/30/19  4:42 PM  Result Value Ref Range   Lactic Acid, Venous 6.0 (HH) 0.5 - 1.9 mmol/L    Comment: CRITICAL VALUE NOTED.  VALUE IS CONSISTENT WITH PREVIOUSLY REPORTED AND CALLED VALUE. Performed at [facility], [address], [place], [place] [address]   Prepare fresh frozen plasma     Status: None (Preliminary result)   Collection Time: 08/30/19  5:02 PM  Result Value Ref Range   Unit Number [id number]    Blood  Component Type THW PLS APHR    Unit division B0    Status of Unit ALLOCATED    Transfusion Status OK TO TRANSFUSE   Glucose, capillary     Status: Abnormal   Collection Time: 08/30/19  8:13 PM  Result Value Ref Range   Glucose-Capillary 168 (H) 70 - 99 mg/dL    Comment: Glucose reference range applies only to samples taken after fasting for at least 8 hours.   Comment 1 Notify RN   MRSA PCR Screening     Status: None   Collection Time: 08/30/19  8:32 PM   Specimen: Nasopharyngeal  Result Value Ref Range   MRSA by PCR NEGATIVE NEGATIVE    Comment:        The GeneXpert MRSA Assay (FDA approved for NASAL specimens only), is one component of a comprehensive MRSA colonization surveillance program. It is not intended to diagnose MRSA infection nor to guide or monitor treatment for MRSA infections. Performed at [facility], [address], [place], [place] [address]   Lactic acid, plasma     Status: Abnormal   Collection Time: 08/30/19  9:14 PM  Result Value Ref Range   Lactic Acid, Venous 3.9 (HH) 0.5 - 1.9 mmol/L    Comment: CRITICAL VALUE NOTED.  VALUE IS CONSISTENT WITH PREVIOUSLY REPORTED AND CALLED VALUE. Performed at [facility], [address], [place], [place] [address]   Hemoglobin and hematocrit, blood     Status: Abnormal   Collection Time: 08/30/19  9:14 PM  Result Value Ref Range   Hemoglobin 11.1 (L) 13.0 - 17.0 g/dL   HCT 65.9 (L) 93.5 - 70.1 %    Comment: Performed at [facility], [address], [place], [place] [address]  Potassium     Status: None   Collection Time: 08/30/19  9:14 PM  Result Value Ref Range   Potassium 4.7 3.5 - 5.1 mmol/L    Comment: Performed at [facility], [address] [place], [place] [address]  Lactic acid, plasma     Status: Abnormal   Collection Time: 08/30/19 11:49 PM  Result Value Ref Range   Lactic Acid, Venous 3.4 (HH) 0.5 - 1.9 mmol/L    Comment: CRITICAL VALUE NOTED.  VALUE IS CONSISTENT WITH  PREVIOUSLY REPORTED AND CALLED VALUE. Performed at [facility], [address] [place], [place] [address]   Hemoglobin and hematocrit, blood     Status: Abnormal   Collection Time: 08/30/19 11:49 PM  Result Value Ref Range   Hemoglobin 10.4 (L) 13.0 - 17.0 g/dL   HCT 76.2 (L) 26.3 - 33.5 %    Comment: Performed at [facility], [address], [place], [place] [address]  CBC     Status: Abnormal   Collection Time:  08/31/19  5:36 AM  Result Value Ref Range   WBC 18.6 (H) 4.0 - 10.5 K/uL   RBC 3.12 (L) 4.22 - 5.81 MIL/uL   Hemoglobin 10.2 (L) 13.0 - 17.0 g/dL   HCT 23.5 (L) 36.1 - 44.3 %   MCV 94.6 80.0 - 100.0 fL   MCH 32.7 26.0 - 34.0 pg   MCHC 34.6 30.0 - 36.0 g/dL   RDW 15.4 (H) 00.8 - 67.6 %   Platelets 90 (L) 150 - 400 K/uL    Comment: Immature Platelet Fraction may be clinically indicated, consider ordering this additional test [id number] PLATELET COUNT CONFIRMED BY SMEAR REPEATED TO VERIFY    nRBC 0.0 0.0 - 0.2 %    Comment: Performed at [facility], [address], [place], [place] [address]  Magnesium     Status: Abnormal   Collection Time: 08/31/19  5:36 AM  Result Value Ref Range   Magnesium 1.1 (L) 1.7 - 2.4 mg/dL    Comment: Performed at [facility], [address] [place], [place] [address]  Comprehensive metabolic panel     Status: Abnormal   Collection Time: 08/31/19  5:36 AM  Result Value Ref Range   Sodium 142 135 - 145 mmol/L   Potassium 3.5 3.5 - 5.1 mmol/L   Chloride 106 98 - 111 mmol/L   CO2 26 22 - 32 mmol/L   Glucose, Bld 111 (H) 70 - 99 mg/dL    Comment: Glucose reference range applies only to samples taken after fasting for at least 8 hours.   BUN 14 6 - 20 mg/dL   Creatinine, Ser 3.38 0.61 - 1.24 mg/dL   Calcium 7.8 (L) 8.9 - 10.3 mg/dL   Total Protein 5.4 (L) 6.5 - 8.1 g/dL   Albumin 2.8 (L) 3.5 - 5.0 g/dL   AST 2,505 (H) 15 - 41 U/L   ALT 352 (H) 0 - 44 U/L   Alkaline Phosphatase 48 38 - 126 U/L   Total  Bilirubin 4.3 (H) 0.3 - 1.2 mg/dL   GFR calc non Af Amer >60 >60 mL/min   GFR calc Af Amer >60 >60 mL/min   Anion gap 10 5 - 15    Comment: Performed at [facility], [address], [place], [place] [address]  Glucose, capillary     Status: Abnormal   Collection Time: 08/31/19  7:15 AM  Result Value Ref Range   Glucose-Capillary 108 (H) 70 - 99 mg/dL    Comment: Glucose reference range applies only to samples taken after fasting for at least 8 hours.  Provider-confirm verbal Blood Bank order - RBC, Type & Screen; 1 Unit; Order taken: 08/30/2019; 3:03 PM; Emergency Release 1 RBC UNIT TRANSFUSED     Status: None   Collection Time: 08/31/19  9:44 AM  Result Value Ref Range   Blood product order confirm      MD AUTHORIZATION REQUESTED Performed at [facility], [address] [place], [place] [address]   Hemoglobin and hematocrit, blood     Status: Abnormal   Collection Time: 08/31/19 10:42 AM  Result Value Ref Range   Hemoglobin 9.8 (L) 13.0 - 17.0 g/dL   HCT 79.0 (L) 24.0 - 97.3 %    Comment: Performed at [facility], [address] [place], [place] [address]  Hemoglobin and hematocrit, blood     Status: Abnormal   Collection Time: 08/31/19  4:39 PM  Result Value Ref Range  Hemoglobin 10.0 (L) 13.0 - 17.0 g/dL   HCT 65.729.0 (L) 84.639.0 - 96.252.0 %    Comment: Performed at [facility], [address] [place], [place] [address]  Comprehensive metabolic panel     Status: Abnormal   Collection Time: 09/01/19  3:39 AM  Result Value Ref Range   Sodium 140 135 - 145 mmol/L   Potassium 2.8 (L) 3.5 - 5.1 mmol/L   Chloride 107 98 - 111 mmol/L   CO2 27 22 - 32 mmol/L   Glucose, Bld 114 (H) 70 - 99 mg/dL    Comment: Glucose reference range applies only to samples taken after fasting for at least 8 hours.   BUN 5 (L) 6 - 20 mg/dL   Creatinine, Ser 1.320.77 0.61 - 1.24 mg/dL   Calcium 7.0 (L) 8.9 - 10.3 mg/dL   Total Protein 4.9 (L) 6.5 - 8.1 g/dL   Albumin 2.4 (L) 3.5 - 5.0  g/dL   AST 440837 (H) 15 - 41 U/L   ALT 319 (H) 0 - 44 U/L   Alkaline Phosphatase 45 38 - 126 U/L   Total Bilirubin 1.9 (H) 0.3 - 1.2 mg/dL   GFR calc non Af Amer >60 >60 mL/min   GFR calc Af Amer >60 >60 mL/min   Anion gap 6 5 - 15    Comment: Performed at [facility], [address] [place], [place] [address]  Magnesium     Status: None   Collection Time: 09/01/19  3:39 AM  Result Value Ref Range   Magnesium 1.8 1.7 - 2.4 mg/dL    Comment: Performed at [facility], [address] [place], [place] [address]  Phosphorus     Status: Abnormal   Collection Time: 09/01/19  3:39 AM  Result Value Ref Range   Phosphorus 1.4 (L) 2.5 - 4.6 mg/dL    Comment: Performed at [facility], [address] [place], [place] [address]  CBC with Differential/Platelet     Status: Abnormal   Collection Time: 09/01/19  3:39 AM  Result Value Ref Range   WBC 13.6 (H) 4.0 - 10.5 K/uL   RBC 2.86 (L) 4.22 - 5.81 MIL/uL   Hemoglobin 9.2 (L) 13.0 - 17.0 g/dL   HCT 42.527.5 (L) 95.639.0 - 38.752.0 %   MCV 96.2 80.0 - 100.0 fL   MCH 32.2 26.0 - 34.0 pg   MCHC 33.5 30.0 - 36.0 g/dL   RDW 56.416.6 (H) 33.211.5 - 95.115.5 %   Platelets 75 (L) 150 - 400 K/uL    Comment: SPECIMEN CHECKED FOR CLOTS CONSISTENT WITH PREVIOUS RESULT Immature Platelet Fraction may be clinically indicated, consider ordering this additional test [id number] REPEATED TO VERIFY    nRBC 0.3 (H) 0.0 - 0.2 %   Neutrophils Relative % 78 %   Neutro Abs 10.7 (H) 1.7 - 7.7 K/uL   Lymphocytes Relative 8 %   Lymphs Abs 1.1 0.7 - 4.0 K/uL   Monocytes Relative 10 %   Monocytes Absolute 1.3 (H) 0.1 - 1.0 K/uL   Eosinophils Relative 2 %   Eosinophils Absolute 0.2 0.0 - 0.5 K/uL   Basophils Relative 1 %   Basophils Absolute 0.1 0.0 - 0.1 K/uL   Immature Granulocytes 1 %   Abs Immature Granulocytes 0.15 (H) 0.00 - 0.07 K/uL    Comment: Performed at [facility], [address] [place], [place] [address]  Protime-INR     Status: Abnormal  Collection Time: 09/01/19  8:05 AM  Result Value Ref Range   Prothrombin Time 21.1 (H) 11.4 - 15.2 seconds   INR 1.9 (H) 0.8 - 1.2    Comment: (NOTE) INR goal varies based on device and disease states. Performed at [facility], [address], [place], [place] [address]     Studies/Results: DG Chest Portable 1 View  Result Date: 08/30/2019 CLINICAL DATA:  Shortness of breath. EXAM: PORTABLE CHEST 1 VIEW COMPARISON:  05/26/2018. FINDINGS: Mediastinum and hilar structures normal. Lungs are clear. No pleural effusion or pneumothorax. Heart size normal. Thoracic spine scoliosis concave left. No acute bony abnormality identified. IMPRESSION: No acute cardiopulmonary disease. Electronically Signed   By: Maisie Fus  Register   On: 08/30/2019 15:12    Medications: I have reviewed the patient's current medications.  Assessment: Hematemesis secondary to esophageal varices, status post EGD and banding, continued on IV octreotide, hemoglobin remained stable at 9.8/10/9.2 Remains hemodynamically stable Hypokalemia, hypomagnesemia, hypophosphatemia-replacements have been ordered LFTs improving, T bili/AST/ALT/ALP of one-point 9/837/319/45  MELDNa of 16 today Hepatic discriminant function of 44 today(improving with prednisolone)  Plan: Will start regular diet Will start nadolol 20 mg daily, dose to be adjusted as long as systolic blood pressure is above and heart rate is above 55/min.  Okay to discontinue IV octreotide today evening.  Continue folic acid, multivitamin and thiamine  Continue IV ceftriaxone for total of 5 days(GI bleed in a patient with cirrhosis)  Patient's PT/INR will likely chronically remain elevated, will DC order for vitamin K, not indicated currently.  Most likely discharge in a.m..  To be noted patient was dismissed from [facility] gastroenterology/Dr. Schooler's practice for noncompliance with follow-ups.  Patient states he wants to follow-up with [facility] as an outpatient for cirrhosis and its management.  Will change Protonix to once a day  Kerin Salen, MD 09/01/2019, 9:37 AM

## 2019-09-01 NOTE — Progress Notes (Signed)
Pharmacy Electrolyte Replacement  Recent Labs:  Recent Labs    09/01/19 0339  K 2.8*  MG 1.8  PHOS 1.4*  CREATININE 0.77    Low Critical Values (K </= 2.5, Phos </= 1, Mg </= 1) Present: None  Plan: Kphos 45 mmol x 1 KCl PO 20 meq x 1 Recheck phos at 2000  Elmer Sow, PharmD, BCPS, BCCCP Clinical Pharmacist [phone]  Please check AMION for all [facility] Pharmacy numbers  09/01/2019 7:40 AM

## 2019-09-01 NOTE — Progress Notes (Signed)
PROGRESS NOTE    [name]  OMQ:[id number] DOB: Nov 03, 1980 DOA: 08/30/2019 PCP: Farris Has, MD   Brief Narrative: -year-old with past medical history significant for EtOH related cirrhosis, esophageal varices, GERD who present with hematemesis for the last 6 hours prior to admission.  Large-volume bleeding with bright red blood and clots.  Last alcoholic drink was 2 days prior to admission.  Patient presented with hypotension, tachycardia.  Received IV fluids and blood transfusion.  He was a started on octreotide.  Appendectomy on 5/4 and had banding of esophageal varices.     Assessment & Plan:   Active Problems:   Shock (HCC)  1-Upper GI bleed secondary to varices.: Status post banding on 5/4, x6 banding. Continue with octreotide 72 hours.  Plan to discontinue octreotide tried today Continue with IV ceftriaxone to cover for SBP Received total of 2 unit of packed red blood cell. Continue to monitor hemoglobin.  EtOH cirrhosis: Plan to start nadolol today. Vitamin K order  GERD; continue with PPI  History of hepatic encephalopathy: Alert and oriented.  EtOH abuse: Continue with folic acid and thiamine. Continue with withdrawal protocol.  Lactic acidosis: Resolved.    Estimated body mass index is 21.06 kg/m as calculated from the following:   Height as of this encounter: 5\' 7"  (1.702 m).   Weight as of this encounter: 61 kg.   DVT prophylaxis: SCDs Code Status: Full code Family Communication: Discussed with patient Disposition Plan:  Patient is from: Home Anticipated d/c date: 5\7 if no further GI bleed and hemoglobin remained stable. Anticipated discharge to: Home Barriers to d/c or necessity for inpatient status: Remain in the hospital today to complete octeotride and repeat hb in am.   Consultants:   GI  Procedures:   Endoscopy status post banding of esophageal varices x6  Antimicrobials:  Ceftriaxone  Subjective: He denies abdominal  pain, he has been tolerating diet.  He had 3 bowel movement getting lighter, no black stool  Objective: Vitals:   09/01/19 0400 09/01/19 0423 09/01/19 0600 09/01/19 0726  BP: 110/68  125/88   Pulse: 81  79   Resp: 13  12   Temp:  98.5 F (36.9 C)  98.5 F (36.9 C)  TempSrc:  Oral  Oral  SpO2: 97%  97%   Weight:      Height:        Intake/Output Summary (Last 24 hours) at 09/01/2019 0749 Last data filed at 09/01/2019 0600 Gross per 24 hour  Intake 4334.76 ml  Output 250 ml  Net 4084.76 ml   Filed Weights   08/30/19 1420 08/31/19 0500  Weight: 61.2 kg 61 kg    Examination:  General exam: Appears calm and comfortable  Respiratory system: Clear to auscultation. Respiratory effort normal. Cardiovascular system: S1 & S2 heard, RRR. No JVD, murmurs, rubs, gallops or clicks. No pedal edema. Gastrointestinal system: Abdomen is nondistended, soft and nontender. No organomegaly or masses felt. Normal bowel sounds heard. Central nervous system: Alert and oriented. No focal neurological deficits. Extremities: Symmetric 5 x 5 power.    Data Reviewed: I have personally reviewed following labs and imaging studies  CBC: Recent Labs  Lab 08/30/19 1432 08/30/19 1501 08/30/19 2349 08/31/19 0536 08/31/19 1042 08/31/19 1639 09/01/19 0339  WBC 18.9*  --   --  18.6*  --   --  13.6*  NEUTROABS  --   --   --   --   --   --  10.7*  HGB 11.3*   < >  10.4* 10.2* 9.8* 10.0* 9.2*  HCT 33.7*   < > 30.5* 29.5* 28.7* 29.0* 27.5*  MCV 100.0  --   --  94.6  --   --  96.2  PLT 267  --   --  90*  --   --  75*   < > = values in this interval not displayed.   Basic Metabolic Panel: Recent Labs  Lab 08/30/19 1432 08/30/19 1501 08/30/19 2114 08/31/19 0536 09/01/19 0339  NA 140 140  --  142 140  K 5.9* 5.8* 4.7 3.5 2.8*  CL 101 102  --  106 107  CO2 22  --   --  26 27  GLUCOSE 134* 121*  --  111* 114*  BUN 17 17  --  14 5*  CREATININE 1.16 1.10  --  1.01 0.77  CALCIUM 8.5*  --   --  7.8*  7.0*  MG  --   --   --  1.1* 1.8  PHOS  --   --   --   --  1.4*   GFR: Estimated Creatinine Clearance: 107 mL/min (by C-G formula based on SCr of 0.77 mg/dL). Liver Function Tests: Recent Labs  Lab 08/30/19 1432 08/31/19 0536 09/01/19 0339  AST 288* 1,185* 837*  ALT 96* 352* 319*  ALKPHOS 66 48 45  BILITOT 4.7* 4.3* 1.9*  PROT 6.6 5.4* 4.9*  ALBUMIN 2.9* 2.8* 2.4*   No results for input(s): LIPASE, AMYLASE in the last 168 hours. Recent Labs  Lab 08/30/19 1450  AMMONIA 47*   Coagulation Profile: Recent Labs  Lab 08/30/19 1445  INR 1.9*   Cardiac Enzymes: No results for input(s): CKTOTAL, CKMB, CKMBINDEX, TROPONINI in the last 168 hours. BNP (last 3 results) No results for input(s): PROBNP in the last 8760 hours. HbA1C: No results for input(s): HGBA1C in the last 72 hours. CBG: Recent Labs  Lab 08/30/19 2013 08/31/19 0715  GLUCAP 168* 108*   Lipid Profile: No results for input(s): CHOL, HDL, LDLCALC, TRIG, CHOLHDL, LDLDIRECT in the last 72 hours. Thyroid Function Tests: No results for input(s): TSH, T4TOTAL, FREET4, T3FREE, THYROIDAB in the last 72 hours. Anemia Panel: No results for input(s): VITAMINB12, FOLATE, FERRITIN, TIBC, IRON, RETICCTPCT in the last 72 hours. Sepsis Labs: Recent Labs  Lab 08/30/19 1450 08/30/19 1642 08/30/19 2114 08/30/19 2349  LATICACIDVEN 8.0* 6.0* 3.9* 3.4*    Recent Results (from the past 240 hour(s))  Blood culture (routine x 2)     Status: None (Preliminary result)   Collection Time: 08/30/19  2:50 PM   Specimen: BLOOD  Result Value Ref Range Status   Specimen Description BLOOD RIGHT ANTECUBITAL  Final   Special Requests   Final    BOTTLES DRAWN AEROBIC AND ANAEROBIC Blood Culture adequate volume   Culture   Final    NO GROWTH 2 DAYS Performed at [facility], [address], [place], [place] [address]    Report Status PENDING  Incomplete  Blood culture (routine x 2)     Status: None (Preliminary result)    Collection Time: 08/30/19  3:00 PM   Specimen: BLOOD LEFT ARM  Result Value Ref Range Status   Specimen Description BLOOD LEFT ARM  Final   Special Requests   Final    BOTTLES DRAWN AEROBIC AND ANAEROBIC Blood Culture adequate volume   Culture   Final    NO GROWTH 2 DAYS Performed at [facility], [address] [place], [place] [address]  Report Status PENDING  Incomplete  Respiratory Panel by RT PCR (Flu A&B, Covid) - Nasopharyngeal Swab     Status: None   Collection Time: 08/30/19  3:00 PM   Specimen: Nasopharyngeal Swab  Result Value Ref Range Status   SARS Coronavirus 2 by RT PCR NEGATIVE NEGATIVE Final    Comment: (NOTE) SARS-CoV-2 target nucleic acids are NOT DETECTED. The SARS-CoV-2 RNA is generally detectable in upper respiratoy specimens during the acute phase of infection. The lowest concentration of SARS-CoV-2 viral copies this assay can detect is 131 copies/mL. A negative result does not preclude SARS-Cov-2 infection and should not be used as the sole basis for treatment or other patient management decisions. A negative result may occur with  improper specimen collection/handling, submission of specimen other than nasopharyngeal swab, presence of viral mutation(s) within the areas targeted by this assay, and inadequate number of viral copies (<131 copies/mL). A negative result must be combined with clinical observations, patient history, and epidemiological information. The expected result is Negative. Fact Sheet for Patients:  https://www.moore.com/ Fact Sheet for Healthcare Providers:  https://www.young.biz/ This test is not yet ap proved or cleared by the [place] FDA and  has been authorized for detection and/or diagnosis of SARS-CoV-2 by FDA under an Emergency Use Authorization (EUA). This EUA will remain  in effect (meaning this test can be used) for the duration of the COVID-19 declaration under Section  564(b)(1) of the Act, 21 U.S.C. section 360bbb-3(b)(1), unless the authorization is terminated or revoked sooner.    Influenza A by PCR NEGATIVE NEGATIVE Final   Influenza B by PCR NEGATIVE NEGATIVE Final    Comment: (NOTE) The Xpert Xpress SARS-CoV-2/FLU/RSV assay is intended as an aid in  the diagnosis of influenza from Nasopharyngeal swab specimens and  should not be used as a sole basis for treatment. Nasal washings and  aspirates are unacceptable for Xpert Xpress SARS-CoV-2/FLU/RSV  testing. Fact Sheet for Patients: https://www.moore.com/ Fact Sheet for Healthcare Providers: https://www.young.biz/ This test is not yet approved or cleared by the [place] FDA and  has been authorized for detection and/or diagnosis of SARS-CoV-2 by  FDA under an Emergency Use Authorization (EUA). This EUA will remain  in effect (meaning this test can be used) for the duration of the  Covid-19 declaration under Section 564(b)(1) of the Act, 21  U.S.C. section 360bbb-3(b)(1), unless the authorization is  terminated or revoked. Performed at [facility], [address], [place], [place] [address]   MRSA PCR Screening     Status: None   Collection Time: 08/30/19  8:32 PM   Specimen: Nasopharyngeal  Result Value Ref Range Status   MRSA by PCR NEGATIVE NEGATIVE Final    Comment:        The GeneXpert MRSA Assay (FDA approved for NASAL specimens only), is one component of a comprehensive MRSA colonization surveillance program. It is not intended to diagnose MRSA infection nor to guide or monitor treatment for MRSA infections. Performed at [facility], [address] [place], [place] [address]          Radiology Studies: DG Chest Portable 1 View  Result Date: 08/30/2019 CLINICAL DATA:  Shortness of breath. EXAM: PORTABLE CHEST 1 VIEW COMPARISON:  05/26/2018. FINDINGS: Mediastinum and hilar structures normal. Lungs are clear. No  pleural effusion or pneumothorax. Heart size normal. Thoracic spine scoliosis concave left. No acute bony abnormality identified. IMPRESSION: No acute cardiopulmonary disease. Electronically Signed   By: Maisie Fus  Register  On: 08/30/2019 15:12        Scheduled Meds: . sodium chloride   Intravenous Once  . Chlorhexidine Gluconate Cloth  6 each Topical Daily  . folic acid  1 mg Oral Daily  . LORazepam  0-4 mg Intravenous Q6H   Or  . LORazepam  0-4 mg Oral Q6H  . LORazepam  0-4 mg Intravenous Q12H   Or  . LORazepam  0-4 mg Oral Q12H  . pantoprazole  40 mg Oral BID AC  . potassium chloride  20 mEq Oral Once  . thiamine  100 mg Oral Daily   Continuous Infusions: . sodium chloride 125 mL/hr at 09/01/19 0600  . sodium chloride    . sodium chloride    . sodium chloride    . cefTRIAXone (ROCEPHIN)  IV Stopped (08/31/19 1903)  . magnesium sulfate bolus IVPB    . octreotide  (SANDOSTATIN)    IV infusion 50 mcg/hr (09/01/19 0600)  . potassium chloride    . potassium PHOSPHATE IVPB (in mmol)       LOS: 2 days    Time spent: 35 minutes.     Elmarie Shiley, MD [facility]   If 7PM-7AM, please contact night-coverage [email or web address]  09/01/2019, 7:49 AM

## 2019-09-02 DIAGNOSIS — I8501 Esophageal varices with bleeding: Secondary | ICD-10-CM

## 2019-09-02 DIAGNOSIS — R7989 Other specified abnormal findings of blood chemistry: Secondary | ICD-10-CM

## 2019-09-02 LAB — TYPE AND SCREEN
ABO/RH(D): O NEG
Antibody Screen: NEGATIVE
Unit division: 0
Unit division: 0
Unit division: 0

## 2019-09-02 LAB — BPAM RBC
Blood Product Expiration Date: 202105102359
Blood Product Expiration Date: 202105192359
Blood Product Expiration Date: 202105312359
ISSUE DATE / TIME: 202104281741
ISSUE DATE / TIME: 202105041503
ISSUE DATE / TIME: 202105041540
Unit Type and Rh: 5100
Unit Type and Rh: 9500
Unit Type and Rh: 9500

## 2019-09-02 LAB — COMPREHENSIVE METABOLIC PANEL
ALT: 244 U/L — ABNORMAL HIGH (ref 0–44)
AST: 435 U/L — ABNORMAL HIGH (ref 15–41)
Albumin: 2.4 g/dL — ABNORMAL LOW (ref 3.5–5.0)
Alkaline Phosphatase: 51 U/L (ref 38–126)
Anion gap: 5 (ref 5–15)
BUN: <5 mg/dL — ABNORMAL LOW (ref 6–20)
CO2: 28 mmol/L (ref 22–32)
Calcium: 7.5 mg/dL — ABNORMAL LOW (ref 8.9–10.3)
Chloride: 106 mmol/L (ref 98–111)
Creatinine, Ser: 0.8 mg/dL (ref 0.61–1.24)
GFR calc Af Amer: >60 mL/min (ref >60)
GFR calc non Af Amer: >60 mL/min (ref >60)
Glucose, Bld: 120 mg/dL — ABNORMAL HIGH (ref 70–99)
Potassium: 3.3 mmol/L — ABNORMAL LOW (ref 3.5–5.1)
Sodium: 139 mmol/L (ref 135–145)
Total Bilirubin: 1.5 mg/dL — ABNORMAL HIGH (ref 0.3–1.2)
Total Protein: 5.2 g/dL — ABNORMAL LOW (ref 6.5–8.1)

## 2019-09-02 MED ORDER — POTASSIUM CHLORIDE CRYS ER 20 MEQ PO TBCR
40.0000 meq | EXTENDED_RELEASE_TABLET | Freq: Once | ORAL | Status: AC
  Administered 2019-09-02: 40 meq via ORAL
  Filled 2019-09-02: qty 2

## 2019-09-02 MED ORDER — SERTRALINE HCL 100 MG PO TABS: 100.0000 mg | ORAL_TABLET | Freq: Every day | ORAL | 0 refills | Status: DC

## 2019-09-02 MED ORDER — POTASSIUM CHLORIDE CRYS ER 20 MEQ PO TBCR: 20.0000 meq | EXTENDED_RELEASE_TABLET | Freq: Every day | ORAL | 0 refills | Status: DC

## 2019-09-02 MED ORDER — THIAMINE HCL 100 MG PO TABS: 100.0000 mg | ORAL_TABLET | Freq: Every day | ORAL | Status: AC

## 2019-09-02 MED ORDER — CEFUROXIME AXETIL 250 MG/5ML PO SUSR: 10.0000 mg/kg | Freq: Two times a day (BID) | ORAL | 0 refills | Status: AC

## 2019-09-02 MED ORDER — NADOLOL 20 MG PO TABS: 20.0000 mg | ORAL_TABLET | Freq: Every day | ORAL | 0 refills | Status: DC

## 2019-09-02 MED ORDER — MAGNESIUM OXIDE 400 (241.3 MG) MG PO TABS: 400.0000 mg | ORAL_TABLET | Freq: Two times a day (BID) | ORAL | 0 refills | Status: DC

## 2019-09-02 MED ORDER — IRON 325 (65 FE) MG PO TABS: 325.0000 mg | ORAL_TABLET | Freq: Every day | ORAL | 0 refills | Status: AC

## 2019-09-02 MED ORDER — LACTULOSE 10 GM/15ML PO SOLN: 30.0000 g | Freq: Two times a day (BID) | ORAL | 0 refills | Status: DC

## 2019-09-02 MED ORDER — FOLIC ACID 1 MG PO TABS: 1.0000 mg | ORAL_TABLET | Freq: Every day | ORAL | Status: AC

## 2019-09-02 MED ORDER — OMEPRAZOLE 20 MG PO CPDR: 20.0000 mg | DELAYED_RELEASE_CAPSULE | Freq: Every day | ORAL | 0 refills | Status: DC

## 2019-09-02 NOTE — Discharge Summary (Signed)
Physician Discharge Summary  Delwin Raczkowski [facility] AJO:[id number] DOB: 1981-02-27 DOA: 08/30/2019  PCP: London Pepper, MD  Admit date: 08/30/2019 Discharge date: 09/02/2019  Admitted From: Home  Disposition: Home   Recommendations for Outpatient Follow-up:  1. Follow up with PCP in 1-2 weeks 2. Please obtain BMP/CBC in one week  3. Continue to encourage alcohol cessation.  4. Follow up with Gastroenterologist for further care of liver failure  Home Health: none  Discharge Condition: Stable.  CODE STATUS: Full code Diet recommendation: Heart Healthy    Brief/Interim Summary: 39 year-old with past medical history significant for EtOH related cirrhosis, esophageal varices, GERD who present with hematemesis for the last 6 hours prior to admission.  Large-volume bleeding with bright red blood and clots.  Last alcoholic drink was 2 days prior to admission.  Patient presented with hypotension, tachycardia.  Received IV fluids and blood transfusion.  He was a started on octreotide.  Appendectomy on 5/4 and had banding of esophageal varices.   1-Upper GI bleed secondary to varices.: Hemorrhagic Shock; Status post banding on 5/4, x6 banding. Treated  with octreotide 72 hours.   Treated  with IV ceftriaxone to cover for SBP. Discharge of ceftin to complete 7 days.  Received total of 2 unit of packed red blood cell. Continue to monitor hemoglobin. hb remain, vitals stable, no further bleeding.  He will need endoscopy in 2-3 months.   EtOH cirrhosis: Continue with nadolol.  Vitamin K order  GERD; continue with PPI  History of hepatic encephalopathy: Alert and oriented. Resume lactulose at discharge.   EtOH abuse: Continue with folic acid and thiamine. Continue with withdrawal protocol. No evidence of withdrawal.   Lactic acidosis: Resolved.   Discharge Diagnoses:  Active Problems:   Acute blood loss anemia   Alcohol abuse   Elevated INR   Hypokalemia   Alcoholic cirrhosis  (HCC)   Acute upper GI bleed   Hemorrhagic shock (HCC)   Shock (HCC)   Elevated lactic acid level   Bleeding esophageal varices (HCC)    Discharge Instructions  Discharge Instructions    Diet - low sodium heart healthy   Complete by: As directed    Increase activity slowly   Complete by: As directed      Allergies as of 09/02/2019      Reactions   Penicillins Hives      Medication List    STOP taking these medications   furosemide 20 MG tablet Commonly known as: LASIX     TAKE these medications   cefUROXime 250 MG/5ML suspension Commonly known as: CEFTIN Take 12.2 mLs (610 mg total) by mouth 2 (two) times daily for 5 days.   folic acid 1 MG tablet Commonly known as: FOLVITE Take 1 tablet (1 mg total) by mouth daily. Notes to patient: Next dose is due tomorrow at 10:00 AM   Iron 325 (65 Fe) MG Tabs Take 1 tablet (325 mg total) by mouth daily. What changed:   medication strength  how much to take Notes to patient: Resume to your regular schedule   lactulose 10 GM/15ML solution Commonly known as: CHRONULAC Take 45 mLs (30 g total) by mouth 2 (two) times daily. What changed: when to take this Notes to patient: Resume to your regular schedule   magnesium oxide 400 (241.3 Mg) MG tablet Commonly known as: MAG-OX Take 1 tablet (400 mg total) by mouth 2 (two) times daily. Notes to patient: Next dose is due this evening at 10:00 PM  multivitamin with minerals Tabs tablet Take 1 tablet by mouth daily. Notes to patient: Resume to your regular schedule   nadolol 20 MG tablet Commonly known as: CORGARD Take 1 tablet (20 mg total) by mouth daily. Notes to patient: Next dose is due tomorrow at 10:00 AM   omeprazole 20 MG capsule Commonly known as: PRILOSEC Take 1 capsule (20 mg total) by mouth daily. Notes to patient: Resume to your regular schedule   potassium chloride SA 20 MEQ tablet Commonly known as: KLOR-CON Take 1 tablet (20 mEq total) by mouth  daily. What changed: when to take this Notes to patient: Next dose is due tomorrow at 10:00 AM   sertraline 100 MG tablet Commonly known as: ZOLOFT Take 1 tablet (100 mg total) by mouth daily. Notes to patient: Resume to your regular schedule   thiamine 100 MG tablet Take 1 tablet (100 mg total) by mouth daily. Notes to patient: Next dose is due tomorrow at 10:00 AM       Allergies  Allergen Reactions  . Penicillins Hives    Consultations: GI CCM  Procedures/Studies: DG Chest Portable 1 View  Result Date: 08/30/2019 CLINICAL DATA:  Shortness of breath. EXAM: PORTABLE CHEST 1 VIEW COMPARISON:  05/26/2018. FINDINGS: Mediastinum and hilar structures normal. Lungs are clear. No pleural effusion or pneumothorax. Heart size normal. Thoracic spine scoliosis concave left. No acute bony abnormality identified. IMPRESSION: No acute cardiopulmonary disease. Electronically Signed   By: Maisie Fus  Register   On: 08/30/2019 15:12     Subjective: No further bleeding.   Discharge Exam: Vitals:   09/02/19 0011 09/02/19 0541  BP: 121/87 119/89  Pulse: 83 82  Resp: 19 19  Temp: 98.7 F (37.1 C) 98.6 F (37 C)  SpO2: 98% 98%     General: Pt is alert, awake, not in acute distress Cardiovascular: RRR, S1/S2 +, no rubs, no gallops Respiratory: CTA bilaterally, no wheezing, no rhonchi Abdominal: Soft, NT, ND, bowel sounds + Extremities: no edema, no cyanosis    The results of significant diagnostics from this hospitalization (including imaging, microbiology, ancillary and laboratory) are listed below for reference.     Microbiology: Recent Results (from the past 240 hour(s))  Blood culture (routine x 2)     Status: None (Preliminary result)   Collection Time: 08/30/19  2:50 PM   Specimen: BLOOD  Result Value Ref Range Status   Specimen Description BLOOD RIGHT ANTECUBITAL  Final   Special Requests   Final    BOTTLES DRAWN AEROBIC AND ANAEROBIC Blood Culture adequate volume    Culture   Final    NO GROWTH 3 DAYS Performed at [facility], [address] [place], [place] [address]    Report Status PENDING  Incomplete  Blood culture (routine x 2)     Status: None (Preliminary result)   Collection Time: 08/30/19  3:00 PM   Specimen: BLOOD LEFT ARM  Result Value Ref Range Status   Specimen Description BLOOD LEFT ARM  Final   Special Requests   Final    BOTTLES DRAWN AEROBIC AND ANAEROBIC Blood Culture adequate volume   Culture   Final    NO GROWTH 3 DAYS Performed at [facility], [address], [place], [place] [address]    Report Status PENDING  Incomplete  Respiratory Panel by RT PCR (Flu A&B, Covid) - Nasopharyngeal Swab     Status: None   Collection Time: 08/30/19  3:00 PM   Specimen: Nasopharyngeal Swab  Result Value Ref Range Status   SARS Coronavirus 2 by RT PCR NEGATIVE NEGATIVE Final    Comment: (NOTE) SARS-CoV-2 target nucleic acids are NOT DETECTED. The SARS-CoV-2 RNA is generally detectable in upper respiratoy specimens during the acute phase of infection. The lowest concentration of SARS-CoV-2 viral copies this assay can detect is 131 copies/mL. A negative result does not preclude SARS-Cov-2 infection and should not be used as the sole basis for treatment or other patient management decisions. A negative result may occur with  improper specimen collection/handling, submission of specimen other than nasopharyngeal swab, presence of viral mutation(s) within the areas targeted by this assay, and inadequate number of viral copies (<131 copies/mL). A negative result must be combined with clinical observations, patient history, and epidemiological information. The expected result is Negative. Fact Sheet for Patients:  https://www.moore.com/ Fact Sheet for Healthcare Providers:  https://www.young.biz/ This test is not yet ap proved or cleared by the [place] FDA and  has been authorized  for detection and/or diagnosis of SARS-CoV-2 by FDA under an Emergency Use Authorization (EUA). This EUA will remain  in effect (meaning this test can be used) for the duration of the COVID-19 declaration under Section 564(b)(1) of the Act, 21 U.S.C. section 360bbb-3(b)(1), unless the authorization is terminated or revoked sooner.    Influenza A by PCR NEGATIVE NEGATIVE Final   Influenza B by PCR NEGATIVE NEGATIVE Final    Comment: (NOTE) The Xpert Xpress SARS-CoV-2/FLU/RSV assay is intended as an aid in  the diagnosis of influenza from Nasopharyngeal swab specimens and  should not be used as a sole basis for treatment. Nasal washings and  aspirates are unacceptable for Xpert Xpress SARS-CoV-2/FLU/RSV  testing. Fact Sheet for Patients: https://www.moore.com/ Fact Sheet for Healthcare Providers: https://www.young.biz/ This test is not yet approved or cleared by the [place] FDA and  has been authorized for detection and/or diagnosis of SARS-CoV-2 by  FDA under an Emergency Use Authorization (EUA). This EUA will remain  in effect (meaning this test can be used) for the duration of the  Covid-19 declaration under Section 564(b)(1) of the Act, 21  U.S.C. section 360bbb-3(b)(1), unless the authorization is  terminated or revoked. Performed at [facility], [address] [place], [place] [address]   MRSA PCR Screening     Status: None   Collection Time: 08/30/19  8:32 PM   Specimen: Nasopharyngeal  Result Value Ref Range Status   MRSA by PCR NEGATIVE NEGATIVE Final    Comment:        The GeneXpert MRSA Assay (FDA approved for NASAL specimens only), is one component of a comprehensive MRSA colonization surveillance program. It is not intended to diagnose MRSA infection nor to guide or monitor treatment for MRSA infections. Performed at [facility], [address], [place], [place] [address]      Labs: BNP (last 3  results) No results for input(s): BNP in the last 8760 hours. Basic Metabolic Panel: Recent Labs  Lab 08/30/19 1432 08/30/19 1432 08/30/19 1501 08/30/19 2114 08/31/19 0536 09/01/19 0339 09/01/19 1926 09/02/19 0151  NA 140  --  140  --  142 140  --  139  K 5.9*   < > 5.8* 4.7 3.5 2.8*  --  3.3*  CL 101  --  102  --  106 107  --  106  CO2 22  --   --   --  26 27  --  28  GLUCOSE 134*  --  121*  --  111* 114*  --  120*  BUN 17  --  17  --  14 5*  --  <5*  CREATININE 1.16  --  1.10  --  1.01 0.77  --  0.80  CALCIUM 8.5*  --   --   --  7.8* 7.0*  --  7.5*  MG  --   --   --   --  1.1* 1.8  --   --   PHOS  --   --   --   --   --  1.4* 2.5  --    < > = values in this interval not displayed.   Liver Function Tests: Recent Labs  Lab 08/30/19 1432 08/31/19 0536 09/01/19 0339 09/02/19 0151  AST 288* 1,185* 837* 435*  ALT 96* 352* 319* 244*  ALKPHOS 66 48 45 51  BILITOT 4.7* 4.3* 1.9* 1.5*  PROT 6.6 5.4* 4.9* 5.2*  ALBUMIN 2.9* 2.8* 2.4* 2.4*   No results for input(s): LIPASE, AMYLASE in the last 168 hours. Recent Labs  Lab 08/30/19 1450  AMMONIA 47*   CBC: Recent Labs  Lab 08/30/19 1432 08/30/19 1501 08/31/19 0536 08/31/19 1042 08/31/19 1639 09/01/19 0339 09/01/19 1712  WBC 18.9*  --  18.6*  --   --  13.6*  --   NEUTROABS  --   --   --   --   --  10.7*  --   HGB 11.3*   < > 10.2* 9.8* 10.0* 9.2* 10.1*  HCT 33.7*   < > 29.5* 28.7* 29.0* 27.5* 30.8*  MCV 100.0  --  94.6  --   --  96.2  --   PLT 267  --  90*  --   --  75*  --    < > = values in this interval not displayed.   Cardiac Enzymes: No results for input(s): CKTOTAL, CKMB, CKMBINDEX, TROPONINI in the last 168 hours. BNP: Invalid input(s): POCBNP CBG: Recent Labs  Lab 08/30/19 2013 08/31/19 0715  GLUCAP 168* 108*   D-Dimer No results for input(s): DDIMER in the last 72 hours. Hgb A1c No results for input(s): HGBA1C in the last 72 hours. Lipid Profile No results for input(s): CHOL, HDL, LDLCALC,  TRIG, CHOLHDL, LDLDIRECT in the last 72 hours. Thyroid function studies No results for input(s): TSH, T4TOTAL, T3FREE, THYROIDAB in the last 72 hours.  Invalid input(s): FREET3 Anemia work up No results for input(s): VITAMINB12, FOLATE, FERRITIN, TIBC, IRON, RETICCTPCT in the last 72 hours. Urinalysis    Component Value Date/Time   COLORURINE YELLOW 07/17/2019 0735   APPEARANCEUR CLEAR 07/17/2019 0735   LABSPEC 1.011 07/17/2019 0735   PHURINE 8.0 07/17/2019 0735   GLUCOSEU NEGATIVE 07/17/2019 0735   HGBUR NEGATIVE 07/17/2019 0735   BILIRUBINUR NEGATIVE 07/17/2019 0735   KETONESUR NEGATIVE 07/17/2019 0735   PROTEINUR NEGATIVE 07/17/2019 0735   NITRITE NEGATIVE 07/17/2019 0735   LEUKOCYTESUR NEGATIVE 07/17/2019 0735   Sepsis Labs Invalid input(s): PROCALCITONIN,  WBC,  LACTICIDVEN Microbiology Recent Results (from the past 240 hour(s))  Blood culture (routine x 2)     Status: None (Preliminary result)   Collection Time: 08/30/19  2:50 PM   Specimen: BLOOD  Result Value Ref Range Status   Specimen Description BLOOD RIGHT ANTECUBITAL  Final   Special Requests   Final    BOTTLES DRAWN AEROBIC AND ANAEROBIC Blood Culture adequate volume   Culture   Final    NO GROWTH 3 DAYS Performed at  [facility], [address] [place], [place], [place] [address]    Report Status PENDING  Incomplete  Blood culture (routine x 2)     Status: None (Preliminary result)   Collection Time: 08/30/19  3:00 PM   Specimen: BLOOD LEFT ARM  Result Value Ref Range Status   Specimen Description BLOOD LEFT ARM  Final   Special Requests   Final    BOTTLES DRAWN AEROBIC AND ANAEROBIC Blood Culture adequate volume   Culture   Final    NO GROWTH 3 DAYS Performed at [facility], [address], [place], [place] [address]    Report Status PENDING  Incomplete  Respiratory Panel by RT PCR (Flu A&B, Covid) - Nasopharyngeal Swab     Status: None   Collection Time: 08/30/19  3:00 PM   Specimen:  Nasopharyngeal Swab  Result Value Ref Range Status   SARS Coronavirus 2 by RT PCR NEGATIVE NEGATIVE Final    Comment: (NOTE) SARS-CoV-2 target nucleic acids are NOT DETECTED. The SARS-CoV-2 RNA is generally detectable in upper respiratoy specimens during the acute phase of infection. The lowest concentration of SARS-CoV-2 viral copies this assay can detect is 131 copies/mL. A negative result does not preclude SARS-Cov-2 infection and should not be used as the sole basis for treatment or other patient management decisions. A negative result may occur with  improper specimen collection/handling, submission of specimen other than nasopharyngeal swab, presence of viral mutation(s) within the areas targeted by this assay, and inadequate number of viral copies (<131 copies/mL). A negative result must be combined with clinical observations, patient history, and epidemiological information. The expected result is Negative. Fact Sheet for Patients:  https://www.moore.com/ Fact Sheet for Healthcare Providers:  https://www.young.biz/ This test is not yet ap proved or cleared by the [place] FDA and  has been authorized for detection and/or diagnosis of SARS-CoV-2 by FDA under an Emergency Use Authorization (EUA). This EUA will remain  in effect (meaning this test can be used) for the duration of the COVID-19 declaration under Section 564(b)(1) of the Act, 21 U.S.C. section 360bbb-3(b)(1), unless the authorization is terminated or revoked sooner.    Influenza A by PCR NEGATIVE NEGATIVE Final   Influenza B by PCR NEGATIVE NEGATIVE Final    Comment: (NOTE) The Xpert Xpress SARS-CoV-2/FLU/RSV assay is intended as an aid in  the diagnosis of influenza from Nasopharyngeal swab specimens and  should not be used as a sole basis for treatment. Nasal washings and  aspirates are unacceptable for Xpert Xpress SARS-CoV-2/FLU/RSV  testing. Fact Sheet for  Patients: https://www.moore.com/ Fact Sheet for Healthcare Providers: https://www.young.biz/ This test is not yet approved or cleared by the [place] FDA and  has been authorized for detection and/or diagnosis of SARS-CoV-2 by  FDA under an Emergency Use Authorization (EUA). This EUA will remain  in effect (meaning this test can be used) for the duration of the  Covid-19 declaration under Section 564(b)(1) of the Act, 21  U.S.C. section 360bbb-3(b)(1), unless the authorization is  terminated or revoked. Performed at [facility], [address], [place], [place] [address]   MRSA PCR Screening     Status: None   Collection Time: 08/30/19  8:32 PM   Specimen: Nasopharyngeal  Result Value Ref Range Status   MRSA by PCR NEGATIVE NEGATIVE Final    Comment:        The GeneXpert MRSA Assay (FDA approved for NASAL specimens only), is one component of a comprehensive  MRSA colonization surveillance program. It is not intended to diagnose MRSA infection nor to guide or monitor treatment for MRSA infections. Performed at [facility], [place] [address], [place], [place] [address]      Time coordinating discharge: 40 minutes  SIGNED:   Elmarie Shiley, MD  [facility]

## 2019-09-02 NOTE — Progress Notes (Signed)
Subjective: Patient doing well. Has been having regular bowel movements without black stools or blood in stools. Denies abdominal pain, nausea vomiting. Denies difficulty swallowing or pain on swallowing.  Objective: Vital signs in last 24 hours: Temp:  [97.7 F (36.5 C)-98.9 F (37.2 C)] 98.6 F (37 C) (05/07 0541) Pulse Rate:  [73-91] 82 (05/07 0541) Resp:  [17-19] 19 (05/07 0541) BP: (119-134)/(83-89) 119/89 (05/07 0541) SpO2:  [96 %-100 %] 98 % (05/07 0541) Weight change:  Last BM Date: 09/02/19  PE: Sitting up comfortably on bed GENERAL: Mild pallor, mild icterus, alert, oriented x3, no asterixis ABDOMEN: Small umbilical hernia, otherwise soft, nondistended, nontender EXTREMITIES: No pedal edema, no deformity  Lab Results: Results for orders placed or performed during the hospital encounter of 08/30/19 (from the past 48 hour(s))  Provider-confirm verbal Blood Bank order - RBC, Type & Screen; 1 Unit; Order taken: 08/30/2019; 3:03 PM; Emergency Release 1 RBC UNIT TRANSFUSED     Status: None   Collection Time: 08/31/19  9:44 AM  Result Value Ref Range   Blood product order confirm      MD AUTHORIZATION REQUESTED Performed at [facility], [address] [place], [place] [address]   Hemoglobin and hematocrit, blood     Status: Abnormal   Collection Time: 08/31/19 10:42 AM  Result Value Ref Range   Hemoglobin 9.8 (L) 13.0 - 17.0 g/dL   HCT 28.7 (L) 39.0 - 52.0 %    Comment: Performed at [facility], [place] [address], [place], [place] [address]  Hemoglobin and hematocrit, blood     Status: Abnormal   Collection Time: 08/31/19  4:39 PM  Result Value Ref Range   Hemoglobin 10.0 (L) 13.0 - 17.0 g/dL   HCT 29.0 (L) 39.0 - 52.0 %    Comment: Performed at [facility] [address] [place], [place] [address]  Comprehensive metabolic panel     Status: Abnormal   Collection Time: 09/01/19  3:39 AM  Result Value Ref Range   Sodium 140 135 - 145 mmol/L   Potassium 2.8 (L) 3.5 - 5.1 mmol/L   Chloride 107 98 - 111 mmol/L   CO2 27 22 - 32 mmol/L   Glucose, Bld 114 (H) 70 - 99 mg/dL    Comment: Glucose reference range applies only to samples taken after fasting for at least 8 hours.   BUN 5 (L) 6 - 20 mg/dL   Creatinine, Ser 0.77 0.61 - 1.24 mg/dL   Calcium 7.0 (L) 8.9 - 10.3 mg/dL   Total Protein 4.9 (L) 6.5 - 8.1 g/dL   Albumin 2.4 (L) 3.5 - 5.0 g/dL   AST 837 (H) 15 - 41 U/L   ALT 319 (H) 0 - 44 U/L   Alkaline Phosphatase 45 38 - 126 U/L   Total Bilirubin 1.9 (H) 0.3 - 1.2 mg/dL   GFR calc non Af Amer >60 >60 mL/min   GFR calc Af Amer >60 >60 mL/min   Anion gap 6 5 - 15    Comment: Performed at [facility] [address], [place], [place] [address]  Magnesium     Status: None   Collection Time: 09/01/19  3:39 AM  Result Value Ref Range   Magnesium 1.8 1.7 - 2.4 mg/dL    Comment: Performed at [facility] [address], [place], [place] [address]  Phosphorus     Status: Abnormal   Collection Time: 09/01/19  3:39 AM  Result Value Ref Range   Phosphorus 1.4 (L) 2.5 - 4.6 mg/dL    Comment: Performed at [facility], [address], [place], [place] [address]  CBC with Differential/Platelet     Status: Abnormal   Collection Time: 09/01/19  3:39 AM  Result Value Ref Range   WBC 13.6 (H) 4.0 - 10.5 K/uL   RBC 2.86 (L) 4.22 - 5.81 MIL/uL   Hemoglobin 9.2 (L) 13.0 - 17.0 g/dL   HCT 60.4 (L) 54.0 - 98.1 %   MCV 96.2 80.0 - 100.0 fL   MCH 32.2 26.0 - 34.0 pg   MCHC 33.5 30.0 - 36.0 g/dL   RDW 19.1 (H) 47.8 - 29.5 %   Platelets 75 (L) 150 - 400 K/uL    Comment: SPECIMEN CHECKED FOR CLOTS CONSISTENT WITH PREVIOUS RESULT Immature Platelet Fraction may be clinically indicated, consider ordering this additional test [id number] REPEATED TO VERIFY    nRBC 0.3 (H) 0.0 - 0.2 %   Neutrophils Relative % 78 %   Neutro Abs 10.7 (H) 1.7 - 7.7 K/uL   Lymphocytes Relative 8 %   Lymphs Abs 1.1 0.7 - 4.0 K/uL   Monocytes  Relative 10 %   Monocytes Absolute 1.3 (H) 0.1 - 1.0 K/uL   Eosinophils Relative 2 %   Eosinophils Absolute 0.2 0.0 - 0.5 K/uL   Basophils Relative 1 %   Basophils Absolute 0.1 0.0 - 0.1 K/uL   Immature Granulocytes 1 %   Abs Immature Granulocytes 0.15 (H) 0.00 - 0.07 K/uL    Comment: Performed at [facility], [address], [place], [place] [address]  Protime-INR     Status: Abnormal   Collection Time: 09/01/19  8:05 AM  Result Value Ref Range   Prothrombin Time 21.1 (H) 11.4 - 15.2 seconds   INR 1.9 (H) 0.8 - 1.2    Comment: (NOTE) INR goal varies based on device and disease states. Performed at [facility], [address] [place], [place] [address]   Hemoglobin and hematocrit, blood     Status: Abnormal   Collection Time: 09/01/19  5:12 PM  Result Value Ref Range   Hemoglobin 10.1 (L) 13.0 - 17.0 g/dL   HCT 84.1 (L) 32.4 - 40.1 %    Comment: Performed at [facility], [address], [place], [place] [address]  phosphorus level     Status: None   Collection Time: 09/01/19  7:26 PM  Result Value Ref Range   Phosphorus 2.5 2.5 - 4.6 mg/dL    Comment: Performed at [facility], [address] [place], [place] [address]  Comprehensive metabolic panel     Status: Abnormal   Collection Time: 09/02/19  1:51 AM  Result Value Ref Range   Sodium 139 135 - 145 mmol/L   Potassium 3.3 (L) 3.5 - 5.1 mmol/L   Chloride 106 98 - 111 mmol/L   CO2 28 22 - 32 mmol/L   Glucose, Bld 120 (H) 70 - 99 mg/dL    Comment: Glucose reference range applies only to samples taken after fasting for at least 8 hours.   BUN <5 (L) 6 - 20 mg/dL   Creatinine, Ser 0.34 0.61 - 1.24 mg/dL   Calcium 7.5 (L) 8.9 - 10.3 mg/dL   Total Protein 5.2 (L) 6.5 - 8.1 g/dL   Albumin 2.4 (L) 3.5 - 5.0 g/dL   AST 742 (H) 15 - 41 U/L   ALT 244 (H)  0 - 44 U/L   Alkaline Phosphatase 51 38 - 126 U/L   Total Bilirubin 1.5 (H) 0.3 - 1.2 mg/dL   GFR calc non Af Amer >60 >60 mL/min   GFR calc Af  Amer >60 >60 mL/min   Anion gap 5 5 - 15    Comment: Performed at [facility], [address] [place], [place] [address]    Studies/Results: No results found.  Medications: I have reviewed the patient's current medications.  Assessment: Hematemesis secondary to esophageal varices from alcohol related cirrhosis Status post EGD and banding Hemoglobin remained stable at 10.1 Octreotide has been discontinued and patient is on nadolol 20 mg a day-blood pressure 119/89 and heart rate of 75  Liver enzymes improving, T bili/AST/ALT/ALP of 1.5/435/244/51(likely had a component of ischemic hepatitis which seems to be improving)  Mild hypokalemia, potassium 3.3, normal renal function PT/INR 21.1/1.9  Plan: Okay to DC home on nadolol 20 mg a day. Currently patient has no signs of hepatic encephalopathy but recommend continuing lactulose as an outpatient for history of hepatic encephalopathy. Okay to DC IV antibiotics on discharge. Patient is on furosemide as an outpatient, could not tolerate spironolactone due to gynecomastia. Patient to follow-up with [facility] liver clinic on discharge. He will likely need repeated EGDs every 2 to 3 months for banding of esophageal varices as an outpatient, until esophageal varices are completely obliterated. Discussed the same with the patient in details, he verbalized understanding. Recommend abstinence from alcohol.   Kerin Salen, MD 09/02/2019, 8:52 AM

## 2019-09-02 NOTE — Social Work (Signed)
CSW met with pt and pt mother briefly before discharge. CSW introduced self, role, reason for visit. Pt from home with supportive family. Pt agreeable to mother being in room while we spoke. Confirmed home address and PCP. Pt has been followed for his medical care and ETOH treatment in [facility], is aware of resources available in community. He accepted packet of local resources before he left.   Westley Hummer, MSW, [place] [facility]

## 2019-09-04 LAB — CULTURE, BLOOD (ROUTINE X 2)
Culture: NO GROWTH
Culture: NO GROWTH
Special Requests: ADEQUATE
Special Requests: ADEQUATE

## 2019-10-09 ENCOUNTER — Other Ambulatory Visit: Payer: Self-pay

## 2019-10-09 ENCOUNTER — Observation Stay (HOSPITAL_COMMUNITY): Payer: Self-pay

## 2019-10-09 ENCOUNTER — Encounter (HOSPITAL_COMMUNITY): Payer: Self-pay | Admitting: Emergency Medicine

## 2019-10-09 ENCOUNTER — Inpatient Hospital Stay (HOSPITAL_COMMUNITY)
Admission: EM | Admit: 2019-10-09 | Discharge: 2019-10-11 | DRG: 432 | Disposition: A | Discharge status: Home / Self Care | Payer: Self-pay | Attending: Family Medicine | Admitting: Family Medicine

## 2019-10-09 DIAGNOSIS — F10930 Alcohol use, unspecified with withdrawal, uncomplicated: Secondary | ICD-10-CM

## 2019-10-09 DIAGNOSIS — K922 Gastrointestinal hemorrhage, unspecified: Secondary | ICD-10-CM | POA: Diagnosis present

## 2019-10-09 DIAGNOSIS — R748 Abnormal levels of other serum enzymes: Secondary | ICD-10-CM | POA: Diagnosis present

## 2019-10-09 DIAGNOSIS — Z880 Allergy status to penicillin: Secondary | ICD-10-CM

## 2019-10-09 DIAGNOSIS — K219 Gastro-esophageal reflux disease without esophagitis: Secondary | ICD-10-CM | POA: Diagnosis present

## 2019-10-09 DIAGNOSIS — E872 Acidosis, unspecified: Secondary | ICD-10-CM | POA: Diagnosis present

## 2019-10-09 DIAGNOSIS — Z8249 Family history of ischemic heart disease and other diseases of the circulatory system: Secondary | ICD-10-CM

## 2019-10-09 DIAGNOSIS — F1010 Alcohol abuse, uncomplicated: Secondary | ICD-10-CM | POA: Diagnosis present

## 2019-10-09 DIAGNOSIS — Z79899 Other long term (current) drug therapy: Secondary | ICD-10-CM

## 2019-10-09 DIAGNOSIS — Z87891 Personal history of nicotine dependence: Secondary | ICD-10-CM

## 2019-10-09 DIAGNOSIS — K920 Hematemesis: Secondary | ICD-10-CM

## 2019-10-09 DIAGNOSIS — K766 Portal hypertension: Secondary | ICD-10-CM | POA: Diagnosis present

## 2019-10-09 DIAGNOSIS — Z823 Family history of stroke: Secondary | ICD-10-CM

## 2019-10-09 DIAGNOSIS — Y903 Blood alcohol level of 60-79 mg/100 ml: Secondary | ICD-10-CM | POA: Diagnosis present

## 2019-10-09 DIAGNOSIS — R578 Other shock: Secondary | ICD-10-CM | POA: Diagnosis present

## 2019-10-09 DIAGNOSIS — I8501 Esophageal varices with bleeding: Secondary | ICD-10-CM

## 2019-10-09 DIAGNOSIS — K7010 Alcoholic hepatitis without ascites: Secondary | ICD-10-CM | POA: Diagnosis present

## 2019-10-09 DIAGNOSIS — I8511 Secondary esophageal varices with bleeding: Secondary | ICD-10-CM | POA: Diagnosis present

## 2019-10-09 DIAGNOSIS — D72829 Elevated white blood cell count, unspecified: Secondary | ICD-10-CM | POA: Diagnosis present

## 2019-10-09 DIAGNOSIS — D649 Anemia, unspecified: Secondary | ICD-10-CM | POA: Diagnosis present

## 2019-10-09 DIAGNOSIS — Z20822 Contact with and (suspected) exposure to covid-19: Secondary | ICD-10-CM | POA: Diagnosis present

## 2019-10-09 DIAGNOSIS — K3189 Other diseases of stomach and duodenum: Secondary | ICD-10-CM | POA: Diagnosis present

## 2019-10-09 DIAGNOSIS — K7030 Alcoholic cirrhosis of liver without ascites: Principal | ICD-10-CM | POA: Diagnosis present

## 2019-10-09 LAB — URINALYSIS, ROUTINE W REFLEX MICROSCOPIC
Bacteria, UA: NONE SEEN
Bilirubin Urine: NEGATIVE
Glucose, UA: NEGATIVE mg/dL
Hgb urine dipstick: NEGATIVE
Ketones, ur: 20 mg/dL — AB
Leukocytes,Ua: NEGATIVE
Nitrite: NEGATIVE
Protein, ur: 30 mg/dL — AB
Specific Gravity, Urine: 1.028 (ref 1.005–1.030)
pH: 6 (ref 5.0–8.0)

## 2019-10-09 LAB — HEMOGLOBIN AND HEMATOCRIT, BLOOD
HCT: 33.8 % — ABNORMAL LOW (ref 39.0–52.0)
Hemoglobin: 11.1 g/dL — ABNORMAL LOW (ref 13.0–17.0)

## 2019-10-09 LAB — PROTIME-INR
INR: 1.3 — ABNORMAL HIGH (ref 0.8–1.2)
Prothrombin Time: 15.8 seconds — ABNORMAL HIGH (ref 11.4–15.2)

## 2019-10-09 LAB — CBC WITH DIFFERENTIAL/PLATELET
Abs Immature Granulocytes: 0.08 10*3/uL — ABNORMAL HIGH (ref 0.00–0.07)
Basophils Absolute: 0.2 10*3/uL — ABNORMAL HIGH (ref 0.0–0.1)
Basophils Relative: 1 %
Eosinophils Absolute: 0 10*3/uL (ref 0.0–0.5)
Eosinophils Relative: 0 %
HCT: 39.6 % (ref 39.0–52.0)
Hemoglobin: 13.2 g/dL (ref 13.0–17.0)
Immature Granulocytes: 1 %
Lymphocytes Relative: 9 %
Lymphs Abs: 1.1 10*3/uL (ref 0.7–4.0)
MCH: 31.5 pg (ref 26.0–34.0)
MCHC: 33.3 g/dL (ref 30.0–36.0)
MCV: 94.5 fL (ref 80.0–100.0)
Monocytes Absolute: 1.1 10*3/uL — ABNORMAL HIGH (ref 0.1–1.0)
Monocytes Relative: 9 %
Neutro Abs: 9.7 10*3/uL — ABNORMAL HIGH (ref 1.7–7.7)
Neutrophils Relative %: 80 %
Platelets: 294 10*3/uL (ref 150–400)
RBC: 4.19 MIL/uL — ABNORMAL LOW (ref 4.22–5.81)
RDW: 14.3 % (ref 11.5–15.5)
WBC: 12.2 10*3/uL — ABNORMAL HIGH (ref 4.0–10.5)
nRBC: 0 % (ref 0.0–0.2)

## 2019-10-09 LAB — COMPREHENSIVE METABOLIC PANEL
ALT: 19 U/L (ref 0–44)
AST: 43 U/L — ABNORMAL HIGH (ref 15–41)
Albumin: 3.6 g/dL (ref 3.5–5.0)
Alkaline Phosphatase: 49 U/L (ref 38–126)
Anion gap: 18 — ABNORMAL HIGH (ref 5–15)
BUN: 16 mg/dL (ref 6–20)
CO2: 22 mmol/L (ref 22–32)
Calcium: 8.9 mg/dL (ref 8.9–10.3)
Chloride: 99 mmol/L (ref 98–111)
Creatinine, Ser: 0.81 mg/dL (ref 0.61–1.24)
GFR calc Af Amer: >60 mL/min (ref >60)
GFR calc non Af Amer: >60 mL/min (ref >60)
Glucose, Bld: 151 mg/dL — ABNORMAL HIGH (ref 70–99)
Potassium: 3.7 mmol/L (ref 3.5–5.1)
Sodium: 139 mmol/L (ref 135–145)
Total Bilirubin: 1.3 mg/dL — ABNORMAL HIGH (ref 0.3–1.2)
Total Protein: 7.6 g/dL (ref 6.5–8.1)

## 2019-10-09 LAB — ETHANOL: Alcohol, Ethyl (B): 69 mg/dL — ABNORMAL HIGH (ref <10)

## 2019-10-09 LAB — AMMONIA: Ammonia: 16 umol/L (ref 9–35)

## 2019-10-09 LAB — SAMPLE TO BLOOD BANK

## 2019-10-09 LAB — TYPE AND SCREEN
ABO/RH(D): O NEG
Antibody Screen: NEGATIVE

## 2019-10-09 LAB — SARS CORONAVIRUS 2 BY RT PCR (HOSPITAL ORDER, PERFORMED IN ~~LOC~~ HOSPITAL LAB): SARS Coronavirus 2: NEGATIVE

## 2019-10-09 LAB — LACTIC ACID, PLASMA: Lactic Acid, Venous: 3.8 mmol/L (ref 0.5–1.9)

## 2019-10-09 LAB — LIPASE, BLOOD: Lipase: 23 U/L (ref 11–51)

## 2019-10-09 LAB — POC OCCULT BLOOD, ED: Fecal Occult Bld: POSITIVE — AB

## 2019-10-09 LAB — PHOSPHORUS: Phosphorus: 2 mg/dL — ABNORMAL LOW (ref 2.5–4.6)

## 2019-10-09 LAB — MAGNESIUM: Magnesium: 1.1 mg/dL — ABNORMAL LOW (ref 1.7–2.4)

## 2019-10-09 MED ORDER — ACETAMINOPHEN 325 MG PO TABS: 650.0000 mg | ORAL_TABLET | Freq: Four times a day (QID) | ORAL | Status: DC | PRN

## 2019-10-09 MED ORDER — ONDANSETRON HCL 4 MG/2ML IJ SOLN: 4.0000 mg | Freq: Four times a day (QID) | INTRAMUSCULAR | Status: DC | PRN

## 2019-10-09 MED ORDER — SODIUM CHLORIDE 0.9 % IV SOLN
8.0000 mg/h | INTRAVENOUS | Status: DC
  Administered 2019-10-09 – 2019-10-10 (×3): 8 mg/h via INTRAVENOUS
  Filled 2019-10-09 (×3): qty 80

## 2019-10-09 MED ORDER — THIAMINE HCL 100 MG/ML IJ SOLN
100.0000 mg | Freq: Every day | INTRAMUSCULAR | Status: DC
  Filled 2019-10-09: qty 2

## 2019-10-09 MED ORDER — SODIUM CHLORIDE 0.9 % IV SOLN
2.0000 g | INTRAVENOUS | Status: DC
  Administered 2019-10-09 – 2019-10-11 (×3): 2 g via INTRAVENOUS
  Filled 2019-10-09 (×3): qty 20

## 2019-10-09 MED ORDER — PANTOPRAZOLE SODIUM 40 MG IV SOLR
40.0000 mg | Freq: Once | INTRAVENOUS | Status: AC
  Administered 2019-10-09: 40 mg via INTRAVENOUS
  Filled 2019-10-09: qty 40

## 2019-10-09 MED ORDER — LORAZEPAM 2 MG/ML IJ SOLN: 1.0000 mg | INTRAMUSCULAR | Status: DC | PRN

## 2019-10-09 MED ORDER — ONDANSETRON HCL 4 MG PO TABS: 4.0000 mg | ORAL_TABLET | Freq: Four times a day (QID) | ORAL | Status: DC | PRN

## 2019-10-09 MED ORDER — FOLIC ACID 1 MG PO TABS
1.0000 mg | ORAL_TABLET | Freq: Every day | ORAL | Status: DC
  Administered 2019-10-09 – 2019-10-11 (×2): 1 mg via ORAL
  Filled 2019-10-09 (×4): qty 1

## 2019-10-09 MED ORDER — THIAMINE HCL 100 MG PO TABS
100.0000 mg | ORAL_TABLET | Freq: Every day | ORAL | Status: DC
  Administered 2019-10-09 – 2019-10-11 (×2): 100 mg via ORAL
  Filled 2019-10-09 (×4): qty 1

## 2019-10-09 MED ORDER — ALBUTEROL SULFATE (2.5 MG/3ML) 0.083% IN NEBU: 2.5000 mg | INHALATION_SOLUTION | Freq: Four times a day (QID) | RESPIRATORY_TRACT | Status: DC | PRN

## 2019-10-09 MED ORDER — LORAZEPAM 1 MG PO TABS: 1.0000 mg | ORAL_TABLET | ORAL | Status: DC | PRN

## 2019-10-09 MED ORDER — ONDANSETRON HCL 4 MG/2ML IJ SOLN
4.0000 mg | Freq: Once | INTRAMUSCULAR | Status: AC
  Administered 2019-10-09: 4 mg via INTRAVENOUS
  Filled 2019-10-09: qty 2

## 2019-10-09 MED ORDER — LORAZEPAM 1 MG PO TABS: 0.0000 mg | ORAL_TABLET | Freq: Two times a day (BID) | ORAL | Status: DC

## 2019-10-09 MED ORDER — LORAZEPAM 2 MG/ML IJ SOLN: 0.0000 mg | Freq: Two times a day (BID) | INTRAMUSCULAR | Status: DC

## 2019-10-09 MED ORDER — PANTOPRAZOLE SODIUM 40 MG IV SOLR: 40.0000 mg | Freq: Two times a day (BID) | INTRAVENOUS | Status: DC

## 2019-10-09 MED ORDER — LORAZEPAM 2 MG/ML IJ SOLN
0.0000 mg | Freq: Four times a day (QID) | INTRAMUSCULAR | Status: AC
  Administered 2019-10-09: 2 mg via INTRAVENOUS
  Filled 2019-10-09: qty 1

## 2019-10-09 MED ORDER — SODIUM CHLORIDE 0.9% FLUSH
3.0000 mL | Freq: Two times a day (BID) | INTRAVENOUS | Status: DC
  Administered 2019-10-09 – 2019-10-11 (×2): 3 mL via INTRAVENOUS

## 2019-10-09 MED ORDER — SODIUM CHLORIDE 0.9 % IV BOLUS
1000.0000 mL | Freq: Once | INTRAVENOUS | Status: AC
  Administered 2019-10-09: 1000 mL via INTRAVENOUS

## 2019-10-09 MED ORDER — SODIUM CHLORIDE 0.9 % IV SOLN
50.0000 ug/h | INTRAVENOUS | Status: DC
  Administered 2019-10-09 – 2019-10-11 (×5): 50 ug/h via INTRAVENOUS
  Filled 2019-10-09 (×6): qty 1

## 2019-10-09 MED ORDER — LORAZEPAM 1 MG PO TABS: 0.0000 mg | ORAL_TABLET | Freq: Four times a day (QID) | ORAL | Status: AC

## 2019-10-09 MED ORDER — ADULT MULTIVITAMIN W/MINERALS CH
1.0000 | ORAL_TABLET | Freq: Every day | ORAL | Status: DC
  Administered 2019-10-09 – 2019-10-11 (×2): 1 via ORAL
  Filled 2019-10-09 (×4): qty 1

## 2019-10-09 MED ORDER — ACETAMINOPHEN 650 MG RE SUPP: 650.0000 mg | Freq: Four times a day (QID) | RECTAL | Status: DC | PRN

## 2019-10-09 NOTE — Consult Note (Signed)
Referring Provider:  [facility]  Primary Care Physician:  Farris Has, MD Primary Gastroenterologist: Gentry Fitz  Reason for Consultation: Hematemesis, in the setting of esophageal varices  HPI: [name] is a very pleasant, cooperative 39 y.o. male who has been dismissed from our practice but is being seen as an unassigned patient.  He has a history of ongoing ethanol abuse, with his first variceal bleed in 2017 when he was scoped by Dr. Doy Mince, who was unable to apply bands but did sclerotherapy with ethanolamine, and more recently, of variceal bleed on Aug 30, 2019 when he was scoped by Dr. Marca Ancona and had banding of very large pan-esophageal varices which had stigmata of recent hemorrhage.  He also had portal gastropathy and questionable gastric varices.  The patient has ongoing ethanol consumption and is therefore not considered a candidate for liver transplant but the idea of TIPS had been brought up in the past.  With all that background, the patient consumes daily alcohol and had a single episode of large-volume hematemesis last night, associated with a dark stool around that time, but has not had any further bleeding from either and since then.  He was never syncopal or presyncopal.  No chest pain or dyspnea.  No focal neurologic symptoms.  No antecedent exposure to aspirin or NSAIDs.  In the emergency room, the patient had melenic stool on rectal exam, Hemoccult positive, and his hemoglobin was 13.2 as compared to a discharge hemoglobin of 10.1 approximately 6 weeks earlier.  Interestingly, platelets are normal, as is the patient's albumin of 3.6 prior to hydration.  Liver chemistries are essentially normal with an AST of 43 and ALT of 19 (they were elevated in the 250-400 range 6 weeks ago), and bilirubin is improved at 1.3 whereas previously it was 1.5.  Lipase is normal.  He has been hemodynamically stable.   Past Medical History:  Diagnosis Date  . Cirrhosis of liver (HCC)    . Esophageal varices with hemorrhage (HCC)   . ETOH abuse   . GERD (gastroesophageal reflux disease)     Past Surgical History:  Procedure Laterality Date  . ESOPHAGEAL BANDING  08/30/2019   Procedure: ESOPHAGEAL BANDING;  Surgeon: Kerin Salen, MD;  Location: [facility] ENDOSCOPY;  Service: Gastroenterology;;  . ESOPHAGOGASTRODUODENOSCOPY Left 08/21/2015   Procedure: ESOPHAGOGASTRODUODENOSCOPY (EGD);  Surgeon: Charlott Rakes, MD;  Location: [facility] ENDOSCOPY;  Service: Endoscopy;  Laterality: Left;  . ESOPHAGOGASTRODUODENOSCOPY (EGD) WITH PROPOFOL N/A 08/30/2019   Procedure: ESOPHAGOGASTRODUODENOSCOPY (EGD) WITH PROPOFOL;  Surgeon: Kerin Salen, MD;  Location: [facility] ENDOSCOPY;  Service: Gastroenterology;  Laterality: N/A;    Prior to Admission medications   Medication Sig Start Date End Date Taking? Authorizing Provider  Ferrous Sulfate (IRON) 325 (65 Fe) MG TABS Take 1 tablet (325 mg total) by mouth daily. 09/02/19  Yes Regalado, Belkys A, MD  lactulose (CHRONULAC) 10 GM/15ML solution Take 45 mLs (30 g total) by mouth 2 (two) times daily. 09/02/19  Yes Regalado, Belkys A, MD  magnesium oxide (MAG-OX) 400 (241.3 Mg) MG tablet Take 1 tablet (400 mg total) by mouth 2 (two) times daily. 09/02/19  Yes Regalado, Belkys A, MD  Multiple Vitamin (MULTIVITAMIN WITH MINERALS) TABS tablet Take 1 tablet by mouth daily. 07/19/19  Yes Johnson, Clanford L, MD  nadolol (CORGARD) 20 MG tablet Take 1 tablet (20 mg total) by mouth daily. 09/02/19  Yes Regalado, Belkys A, MD  omeprazole (PRILOSEC) 20 MG capsule Take 1 capsule (20 mg total) by mouth daily. 09/02/19  Yes Regalado,  Belkys A, MD  potassium chloride SA (KLOR-CON) 20 MEQ tablet Take 1 tablet (20 mEq total) by mouth daily. 09/02/19  Yes Regalado, Belkys A, MD  sertraline (ZOLOFT) 100 MG tablet Take 1 tablet (100 mg total) by mouth daily. 09/02/19  Yes Regalado, Prentiss Bells, MD    Current Facility-Administered Medications  Medication Dose Route Frequency Provider Last Rate Last Admin   . acetaminophen (TYLENOL) tablet 650 mg  650 mg Oral Q6H PRN Clydie Braun, MD       Or  . acetaminophen (TYLENOL) suppository 650 mg  650 mg Rectal Q6H PRN Smith, Rondell A, MD      . albuterol (PROVENTIL) (2.5 MG/3ML) 0.083% nebulizer solution 2.5 mg  2.5 mg Nebulization Q6H PRN Smith, Rondell A, MD      . cefTRIAXone (ROCEPHIN) 2 g in sodium chloride 0.9 % 100 mL IVPB  2 g Intravenous Q24H Smith, Rondell A, MD 200 mL/hr at 10/09/19 1113 2 g at 10/09/19 1113  . folic acid (FOLVITE) tablet 1 mg  1 mg Oral Daily Smith, Rondell A, MD      . LORazepam (ATIVAN) injection 0-4 mg  0-4 mg Intravenous Q6H Venter, Margaux, PA-C   2 mg at 10/09/19 6754   Or  . LORazepam (ATIVAN) tablet 0-4 mg  0-4 mg Oral Q6H Venter, Margaux, PA-C      . [START ON 10/11/2019] LORazepam (ATIVAN) injection 0-4 mg  0-4 mg Intravenous Q12H Venter, Margaux, PA-C       Or  . Melene Muller ON 10/11/2019] LORazepam (ATIVAN) tablet 0-4 mg  0-4 mg Oral Q12H Venter, Margaux, PA-C      . LORazepam (ATIVAN) tablet 1-4 mg  1-4 mg Oral Q1H PRN Clydie Braun, MD       Or  . LORazepam (ATIVAN) injection 1-4 mg  1-4 mg Intravenous Q1H PRN Madelyn Flavors A, MD      . multivitamin with minerals tablet 1 tablet  1 tablet Oral Daily Madelyn Flavors A, MD   1 tablet at 10/09/19 1104  . octreotide (SANDOSTATIN) 500 mcg in sodium chloride 0.9 % 250 mL (2 mcg/mL) infusion  50 mcg/hr Intravenous Continuous Venter, Margaux, PA-C      . ondansetron (ZOFRAN) tablet 4 mg  4 mg Oral Q6H PRN Madelyn Flavors A, MD       Or  . ondansetron (ZOFRAN) injection 4 mg  4 mg Intravenous Q6H PRN Smith, Rondell A, MD      . pantoprazole (PROTONIX) 80 mg in sodium chloride 0.9 % 100 mL (0.8 mg/mL) infusion  8 mg/hr Intravenous Continuous Madelyn Flavors A, MD      . Melene Muller ON 10/12/2019] pantoprazole (PROTONIX) injection 40 mg  40 mg Intravenous Q12H Smith, Rondell A, MD      . sodium chloride flush (NS) 0.9 % injection 3 mL  3 mL Intravenous Q12H Smith, Rondell A, MD    3 mL at 10/09/19 1113  . thiamine tablet 100 mg  100 mg Oral Daily Hyman Hopes, Margaux, PA-C   100 mg at 10/09/19 1104   Or  . thiamine (B-1) injection 100 mg  100 mg Intravenous Daily Tanda Rockers, PA-C       Current Outpatient Medications  Medication Sig Dispense Refill  . Ferrous Sulfate (IRON) 325 (65 Fe) MG TABS Take 1 tablet (325 mg total) by mouth daily. 20 tablet 0  . lactulose (CHRONULAC) 10 GM/15ML solution Take 45 mLs (30 g total) by mouth 2 (two) times daily. 240 mL 0  .  magnesium oxide (MAG-OX) 400 (241.3 Mg) MG tablet Take 1 tablet (400 mg total) by mouth 2 (two) times daily. 30 tablet 0  . Multiple Vitamin (MULTIVITAMIN WITH MINERALS) TABS tablet Take 1 tablet by mouth daily.    . nadolol (CORGARD) 20 MG tablet Take 1 tablet (20 mg total) by mouth daily. 30 tablet 0  . omeprazole (PRILOSEC) 20 MG capsule Take 1 capsule (20 mg total) by mouth daily. 30 capsule 0  . potassium chloride SA (KLOR-CON) 20 MEQ tablet Take 1 tablet (20 mEq total) by mouth daily. 5 tablet 0  . sertraline (ZOLOFT) 100 MG tablet Take 1 tablet (100 mg total) by mouth daily. 30 tablet 0    Allergies as of 10/09/2019 - Review Complete 10/09/2019  Allergen Reaction Noted  . Penicillins Hives 05/26/2018    Family History  Problem Relation Age of Onset  . Peptic Ulcer Paternal Aunt   . Crohn's disease Maternal Uncle   . Heart attack Maternal Uncle   . Heart attack Maternal Grandfather   . Stroke Maternal Grandmother   . Stroke Paternal Grandfather     Social History   Socioeconomic History  . Marital status: Single    Spouse name: Not on file  . Number of children: Not on file  . Years of education: Not on file  . Highest education level: Not on file  Occupational History  . Not on file  Tobacco Use  . Smoking status: Former Smoker    Packs/day: 1.00    Types: Cigarettes  . Smokeless tobacco: Never Used  Vaping Use  . Vaping Use: Never used  Substance and Sexual Activity  . Alcohol  use: Yes    Comment: Alcoholism  . Drug use: Yes    Types: Marijuana  . Sexual activity: Not on file  Other Topics Concern  . Not on file  Social History Narrative  . Not on file   Social Determinants of Health   Financial Resource Strain:   . Difficulty of Paying Living Expenses:   Food Insecurity:   . Worried About Programme researcher, broadcasting/film/video in the Last Year:   . Barista in the Last Year:   Transportation Needs:   . Freight forwarder (Medical):   Marland Kitchen Lack of Transportation (Non-Medical):   Physical Activity:   . Days of Exercise per Week:   . Minutes of Exercise per Session:   Stress:   . Feeling of Stress :   Social Connections:   . Frequency of Communication with Friends and Family:   . Frequency of Social Gatherings with Friends and Family:   . Attends Religious Services:   . Active Member of Clubs or Organizations:   . Attends Banker Meetings:   Marland Kitchen Marital Status:   Intimate Partner Violence:   . Fear of Current or Ex-Partner:   . Emotionally Abused:   Marland Kitchen Physically Abused:   . Sexually Abused:     Review of Systems: Negative for peripheral problems such as skin rashes, joint pains, dyspnea, chest pain, or focal neurologic symptoms  Physical Exam: Vital signs in last 24 hours: Temp:  [97.7 F (36.5 C)-97.9 F (36.6 C)] 97.8 F (36.6 C) (06/13 0853) Pulse Rate:  [85-100] 95 (06/13 1015) Resp:  [16-26] 19 (06/13 1015) BP: (99-129)/(65-87) 118/87 (06/13 1015) SpO2:  [98 %-100 %] 99 % (06/13 1015) Weight:  [65 kg] 65 kg (06/13 0254)   General:   Alert,  Well-developed, well-nourished, pleasant and cooperative in  NAD.  He does not appear to be in active withdrawal at this time although there were concerns of that expressed by the ER physician. Head:  Normocephalic and atraumatic. Eyes:  Sclera clear, no icterus.   Conjunctiva pink. Mouth:   No ulcerations or lesions.  Oropharynx pink & moist. Lungs:  Clear throughout to auscultation.   No  wheezes, crackles, or rhonchi. No evident respiratory distress. Heart:   Slightly rapid rate with regular rhythm; no murmurs, clicks, rubs,  or gallops. Abdomen:  Soft, nontender, nontympanitic, and nondistended. No masses, hepatosplenomegaly or ventral hernias noted.  Flank tympany is present, suggesting the absence of ascites. Rectal: Per EDP, melenic stool, heme positive Msk:   Symmetrical without gross deformities. Pulses:  Normal radial pulse is noted. Extremities:   Without clubbing, cyanosis, or edema. Neurologic:  Alert and coherent;  grossly normal neurologically.  No asterixis Skin:  Intact without significant lesions or rashes. Psych:   Alert and cooperative. Normal mood and affect.  Intake/Output from previous day: No intake/output data recorded. Intake/Output this shift: No intake/output data recorded.  Lab Results: Recent Labs    10/09/19 0303  WBC 12.2*  HGB 13.2  HCT 39.6  PLT 294   BMET Recent Labs    10/09/19 0303  NA 139  K 3.7  CL 99  CO2 22  GLUCOSE 151*  BUN 16  CREATININE 0.81  CALCIUM 8.9   LFT Recent Labs    10/09/19 0303  PROT 7.6  ALBUMIN 3.6  AST 43*  ALT 19  ALKPHOS 49  BILITOT 1.3*   PT/INR Recent Labs    10/09/19 0303  LABPROT 15.8*  INR 1.3*    Studies/Results: DG CHEST PORT 1 VIEW  Result Date: 10/09/2019 CLINICAL DATA:  Hematemesis EXAM: PORTABLE CHEST 1 VIEW COMPARISON:  08/30/2019 FINDINGS: Normal heart size and mediastinal contours. No acute infiltrate or edema. No effusion or pneumothorax. No acute osseous findings. Artifact from EKG leads IMPRESSION: Negative portable chest. Electronically Signed   By: Monte Fantasia M.D.   On: 10/09/2019 10:26    Impression: 1.  Hematemesis, without posthemorrhagic anemia so far, or hemodynamic instability 2.  History of esophageal varices with recurrent hemorrhage in the past, most recently 6 weeks ago at which time banding was performed.  Also history of portal gastropathy and  possible gastric varices. 3.  Alcohol use disorder, active 4.  Previous history of ascites and previously on diuretic therapy, but currently without clinical evidence of ascites without diuretic therapy  Plan: 1.  Octreotide infusion 2.  Pantoprazole by infusion or bolus 3.  Probable endoscopic evaluation in the next 1 to 2 days.  Given the patient's absence of further bleeding over the past 12 hours, his hemodynamic stability, and his normal hemoglobin, I do not see a need to do it emergently today.  However, we are on standby for urgent endoscopy if his clinical condition changes significantly. 4.  Since there is no overt ascites by abdominal exam, I do not think concurrent antibiotic therapy is necessary 5.  We will follow with you.    LOS: 0 days   Youlanda Mighty Kailie Polus  10/09/2019, 1:07 PM   Pager [phone] If no answer or after 5 PM call [phone]

## 2019-10-09 NOTE — H&P (Addendum)
History and Physical    [name] BJS:[id number] DOB: 1980-09-19 DOA: 10/09/2019  Referring MD/NP/PA: Eustaquio Maize, PA-C PCP: James Pepper, MD  Patient coming from: Home via EMS  Chief Complaint: Vomiting blood  I have personally briefly reviewed patient's old medical records in [place]   HPI: [name] is a 39 y.o. male with medical history significant of alcohol abuse, alcohol-related cirrhosis with esophageal varices, alcoholic hepatitis, and GI bleed previously due to varices presents with complaints of vomiting blood around midnight.  He had been drinking half a pint of vodka per day over the last 3 days or so and had a mixed drink prior to onset of symptoms.  He reports filling up two emesis bags of dark blood with clots in it.  Associated symptoms included with, chills, and 1 dark black tarry stool.  Denied having any chest pain, shortness of breath, fever, or abdominal pain.   Last hospitalized from 5/4-5/7 for upper GI bleed secondary to varices with hemorrhagic shock.  Patient was in the ICU treated with octreotide, Rocephin, and received a total of 2 units of packed red blood cells.  Dr. Therisa Best of GI performed the EGD requiring 6 bands to eradicate esophageal varices found.  ED Course: On admission into the emergency department patient was seen to be tachypneic and tachycardic, but all other vital signs maintained.  Labs significant for WBC 12.2, hemoglobin 13.2, sodium 139, CO2 22, anion gap 18, AST 43, ALT 19, total bilirubin 1.3, alcohol level 69, and INR 1.3.  UA was negative.  Stool guaiacs were noted to be positive.  Dr. Cristina Best of gastroenterology was consulted.  Patient was given 40 mg of Protonix IV and started on octreotide drip.  TRH called to admit.  Review of Systems  Constitutional: Positive for diaphoresis. Negative for fever.  HENT: Negative for ear discharge and nosebleeds.   Eyes: Negative for photophobia and pain.  Respiratory: Negative  for cough and shortness of breath.   Cardiovascular: Negative for chest pain and leg swelling.  Gastrointestinal: Positive for blood in stool and vomiting.  Genitourinary: Negative for dysuria and hematuria.  Musculoskeletal: Negative for falls and myalgias.  Skin: Negative for rash.  Neurological: Positive for tremors. Negative for loss of consciousness.  Endo/Heme/Allergies: Negative for polydipsia.  Psychiatric/Behavioral: Positive for substance abuse. Negative for memory loss.    Past Medical History:  Diagnosis Date  . Cirrhosis of liver ([place])   . Esophageal varices with hemorrhage ([place])   . ETOH abuse   . GERD (gastroesophageal reflux disease)     Past Surgical History:  Procedure Laterality Date  . ESOPHAGEAL BANDING  08/30/2019   Procedure: ESOPHAGEAL BANDING;  Surgeon: Ronnette Juniper, MD;  Location: [facility] ENDOSCOPY;  Service: Gastroenterology;;  . ESOPHAGOGASTRODUODENOSCOPY Left 08/21/2015   Procedure: ESOPHAGOGASTRODUODENOSCOPY (EGD);  Surgeon: Wilford Corner, MD;  Location: [facility] ENDOSCOPY;  Service: Endoscopy;  Laterality: Left;  . ESOPHAGOGASTRODUODENOSCOPY (EGD) WITH PROPOFOL N/A 08/30/2019   Procedure: ESOPHAGOGASTRODUODENOSCOPY (EGD) WITH PROPOFOL;  Surgeon: Ronnette Juniper, MD;  Location: [facility];  Service: Gastroenterology;  Laterality: N/A;     reports that he has quit smoking. His smoking use included cigarettes. He smoked 1.00 pack per day. He has never used smokeless tobacco. He reports current alcohol use. He reports current drug use. Drug: Marijuana.  Allergies  Allergen Reactions  . Penicillins Hives    Family History  Problem Relation Age of Onset  . Peptic Ulcer Paternal Aunt   . Crohn's disease Maternal Uncle   .  Heart attack Maternal Uncle   . Heart attack Maternal Grandfather   . Stroke Maternal Grandmother   . Stroke Paternal Grandfather     Prior to Admission medications   Medication Sig Start Date End Date Taking? Authorizing Provider  Ferrous  Sulfate (IRON) 325 (65 Fe) MG TABS Take 1 tablet (325 mg total) by mouth daily. 09/02/19  Yes Regalado, Belkys A, MD  lactulose (CHRONULAC) 10 GM/15ML solution Take 45 mLs (30 g total) by mouth 2 (two) times daily. 09/02/19  Yes Regalado, Belkys A, MD  magnesium oxide (MAG-OX) 400 (241.3 Mg) MG tablet Take 1 tablet (400 mg total) by mouth 2 (two) times daily. 09/02/19  Yes Regalado, Belkys A, MD  Multiple Vitamin (MULTIVITAMIN WITH MINERALS) TABS tablet Take 1 tablet by mouth daily. 07/19/19  Yes Johnson, Clanford L, MD  nadolol (CORGARD) 20 MG tablet Take 1 tablet (20 mg total) by mouth daily. 09/02/19  Yes Regalado, Belkys A, MD  omeprazole (PRILOSEC) 20 MG capsule Take 1 capsule (20 mg total) by mouth daily. 09/02/19  Yes Regalado, Belkys A, MD  potassium chloride SA (KLOR-CON) 20 MEQ tablet Take 1 tablet (20 mEq total) by mouth daily. 09/02/19  Yes Regalado, Belkys A, MD  sertraline (ZOLOFT) 100 MG tablet Take 1 tablet (100 mg total) by mouth daily. 09/02/19  Yes Regalado, Prentiss Bells, MD    Physical Exam:  Constitutional: Middle-age male who appears to be in no acute distress at this time Vitals:   10/09/19 0256 10/09/19 0629 10/09/19 0853 10/09/19 0900  BP: 121/83 99/65 129/87 127/85  Pulse: 100 88 100 96  Resp: 16 16 17  (!) 26  Temp: 97.7 F (36.5 C) 97.9 F (36.6 C) 97.8 F (36.6 C)   TempSrc: Oral Oral Oral   SpO2: 100% 100% 100% 100%  Weight:      Height:       Eyes: PERRL, lids and conjunctivae normal ENMT: Mucous membranes are moist. Posterior pharynx clear of any exudate or lesions.  Neck: normal, supple, no masses, no thyromegaly Respiratory: clear to auscultation bilaterally, no wheezing, no crackles. Normal respiratory effort. No accessory muscle use.  Cardiovascular: Regular rate and rhythm, no murmurs / rubs / gallops. No extremity edema. 2+ pedal pulses. No carotid bruits.  Abdomen: no tenderness, no masses palpated. No hepatosplenomegaly. Bowel sounds positive.  Musculoskeletal: no  clubbing / cyanosis. No joint deformity upper and lower extremities. Good ROM, no contractures. Normal muscle tone.  Skin: no rashes, lesions, ulcers. No induration Neurologic: CN 2-12 grossly intact. Strength 5/5 in all 4.  Mild tremor appreciated. Psychiatric: Normal judgment and insight. Alert and oriented x 3. Normal mood.     Labs on Admission: I have personally reviewed following labs and imaging studies  CBC: Recent Labs  Lab 10/09/19 0303  WBC 12.2*  NEUTROABS 9.7*  HGB 13.2  HCT 39.6  MCV 94.5  PLT 294   Basic Metabolic Panel: Recent Labs  Lab 10/09/19 0303  NA 139  K 3.7  CL 99  CO2 22  GLUCOSE 151*  BUN 16  CREATININE 0.81  CALCIUM 8.9   GFR: Estimated Creatinine Clearance: 112.6 mL/min (by C-G formula based on SCr of 0.81 mg/dL). Liver Function Tests: Recent Labs  Lab 10/09/19 0303  AST 43*  ALT 19  ALKPHOS 49  BILITOT 1.3*  PROT 7.6  ALBUMIN 3.6   Recent Labs  Lab 10/09/19 0303  LIPASE 23   Recent Labs  Lab 10/09/19 0837  AMMONIA 16   Coagulation  Profile: Recent Labs  Lab 10/09/19 0303  INR 1.3*   Cardiac Enzymes: No results for input(s): CKTOTAL, CKMB, CKMBINDEX, TROPONINI in the last 168 hours. BNP (last 3 results) No results for input(s): PROBNP in the last 8760 hours. HbA1C: No results for input(s): HGBA1C in the last 72 hours. CBG: No results for input(s): GLUCAP in the last 168 hours. Lipid Profile: No results for input(s): CHOL, HDL, LDLCALC, TRIG, CHOLHDL, LDLDIRECT in the last 72 hours. Thyroid Function Tests: No results for input(s): TSH, T4TOTAL, FREET4, T3FREE, THYROIDAB in the last 72 hours. Anemia Panel: No results for input(s): VITAMINB12, FOLATE, FERRITIN, TIBC, IRON, RETICCTPCT in the last 72 hours. Urine analysis:    Component Value Date/Time   COLORURINE YELLOW 10/09/2019 0300   APPEARANCEUR CLEAR 10/09/2019 0300   LABSPEC 1.028 10/09/2019 0300   PHURINE 6.0 10/09/2019 0300   GLUCOSEU NEGATIVE  10/09/2019 0300   HGBUR NEGATIVE 10/09/2019 0300   BILIRUBINUR NEGATIVE 10/09/2019 0300   KETONESUR 20 (A) 10/09/2019 0300   PROTEINUR 30 (A) 10/09/2019 0300   NITRITE NEGATIVE 10/09/2019 0300   LEUKOCYTESUR NEGATIVE 10/09/2019 0300   Sepsis Labs: No results found for this or any previous visit (from the past 240 hour(s)).   Radiological Exams on Admission: No results found.  EKG: Independently reviewed.  Sinus rhythm at 95 bpm  Assessment/Plan Hematemesis, GI bleed: Acute.  Patient presents with complaints of vomiting blood and dark stools this morning.  Stool guaiacs were noted to be positive.  Vital signs noted to be stable and initial hemoglobin 13.2.  Patient last had endoscopic on 5/4 with Dr. Marca Ancona noting grade III and large esophageal varices for which 6 bands were utilized to completely eradicate varices and a single 4 mm suspected benign papilloma were found. -Admit to a medical telemetry bed -Aspiration precautions with elevation of the head of bed greater than 30 degrees -Type and screen for possible need of blood products -Serial monitoring of H&H and transfuse blood products as needed -Check portable chest x-ray -Continue octreotide drip -Start Protonix drip -Empiric prophylaxis with Rocephin IV  -Dr. Matthias Hughs of GI consulted, we will follow-up for further recommendations  Leukocytosis/lactic acidosis: Acute.  WBC elevated at 12.2 with initial lactic acid 3.8.  Anion gap elevated to 18, but suspect related with lactic acidosis. -Recheck CBC in a.m.  Alcohol abuse: Patient reports intermittent quitting drinking alcohol.  However, last few days had been drinking 1/2pint or so.  Alcohol level noted to be 69 on admission. 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  Elevated AST, hyperbilirubinemia: Acute on  chronic.  On admission AST 43 and total bilirubin 1.3. The AST to ALT ratio suggestive of alcohol abuse.  -Continue to monitor  DVT prophylaxis: SCDs Code Status: Full Family Communication: Patient declined need an update at this time. Disposition Plan: Likely discharge home once medically stable Consults called: GI Admission status: Observation  Clydie Braun MD [facility] Pager [phone]   If 7PM-7AM, please contact night-coverage [email or web address] Password [facility]  10/09/2019, 9:43 AM

## 2019-10-09 NOTE — ED Provider Notes (Signed)
[facility] EMERGENCY DEPARTMENT Provider Note   CSN: [id number] Arrival date & time: 10/09/19  0239     History Chief Complaint  Patient presents with  . GI Bleeding    [name] is a 39 y.o. male with PMHx cirrhosis, esophageal varices, GERD, alcohol abuse who presents to the ED via EMS for GI bleed.  Per patient he drinks 1 mixed drinks with vodka last night.  He went to bed and woke up around midnight with hematemesis.  He also reports one episode of dark black stool.  He states that he would estimate about 2 emesis bags full of blood with clots.  Patient called EMS and was provided Zofran.  He states this stopped his vomiting.  He has not vomited since around 1 AM and sat in the waiting room for several hours with stable vitals.  Patient states he feels "generally unwell."  He estimates he probably drinks about 1 pint of vodka per day.  He states he has tried to cut back recently.   Of note patient was admitted to the hospital from 5/4-5/7 with upper GI bleed secondary to varices.  He was treated with octreotide as well as covered with ceftriaxone for SBP in the ED.  He had received 2 units of packed red blood cells.  He had undergone an EGD with banding by Dr. Therisa Doyne.  He states he was being followed by [facility] Dr. Michail Sermon however missed several appointments and was discharged from the practice.   The history is provided by the patient and medical records.       Past Medical History:  Diagnosis Date  . Cirrhosis of liver ([place])   . Esophageal varices with hemorrhage ([place])   . ETOH abuse   . GERD (gastroesophageal reflux disease)     Patient Active Problem List   Diagnosis Date Noted  . Elevated lactic acid level 09/02/2019  . Bleeding esophageal varices ([place]) 09/02/2019  . Shock ([place]) 08/30/2019  . Electrolyte disturbance 07/16/2019  . Lactic acidosis 07/16/2019  . Hyperammonemia ([facility]) 07/16/2019  . Hyperbilirubinemia 07/16/2019  . Tachycardia  11/14/2018  . Acute encephalopathy   . Acute upper GI bleed   . Ascites   . Distended abdomen   . Hemorrhagic shock ([place])   . Encounter for palliative care   . Goals of care, counseling/discussion   . Encephalopathy acute 08/27/2015  . Alcoholic cirrhosis ([place])   . Acute hypoxemic respiratory failure ([place])   . SBP (spontaneous bacterial peritonitis) ([place])   . Hypocalcemia 08/21/2015  . Hypomagnesemia 08/21/2015  . Acute blood loss anemia 08/20/2015  . Acute GI bleeding 08/20/2015  . Alcohol abuse 08/20/2015  . Elevated INR 08/20/2015  . Hypoalbuminemia 08/20/2015  . Hypokalemia 08/20/2015    Past Surgical History:  Procedure Laterality Date  . ESOPHAGEAL BANDING  08/30/2019   Procedure: ESOPHAGEAL BANDING;  Surgeon: Ronnette Juniper, MD;  Location: [facility] ENDOSCOPY;  Service: Gastroenterology;;  . ESOPHAGOGASTRODUODENOSCOPY Left 08/21/2015   Procedure: ESOPHAGOGASTRODUODENOSCOPY (EGD);  Surgeon: Wilford Corner, MD;  Location: [facility] ENDOSCOPY;  Service: Endoscopy;  Laterality: Left;  . ESOPHAGOGASTRODUODENOSCOPY (EGD) WITH PROPOFOL N/A 08/30/2019   Procedure: ESOPHAGOGASTRODUODENOSCOPY (EGD) WITH PROPOFOL;  Surgeon: Ronnette Juniper, MD;  Location: [place] [facility] [place];  Service: Gastroenterology;  Laterality: N/A;       Family History  Problem Relation Age of Onset  . Peptic Ulcer Paternal Aunt   . Crohn's disease Maternal Uncle   . Heart attack Maternal Uncle   . Heart attack Maternal  Grandfather   . Stroke Maternal Grandmother   . Stroke Paternal Grandfather     Social History   Tobacco Use  . Smoking status: Former Smoker    Packs/day: 1.00    Types: Cigarettes  . Smokeless tobacco: Never Used  Vaping Use  . Vaping Use: Never used  Substance Use Topics  . Alcohol use: Yes    Comment: Alcoholism  . Drug use: Yes    Types: Marijuana    Home Medications Prior to Admission medications   Medication Sig Start Date End Date Taking? Authorizing Provider  Ferrous Sulfate (IRON) 325 (65  Fe) MG TABS Take 1 tablet (325 mg total) by mouth daily. 09/02/19  Yes Regalado, Belkys A, MD  lactulose (CHRONULAC) 10 GM/15ML solution Take 45 mLs (30 g total) by mouth 2 (two) times daily. 09/02/19  Yes Regalado, Belkys A, MD  magnesium oxide (MAG-OX) 400 (241.3 Mg) MG tablet Take 1 tablet (400 mg total) by mouth 2 (two) times daily. 09/02/19  Yes Regalado, Belkys A, MD  Multiple Vitamin (MULTIVITAMIN WITH MINERALS) TABS tablet Take 1 tablet by mouth daily. 07/19/19  Yes Johnson, Clanford L, MD  nadolol (CORGARD) 20 MG tablet Take 1 tablet (20 mg total) by mouth daily. 09/02/19  Yes Regalado, Belkys A, MD  omeprazole (PRILOSEC) 20 MG capsule Take 1 capsule (20 mg total) by mouth daily. 09/02/19  Yes Regalado, Belkys A, MD  potassium chloride SA (KLOR-CON) 20 MEQ tablet Take 1 tablet (20 mEq total) by mouth daily. 09/02/19  Yes Regalado, Belkys A, MD  sertraline (ZOLOFT) 100 MG tablet Take 1 tablet (100 mg total) by mouth daily. 09/02/19  Yes Regalado, Belkys A, MD    Allergies    Penicillins  Review of Systems   Review of Systems  Constitutional: Negative for chills and fever.  Respiratory: Negative for shortness of breath.   Cardiovascular: Negative for chest pain.  Gastrointestinal: Positive for blood in stool, nausea and vomiting. Negative for abdominal pain.       + hematemesis  All other systems reviewed and are negative.   Physical Exam Updated Vital Signs BP 99/65   Pulse 88   Temp 97.9 F (36.6 C) (Oral)   Resp 16   Ht 5\' 7"  (1.702 m)   Wt 65 kg   SpO2 100%   BMI 22.44 kg/m   Physical Exam Vitals and nursing note reviewed.  Constitutional:      Appearance: He is diaphoretic. He is not ill-appearing.     Comments: Tremulous  HENT:     Head: Normocephalic and atraumatic.  Eyes:     General: No scleral icterus.    Conjunctiva/sclera: Conjunctivae normal.  Cardiovascular:     Rate and Rhythm: Regular rhythm. Tachycardia present.  Pulmonary:     Effort: Pulmonary effort is  normal.     Breath sounds: Normal breath sounds. No wheezing, rhonchi or rales.  Abdominal:     Palpations: Abdomen is soft.     Tenderness: There is no abdominal tenderness. There is no guarding or rebound.     Comments: Not actively vomiting  Genitourinary:    Rectum: Guaiac result positive.     Comments: GU exam performed with chaperone at bedside. Melanotic stool on gloved finger. Guaiac positive.  Musculoskeletal:     Cervical back: Neck supple.  Skin:    General: Skin is warm.     Coloration: Skin is not jaundiced.  Neurological:     Mental Status: He is alert and oriented  to person, place, and time.     ED Results / Procedures / Treatments   Labs (all labs ordered are listed, but only abnormal results are displayed) Labs Reviewed  CBC WITH DIFFERENTIAL/PLATELET - Abnormal; Notable for the following components:      Result Value   WBC 12.2 (*)    RBC 4.19 (*)    Neutro Abs 9.7 (*)    Monocytes Absolute 1.1 (*)    Basophils Absolute 0.2 (*)    Abs Immature Granulocytes 0.08 (*)    All other components within normal limits  COMPREHENSIVE METABOLIC PANEL - Abnormal; Notable for the following components:   Glucose, Bld 151 (*)    AST 43 (*)    Total Bilirubin 1.3 (*)    Anion gap 18 (*)    All other components within normal limits  PROTIME-INR - Abnormal; Notable for the following components:   Prothrombin Time 15.8 (*)    INR 1.3 (*)    All other components within normal limits  URINALYSIS, ROUTINE W REFLEX MICROSCOPIC - Abnormal; Notable for the following components:   Ketones, ur 20 (*)    Protein, ur 30 (*)    All other components within normal limits  ETHANOL - Abnormal; Notable for the following components:   Alcohol, Ethyl (B) 69 (*)    All other components within normal limits  POC OCCULT BLOOD, ED - Abnormal; Notable for the following components:   Fecal Occult Bld POSITIVE (*)    All other components within normal limits  SARS CORONAVIRUS 2 BY RT PCR  (HOSPITAL ORDER, PERFORMED IN  HOSPITAL LAB)  LIPASE, BLOOD  AMMONIA  LACTIC ACID, PLASMA  LACTIC ACID, PLASMA  SAMPLE TO BLOOD BANK    EKG EKG Interpretation  Date/Time:  Sunday October 09 2019 08:34:48 EDT Ventricular Rate:  95 PR Interval:    QRS Duration: 81 QT Interval:  376 QTC Calculation: 473 R Axis:   23 Text Interpretation: Sinus rhythm No significant change since last tracing Confirmed by Margarita Grizzle [phone]) on 10/09/2019 9:14:53 AM   Radiology No results found.  Procedures Procedures (including critical care time)  Medications Ordered in ED Medications  LORazepam (ATIVAN) injection 0-4 mg (2 mg Intravenous Given 10/09/19 0938)    Or  LORazepam (ATIVAN) tablet 0-4 mg ( Oral See Alternative 10/09/19 0938)  LORazepam (ATIVAN) injection 0-4 mg (has no administration in time range)    Or  LORazepam (ATIVAN) tablet 0-4 mg (has no administration in time range)  thiamine tablet 100 mg (has no administration in time range)    Or  thiamine (B-1) injection 100 mg (has no administration in time range)  octreotide (SANDOSTATIN) 500 mcg in sodium chloride 0.9 % 250 mL (2 mcg/mL) infusion (has no administration in time range)  sodium chloride 0.9 % bolus 1,000 mL (1,000 mLs Intravenous New Bag/Given 10/09/19 0929)  pantoprazole (PROTONIX) injection 40 mg (40 mg Intravenous Given 10/09/19 0930)  ondansetron (ZOFRAN) injection 4 mg (4 mg Intravenous Given 10/09/19 3235)    ED Course  I have reviewed the triage vital signs and the nursing notes.  Pertinent labs & imaging results that were available during my care of the patient were reviewed by me and considered in my medical decision making (see chart for details).  Clinical Course as of Oct 09 947  Sun Oct 09, 2019  0927 Fecal Occult Blood, POC(!): POSITIVE [MV]    Clinical Course User Index [MV] Tanda Rockers, PA-C   MDM Rules/Calculators/A&P  39 year old male who presents the ED  today with complaint of GI bleeding including hematemesis and black stool that started around midnight last night after drinking 1 mixed drink of vodka.  Patient with known alcoholic liver cirrhosis as well as esophageal varices.  Had EGD done on 05/04 secondary to active GI bleeding.  Was being seen outpatient by Dr. Bosie Clos with [facility] however states that he has been released from the practice due to missing too many appointments.  Patient was given Zofran per EMS which ceased his vomiting.  On arrival to the ED patient is afebrile and nontachypneic. HR at 100.  He was stable on arrival with EMS and was placed in the waiting room and did not have any more emesis for several hours.  When patient was brought back to room he is diaphoretic and tremulous.  Heart rate continues to be at 100 exactly.  Concerned that he may be going into withdrawal.  Will place on CIWA protocol and provide Ativan.  Protonix ordered as well as fluids.  Given patient is not actively bleeding at this time octreotide held.  Will perform rectal exam as well.  GU exam with chaperone at bedside, obviously melanotic stool on gloved finger. Guaiac positive.   Lab work was obtained while patient was in the waiting room. CBC with mild leukocytosis of 12.2 however appears to be downtrending from previously elevated during last admission.  Low suspicion for infectious etiology at this time. Pt's abdomen does not appear distended at this time and there is low concern for SBP.   WBC  Date Value Ref Range Status  10/09/2019 12.2 (H) 4.0 - 10.5 K/uL Final  09/01/2019 13.6 (H) 4.0 - 10.5 K/uL Final  08/31/2019 18.6 (H) 4.0 - 10.5 K/uL Final  08/30/2019 18.9 (H) 4.0 - 10.5 K/uL Final   CMP with a glucose of 151.  AST 43.  T bili very mildly elevated 1.3.  No other LFT abnormalities.  Creatinine 0.81.  No gap.  Lipase 23. EtOH mildly elevated at 69.  UA with ketones and protein, no infection. Have added on lactic acid, EKG, and level as  patient currently on lactulose.   Discussed case with Dr. Matthias Hughs with [facility] who plans to come see patient plus or minus scoping prior to discharge from hospital.  Has recommended octreotide infusion, will order.  Plan to admit to medicine.   Dr. Katrinka Blazing with [facility] to come see patient for admission.   This note was prepared using Dragon voice recognition software and may include unintentional dictation errors due to the inherent limitations of voice recognition software.  Final Clinical Impression(s) / ED Diagnoses Final diagnoses:  Upper GI bleed  Bleeding esophageal varices, unspecified esophageal varices type (HCC)  Alcohol withdrawal syndrome without complication [facility])    Rx / DC Orders ED Discharge Orders    None       Tanda Rockers, PA-C 10/09/19 2595    Margarita Grizzle, MD 10/09/19 1441

## 2019-10-09 NOTE — H&P (View-Only) (Signed)
Referring Provider:  [facility]  Primary Care Physician:  Farris Has, MD Primary Gastroenterologist: Gentry Fitz  Reason for Consultation: Hematemesis, in the setting of esophageal varices  HPI: [name] is a very pleasant, cooperative 39 y.o. male who has been dismissed from our practice but is being seen as an unassigned patient.  He has a history of ongoing ethanol abuse, with his first variceal bleed in 2017 when he was scoped by Dr. Doy Mince, who was unable to apply bands but did sclerotherapy with ethanolamine, and more recently, of variceal bleed on Aug 30, 2019 when he was scoped by Dr. Marca Ancona and had banding of very large pan-esophageal varices which had stigmata of recent hemorrhage.  He also had portal gastropathy and questionable gastric varices.  The patient has ongoing ethanol consumption and is therefore not considered a candidate for liver transplant but the idea of TIPS had been brought up in the past.  With all that background, the patient consumes daily alcohol and had a single episode of large-volume hematemesis last night, associated with a dark stool around that time, but has not had any further bleeding from either and since then.  He was never syncopal or presyncopal.  No chest pain or dyspnea.  No focal neurologic symptoms.  No antecedent exposure to aspirin or NSAIDs.  In the emergency room, the patient had melenic stool on rectal exam, Hemoccult positive, and his hemoglobin was 13.2 as compared to a discharge hemoglobin of 10.1 approximately 6 weeks earlier.  Interestingly, platelets are normal, as is the patient's albumin of 3.6 prior to hydration.  Liver chemistries are essentially normal with an AST of 43 and ALT of 19 (they were elevated in the 250-400 range 6 weeks ago), and bilirubin is improved at 1.3 whereas previously it was 1.5.  Lipase is normal.  He has been hemodynamically stable.   Past Medical History:  Diagnosis Date  . Cirrhosis of liver (HCC)    . Esophageal varices with hemorrhage (HCC)   . ETOH abuse   . GERD (gastroesophageal reflux disease)     Past Surgical History:  Procedure Laterality Date  . ESOPHAGEAL BANDING  08/30/2019   Procedure: ESOPHAGEAL BANDING;  Surgeon: Kerin Salen, MD;  Location: [facility] ENDOSCOPY;  Service: Gastroenterology;;  . ESOPHAGOGASTRODUODENOSCOPY Left 08/21/2015   Procedure: ESOPHAGOGASTRODUODENOSCOPY (EGD);  Surgeon: Charlott Rakes, MD;  Location: [facility] ENDOSCOPY;  Service: Endoscopy;  Laterality: Left;  . ESOPHAGOGASTRODUODENOSCOPY (EGD) WITH PROPOFOL N/A 08/30/2019   Procedure: ESOPHAGOGASTRODUODENOSCOPY (EGD) WITH PROPOFOL;  Surgeon: Kerin Salen, MD;  Location: [facility] ENDOSCOPY;  Service: Gastroenterology;  Laterality: N/A;    Prior to Admission medications   Medication Sig Start Date End Date Taking? Authorizing Provider  Ferrous Sulfate (IRON) 325 (65 Fe) MG TABS Take 1 tablet (325 mg total) by mouth daily. 09/02/19  Yes Regalado, Belkys A, MD  lactulose (CHRONULAC) 10 GM/15ML solution Take 45 mLs (30 g total) by mouth 2 (two) times daily. 09/02/19  Yes Regalado, Belkys A, MD  magnesium oxide (MAG-OX) 400 (241.3 Mg) MG tablet Take 1 tablet (400 mg total) by mouth 2 (two) times daily. 09/02/19  Yes Regalado, Belkys A, MD  Multiple Vitamin (MULTIVITAMIN WITH MINERALS) TABS tablet Take 1 tablet by mouth daily. 07/19/19  Yes Johnson, Clanford L, MD  nadolol (CORGARD) 20 MG tablet Take 1 tablet (20 mg total) by mouth daily. 09/02/19  Yes Regalado, Belkys A, MD  omeprazole (PRILOSEC) 20 MG capsule Take 1 capsule (20 mg total) by mouth daily. 09/02/19  Yes Regalado,  Belkys A, MD  potassium chloride SA (KLOR-CON) 20 MEQ tablet Take 1 tablet (20 mEq total) by mouth daily. 09/02/19  Yes Regalado, Belkys A, MD  sertraline (ZOLOFT) 100 MG tablet Take 1 tablet (100 mg total) by mouth daily. 09/02/19  Yes Regalado, Belkys A, MD    Current Facility-Administered Medications  Medication Dose Route Frequency Provider Last Rate Last Admin   . acetaminophen (TYLENOL) tablet 650 mg  650 mg Oral Q6H PRN Smith, Rondell A, MD       Or  . acetaminophen (TYLENOL) suppository 650 mg  650 mg Rectal Q6H PRN Smith, Rondell A, MD      . albuterol (PROVENTIL) (2.5 MG/3ML) 0.083% nebulizer solution 2.5 mg  2.5 mg Nebulization Q6H PRN Smith, Rondell A, MD      . cefTRIAXone (ROCEPHIN) 2 g in sodium chloride 0.9 % 100 mL IVPB  2 g Intravenous Q24H Smith, Rondell A, MD 200 mL/hr at 10/09/19 1113 2 g at 10/09/19 1113  . folic acid (FOLVITE) tablet 1 mg  1 mg Oral Daily Smith, Rondell A, MD      . LORazepam (ATIVAN) injection 0-4 mg  0-4 mg Intravenous Q6H Venter, Margaux, PA-C   2 mg at 10/09/19 0938   Or  . LORazepam (ATIVAN) tablet 0-4 mg  0-4 mg Oral Q6H Venter, Margaux, PA-C      . [START ON 10/11/2019] LORazepam (ATIVAN) injection 0-4 mg  0-4 mg Intravenous Q12H Venter, Margaux, PA-C       Or  . [START ON 10/11/2019] LORazepam (ATIVAN) tablet 0-4 mg  0-4 mg Oral Q12H Venter, Margaux, PA-C      . LORazepam (ATIVAN) tablet 1-4 mg  1-4 mg Oral Q1H PRN Smith, Rondell A, MD       Or  . LORazepam (ATIVAN) injection 1-4 mg  1-4 mg Intravenous Q1H PRN Smith, Rondell A, MD      . multivitamin with minerals tablet 1 tablet  1 tablet Oral Daily Smith, Rondell A, MD   1 tablet at 10/09/19 1104  . octreotide (SANDOSTATIN) 500 mcg in sodium chloride 0.9 % 250 mL (2 mcg/mL) infusion  50 mcg/hr Intravenous Continuous Venter, Margaux, PA-C      . ondansetron (ZOFRAN) tablet 4 mg  4 mg Oral Q6H PRN Smith, Rondell A, MD       Or  . ondansetron (ZOFRAN) injection 4 mg  4 mg Intravenous Q6H PRN Smith, Rondell A, MD      . pantoprazole (PROTONIX) 80 mg in sodium chloride 0.9 % 100 mL (0.8 mg/mL) infusion  8 mg/hr Intravenous Continuous Smith, Rondell A, MD      . [START ON 10/12/2019] pantoprazole (PROTONIX) injection 40 mg  40 mg Intravenous Q12H Smith, Rondell A, MD      . sodium chloride flush (NS) 0.9 % injection 3 mL  3 mL Intravenous Q12H Smith, Rondell A, MD    3 mL at 10/09/19 1113  . thiamine tablet 100 mg  100 mg Oral Daily Venter, Margaux, PA-C   100 mg at 10/09/19 1104   Or  . thiamine (B-1) injection 100 mg  100 mg Intravenous Daily Venter, Margaux, PA-C       Current Outpatient Medications  Medication Sig Dispense Refill  . Ferrous Sulfate (IRON) 325 (65 Fe) MG TABS Take 1 tablet (325 mg total) by mouth daily. 20 tablet 0  . lactulose (CHRONULAC) 10 GM/15ML solution Take 45 mLs (30 g total) by mouth 2 (two) times daily. 240 mL 0  .   magnesium oxide (MAG-OX) 400 (241.3 Mg) MG tablet Take 1 tablet (400 mg total) by mouth 2 (two) times daily. 30 tablet 0  . Multiple Vitamin (MULTIVITAMIN WITH MINERALS) TABS tablet Take 1 tablet by mouth daily.    . nadolol (CORGARD) 20 MG tablet Take 1 tablet (20 mg total) by mouth daily. 30 tablet 0  . omeprazole (PRILOSEC) 20 MG capsule Take 1 capsule (20 mg total) by mouth daily. 30 capsule 0  . potassium chloride SA (KLOR-CON) 20 MEQ tablet Take 1 tablet (20 mEq total) by mouth daily. 5 tablet 0  . sertraline (ZOLOFT) 100 MG tablet Take 1 tablet (100 mg total) by mouth daily. 30 tablet 0    Allergies as of 10/09/2019 - Review Complete 10/09/2019  Allergen Reaction Noted  . Penicillins Hives 05/26/2018    Family History  Problem Relation Age of Onset  . Peptic Ulcer Paternal Aunt   . Crohn's disease Maternal Uncle   . Heart attack Maternal Uncle   . Heart attack Maternal Grandfather   . Stroke Maternal Grandmother   . Stroke Paternal Grandfather     Social History   Socioeconomic History  . Marital status: Single    Spouse name: Not on file  . Number of children: Not on file  . Years of education: Not on file  . Highest education level: Not on file  Occupational History  . Not on file  Tobacco Use  . Smoking status: Former Smoker    Packs/day: 1.00    Types: Cigarettes  . Smokeless tobacco: Never Used  Vaping Use  . Vaping Use: Never used  Substance and Sexual Activity  . Alcohol  use: Yes    Comment: Alcoholism  . Drug use: Yes    Types: Marijuana  . Sexual activity: Not on file  Other Topics Concern  . Not on file  Social History Narrative  . Not on file   Social Determinants of Health   Financial Resource Strain:   . Difficulty of Paying Living Expenses:   Food Insecurity:   . Worried About Programme researcher, broadcasting/film/video in the Last Year:   . Barista in the Last Year:   Transportation Needs:   . Freight forwarder (Medical):   Marland Kitchen Lack of Transportation (Non-Medical):   Physical Activity:   . Days of Exercise per Week:   . Minutes of Exercise per Session:   Stress:   . Feeling of Stress :   Social Connections:   . Frequency of Communication with Friends and Family:   . Frequency of Social Gatherings with Friends and Family:   . Attends Religious Services:   . Active Member of Clubs or Organizations:   . Attends Banker Meetings:   Marland Kitchen Marital Status:   Intimate Partner Violence:   . Fear of Current or Ex-Partner:   . Emotionally Abused:   Marland Kitchen Physically Abused:   . Sexually Abused:     Review of Systems: Negative for peripheral problems such as skin rashes, joint pains, dyspnea, chest pain, or focal neurologic symptoms  Physical Exam: Vital signs in last 24 hours: Temp:  [97.7 F (36.5 C)-97.9 F (36.6 C)] 97.8 F (36.6 C) (06/13 0853) Pulse Rate:  [85-100] 95 (06/13 1015) Resp:  [16-26] 19 (06/13 1015) BP: (99-129)/(65-87) 118/87 (06/13 1015) SpO2:  [98 %-100 %] 99 % (06/13 1015) Weight:  [65 kg] 65 kg (06/13 0254)   General:   Alert,  Well-developed, well-nourished, pleasant and cooperative in  NAD.  He does not appear to be in active withdrawal at this time although there were concerns of that expressed by the ER physician. Head:  Normocephalic and atraumatic. Eyes:  Sclera clear, no icterus.   Conjunctiva pink. Mouth:   No ulcerations or lesions.  Oropharynx pink & moist. Lungs:  Clear throughout to auscultation.   No  wheezes, crackles, or rhonchi. No evident respiratory distress. Heart:   Slightly rapid rate with regular rhythm; no murmurs, clicks, rubs,  or gallops. Abdomen:  Soft, nontender, nontympanitic, and nondistended. No masses, hepatosplenomegaly or ventral hernias noted.  Flank tympany is present, suggesting the absence of ascites. Rectal: Per EDP, melenic stool, heme positive Msk:   Symmetrical without gross deformities. Pulses:  Normal radial pulse is noted. Extremities:   Without clubbing, cyanosis, or edema. Neurologic:  Alert and coherent;  grossly normal neurologically.  No asterixis Skin:  Intact without significant lesions or rashes. Psych:   Alert and cooperative. Normal mood and affect.  Intake/Output from previous day: No intake/output data recorded. Intake/Output this shift: No intake/output data recorded.  Lab Results: Recent Labs    10/09/19 0303  WBC 12.2*  HGB 13.2  HCT 39.6  PLT 294   BMET Recent Labs    10/09/19 0303  NA 139  K 3.7  CL 99  CO2 22  GLUCOSE 151*  BUN 16  CREATININE 0.81  CALCIUM 8.9   LFT Recent Labs    10/09/19 0303  PROT 7.6  ALBUMIN 3.6  AST 43*  ALT 19  ALKPHOS 49  BILITOT 1.3*   PT/INR Recent Labs    10/09/19 0303  LABPROT 15.8*  INR 1.3*    Studies/Results: DG CHEST PORT 1 VIEW  Result Date: 10/09/2019 CLINICAL DATA:  Hematemesis EXAM: PORTABLE CHEST 1 VIEW COMPARISON:  08/30/2019 FINDINGS: Normal heart size and mediastinal contours. No acute infiltrate or edema. No effusion or pneumothorax. No acute osseous findings. Artifact from EKG leads IMPRESSION: Negative portable chest. Electronically Signed   By: Monte Fantasia M.D.   On: 10/09/2019 10:26    Impression: 1.  Hematemesis, without posthemorrhagic anemia so far, or hemodynamic instability 2.  History of esophageal varices with recurrent hemorrhage in the past, most recently 6 weeks ago at which time banding was performed.  Also history of portal gastropathy and  possible gastric varices. 3.  Alcohol use disorder, active 4.  Previous history of ascites and previously on diuretic therapy, but currently without clinical evidence of ascites without diuretic therapy  Plan: 1.  Octreotide infusion 2.  Pantoprazole by infusion or bolus 3.  Probable endoscopic evaluation in the next 1 to 2 days.  Given the patient's absence of further bleeding over the past 12 hours, his hemodynamic stability, and his normal hemoglobin, I do not see a need to do it emergently today.  However, we are on standby for urgent endoscopy if his clinical condition changes significantly. 4.  Since there is no overt ascites by abdominal exam, I do not think concurrent antibiotic therapy is necessary 5.  We will follow with you.    LOS: 0 days   Youlanda Mighty Kaige Whistler  10/09/2019, 1:07 PM   Pager [phone] If no answer or after 5 PM call [phone]

## 2019-10-09 NOTE — ED Triage Notes (Signed)
Patient arrived with EMS from home reports multiple bloody emesis and dark stool this evening/ETOH intake  , history of esophageal varices/liver disease .

## 2019-10-09 NOTE — Progress Notes (Signed)
NEW ADMISSION NOTE New Admission Note:   Arrival Method: Stretcher Mental Orientation: A&O X4  Telemetry: 5M22 Assessment: Completed Skin: WDL IV: WDL Pain: Denies Safety Measures: Safety Fall Prevention Plan has been given, discussed and signed Admission: Completed [facility] Orientation: Patient has been orientated to the room, unit and staff.   Orders have been reviewed and implemented. Will continue to monitor the patient. Call light has been placed within reach and bed alarm has been activated.   Wasyl Dornfeld BSN, RN3 

## 2019-10-10 ENCOUNTER — Encounter (HOSPITAL_COMMUNITY)
Admission: EM | Disposition: A | Discharge status: Home / Self Care | Payer: Self-pay | Source: Home / Self Care | Attending: Family Medicine

## 2019-10-10 ENCOUNTER — Observation Stay (HOSPITAL_COMMUNITY): Payer: Self-pay | Admitting: Anesthesiology

## 2019-10-10 ENCOUNTER — Encounter (HOSPITAL_COMMUNITY): Payer: Self-pay | Admitting: Internal Medicine

## 2019-10-10 DIAGNOSIS — K922 Gastrointestinal hemorrhage, unspecified: Secondary | ICD-10-CM | POA: Diagnosis present

## 2019-10-10 HISTORY — PX: ESOPHAGOGASTRODUODENOSCOPY (EGD) WITH PROPOFOL: SHX5813

## 2019-10-10 HISTORY — PX: ESOPHAGEAL BANDING: SHX5518

## 2019-10-10 LAB — CBC
HCT: 31.4 % — ABNORMAL LOW (ref 39.0–52.0)
Hemoglobin: 10.4 g/dL — ABNORMAL LOW (ref 13.0–17.0)
MCH: 31.5 pg (ref 26.0–34.0)
MCHC: 33.1 g/dL (ref 30.0–36.0)
MCV: 95.2 fL (ref 80.0–100.0)
Platelets: 120 10*3/uL — ABNORMAL LOW (ref 150–400)
RBC: 3.3 MIL/uL — ABNORMAL LOW (ref 4.22–5.81)
RDW: 14.5 % (ref 11.5–15.5)
WBC: 9.2 10*3/uL (ref 4.0–10.5)
nRBC: 0 % (ref 0.0–0.2)

## 2019-10-10 LAB — HEMOGLOBIN AND HEMATOCRIT, BLOOD
HCT: 30.7 % — ABNORMAL LOW (ref 39.0–52.0)
Hemoglobin: 10.1 g/dL — ABNORMAL LOW (ref 13.0–17.0)

## 2019-10-10 LAB — COMPREHENSIVE METABOLIC PANEL
ALT: 18 U/L (ref 0–44)
AST: 36 U/L (ref 15–41)
Albumin: 3.1 g/dL — ABNORMAL LOW (ref 3.5–5.0)
Alkaline Phosphatase: 43 U/L (ref 38–126)
Anion gap: 11 (ref 5–15)
BUN: 12 mg/dL (ref 6–20)
CO2: 27 mmol/L (ref 22–32)
Calcium: 7.9 mg/dL — ABNORMAL LOW (ref 8.9–10.3)
Chloride: 104 mmol/L (ref 98–111)
Creatinine, Ser: 0.92 mg/dL (ref 0.61–1.24)
GFR calc Af Amer: >60 mL/min (ref >60)
GFR calc non Af Amer: >60 mL/min (ref >60)
Glucose, Bld: 106 mg/dL — ABNORMAL HIGH (ref 70–99)
Potassium: 3.3 mmol/L — ABNORMAL LOW (ref 3.5–5.1)
Sodium: 142 mmol/L (ref 135–145)
Total Bilirubin: 1.5 mg/dL — ABNORMAL HIGH (ref 0.3–1.2)
Total Protein: 6.5 g/dL (ref 6.5–8.1)

## 2019-10-10 LAB — LACTIC ACID, PLASMA: Lactic Acid, Venous: 1.5 mmol/L (ref 0.5–1.9)

## 2019-10-10 SURGERY — ESOPHAGOGASTRODUODENOSCOPY (EGD) WITH PROPOFOL
Anesthesia: Monitor Anesthesia Care

## 2019-10-10 MED ORDER — PANTOPRAZOLE SODIUM 40 MG IV SOLR
40.0000 mg | Freq: Two times a day (BID) | INTRAVENOUS | Status: DC
  Administered 2019-10-10 – 2019-10-11 (×2): 40 mg via INTRAVENOUS
  Filled 2019-10-10 (×2): qty 40

## 2019-10-10 MED ORDER — SODIUM CHLORIDE 0.9 % IV SOLN: INTRAVENOUS | Status: DC

## 2019-10-10 MED ORDER — NADOLOL 20 MG PO TABS
20.0000 mg | ORAL_TABLET | Freq: Every day | ORAL | Status: DC
  Administered 2019-10-10 – 2019-10-11 (×2): 20 mg via ORAL
  Filled 2019-10-10 (×2): qty 1

## 2019-10-10 MED ORDER — POTASSIUM CHLORIDE CRYS ER 20 MEQ PO TBCR
20.0000 meq | EXTENDED_RELEASE_TABLET | Freq: Every day | ORAL | Status: DC
  Administered 2019-10-10 – 2019-10-11 (×2): 20 meq via ORAL
  Filled 2019-10-10 (×2): qty 1

## 2019-10-10 MED ORDER — ALUM & MAG HYDROXIDE-SIMETH 200-200-20 MG/5ML PO SUSP
15.0000 mL | Freq: Once | ORAL | Status: AC
  Administered 2019-10-10: 15 mL via ORAL
  Filled 2019-10-10: qty 30

## 2019-10-10 MED ORDER — POTASSIUM CHLORIDE CRYS ER 20 MEQ PO TBCR
40.0000 meq | EXTENDED_RELEASE_TABLET | ORAL | Status: AC
  Filled 2019-10-10: qty 2

## 2019-10-10 MED ORDER — LACTATED RINGERS IV SOLN: INTRAVENOUS | Status: DC

## 2019-10-10 MED ORDER — PROPOFOL 10 MG/ML IV BOLUS
INTRAVENOUS | Status: DC | PRN
  Administered 2019-10-10: 20 mg via INTRAVENOUS
  Administered 2019-10-10: 50 mg via INTRAVENOUS
  Administered 2019-10-10: 20 mg via INTRAVENOUS
  Administered 2019-10-10: 10 mg via INTRAVENOUS

## 2019-10-10 MED ORDER — ADULT MULTIVITAMIN W/MINERALS CH: 1.0000 | ORAL_TABLET | Freq: Every day | ORAL | Status: DC

## 2019-10-10 MED ORDER — LACTULOSE 10 GM/15ML PO SOLN
30.0000 g | Freq: Two times a day (BID) | ORAL | Status: DC
  Administered 2019-10-10 – 2019-10-11 (×2): 30 g via ORAL
  Filled 2019-10-10 (×2): qty 45

## 2019-10-10 MED ORDER — PROPOFOL 500 MG/50ML IV EMUL
INTRAVENOUS | Status: DC | PRN
  Administered 2019-10-10: 100 ug/kg/min via INTRAVENOUS

## 2019-10-10 MED ORDER — SERTRALINE HCL 100 MG PO TABS
100.0000 mg | ORAL_TABLET | Freq: Every day | ORAL | Status: DC
  Administered 2019-10-10 – 2019-10-11 (×2): 100 mg via ORAL
  Filled 2019-10-10 (×3): qty 1

## 2019-10-10 MED ORDER — LIDOCAINE 2% (20 MG/ML) 5 ML SYRINGE
INTRAMUSCULAR | Status: DC | PRN
  Administered 2019-10-10: 25 mg via INTRAVENOUS
  Administered 2019-10-10: 50 mg via INTRAVENOUS
  Administered 2019-10-10: 25 mg via INTRAVENOUS

## 2019-10-10 MED ORDER — FERROUS SULFATE 325 (65 FE) MG PO TABS
325.0000 mg | ORAL_TABLET | Freq: Every day | ORAL | Status: DC
  Administered 2019-10-10 – 2019-10-11 (×2): 325 mg via ORAL
  Filled 2019-10-10 (×2): qty 1

## 2019-10-10 MED ORDER — MAGNESIUM OXIDE 400 (241.3 MG) MG PO TABS
400.0000 mg | ORAL_TABLET | Freq: Two times a day (BID) | ORAL | Status: DC
  Administered 2019-10-10 – 2019-10-11 (×2): 400 mg via ORAL
  Filled 2019-10-10 (×2): qty 1

## 2019-10-10 MED ORDER — PANTOPRAZOLE SODIUM 40 MG PO TBEC: 40.0000 mg | DELAYED_RELEASE_TABLET | Freq: Every day | ORAL | Status: DC

## 2019-10-10 SURGICAL SUPPLY — 15 items

## 2019-10-10 NOTE — Anesthesia Preprocedure Evaluation (Addendum)
Anesthesia Evaluation  Patient identified by MRN, date of birth, ID band Patient awake    Reviewed: Allergy & Precautions, NPO status , Patient's Chart, lab work & pertinent test results, reviewed documented beta blocker date and time   History of Anesthesia Complications Negative for: history of anesthetic complications  Airway Mallampati: II  TM Distance: >3 FB Neck ROM: Full    Dental  (+) Poor Dentition, Dental Advisory Given, Chipped,    Pulmonary former smoker,  08/30/2019 SARS coronavirus NEG   breath sounds clear to auscultation       Cardiovascular hypertension, Pt. on home beta blockers and Pt. on medications (-) angina Rhythm:Regular Rate:Normal  '17 ECHO: EF 60-65%, valves OK   Neuro/Psych PSYCHIATRIC DISORDERS (ETOH abuse)    GI/Hepatic GERD  Medicated and Poorly Controlled,(+) Cirrhosis  (GI bleed)  Esophageal Varices  substance abuse  alcohol use and marijuana use,   Endo/Other  negative endocrine ROS  Renal/GU negative Renal ROS     Musculoskeletal   Abdominal   Peds  Hematology  (+) Blood dyscrasia (Hb 10.4, plt 120), anemia ,   Anesthesia Other Findings S/p EGD with banding 08/30/2019 Presented to ED on 6/13 with hematemesis  Reproductive/Obstetrics                            Anesthesia Physical  Anesthesia Plan  ASA: III  Anesthesia Plan:    Post-op Pain Management:    Induction:   PONV Risk Score and Plan: Treatment may vary due to age or medical condition  Airway Management Planned:   Additional Equipment:   Intra-op Plan:   Post-operative Plan:   Informed Consent: I have reviewed the patients History and Physical, chart, labs and discussed the procedure including the risks, benefits and alternatives for the proposed anesthesia with the patient or authorized representative who has indicated his/her understanding and acceptance.     Dental advisory  given  Plan Discussed with: CRNA and Surgeon  Anesthesia Plan Comments:         Anesthesia Quick Evaluation

## 2019-10-10 NOTE — Anesthesia Postprocedure Evaluation (Signed)
Anesthesia Post Note  Patient: [name] [facility]  Procedure(s) Performed: ESOPHAGOGASTRODUODENOSCOPY (EGD) WITH PROPOFOL (N/A ) ESOPHAGEAL BANDING     Patient location during evaluation: Endoscopy Anesthesia Type: General Level of consciousness: awake and alert Pain management: pain level controlled Vital Signs Assessment: post-procedure vital signs reviewed and stable Respiratory status: spontaneous breathing, nonlabored ventilation, respiratory function stable and patient connected to nasal cannula oxygen Cardiovascular status: blood pressure returned to baseline and stable Postop Assessment: no apparent nausea or vomiting Anesthetic complications: no   No complications documented.  Last Vitals:  Vitals:   10/10/19 0916 10/10/19 0926  BP: (!) 140/94 138/89  Pulse: 83 92  Resp: 16 (!) 25  Temp:    SpO2: 98% 99%    Last Pain:  Vitals:   10/10/19 0854  TempSrc: Oral  PainSc: 0-No pain                 Corinne Goucher L Mariah Gerstenberger

## 2019-10-10 NOTE — Brief Op Note (Signed)
10/09/2019 - 10/10/2019  8:52 AM  PATIENT:  [name]  39 y.o. male  PRE-OPERATIVE DIAGNOSIS:  varices  POST-OPERATIVE DIAGNOSIS:  esophageal varcies with banding x 5   PROCEDURE:  Procedure(s): ESOPHAGOGASTRODUODENOSCOPY (EGD) WITH PROPOFOL (N/A) ESOPHAGEAL BANDING  SURGEON:  Surgeon(s) and Role:    Ronnette Juniper, MD - Primary  PHYSICIAN ASSISTANT:   ASSISTANTS: Benay Pillow, RN, Laverda Sorenson, Tech  ANESTHESIA:   MAC  EBL:  Minimal  BLOOD ADMINISTERED:none  DRAINS: none   LOCAL MEDICATIONS USED:  NONE  SPECIMEN:  No Specimen  DISPOSITION OF SPECIMEN:  N/A  COUNTS:  YES  TOURNIQUET:  * No tourniquets in log *  DICTATION: .Dragon Dictation  PLAN OF CARE: Admit to inpatient   PATIENT DISPOSITION:  PACU - hemodynamically stable.   Delay start of Pharmacological VTE agent (>24hrs) due to surgical blood loss or risk of bleeding: not applicable

## 2019-10-10 NOTE — Op Note (Signed)
[facility] Patient Name: [name] Procedure Date : 10/10/2019 MRN: [id number] Attending MD: Ronnette Juniper , MD Date of Birth: 1980-06-09 CSN: [id number] Age: 39 Admit Type: Inpatient Procedure:                Upper GI endoscopy Indications:              Hematemesis, Melena, For therapy of esophageal                            varices Providers:                Ronnette Juniper, MD, Benay Pillow, RN, Laverda Sorenson,                            Technician, Judeth Cornfield, CRNA Referring MD:             [facility] Medicines:                Monitored Anesthesia Care Complications:            No immediate complications. Estimated blood loss:                            Minimal. Estimated Blood Loss:     Estimated blood loss was minimal. Procedure:                Pre-Anesthesia Assessment:                           - Prior to the procedure, a History and Physical                            was performed, and patient medications and                            allergies were reviewed. The patient's tolerance of                            previous anesthesia was also reviewed. The risks                            and benefits of the procedure and the sedation                            options and risks were discussed with the patient.                            All questions were answered, and informed consent                            was obtained. Prior Anticoagulants: The patient has                            taken no previous anticoagulant or antiplatelet                            agents. ASA  Grade Assessment: II - A patient with                            mild systemic disease. After reviewing the risks                            and benefits, the patient was deemed in                            satisfactory condition to undergo the procedure.                           After obtaining informed consent, the endoscope was                            passed under direct  vision. Throughout the                            procedure, the patient's blood pressure, pulse, and                            oxygen saturations were monitored continuously. The                            GIF-H190 ([id number]) Olympus gastroscope was                            introduced through the mouth, and advanced to the                            second part of duodenum. The upper GI endoscopy was                            accomplished without difficulty. The patient                            tolerated the procedure well. Scope In: Scope Out: Findings:      Grade III varices were found in the middle third of the esophagus and in       the lower third of the esophagus. They were medium in size. Seven bands       were deployed but only five bands were successfully placed with complete       eradication, resulting in deflation of varices. There was no bleeding at       the end of the procedure. There was presence of red nipple sign and       active bleeding was noted after two of the bands were deployed.      Moderate portal hypertensive gastropathy was found in the entire       examined stomach.      The cardia and gastric fundus were normal on retroflexion.      The examined duodenum was normal. Impression:               - Grade III esophageal varices. Completely  eradicated. Banded.                           - Portal hypertensive gastropathy.                           - Normal examined duodenum.                           - No specimens collected. Moderate Sedation:      Patient did not receive moderate sedation for this procedure, but       instead received monitored anesthesia care. Recommendation:           - NPO for 6 hours, clear liquid diet thereafter.                           - IV octreotide drip for at least another 24 hours,                            IV PPI BID. Procedure Code(s):        --- Professional ---                           [phone],  Esophagogastroduodenoscopy, flexible,                            transoral; with band ligation of esophageal/gastric                            varices Diagnosis Code(s):        --- Professional ---                           I85.00, Esophageal varices without bleeding                           K76.6, Portal hypertension                           K31.89, Other diseases of stomach and duodenum                           K92.0, Hematemesis                           K92.1, Melena (includes Hematochezia) CPT copyright 2019 [employer] Association. All rights reserved. The codes documented in this report are preliminary and upon coder review may  be revised to meet current compliance requirements. Kerin Salen, MD 10/10/2019 8:52:17 AM This report has been signed electronically. Number of Addenda: 0

## 2019-10-10 NOTE — TOC Initial Note (Signed)
Transition of Care [facility]) - Initial/Assessment Note    Patient Details  Name: [name]: [id number] Date of Birth: 1981/03/25  Transition of [facility]) CM/SW Contact:    Bess Kinds, RN Phone Number: [phone] 10/10/2019, 2:39 PM  Clinical Narrative:                  Spoke with patient at the bedside. PTA home with family. Has DME at home and handicap bathroom. States he should be able to afford discharge medications. Verified PCP in Epic as correct - encouraged to make hospital follow up appointment in 1-2 weeks. States that his mom will pick him up at discharge. Discussed needs for substance abuse counseling information. Patient states that he still has the information given during his last admission, but has not followed up yet. Encouraged to follow up to get needed support for his addiction.  Discussed need for TOC following for transition needs.   Expected Discharge Plan: Home/Self Care Barriers to Discharge: Continued Medical Work up   Patient Goals and CMS Choice Patient states their goals for this hospitalization and ongoing recovery are:: return home CMS Medicare.gov Compare Post Acute Care list provided to:: Patient Choice offered to / list presented to : NA  Expected Discharge Plan and Services Expected Discharge Plan: Home/Self Care In-house Referral: Clinical Social Work Discharge Planning Services: CM Consult Post Acute Care Choice: NA Living arrangements for the past 2 months: Single Family Home                 DME Arranged: N/A DME Agency: NA       HH Arranged: NA HH Agency: NA        Prior Living Arrangements/Services Living arrangements for the past 2 months: Single Family Home Lives with:: Self, Parents Patient language and need for interpreter reviewed:: Yes        Need for Family Participation in Patient Care: Yes (Comment) Care giver support system in place?: Yes (comment) Current home services: DME Criminal Activity/Legal  Involvement Pertinent to Current Situation/Hospitalization: No - Comment as needed  Activities of Daily Living Home Assistive Devices/Equipment: Bedside commode/3-in-1, Blood pressure cuff, CBG Meter, Contact lenses, Eyeglasses, Grab bars in shower, Hand-held shower hose, Long-handled sponge ADL Screening (condition at time of admission) Patient's cognitive ability adequate to safely complete daily activities?: Yes Is the patient deaf or have difficulty hearing?: No Does the patient have difficulty seeing, even when wearing glasses/contacts?: No Does the patient have difficulty concentrating, remembering, or making decisions?: No Patient able to express need for assistance with ADLs?: Yes Does the patient have difficulty dressing or bathing?: No Independently performs ADLs?: Yes (appropriate for developmental age) Does the patient have difficulty walking or climbing stairs?: No Weakness of Legs: None Weakness of Arms/Hands: None  Permission Sought/Granted                  Emotional Assessment Appearance:: Appears stated age Attitude/Demeanor/Rapport: Engaged Affect (typically observed): Accepting Orientation: : Oriented to Self, Oriented to  Time, Oriented to Place, Oriented to Situation Alcohol / Substance Use: Alcohol Use Psych Involvement: No (comment)  Admission diagnosis:  Hematemesis [K92.0] GI bleed [K92.2] Upper GI bleed [K92.2] Alcohol withdrawal syndrome without complication (HCC) [F10.230] Bleeding esophageal varices, unspecified esophageal varices type (HCC) [I85.01] Upper GI bleeding [K92.2] Patient Active Problem List   Diagnosis Date Noted  . Upper GI bleeding 10/10/2019  . GI bleed 10/09/2019  . Hematemesis 10/09/2019  . Leukocytosis 10/09/2019  . Elevated liver  enzymes 10/09/2019  . Elevated lactic acid level 09/02/2019  . Bleeding esophageal varices ([place]) 09/02/2019  . Shock ([place]) 08/30/2019  . Electrolyte disturbance 07/16/2019  . Lactic acidosis  07/16/2019  . Hyperammonemia ([place]) 07/16/2019  . Hyperbilirubinemia 07/16/2019  . Tachycardia 11/14/2018  . Acute encephalopathy   . Acute upper GI bleed   . Ascites   . Distended abdomen   . Hemorrhagic shock ([place])   . Encounter for palliative care   . Goals of care, counseling/discussion   . Encephalopathy acute 08/27/2015  . Alcoholic cirrhosis ([place])   . Acute hypoxemic respiratory failure ([place])   . SBP (spontaneous bacterial peritonitis) ([place])   . Hypocalcemia 08/21/2015  . Hypomagnesemia 08/21/2015  . Acute blood loss anemia 08/20/2015  . Acute GI bleeding 08/20/2015  . Alcohol abuse 08/20/2015  . Elevated INR 08/20/2015  . Hypoalbuminemia 08/20/2015  . Hypokalemia 08/20/2015   PCP:  London Pepper, MD Pharmacy:   [facility] [place], [place] AT [address] [place] [address] Phone: [phone] Fax: [phone]     Social Determinants of Health (SDOH) Interventions    Readmission Risk Interventions No flowsheet data found.

## 2019-10-10 NOTE — Progress Notes (Signed)
PROGRESS NOTE    HARITH MCCADDEN  JIR:[id number] DOB: Jul 19, 1980 DOA: 10/09/2019 PCP: Farris Has, MD   Brief Narrative:  HPI: [name] is a 39 y.o. male with medical history significant of alcohol abuse, alcohol-related cirrhosis with esophageal varices, alcoholic hepatitis, and GI bleed previously due to varices presents with complaints of vomiting blood around midnight.  He had been drinking half a pint of vodka per day over the last 3 days or so and had a mixed drink prior to onset of symptoms.  He reports filling up two emesis bags of dark blood with clots in it.  Associated symptoms included with, chills, and 1 dark black tarry stool.  Denied having any chest pain, shortness of breath, fever, or abdominal pain.   Last hospitalized from 5/4-5/7 for upper GI bleed secondary to varices with hemorrhagic shock.  Patient was in the ICU treated with octreotide, Rocephin, and received a total of 2 units of packed red blood cells.  Dr. Marca Ancona of GI performed the EGD requiring 6 bands to eradicate esophageal varices found.  ED Course: On admission into the emergency department patient was seen to be tachypneic and tachycardic, but all other vital signs maintained.  Labs significant for WBC 12.2, hemoglobin 13.2, sodium 139, CO2 22, anion gap 18, AST 43, ALT 19, total bilirubin 1.3, alcohol level 69, and INR 1.3.  UA was negative.  Stool guaiacs were noted to be positive.  Dr. Matthias Hughs of gastroenterology was consulted.  Patient was given 40 mg of Protonix IV and started on octreotide drip.  TRH called to admit.  Assessment & Plan:   Principal Problem:   GI bleed Active Problems:   Alcohol abuse   Lactic acidosis   Hyperbilirubinemia   Hematemesis   Leukocytosis   Elevated liver enzymes   Upper GI bleeding   Hematemesis, GI bleed: Acute/history of alcoholic cirrhosis and esophageal varices: nitial hemoglobin 13.2 which dropped to 10.4 today.  Patient last had endoscopic on 5/4  with Dr. Marca Ancona noting grade III and large esophageal varices for which 6 bands were utilized to completely eradicate varices and a single 4 mm suspected benign papilloma were found.  He underwent another EGD today with eradication of varices and banding.  Continues to be on PPI and octreotide drip.  We will recheck hemoglobin later today and tomorrow morning.  Leukocytosis/lactic acidosis: Acute.  WBC elevated at 12.2 with initial lactic acid 3.8.  Anion gap elevated to 18, but suspect related with lactic acidosis.  Leukocytosis and lactic acidosis both resolved.  Alcohol abuse: Patient reports intermittent quitting drinking alcohol.  However, last few days had been drinking 1/2pint or so.  Alcohol level noted to be 69 on admission. -CIWA protocols with scheduled and as needed Ativan -Continue thiamine, folate, MVI -Counseled on the need of cessation of alcohol use.  No signs of withdrawal at this point in time.  Alcoholic hepatitis: Monitor LFTs.  DVT prophylaxis: SCDs Start: 10/09/19 0951   Code Status: Full Code  Family Communication: None present at bedside.  Plan of care discussed with patient in length and he verbalized understanding and agreed with it.  Status is: Inpatient  Remains inpatient appropriate because:Inpatient level of care appropriate due to severity of illness   Dispo: The patient is from: Home              Anticipated d/c is to: Home              Anticipated d/c date is: 1  day              Patient currently is not medically stable to d/c.        Estimated body mass index is 19.58 kg/m as calculated from the following:   Height as of this encounter: 5\' 7"  (1.702 m).   Weight as of this encounter: 56.7 kg.      Nutritional status:               Consultants:   GI  Procedures:   EGD 10/10/2019  Antimicrobials:  Anti-infectives (From admission, onward)   Start     Dose/Rate Route Frequency Ordered Stop   10/09/19 1000  cefTRIAXone  (ROCEPHIN) 2 g in sodium chloride 0.9 % 100 mL IVPB     Discontinue     2 g 200 mL/hr over 30 Minutes Intravenous Every 24 hours 10/09/19 0955           Subjective: Patient seen and examined after EGD.  He had no complaints.  Objective: Vitals:   10/10/19 0904 10/10/19 0916 10/10/19 0926 10/10/19 1036  BP: (!) 142/96 (!) 140/94 138/89 (!) 130/91  Pulse: 85 83 92 79  Resp: (!) 23 16 (!) 25 18  Temp:    98.8 F (37.1 C)  TempSrc:    Oral  SpO2: 99% 98% 99% 99%  Weight:      Height:        Intake/Output Summary (Last 24 hours) at 10/10/2019 1319 Last data filed at 10/10/2019 0844 Gross per 24 hour  Intake 587.66 ml  Output 0 ml  Net 587.66 ml   Filed Weights   10/09/19 0254 10/09/19 1806  Weight: 65 kg 56.7 kg    Examination:  General exam: Appears calm and comfortable  Respiratory system: Clear to auscultation. Respiratory effort normal. Cardiovascular system: S1 & S2 heard, RRR. No JVD, murmurs, rubs, gallops or clicks. No pedal edema. Gastrointestinal system: Abdomen is nondistended, soft and nontender. No organomegaly or masses felt. Normal bowel sounds heard. Central nervous system: Alert and oriented. No focal neurological deficits. Extremities: Symmetric 5 x 5 power. Skin: No rashes, lesions or ulcers Psychiatry: Judgement and insight appear normal. Mood & affect appropriate.    Data Reviewed: I have personally reviewed following labs and imaging studies  CBC: Recent Labs  Lab 10/09/19 0303 10/09/19 1841 10/10/19 0032  WBC 12.2*  --  9.2  NEUTROABS 9.7*  --   --   HGB 13.2 11.1* 10.4*  HCT 39.6 33.8* 31.4*  MCV 94.5  --  95.2  PLT 294  --  120*   Basic Metabolic Panel: Recent Labs  Lab 10/09/19 0303 10/09/19 1036 10/10/19 0032  NA 139  --  142  K 3.7  --  3.3*  CL 99  --  104  CO2 22  --  27  GLUCOSE 151*  --  106*  BUN 16  --  12  CREATININE 0.81  --  0.92  CALCIUM 8.9  --  7.9*  MG  --  1.1*  --   PHOS  --  2.0*  --     GFR: Estimated Creatinine Clearance: 86.5 mL/min (by C-G formula based on SCr of 0.92 mg/dL). Liver Function Tests: Recent Labs  Lab 10/09/19 0303 10/10/19 0032  AST 43* 36  ALT 19 18  ALKPHOS 49 43  BILITOT 1.3* 1.5*  PROT 7.6 6.5  ALBUMIN 3.6 3.1*   Recent Labs  Lab 10/09/19 0303  LIPASE 23   Recent Labs  Lab 10/09/19 0837  AMMONIA 16   Coagulation Profile: Recent Labs  Lab 10/09/19 0303  INR 1.3*   Cardiac Enzymes: No results for input(s): CKTOTAL, CKMB, CKMBINDEX, TROPONINI in the last 168 hours. BNP (last 3 results) No results for input(s): PROBNP in the last 8760 hours. HbA1C: No results for input(s): HGBA1C in the last 72 hours. CBG: No results for input(s): GLUCAP in the last 168 hours. Lipid Profile: No results for input(s): CHOL, HDL, LDLCALC, TRIG, CHOLHDL, LDLDIRECT in the last 72 hours. Thyroid Function Tests: No results for input(s): TSH, T4TOTAL, FREET4, T3FREE, THYROIDAB in the last 72 hours. Anemia Panel: No results for input(s): VITAMINB12, FOLATE, FERRITIN, TIBC, IRON, RETICCTPCT in the last 72 hours. Sepsis Labs: Recent Labs  Lab 10/09/19 0837 10/10/19 0032  LATICACIDVEN 3.8* 1.5    Recent Results (from the past 240 hour(s))  SARS Coronavirus 2 by RT PCR (hospital order, performed in [facility] hospital lab) Nasopharyngeal Nasopharyngeal Swab     Status: None   Collection Time: 10/09/19 11:14 AM   Specimen: Nasopharyngeal Swab  Result Value Ref Range Status   SARS Coronavirus 2 NEGATIVE NEGATIVE Final    Comment: (NOTE) SARS-CoV-2 target nucleic acids are NOT DETECTED.  The SARS-CoV-2 RNA is generally detectable in upper and lower respiratory specimens during the acute phase of infection. The lowest concentration of SARS-CoV-2 viral copies this assay can detect is 250 copies / mL. A negative result does not preclude SARS-CoV-2 infection and should not be used as the sole basis for treatment or other patient management  decisions.  A negative result may occur with improper specimen collection / handling, submission of specimen other than nasopharyngeal swab, presence of viral mutation(s) within the areas targeted by this assay, and inadequate number of viral copies (<250 copies / mL). A negative result must be combined with clinical observations, patient history, and epidemiological information.  Fact Sheet for Patients:   StrictlyIdeas.no  Fact Sheet for Healthcare Providers: BankingDealers.co.za  This test is not yet approved or  cleared by the [place] FDA and has been authorized for detection and/or diagnosis of SARS-CoV-2 by FDA under an Emergency Use Authorization (EUA).  This EUA will remain in effect (meaning this test can be used) for the duration of the COVID-19 declaration under Section 564(b)(1) of the Act, 21 U.S.C. section 360bbb-3(b)(1), unless the authorization is terminated or revoked sooner.  Performed at [facility], [place] [address] [place], [place] [address]       Radiology Studies: DG CHEST PORT 1 VIEW  Result Date: 10/09/2019 CLINICAL DATA:  Hematemesis EXAM: PORTABLE CHEST 1 VIEW COMPARISON:  08/30/2019 FINDINGS: Normal heart size and mediastinal contours. No acute infiltrate or edema. No effusion or pneumothorax. No acute osseous findings. Artifact from EKG leads IMPRESSION: Negative portable chest. Electronically Signed   By: Monte Fantasia M.D.   On: 10/09/2019 10:26    Scheduled Meds: . folic acid  1 mg Oral Daily  . LORazepam  0-4 mg Intravenous Q6H   Or  . LORazepam  0-4 mg Oral Q6H  . [START ON 10/11/2019] LORazepam  0-4 mg Intravenous Q12H   Or  . [START ON 10/11/2019] LORazepam  0-4 mg Oral Q12H  . multivitamin with minerals  1 tablet Oral Daily  . [START ON 10/12/2019] pantoprazole  40 mg Intravenous Q12H  . potassium chloride  40 mEq Oral Q4H  . sodium chloride flush  3 mL Intravenous Q12H  .  thiamine  100 mg Oral Daily  Or  . thiamine  100 mg Intravenous Daily   Continuous Infusions: . cefTRIAXone (ROCEPHIN)  IV 2 g (10/10/19 1022)  . octreotide  (SANDOSTATIN)    IV infusion 50 mcg/hr (10/10/19 1019)  . pantoprozole (PROTONIX) infusion 8 mg/hr (10/10/19 1020)     LOS: 0 days   Time spent: 33 minutes   Hughie Closs, MD [facility]  10/10/2019, 1:19 PM   To contact the attending provider between 7A-7P or the covering provider during after hours 7P-7A, please log into the web site [email or web address].

## 2019-10-10 NOTE — Plan of Care (Signed)
  Problem: Education: Goal: Knowledge of General Education information will improve Description: Including pain rating scale, medication(s)/side effects and non-pharmacologic comfort measures Outcome: Progressing   Problem: Clinical Measurements: Goal: Will remain free from infection Outcome: Progressing   

## 2019-10-10 NOTE — Transfer of Care (Signed)
Immediate Anesthesia Transfer of Care Note  Patient: [facility]  Procedure(s) Performed: ESOPHAGOGASTRODUODENOSCOPY (EGD) WITH PROPOFOL (N/A ) ESOPHAGEAL BANDING  Patient Location: PACU and Endoscopy Unit  Anesthesia Type:MAC  Level of Consciousness: alert , patient cooperative and responds to stimulation  Airway & Oxygen Therapy: Patient Spontanous Breathing  Post-op Assessment: Report given to RN and Post -op Vital signs reviewed and stable  Post vital signs: Reviewed and stable  Last Vitals:  Vitals Value Taken Time  BP 149/85 10/10/19 0854  Temp    Pulse 104 10/10/19 0856  Resp 18 10/10/19 0856  SpO2 99 % 10/10/19 0856  Vitals shown include unvalidated device data.  Last Pain:  Vitals:   10/10/19 0854  TempSrc: Oral  PainSc: 0-No pain         Complications: No complications documented.

## 2019-10-10 NOTE — Interval H&P Note (Signed)
History and Physical Interval Note: 38/male with alcohol related cirrhosis, presented with hematemesis and melena, he had esophageal varices that were banded in 5/21.  10/10/2019 8:13 AM  [name]  has presented today for EGD, with the diagnosis of varices.  The various methods of treatment have been discussed with the patient and family. After consideration of risks, benefits and other options for treatment, the patient has consented to  Procedure(s): ESOPHAGOGASTRODUODENOSCOPY (EGD) WITH PROPOFOL (N/A) as a surgical intervention.  The patient's history has been reviewed, patient examined, no change in status, stable for surgery.  I have reviewed the patient's chart and labs.  Questions were answered to the patient's satisfaction.     Kerin Salen

## 2019-10-11 LAB — CBC WITH DIFFERENTIAL/PLATELET
Abs Immature Granulocytes: 0.03 10*3/uL (ref 0.00–0.07)
Basophils Absolute: 0.1 10*3/uL (ref 0.0–0.1)
Basophils Relative: 1 %
Eosinophils Absolute: 0.2 10*3/uL (ref 0.0–0.5)
Eosinophils Relative: 4 %
HCT: 29 % — ABNORMAL LOW (ref 39.0–52.0)
Hemoglobin: 9.6 g/dL — ABNORMAL LOW (ref 13.0–17.0)
Immature Granulocytes: 1 %
Lymphocytes Relative: 16 %
Lymphs Abs: 1 10*3/uL (ref 0.7–4.0)
MCH: 31.5 pg (ref 26.0–34.0)
MCHC: 33.1 g/dL (ref 30.0–36.0)
MCV: 95.1 fL (ref 80.0–100.0)
Monocytes Absolute: 0.9 10*3/uL (ref 0.1–1.0)
Monocytes Relative: 13 %
Neutro Abs: 4.3 10*3/uL (ref 1.7–7.7)
Neutrophils Relative %: 65 %
Platelets: 108 10*3/uL — ABNORMAL LOW (ref 150–400)
RBC: 3.05 MIL/uL — ABNORMAL LOW (ref 4.22–5.81)
RDW: 14.2 % (ref 11.5–15.5)
WBC: 6.5 10*3/uL (ref 4.0–10.5)
nRBC: 0 % (ref 0.0–0.2)

## 2019-10-11 LAB — COMPREHENSIVE METABOLIC PANEL
ALT: 16 U/L (ref 0–44)
AST: 34 U/L (ref 15–41)
Albumin: 2.8 g/dL — ABNORMAL LOW (ref 3.5–5.0)
Alkaline Phosphatase: 38 U/L (ref 38–126)
Anion gap: 6 (ref 5–15)
BUN: 8 mg/dL (ref 6–20)
CO2: 27 mmol/L (ref 22–32)
Calcium: 8 mg/dL — ABNORMAL LOW (ref 8.9–10.3)
Chloride: 109 mmol/L (ref 98–111)
Creatinine, Ser: 0.87 mg/dL (ref 0.61–1.24)
GFR calc Af Amer: >60 mL/min (ref >60)
GFR calc non Af Amer: >60 mL/min (ref >60)
Glucose, Bld: 109 mg/dL — ABNORMAL HIGH (ref 70–99)
Potassium: 3.8 mmol/L (ref 3.5–5.1)
Sodium: 142 mmol/L (ref 135–145)
Total Bilirubin: 1.2 mg/dL (ref 0.3–1.2)
Total Protein: 6 g/dL — ABNORMAL LOW (ref 6.5–8.1)

## 2019-10-11 LAB — PHOSPHORUS: Phosphorus: 2.6 mg/dL (ref 2.5–4.6)

## 2019-10-11 LAB — MAGNESIUM: Magnesium: 1.6 mg/dL — ABNORMAL LOW (ref 1.7–2.4)

## 2019-10-11 MED ORDER — MAGNESIUM SULFATE 2 GM/50ML IV SOLN
2.0000 g | Freq: Once | INTRAVENOUS | Status: AC
  Administered 2019-10-11: 2 g via INTRAVENOUS
  Filled 2019-10-11: qty 50

## 2019-10-11 NOTE — Discharge Summary (Signed)
Physician Discharge Summary  Trek Kimball [facility] NFA:[id number] DOB: 1980/05/06 DOA: 10/09/2019  PCP: Farris Has, MD  Admit date: 10/09/2019 Discharge date: 10/11/2019  Admitted From: home Discharge disposition: home   Recommendations for Outpatient Follow-Up:   1. Follow up with GI at [facility] 1-2 weeks for evaluation of gi bleed 2. Take medication as instructed.    Discharge Diagnosis:   Principal Problem:   GI bleed Active Problems:   Alcohol abuse   Lactic acidosis   Hyperbilirubinemia   Hematemesis   Leukocytosis   Elevated liver enzymes   Upper GI bleeding    Discharge Condition: Improved.  Diet recommendation: Low sodium, heart healthy.   Wound care: None.  Code status: Full.   History of Present Illness:   [name] is a 39 y.o. male with medical history significant of alcohol abuse, alcohol-related cirrhosis with esophageal varices, alcoholic hepatitis, and GI bleed previously due to varices presented 6/13 with complaints of vomiting blood around midnight.  He had been drinking half a pint of vodka per day over the previous 3 days or so and had a mixed drink prior to onset of symptoms.  He reported filling up two emesis bags of dark blood with clots in it.  Associated symptoms included  chills, and 1 dark Enrrique Mierzwa tarry stool.  Denied having any chest pain, shortness of breath, fever, or abdominal pain.   Last hospitalized from 5/4-5/7 for upper GI bleed secondary to varices with hemorrhagic shock.  Patient was in the ICU treated with octreotide, Rocephin, and received a total of 2 units of packed red blood cells.  Dr. Marca Ancona of GI performed the EGD requiring 6 bands to eradicate esophageal varices found  On presentation to  emergency department patient was seen to be tachypneic and tachycardic, but all other vital signs maintained.  Labs significant for WBC 12.2, hemoglobin 13.2, sodium 139, CO2 22, anion gap 18, AST 43, ALT 19, total bilirubin 1.3,  alcohol level 69, and INR 1.3.  UA was negative.  Stool guaiacs were noted to be positive.  Dr. Matthias Hughs of gastroenterology was consulted.  Patient was given 40 mg of Protonix IV and started on octreotide drip.  TRH called to admit.    Hospital Course by Problem:   Hematemesis, GI bleed: Acute/history of alcoholic cirrhosis and esophageal varices: initial hemoglobin 13.2 which dropped to 9.6 at discharge..  Patient last had endoscopic on 5/4 with Dr. Marca Ancona noting grade III and large esophageal varices for which 6 bands were utilized to completely eradicate varices and a single 4 mm suspected benign papilloma were found.  He underwent another EGD 6/14 with eradication of varices and banding. recieved PPI and octreotide drip for 24 hours post EGD. tolerating regular diet. Dr Marca Ancona recommends continuing BB and following up with GI at [facility] 1 week for evaluation of bleeding and anemia.   Leukocytosis/lactic acidosis: Acute.  WBC elevated at 12.2 with initial lactic acid 3.8.  Anion gap elevated to 18, but suspect related with lactic acidosis.  Leukocytosis and lactic acidosis both resolved.  Alcohol abuse: Patient reported intermittent quitting drinking alcohol.  However, last few days had been drinking 1/2pint or so.  Alcohol level noted to be 69 on admission. No s/sx of withdrawal during this hospitalization. Counseled on the need of cessation of alcohol use.Marland Kitchen  Alcoholic hepatitis:  LFTs.within limits of normal at discharge    Medical Consultants:   Dr. Marca Ancona gastroenterology   Discharge Exam:  Vitals:   10/11/19 0514 10/11/19 0836  BP: 126/87 121/82  Pulse: 82 69  Resp: 16 18  Temp: 98.2 F (36.8 C) 98.7 F (37.1 C)  SpO2: 97% 98%   Vitals:   10/10/19 1700 10/10/19 2059 10/11/19 0514 10/11/19 0836  BP: 128/88 129/83 126/87 121/82  Pulse: 80 82 82 69  Resp: 18 16 16 18   Temp: 98 F (36.7 C) 98.7 F (37.1 C) 98.2 F (36.8 C) 98.7 F (37.1 C)  TempSrc: Oral Oral Oral Oral   SpO2: 98% 98% 97% 98%  Weight:   57.9 kg   Height:        General exam: Appears calm and comfortable. No acute distress Respiratory system: Clear to auscultation. Respiratory effort normal. Cardiovascular system: S1 & S2 heard, RRR. No JVD,  rubs, gallops or clicks. No murmurs. Gastrointestinal system: Abdomen is nondistended, soft and nontender. No organomegaly or masses felt. Normal bowel sounds heard. Central nervous system: Alert and oriented. No focal neurological deficits. Extremities: No clubbing,  or cyanosis. No edema. Skin: No rashes, lesions or ulcers. Psychiatry: Judgement and insight appear normal. Mood & affect appropriate.    The results of significant diagnostics from this hospitalization (including imaging, microbiology, ancillary and laboratory) are listed below for reference.     Procedures and Diagnostic Studies:   DG CHEST PORT 1 VIEW  Result Date: 10/09/2019 CLINICAL DATA:  Hematemesis EXAM: PORTABLE CHEST 1 VIEW COMPARISON:  08/30/2019 FINDINGS: Normal heart size and mediastinal contours. No acute infiltrate or edema. No effusion or pneumothorax. No acute osseous findings. Artifact from EKG leads IMPRESSION: Negative portable chest. Electronically Signed   By: Monte Fantasia M.D.   On: 10/09/2019 10:26     Labs:   Basic Metabolic Panel: Recent Labs  Lab 10/09/19 0303 10/09/19 0303 10/09/19 1036 10/10/19 0032 10/11/19 0623  NA 139  --   --  142 142  K 3.7   < >  --  3.3* 3.8  CL 99  --   --  104 109  CO2 22  --   --  27 27  GLUCOSE 151*  --   --  106* 109*  BUN 16  --   --  12 8  CREATININE 0.81  --   --  0.92 0.87  CALCIUM 8.9  --   --  7.9* 8.0*  MG  --   --  1.1*  --  1.6*  PHOS  --   --  2.0*  --  2.6   < > = values in this interval not displayed.   GFR Estimated Creatinine Clearance: 93.4 mL/min (by C-G formula based on SCr of 0.87 mg/dL). Liver Function Tests: Recent Labs  Lab 10/09/19 0303 10/10/19 0032 10/11/19 0623  AST 43* 36  34  ALT 19 18 16   ALKPHOS 49 43 38  BILITOT 1.3* 1.5* 1.2  PROT 7.6 6.5 6.0*  ALBUMIN 3.6 3.1* 2.8*   Recent Labs  Lab 10/09/19 0303  LIPASE 23   Recent Labs  Lab 10/09/19 0837  AMMONIA 16   Coagulation profile Recent Labs  Lab 10/09/19 0303  INR 1.3*    CBC: Recent Labs  Lab 10/09/19 0303 10/09/19 1841 10/10/19 0032 10/10/19 1543 10/11/19 0623  WBC 12.2*  --  9.2  --  6.5  NEUTROABS 9.7*  --   --   --  4.3  HGB 13.2 11.1* 10.4* 10.1* 9.6*  HCT 39.6 33.8* 31.4* 30.7* 29.0*  MCV 94.5  --  95.2  --  95.1  PLT 294  --  120*  --  108*   Cardiac Enzymes: No results for input(s): CKTOTAL, CKMB, CKMBINDEX, TROPONINI in the last 168 hours. BNP: Invalid input(s): POCBNP CBG: No results for input(s): GLUCAP in the last 168 hours. D-Dimer No results for input(s): DDIMER in the last 72 hours. Hgb A1c No results for input(s): HGBA1C in the last 72 hours. Lipid Profile No results for input(s): CHOL, HDL, LDLCALC, TRIG, CHOLHDL, LDLDIRECT in the last 72 hours. Thyroid function studies No results for input(s): TSH, T4TOTAL, T3FREE, THYROIDAB in the last 72 hours.  Invalid input(s): FREET3 Anemia work up No results for input(s): VITAMINB12, FOLATE, FERRITIN, TIBC, IRON, RETICCTPCT in the last 72 hours. Microbiology Recent Results (from the past 240 hour(s))  SARS Coronavirus 2 by RT PCR (hospital order, performed in [facility] hospital lab) Nasopharyngeal Nasopharyngeal Swab     Status: None   Collection Time: 10/09/19 11:14 AM   Specimen: Nasopharyngeal Swab  Result Value Ref Range Status   SARS Coronavirus 2 NEGATIVE NEGATIVE Final    Comment: (NOTE) SARS-CoV-2 target nucleic acids are NOT DETECTED.  The SARS-CoV-2 RNA is generally detectable in upper and lower respiratory specimens during the acute phase of infection. The lowest concentration of SARS-CoV-2 viral copies this assay can detect is 250 copies / mL. A negative result does not preclude SARS-CoV-2  infection and should not be used as the sole basis for treatment or other patient management decisions.  A negative result may occur with improper specimen collection / handling, submission of specimen other than nasopharyngeal swab, presence of viral mutation(s) within the areas targeted by this assay, and inadequate number of viral copies (<250 copies / mL). A negative result must be combined with clinical observations, patient history, and epidemiological information.  Fact Sheet for Patients:   BoilerBrush.com.cy  Fact Sheet for Healthcare Providers: https://pope.com/  This test is not yet approved or  cleared by the [place] FDA and has been authorized for detection and/or diagnosis of SARS-CoV-2 by FDA under an Emergency Use Authorization (EUA).  This EUA will remain in effect (meaning this test can be used) for the duration of the COVID-19 declaration under Section 564(b)(1) of the Act, 21 U.S.C. section 360bbb-3(b)(1), unless the authorization is terminated or revoked sooner.  Performed at [facility], [address], [place], [place] [address]      Discharge Instructions:   Discharge Instructions    Diet - low sodium heart healthy   Complete by: As directed    Discharge instructions   Complete by: As directed    Follow up with Gastroenterology at [facility] 1-2 weeks for evaluation of GI bleed Take medications as prescribed.   Increase activity slowly   Complete by: As directed      Allergies as of 10/11/2019      Reactions   Penicillins Hives      Medication List    TAKE these medications   Iron 325 (65 Fe) MG Tabs Take 1 tablet (325 mg total) by mouth daily.   lactulose 10 GM/15ML solution Commonly known as: CHRONULAC Take 45 mLs (30 g total) by mouth 2 (two) times daily.   magnesium oxide 400 (241.3 Mg) MG tablet Commonly known as: MAG-OX Take 1 tablet (400 mg total) by mouth 2 (two) times  daily.   multivitamin with minerals Tabs tablet Take 1 tablet by mouth daily.   nadolol 20 MG tablet Commonly known as: CORGARD Take 1 tablet (20 mg total)  by mouth daily.   omeprazole 20 MG capsule Commonly known as: PRILOSEC Take 1 capsule (20 mg total) by mouth daily.   potassium chloride SA 20 MEQ tablet Commonly known as: KLOR-CON Take 1 tablet (20 mEq total) by mouth daily.   sertraline 100 MG tablet Commonly known as: ZOLOFT Take 1 tablet (100 mg total) by mouth daily.         Time coordinating discharge: 40 minutes  Signed:  Gwenyth Bender NP  [facility] 10/11/2019, 11:13 AM

## 2019-10-11 NOTE — Progress Notes (Signed)
[name] to be discharged Home per MD order. Discussed prescriptions and follow up appointments with the patient. Medication list explained in detail. Patient verbalized understanding.  Skin clean, dry and intact without evidence of skin break down, no evidence of skin tears noted. IV catheter discontinued intact. Site without signs and symptoms of complications. Dressing and pressure applied. Pt denies pain at the site currently. No complaints noted.  Patient free of lines, drains, and wounds.   An After Visit Summary (AVS) was printed and given to the patient. Patient escorted via wheelchair, and discharged home via private auto.  Arvilla Meres, RN

## 2019-10-11 NOTE — Progress Notes (Signed)
[facility] Gastroenterology Progress Note  [name] 38 y.o. 10/23/1980  CC:  Esophageal varices   Subjective: Patient reports feeling well this morning.  He denies any abdominal pain, odynophagia, or chest pain.  He denies any nausea, vomiting, or hematemesis.  He did note a melenic bowel movement this morning.  He tolerated a clear liquid diet.  ROS : Review of Systems  Cardiovascular: Negative for chest pain and palpitations.  Gastrointestinal: Positive for melena. Negative for abdominal pain, blood in stool, constipation, diarrhea, heartburn, nausea and vomiting.   Objective: Vital signs in last 24 hours: Vitals:   10/11/19 0514 10/11/19 0836  BP: 126/87 121/82  Pulse: 82 69  Resp: 16 18  Temp: 98.2 F (36.8 C) 98.7 F (37.1 C)  SpO2: 97% 98%    Physical Exam:  General:  Sleeping but easily awakens to voice alone, oriented, no acute distress  Head:  Normocephalic, without obvious abnormality, atraumatic  Eyes:  Anicteric sclera, EOMs intact  Lungs:   Clear to auscultation bilaterally, respirations unlabored  Heart:  Regular rate and rhythm, S1/S2 normal  Abdomen:   Soft, non-tender, nondistended, normoactive bowel sounds, no guarding or peritoneal signs  Extremities: Extremities normal, atraumatic, no  edema  Pulses: 2+ and symmetric    Lab Results: Recent Labs    10/09/19 0303 10/09/19 1036 10/10/19 0032 10/11/19 0623  NA   < >  --  142 142  K   < >  --  3.3* 3.8  CL   < >  --  104 109  CO2   < >  --  27 27  GLUCOSE   < >  --  106* 109*  BUN   < >  --  12 8  CREATININE   < >  --  0.92 0.87  CALCIUM   < >  --  7.9* 8.0*  MG  --  1.1*  --  1.6*  PHOS  --  2.0*  --  2.6   < > = values in this interval not displayed.   Recent Labs    10/10/19 0032 10/11/19 0623  AST 36 34  ALT 18 16  ALKPHOS 43 38  BILITOT 1.5* 1.2  PROT 6.5 6.0*  ALBUMIN 3.1* 2.8*   Recent Labs    10/09/19 0303 10/09/19 1841 10/10/19 0032 10/10/19 0032 10/10/19 1543  10/11/19 0623  WBC 12.2*  --  9.2  --   --  6.5  NEUTROABS 9.7*  --   --   --   --  4.3  HGB 13.2   < > 10.4*   < > 10.1* 9.6*  HCT 39.6   < > 31.4*   < > 30.7* 29.0*  MCV 94.5  --  95.2  --   --  95.1  PLT 294  --  120*  --   --  108*   < > = values in this interval not displayed.   Recent Labs    10/09/19 0303  LABPROT 15.8*  INR 1.3*    Assessment/Plan: Esophageal varices (Grade III) s/p banding 6/14, completely eradicated.  Portal hypertensive gastropathy also seen on EGD. -Hgb 9.6 today, 10.1 yesterday -LFTs normal T. Bili 1.2/ AST 34/ ALT 16/ ALP 38  If patient tolerates diet with no further signs of bleeding, OK to discharge today.  Discontinue ocreotide at time of discharge.  Continue Protonix 40mg  once daily.  Continue Nadalol 20mg  daily.  Continue lactulose 30g PO BID, titrated for 2-3 soft BMs  daily.  Patient to follow up with [facility] for further management of cirrhosis.  [facility] will sign off. Please contact us if we can be of any further assistance during this hospital stay.  Salley Slaughter PA-C 10/11/2019, 11:16 AM  Contact #  [phone]

## 2019-10-12 ENCOUNTER — Encounter (HOSPITAL_COMMUNITY): Payer: Self-pay | Admitting: Gastroenterology

## 2019-12-04 ENCOUNTER — Emergency Department (HOSPITAL_COMMUNITY): Payer: Self-pay

## 2019-12-04 ENCOUNTER — Encounter (HOSPITAL_COMMUNITY): Payer: Self-pay | Admitting: Emergency Medicine

## 2019-12-04 ENCOUNTER — Inpatient Hospital Stay (HOSPITAL_COMMUNITY)
Admission: EM | Admit: 2019-12-04 | Discharge: 2019-12-08 | DRG: 432 | Disposition: A | Discharge status: Home / Self Care | Payer: Self-pay | Attending: Family Medicine | Admitting: Family Medicine

## 2019-12-04 ENCOUNTER — Other Ambulatory Visit: Payer: Self-pay

## 2019-12-04 DIAGNOSIS — K7460 Unspecified cirrhosis of liver: Secondary | ICD-10-CM

## 2019-12-04 DIAGNOSIS — Z20822 Contact with and (suspected) exposure to covid-19: Secondary | ICD-10-CM | POA: Diagnosis present

## 2019-12-04 DIAGNOSIS — F1010 Alcohol abuse, uncomplicated: Secondary | ICD-10-CM | POA: Diagnosis present

## 2019-12-04 DIAGNOSIS — K922 Gastrointestinal hemorrhage, unspecified: Secondary | ICD-10-CM

## 2019-12-04 DIAGNOSIS — K219 Gastro-esophageal reflux disease without esophagitis: Secondary | ICD-10-CM | POA: Diagnosis present

## 2019-12-04 DIAGNOSIS — R000 Tachycardia, unspecified: Secondary | ICD-10-CM | POA: Diagnosis present

## 2019-12-04 DIAGNOSIS — Z79899 Other long term (current) drug therapy: Secondary | ICD-10-CM

## 2019-12-04 DIAGNOSIS — K7031 Alcoholic cirrhosis of liver with ascites: Principal | ICD-10-CM | POA: Diagnosis present

## 2019-12-04 DIAGNOSIS — Z880 Allergy status to penicillin: Secondary | ICD-10-CM

## 2019-12-04 DIAGNOSIS — K766 Portal hypertension: Secondary | ICD-10-CM | POA: Diagnosis present

## 2019-12-04 DIAGNOSIS — I8511 Secondary esophageal varices with bleeding: Secondary | ICD-10-CM | POA: Diagnosis present

## 2019-12-04 DIAGNOSIS — I864 Gastric varices: Secondary | ICD-10-CM | POA: Diagnosis present

## 2019-12-04 DIAGNOSIS — Z8249 Family history of ischemic heart disease and other diseases of the circulatory system: Secondary | ICD-10-CM

## 2019-12-04 DIAGNOSIS — I8501 Esophageal varices with bleeding: Secondary | ICD-10-CM

## 2019-12-04 DIAGNOSIS — F1020 Alcohol dependence, uncomplicated: Secondary | ICD-10-CM | POA: Diagnosis present

## 2019-12-04 DIAGNOSIS — K3189 Other diseases of stomach and duodenum: Secondary | ICD-10-CM | POA: Diagnosis present

## 2019-12-04 DIAGNOSIS — K7030 Alcoholic cirrhosis of liver without ascites: Secondary | ICD-10-CM | POA: Diagnosis present

## 2019-12-04 DIAGNOSIS — R188 Other ascites: Secondary | ICD-10-CM

## 2019-12-04 DIAGNOSIS — Z87891 Personal history of nicotine dependence: Secondary | ICD-10-CM

## 2019-12-04 LAB — PROTIME-INR
INR: 1.4 — ABNORMAL HIGH (ref 0.8–1.2)
Prothrombin Time: 17 seconds — ABNORMAL HIGH (ref 11.4–15.2)

## 2019-12-04 LAB — COMPREHENSIVE METABOLIC PANEL
ALT: 23 U/L (ref 0–44)
AST: 49 U/L — ABNORMAL HIGH (ref 15–41)
Albumin: 3.8 g/dL (ref 3.5–5.0)
Alkaline Phosphatase: 63 U/L (ref 38–126)
Anion gap: 15 (ref 5–15)
BUN: 8 mg/dL (ref 6–20)
CO2: 30 mmol/L (ref 22–32)
Calcium: 9.7 mg/dL (ref 8.9–10.3)
Chloride: 98 mmol/L (ref 98–111)
Creatinine, Ser: 1 mg/dL (ref 0.61–1.24)
GFR calc Af Amer: >60 mL/min (ref >60)
GFR calc non Af Amer: >60 mL/min (ref >60)
Glucose, Bld: 125 mg/dL — ABNORMAL HIGH (ref 70–99)
Potassium: 3.7 mmol/L (ref 3.5–5.1)
Sodium: 143 mmol/L (ref 135–145)
Total Bilirubin: 1.6 mg/dL — ABNORMAL HIGH (ref 0.3–1.2)
Total Protein: 8.4 g/dL — ABNORMAL HIGH (ref 6.5–8.1)

## 2019-12-04 LAB — CBC WITH DIFFERENTIAL/PLATELET
Abs Immature Granulocytes: 0.08 10*3/uL — ABNORMAL HIGH (ref 0.00–0.07)
Basophils Absolute: 0.2 10*3/uL — ABNORMAL HIGH (ref 0.0–0.1)
Basophils Relative: 1 %
Eosinophils Absolute: 0.1 10*3/uL (ref 0.0–0.5)
Eosinophils Relative: 1 %
HCT: 48.1 % (ref 39.0–52.0)
Hemoglobin: 15.7 g/dL (ref 13.0–17.0)
Immature Granulocytes: 1 %
Lymphocytes Relative: 10 %
Lymphs Abs: 1.3 10*3/uL (ref 0.7–4.0)
MCH: 29.4 pg (ref 26.0–34.0)
MCHC: 32.6 g/dL (ref 30.0–36.0)
MCV: 90.1 fL (ref 80.0–100.0)
Monocytes Absolute: 1.3 10*3/uL — ABNORMAL HIGH (ref 0.1–1.0)
Monocytes Relative: 10 %
Neutro Abs: 9.8 10*3/uL — ABNORMAL HIGH (ref 1.7–7.7)
Neutrophils Relative %: 77 %
Platelets: 252 10*3/uL (ref 150–400)
RBC: 5.34 MIL/uL (ref 4.22–5.81)
RDW: 14.6 % (ref 11.5–15.5)
WBC: 12.8 10*3/uL — ABNORMAL HIGH (ref 4.0–10.5)
nRBC: 0 % (ref 0.0–0.2)

## 2019-12-04 LAB — HEMOGLOBIN AND HEMATOCRIT, BLOOD
HCT: 39.5 % (ref 39.0–52.0)
Hemoglobin: 12.6 g/dL — ABNORMAL LOW (ref 13.0–17.0)

## 2019-12-04 LAB — SARS CORONAVIRUS 2 BY RT PCR (HOSPITAL ORDER, PERFORMED IN ~~LOC~~ HOSPITAL LAB): SARS Coronavirus 2: NEGATIVE

## 2019-12-04 LAB — AMMONIA: Ammonia: 82 umol/L — ABNORMAL HIGH (ref 9–35)

## 2019-12-04 MED ORDER — OCTREOTIDE LOAD VIA INFUSION
50.0000 ug | Freq: Once | INTRAVENOUS | Status: AC
  Administered 2019-12-04: 50 ug via INTRAVENOUS
  Filled 2019-12-04: qty 25

## 2019-12-04 MED ORDER — PANTOPRAZOLE SODIUM 40 MG IV SOLR
40.0000 mg | Freq: Once | INTRAVENOUS | Status: AC
  Administered 2019-12-04: 40 mg via INTRAVENOUS
  Filled 2019-12-04: qty 40

## 2019-12-04 MED ORDER — MAGNESIUM OXIDE 400 (241.3 MG) MG PO TABS
400.0000 mg | ORAL_TABLET | Freq: Two times a day (BID) | ORAL | Status: DC
  Administered 2019-12-04 – 2019-12-08 (×5): 400 mg via ORAL
  Filled 2019-12-04 (×5): qty 1

## 2019-12-04 MED ORDER — SODIUM CHLORIDE 0.9 % IV SOLN
50.0000 ug/h | INTRAVENOUS | Status: DC
  Administered 2019-12-04 – 2019-12-08 (×9): 50 ug/h via INTRAVENOUS
  Filled 2019-12-04 (×11): qty 1

## 2019-12-04 MED ORDER — ONDANSETRON HCL 4 MG/2ML IJ SOLN
4.0000 mg | Freq: Once | INTRAMUSCULAR | Status: AC
  Administered 2019-12-04: 4 mg via INTRAVENOUS
  Filled 2019-12-04: qty 2

## 2019-12-04 MED ORDER — ADULT MULTIVITAMIN W/MINERALS CH
1.0000 | ORAL_TABLET | Freq: Every day | ORAL | Status: DC
  Administered 2019-12-07 – 2019-12-08 (×2): 1 via ORAL
  Filled 2019-12-04 (×2): qty 1

## 2019-12-04 MED ORDER — LACTATED RINGERS IV SOLN: INTRAVENOUS | Status: DC

## 2019-12-04 MED ORDER — SODIUM CHLORIDE 0.9 % IV SOLN
1.0000 g | Freq: Once | INTRAVENOUS | Status: AC
  Administered 2019-12-04: 1 g via INTRAVENOUS
  Filled 2019-12-04: qty 10

## 2019-12-04 MED ORDER — NADOLOL 20 MG PO TABS
20.0000 mg | ORAL_TABLET | Freq: Every day | ORAL | Status: DC
  Administered 2019-12-07 – 2019-12-08 (×2): 20 mg via ORAL
  Filled 2019-12-04 (×4): qty 1

## 2019-12-04 MED ORDER — SERTRALINE HCL 100 MG PO TABS
100.0000 mg | ORAL_TABLET | Freq: Every day | ORAL | Status: DC
  Administered 2019-12-07 – 2019-12-08 (×2): 100 mg via ORAL
  Filled 2019-12-04 (×4): qty 1

## 2019-12-04 MED ORDER — LACTULOSE 10 GM/15ML PO SOLN
15.0000 g | Freq: Two times a day (BID) | ORAL | Status: DC
  Administered 2019-12-04 – 2019-12-08 (×5): 15 g via ORAL
  Filled 2019-12-04 (×9): qty 30

## 2019-12-04 MED ORDER — PROMETHAZINE HCL 25 MG/ML IJ SOLN
25.0000 mg | Freq: Once | INTRAMUSCULAR | Status: AC
  Administered 2019-12-04: 25 mg via INTRAVENOUS
  Filled 2019-12-04: qty 1

## 2019-12-04 MED ORDER — PANTOPRAZOLE SODIUM 40 MG IV SOLR
40.0000 mg | INTRAVENOUS | Status: DC
  Administered 2019-12-05 – 2019-12-08 (×4): 40 mg via INTRAVENOUS
  Filled 2019-12-04 (×4): qty 40

## 2019-12-04 MED ORDER — FERROUS SULFATE 325 (65 FE) MG PO TABS
325.0000 mg | ORAL_TABLET | Freq: Every day | ORAL | Status: DC
  Administered 2019-12-07 – 2019-12-08 (×2): 325 mg via ORAL
  Filled 2019-12-04 (×2): qty 1

## 2019-12-04 MED ORDER — SODIUM CHLORIDE 0.9 % IV BOLUS
1000.0000 mL | Freq: Once | INTRAVENOUS | Status: AC
  Administered 2019-12-04: 1000 mL via INTRAVENOUS

## 2019-12-04 MED ORDER — MAGNESIUM SULFATE 2 GM/50ML IV SOLN
2.0000 g | Freq: Once | INTRAVENOUS | Status: AC
  Administered 2019-12-04: 2 g via INTRAVENOUS
  Filled 2019-12-04: qty 50

## 2019-12-04 NOTE — ED Provider Notes (Signed)
[facility]   CSN: [id number] Arrival date & time: 12/04/19  1903     History Chief Complaint  Patient presents with   GI Bleeding    James BalDaniel W Best is a 39 y.o. male sending for evaluation of hematemesis.  Patient states around 4:00 this afternoon he had his first episode of hematemesis.  He was just sitting there at the time.  Another episode at home, prior to the ER.  He reports a history of upper GI bleed due to esophageal varices.  This is due to alcohol use.  He states he has not had any alcohol in several weeks.  He denies other drug use.  He states he has been taking most of his medications as prescribed, including his Protonix, though he has not yet had it today.  He follows with [facility].  He was last in the hospital several months ago for the same.  He denies fevers, chills, chest pain, shortness breath, abdominal pain, urinary symptoms, abnormal bowel movements.  He is not on blood thinner.  Additional history obtained from chart review.  Patient with a history of upper GI bleed due to esophageal varices.  History of liver cirrhosis, alcohol abuse, GERD.  He has needed blood transfusions in the past due to varices.  HPI     Past Medical History:  Diagnosis Date   Cirrhosis of liver (HCC)    Esophageal varices with hemorrhage (HCC)    ETOH abuse    GERD (gastroesophageal reflux disease)     Patient Active Problem List   Diagnosis Date Noted   Upper GI bleeding 10/10/2019   GI bleed 10/09/2019   Hematemesis 10/09/2019   Leukocytosis 10/09/2019   Elevated liver enzymes 10/09/2019   Elevated lactic acid level 09/02/2019   Bleeding esophageal varices (HCC) 09/02/2019   Shock (HCC) 08/30/2019   Electrolyte disturbance 07/16/2019   Lactic acidosis 07/16/2019   Hyperammonemia (HCC) 07/16/2019   Hyperbilirubinemia 07/16/2019   Tachycardia 11/14/2018   Acute encephalopathy    Acute upper GI  bleed    Ascites    Distended abdomen    Hemorrhagic shock (HCC)    Encounter for palliative care    Goals of care, counseling/discussion    Encephalopathy acute 08/27/2015   Alcoholic cirrhosis (HCC)    Acute hypoxemic respiratory failure (HCC)    SBP (spontaneous bacterial peritonitis) (HCC)    Hypocalcemia 08/21/2015   Hypomagnesemia 08/21/2015   Acute blood loss anemia 08/20/2015   Acute GI bleeding 08/20/2015   Alcohol abuse 08/20/2015   Elevated INR 08/20/2015   Hypoalbuminemia 08/20/2015   Hypokalemia 08/20/2015    Past Surgical History:  Procedure Laterality Date   ESOPHAGEAL BANDING  08/30/2019   Procedure: ESOPHAGEAL BANDING;  Surgeon: Kerin SalenKarki, Arya, MD;  [facility];  Service: Gastroenterology;;   ESOPHAGEAL BANDING  10/10/2019   Procedure: ESOPHAGEAL BANDING;  Surgeon: Kerin SalenKarki, Arya, MD;  [facility];  Service: Gastroenterology;;   ESOPHAGOGASTRODUODENOSCOPY Left 08/21/2015   Procedure: ESOPHAGOGASTRODUODENOSCOPY (EGD);  Surgeon: Charlott RakesVincent Schooler, MD;  [facility];  Service: Endoscopy;  Laterality: Left;   ESOPHAGOGASTRODUODENOSCOPY (EGD) WITH PROPOFOL N/A 08/30/2019   Procedure: ESOPHAGOGASTRODUODENOSCOPY (EGD) WITH PROPOFOL;  Surgeon: Kerin SalenKarki, Arya, MD;  [facility];  Service: Gastroenterology;  Laterality: N/A;   ESOPHAGOGASTRODUODENOSCOPY (EGD) WITH PROPOFOL N/A 10/10/2019   Procedure: ESOPHAGOGASTRODUODENOSCOPY (EGD) WITH PROPOFOL;  Surgeon: Kerin SalenKarki, Arya, MD;  [facility];  Service: Gastroenterology;  Laterality: N/A;       Family  History  Problem Relation Age of Onset   Peptic Ulcer Paternal Aunt    Crohn's disease Maternal Uncle    Heart attack Maternal Uncle    Heart attack Maternal Grandfather    Stroke Maternal Grandmother    Stroke Paternal Grandfather     Social History   Tobacco Use   Smoking status: Former Smoker    Packs/day: 1.00    Types: Cigarettes   Smokeless  tobacco: Never Used  Building services engineer Use: Never used  Substance Use Topics   Alcohol use: Yes    Comment: Alcoholism   Drug use: Yes    Types: Marijuana    Home Medications Prior to Admission medications   Medication Sig Start Date End Date Taking? Authorizing Provider  Ferrous Sulfate (IRON) 325 (65 Fe) MG TABS Take 1 tablet (325 mg total) by mouth daily. 09/02/19  Yes Regalado, Belkys A, MD  lactulose (CHRONULAC) 10 GM/15ML solution Take 45 mLs (30 g total) by mouth 2 (two) times daily. Patient taking differently: Take 15 g by mouth 2 (two) times daily.  09/02/19  Yes Regalado, Belkys A, MD  magnesium oxide (MAG-OX) 400 (241.3 Mg) MG tablet Take 1 tablet (400 mg total) by mouth 2 (two) times daily. 09/02/19  Yes Regalado, Belkys A, MD  Multiple Vitamin (MULTIVITAMIN WITH MINERALS) TABS tablet Take 1 tablet by mouth daily. 07/19/19   Johnson, Clanford L, MD  nadolol (CORGARD) 20 MG tablet Take 1 tablet (20 mg total) by mouth daily. 09/02/19   Regalado, Belkys A, MD  omeprazole (PRILOSEC) 20 MG capsule Take 1 capsule (20 mg total) by mouth daily. 09/02/19   Regalado, Belkys A, MD  potassium chloride SA (KLOR-CON) 20 MEQ tablet Take 1 tablet (20 mEq total) by mouth daily. 09/02/19   Regalado, Belkys A, MD  sertraline (ZOLOFT) 100 MG tablet Take 1 tablet (100 mg total) by mouth daily. 09/02/19   Regalado, Prentiss Bells, MD    Allergies    Penicillins  Review of Systems   Review of Systems  Gastrointestinal: Positive for vomiting.  All other systems reviewed and are negative.   Physical Exam Updated Vital Signs BP (!) 118/97 (BP Location: Left Arm)    Pulse (!) 102    Temp 98.3 F (36.8 C) (Oral)    Resp (!) 26    Ht 5\' 7"  (1.702 m)    Wt 65 kg    SpO2 98%    BMI 22.44 kg/m   Physical Exam Vitals and nursing note reviewed.  Constitutional:      General: He is not in acute distress.    Appearance: He is well-developed. He is diaphoretic.     Comments: Appears pale, intermittently  diaphoretic  HENT:     Head: Normocephalic and atraumatic.  Eyes:     Extraocular Movements: Extraocular movements intact.     Conjunctiva/sclera: Conjunctivae normal.     Pupils: Pupils are equal, round, and reactive to light.  Cardiovascular:     Rate and Rhythm: Normal rate and regular rhythm.     Pulses: Normal pulses.  Pulmonary:     Effort: Pulmonary effort is normal. No respiratory distress.     Breath sounds: Normal breath sounds. No wheezing.  Abdominal:     General: There is no distension.     Palpations: Abdomen is soft. There is no mass.     Tenderness: There is no abdominal tenderness. There is no guarding or rebound.     Comments: No TTP  of the abdomen  Musculoskeletal:        General: Normal range of motion.     Cervical back: Normal range of motion and neck supple.  Skin:    General: Skin is warm.     Capillary Refill: Capillary refill takes less than 2 seconds.  Neurological:     Mental Status: He is alert and oriented to person, place, and time.     ED Results / Procedures / Treatments   Labs (all labs ordered are listed, but only abnormal results are displayed) Labs Reviewed  CBC WITH DIFFERENTIAL/PLATELET - Abnormal; Notable for the following components:      Result Value   WBC 12.8 (*)    Neutro Abs 9.8 (*)    Monocytes Absolute 1.3 (*)    Basophils Absolute 0.2 (*)    Abs Immature Granulocytes 0.08 (*)    All other components within normal limits  COMPREHENSIVE METABOLIC PANEL - Abnormal; Notable for the following components:   Glucose, Bld 125 (*)    Total Protein 8.4 (*)    AST 49 (*)    Total Bilirubin 1.6 (*)    All other components within normal limits  PROTIME-INR - Abnormal; Notable for the following components:   Prothrombin Time 17.0 (*)    INR 1.4 (*)    All other components within normal limits  AMMONIA - Abnormal; Notable for the following components:   Ammonia 82 (*)    All other components within normal limits  SARS CORONAVIRUS  2 BY RT PCR (HOSPITAL ORDER, PERFORMED IN [facility])  HEMOGLOBIN AND HEMATOCRIT, BLOOD  TYPE AND SCREEN    EKG None  Radiology DG Chest Portable 1 View  Result Date: 12/04/2019 CLINICAL DATA:  Multiple episodes of emesis.  Hematemesis. EXAM: PORTABLE CHEST 1 VIEW COMPARISON:  None. FINDINGS: The heart size and mediastinal contours are within normal limits. Both lungs are clear. The visualized skeletal structures are unremarkable. IMPRESSION: No acute cardiopulmonary process. Electronically Signed   By: Genevive Bi M.D.   On: 12/04/2019 20:55    Procedures .Critical Care Performed by: Alveria Apley, PA-C Authorized by: Alveria Apley, PA-C   Critical care provider statement:    Critical care time (minutes):  45   Critical care time was exclusive of:  Separately billable procedures and treating other patients and teaching time   Critical care was necessary to treat or prevent imminent or life-threatening deterioration of the following conditions:  Circulatory failure   Critical care was time spent personally by me on the following activities:  Blood draw for specimens, development of treatment plan with patient or surrogate, discussions with consultants, evaluation of patient's response to treatment, examination of patient, obtaining history from patient or surrogate, ordering and performing treatments and interventions, ordering and review of laboratory studies, ordering and review of radiographic studies, pulse oximetry, re-evaluation of patient's condition and review of old charts   I assumed direction of critical care for this patient from another provider in my specialty: no   Comments:     Pt with upper GI bleed, likely due to variceal bleed, requiring octreotide and admission.    (including critical care time)  Medications Ordered in ED Medications  magnesium sulfate IVPB 2 g 50 mL (2 g Intravenous New Bag/Given 12/04/19 2133)  octreotide (SANDOSTATIN) 2  mcg/mL load via infusion 50 mcg (has no administration in time range)    And  octreotide (SANDOSTATIN) 500 mcg in sodium chloride 0.9 % 250 mL (2  mcg/mL) infusion (has no administration in time range)  cefTRIAXone (ROCEPHIN) 1 g in sodium chloride 0.9 % 100 mL IVPB (has no administration in time range)  pantoprazole (PROTONIX) injection 40 mg (40 mg Intravenous Given 12/04/19 2018)  ondansetron (ZOFRAN) injection 4 mg (4 mg Intravenous Given 12/04/19 2018)  sodium chloride 0.9 % bolus 1,000 mL (1,000 mLs Intravenous New Bag/Given 12/04/19 2130)  promethazine (PHENERGAN) injection 25 mg (25 mg Intravenous Given 12/04/19 2131)    ED Course  I have reviewed the triage vital signs and the nursing notes.  Pertinent labs & imaging results that were available during my care of the patient were reviewed by me and considered in my medical decision making (see chart for details).    MDM Rules/Calculators/A&P                          Patient presented for evaluation of hematemesis.  On exam, patient appears pale, intermittent diaphoretic.  Not actively vomiting on my evaluation.  He has no abdominal tenderness.  History esophageal varices and alcohol use, likely the cause for her symptoms today.  Will obtain labs including PT/INR and hemoglobin.  Battle signs are stable on my exam.  PPI and Zofran given.  On reassessment, patient reports no vomiting or hematemesis in the past hour.  We will continue to monitor.  Labs overall reassuring.  Hemoglobin stable.  INR 1.4.  Will admit to medicine and consult with GI.  Patient had another episode of hematemesis.  Will start octreotide, Rocephin, give fluids due to cramping.  Discussed with Dr. Ewing Schlein from GI, they will consult.  No further recs at this time.  Discussed with DR. Chotiner from [facility] hospitalist service, pt to be admitted.   Final Clinical Impression(s) / ED Diagnoses Final diagnoses:  Upper GI bleed    Rx / DC Orders ED Discharge Orders     None       Alveria Apley, PA-C 12/04/19 2148    Raeford Razor, MD 12/05/19 0009

## 2019-12-04 NOTE — H&P (Signed)
History and Physical    James BalDaniel W Best ZOX:[id number] DOB: [birth year] DOA: 12/04/2019  PCP: Farris HasMorrow, Aaron, MD   Patient coming from:    Home  Chief Complaint: Vomiting blood  HPI: [name] is a 39 y.o. male with medical history significant for alcoholic cirrhosis, esophageal varices, GERD, previous blood transfusion secondary to esophageal variceal bleeding.  [name] states that at 4 pm he started to have hematemesis. He was just sitting at home when the symptoms spontaneously started.  Second episode of hematemesis at home and decide then come to the emergency room.  In the emergency room he has had 3 further episodes of hematemesis.  He states he is filled out for emesis bags with bright red blood. He reports a history of upper GI bleed due to esophageal varices.  This is due to alcohol use.  He states he has not had any alcohol in several weeks.  He denies other drug use.  He states he has been taking most of his medications as prescribed, including his Protonix, though he has not yet had it today.  He follows with [facility].  He was last in the hospital several months ago for the same.  He denies fevers, chills, chest pain, shortness breath, abdominal pain, urinary symptoms, abnormal bowel movements.  He is not on blood thinner.  Denies any recent abdominal trauma.  ED Course: Hemodynamically stable in the emergency room other than some mild tachycardia.  He has been given IV fluids.  He has been given octreotide and IV Protonix.  Case was discussed with gastroenterology by the ER provider and gastroenterology recommended admission to continue current care.  Review of Systems:  General: Denies weakness, fever, chills, weight loss, night sweats.  Denies dizziness.  Denies change in appetite HENT: Denies head trauma, headache, denies change in hearing, tinnitus.  Denies nasal congestion or bleeding.  Denies sore throat, sores in mouth.  Denies difficulty swallowing Eyes: Denies  blurry vision, pain in eye, drainage.  Denies discoloration of eyes. Neck: Denies pain.  Denies swelling.  Denies pain with movement. Cardiovascular: Denies chest pain, palpitations.  Denies edema.  Denies orthopnea Respiratory: Denies shortness of breath, cough.  Denies wheezing.  Denies sputum production Gastrointestinal: Denies abdominal pain, swelling.  Denies nausea, vomiting, diarrhea.  Denies melena. reports multiple bouts of hematemesis. Musculoskeletal: Denies limitation of movement.  Denies deformity or swelling.  Denies pain.  Denies arthralgias or myalgias. Genitourinary: Denies pelvic pain.  Denies urinary frequency or hesitancy.  Denies dysuria.  Skin: Denies rash.  Denies petechiae, purpura, ecchymosis. Neurological: Denies headache.  Denies syncope.  Denies seizure activity.  Denies weakness or paresthesia.  Denies slurred speech, drooping face.  Denies visual change. Psychiatric: Denies depression, anxiety.  Denies suicidal thoughts or ideation.  Denies hallucinations.  Past Medical History:  Diagnosis Date  . Cirrhosis of liver (HCC)   . Esophageal varices with hemorrhage (HCC)   . ETOH abuse   . GERD (gastroesophageal reflux disease)     Past Surgical History:  Procedure Laterality Date  . ESOPHAGEAL BANDING  08/30/2019   Procedure: ESOPHAGEAL BANDING;  Surgeon: Kerin SalenKarki, Arya, MD;  [facility];  Service: Gastroenterology;;  . ESOPHAGEAL BANDING  10/10/2019   Procedure: ESOPHAGEAL BANDING;  Surgeon: Kerin SalenKarki, Arya, MD;  [facility];  Service: Gastroenterology;;  . ESOPHAGOGASTRODUODENOSCOPY Left 08/21/2015   Procedure: ESOPHAGOGASTRODUODENOSCOPY (EGD);  Surgeon: Charlott RakesVincent Schooler, MD;  [facility];  Service: Endoscopy;  Laterality: Left;  . ESOPHAGOGASTRODUODENOSCOPY (EGD) WITH PROPOFOL N/A  08/30/2019   Procedure: ESOPHAGOGASTRODUODENOSCOPY (EGD) WITH PROPOFOL;  Surgeon: Kerin Salen, MD;  Location: [facility] ENDOSCOPY;  Service: Gastroenterology;   Laterality: N/A;  . ESOPHAGOGASTRODUODENOSCOPY (EGD) WITH PROPOFOL N/A 10/10/2019   Procedure: ESOPHAGOGASTRODUODENOSCOPY (EGD) WITH PROPOFOL;  Surgeon: Kerin Salen, MD;  Location: [facility] ENDOSCOPY;  Service: Gastroenterology;  Laterality: N/A;    Social History  reports that he has quit smoking. His smoking use included cigarettes. He smoked 1.00 pack per day. He has never used smokeless tobacco. He reports current alcohol use. He reports current drug use. Drug: Marijuana.  Allergies  Allergen Reactions  . Penicillins Hives    Family History  Problem Relation Age of Onset  . Peptic Ulcer Paternal Aunt   . Crohn's disease Maternal Uncle   . Heart attack Maternal Uncle   . Heart attack Maternal Grandfather   . Stroke Maternal Grandmother   . Stroke Paternal Grandfather      Prior to Admission medications   Medication Sig Start Date End Date Taking? Authorizing Provider  Ferrous Sulfate (IRON) 325 (65 Fe) MG TABS Take 1 tablet (325 mg total) by mouth daily. 09/02/19  Yes Regalado, Belkys A, MD  lactulose (CHRONULAC) 10 GM/15ML solution Take 45 mLs (30 g total) by mouth 2 (two) times daily. Patient taking differently: Take 15 g by mouth 2 (two) times daily.  09/02/19  Yes Regalado, Belkys A, MD  magnesium oxide (MAG-OX) 400 (241.3 Mg) MG tablet Take 1 tablet (400 mg total) by mouth 2 (two) times daily. 09/02/19  Yes Regalado, Belkys A, MD  Multiple Vitamin (MULTIVITAMIN WITH MINERALS) TABS tablet Take 1 tablet by mouth daily. 07/19/19  Yes Johnson, Clanford L, MD  nadolol (CORGARD) 20 MG tablet Take 1 tablet (20 mg total) by mouth daily. 09/02/19  Yes Regalado, Belkys A, MD  omeprazole (PRILOSEC OTC) 20 MG tablet Take 20 mg by mouth daily before breakfast.   Yes [provider]  potassium chloride SA (KLOR-CON) 20 MEQ tablet Take 1 tablet (20 mEq total) by mouth daily. 09/02/19  Yes Regalado, Belkys A, MD  sertraline (ZOLOFT) 100 MG tablet Take 1 tablet (100 mg total) by mouth daily. 09/02/19   Yes Regalado, Belkys A, MD  omeprazole (PRILOSEC) 20 MG capsule Take 1 capsule (20 mg total) by mouth daily. Patient not taking: Reported on 12/04/2019 09/02/19   Alba Cory, MD    Physical Exam: Vitals:   12/04/19 1951 12/04/19 2038 12/04/19 2041 12/04/19 2200  BP:  115/88 (!) 118/97 113/90  Pulse:  92 (!) 102 78  Resp:  20 (!) 26 20  Temp:   98.3 F (36.8 C)   TempSrc:   Oral   SpO2:  95% 98% 96%  Weight: 65 kg     Height: 5\' 7"  (1.702 m)       Constitutional: NAD, calm, comfortable Vitals:   12/04/19 1951 12/04/19 2038 12/04/19 2041 12/04/19 2200  BP:  115/88 (!) 118/97 113/90  Pulse:  92 (!) 102 78  Resp:  20 (!) 26 20  Temp:   98.3 F (36.8 C)   TempSrc:   Oral   SpO2:  95% 98% 96%  Weight: 65 kg     Height: 5\' 7"  (1.702 m)      General: WDWN, Alert and oriented x3.  Eyes: EOMI, PERRL, lids and conjunctivae normal.  Sclera nonicteric HENT:  Gate/AT, external ears normal.  Nares patent without epistasis.  Mucous membranes are moist. Posterior pharynx clear of any exudate or lesions.  Neck: Soft,  normal range of motion, supple, no masses, no thyromegaly.  Trachea midline Respiratory: clear to auscultation bilaterally, no wheezing, no crackles. Normal respiratory effort. No accessory muscle use.  Cardiovascular: Regular rate and rhythm, no murmurs / rubs / gallops. No extremity edema. 2+ pedal pulses.   Abdomen: Soft, no tenderness, nondistended, no rebound or guarding.  No masses palpated.  Mild hepatomegaly with liver edge palpated 2 fingerbreadths below costal margin.  Bowel sounds normoactive Musculoskeletal: FROM. no cyanosis. No joint deformity upper and lower extremities. no contractures. Normal muscle tone.  Skin: Warm, dry, intact no rashes, lesions, ulcers. No induration Neurologic: CN 2-12 grossly intact.  Normal speech.  Sensation intact, patella DTR +1 bilaterally. Strength 5/5 in all extremities.   Psychiatric: Normal judgment and insight.  Normal mood.     Labs on Admission: I have personally reviewed following labs and imaging studies  CBC: Recent Labs  Lab 12/04/19 1959  WBC 12.8*  NEUTROABS 9.8*  HGB 15.7  HCT 48.1  MCV 90.1  PLT 252    Basic Metabolic Panel: Recent Labs  Lab 12/04/19 1959  NA 143  K 3.7  CL 98  CO2 30  GLUCOSE 125*  BUN 8  CREATININE 1.00  CALCIUM 9.7    GFR: Estimated Creatinine Clearance: 91.2 mL/min (by C-G formula based on SCr of 1 mg/dL).  Liver Function Tests: Recent Labs  Lab 12/04/19 1959  AST 49*  ALT 23  ALKPHOS 63  BILITOT 1.6*  PROT 8.4*  ALBUMIN 3.8    Urine analysis:    Component Value Date/Time   COLORURINE YELLOW 10/09/2019 0300   APPEARANCEUR CLEAR 10/09/2019 0300   LABSPEC 1.028 10/09/2019 0300   PHURINE 6.0 10/09/2019 0300   GLUCOSEU NEGATIVE 10/09/2019 0300   HGBUR NEGATIVE 10/09/2019 0300   BILIRUBINUR NEGATIVE 10/09/2019 0300   KETONESUR 20 (A) 10/09/2019 0300   PROTEINUR 30 (A) 10/09/2019 0300   NITRITE NEGATIVE 10/09/2019 0300   LEUKOCYTESUR NEGATIVE 10/09/2019 0300    Radiological Exams on Admission: DG Chest Portable 1 View  Result Date: 12/04/2019 CLINICAL DATA:  Multiple episodes of emesis.  Hematemesis. EXAM: PORTABLE CHEST 1 VIEW COMPARISON:  None. FINDINGS: The heart size and mediastinal contours are within normal limits. Both lungs are clear. The visualized skeletal structures are unremarkable. IMPRESSION: No acute cardiopulmonary process. Electronically Signed   By: Genevive Bi M.D.   On: 12/04/2019 20:55    EKG: Independently reviewed.  Normal sinus rhythm.  Nonspecific ST changes.  Normal QTc  Assessment/Plan Active Problems:   Acute GI bleeding Mr. Schnelle is admitted to medical telemetry floor. Multiple episodes of hematemesis while in the emergency room.  Hemodynamically he is stable.  He does have mild tachycardia. Continue IV fluid hydration. Monitor H&H every 6 hours. Patient started on Protonix 40 mg IV daily.  Versus were  given in the emergency room. Patient started on octreotide as he has history of bleeding esophageal varices Gastroenterology was consulted and will see patient in the morning to evaluate for possible endoscopic evaluation and intervention    Alcoholic cirrhosis (HCC) Chronic    Bleeding esophageal varices [facility]) Gastroenterology consulted.  Octreotide therapy as above.  PPI therapy as above.    Tachycardia IV fluid hydration provided.  We will continue to monitor on telemetry.    Alcohol abuse With chronic alcoholism.  He does have known alcoholic cirrhosis.  Consult discharge planning for assistance with outpatient alcohol rehabilitation programs for patient     DVT prophylaxis: Padua score low.  TED hose and ambulation for DVT prophylaxis Code Status:   Full code Family Communication:  Diagnosis and plan discussed with patient.  Patient verbalized understanding agrees with plan.  Further recommendation to follow as clinical indicated Disposition Plan:   Patient is from:  Home  Anticipated DC to:  Home  Anticipated DC date:  Anticipate greater than 2 midnight stay in the hospital to treat acute medical condition  Anticipated DC barriers: No barriers to discharge identified at this time  Consults called:  Gastroenterology Admission status:  Inpatient  Severity of Illness: The appropriate patient status for this patient is INPATIENT. Inpatient status is judged to be reasonable and necessary in order to provide the required intensity of service to ensure the patient's safety. The patient's presenting symptoms, physical exam findings, and initial radiographic and laboratory data in the context of their chronic comorbidities is felt to place them at high risk for further clinical deterioration. Furthermore, it is not anticipated that the patient will be medically stable for discharge from the hospital within 2 midnights of admission. The following factors support the patient status of  inpatient.   " The patient's presenting symptoms include multiple episodes of hematemesis. " The worrisome physical exam findings include mild tachycardia. " The initial radiographic and laboratory data are stable at this time. " The chronic co-morbidities include cirrhosis.   * I certify that at the point of admission it is my clinical judgment that the patient will require inpatient hospital care spanning beyond 2 midnights from the point of admission due to high intensity of service, high risk for further deterioration and high frequency of surveillance required.Claudean Severance Jeffey Janssen MD [facility]  How to contact the [facility] Attending or Consulting provider 7A - 7P or covering provider during after hours 7P -7A, for this patient?   1. Check the care team in [facility] and look for a) attending/consulting [facility] provider listed and b) the [facility] team listed 2. Log into [email or web address] and use [place]'s universal password to access. If you do not have the password, please contact the hospital operator. 3. Locate the [facility] provider you are looking for under [facility] and page to a number that you can be directly reached. 4. If you still have difficulty reaching the provider, please page the [facility] (Director on Call) for the Hospitalists listed on amion for assistance.  12/04/2019, 11:00 PM

## 2019-12-04 NOTE — ED Triage Notes (Signed)
Patient reports multiple bloody emesis today with large amount of blood , near syncope at triage with diaphoresis/pallor.

## 2019-12-05 ENCOUNTER — Inpatient Hospital Stay (HOSPITAL_COMMUNITY): Payer: Self-pay | Admitting: Certified Registered Nurse Anesthetist

## 2019-12-05 ENCOUNTER — Encounter (HOSPITAL_COMMUNITY)
Admission: EM | Disposition: A | Discharge status: Home / Self Care | Payer: Self-pay | Source: Home / Self Care | Attending: Family Medicine

## 2019-12-05 HISTORY — PX: GASTRIC VARICES BANDING: SHX5519

## 2019-12-05 HISTORY — PX: HOT HEMOSTASIS: SHX5433

## 2019-12-05 HISTORY — PX: ESOPHAGOGASTRODUODENOSCOPY: SHX5428

## 2019-12-05 LAB — CBC
HCT: 37.9 % — ABNORMAL LOW (ref 39.0–52.0)
Hemoglobin: 12.2 g/dL — ABNORMAL LOW (ref 13.0–17.0)
MCH: 30.2 pg (ref 26.0–34.0)
MCHC: 32.2 g/dL (ref 30.0–36.0)
MCV: 93.8 fL (ref 80.0–100.0)
Platelets: 127 10*3/uL — ABNORMAL LOW (ref 150–400)
RBC: 4.04 MIL/uL — ABNORMAL LOW (ref 4.22–5.81)
RDW: 14.7 % (ref 11.5–15.5)
WBC: 11 10*3/uL — ABNORMAL HIGH (ref 4.0–10.5)
nRBC: 0 % (ref 0.0–0.2)

## 2019-12-05 LAB — HEMOGLOBIN AND HEMATOCRIT, BLOOD
HCT: 34 % — ABNORMAL LOW (ref 39.0–52.0)
HCT: 31.1 % — ABNORMAL LOW (ref 39.0–52.0)
Hemoglobin: 10.6 g/dL — ABNORMAL LOW (ref 13.0–17.0)
Hemoglobin: 9.8 g/dL — ABNORMAL LOW (ref 13.0–17.0)

## 2019-12-05 LAB — COMPREHENSIVE METABOLIC PANEL
ALT: 18 U/L (ref 0–44)
AST: 35 U/L (ref 15–41)
Albumin: 2.8 g/dL — ABNORMAL LOW (ref 3.5–5.0)
Alkaline Phosphatase: 47 U/L (ref 38–126)
Anion gap: 13 (ref 5–15)
BUN: 12 mg/dL (ref 6–20)
CO2: 28 mmol/L (ref 22–32)
Calcium: 8 mg/dL — ABNORMAL LOW (ref 8.9–10.3)
Chloride: 103 mmol/L (ref 98–111)
Creatinine, Ser: 0.88 mg/dL (ref 0.61–1.24)
GFR calc Af Amer: >60 mL/min (ref >60)
GFR calc non Af Amer: >60 mL/min (ref >60)
Glucose, Bld: 135 mg/dL — ABNORMAL HIGH (ref 70–99)
Potassium: 3.6 mmol/L (ref 3.5–5.1)
Sodium: 144 mmol/L (ref 135–145)
Total Bilirubin: 1.4 mg/dL — ABNORMAL HIGH (ref 0.3–1.2)
Total Protein: 6.4 g/dL — ABNORMAL LOW (ref 6.5–8.1)

## 2019-12-05 LAB — HIV ANTIBODY (ROUTINE TESTING W REFLEX): HIV Screen 4th Generation wRfx: NONREACTIVE

## 2019-12-05 SURGERY — EGD (ESOPHAGOGASTRODUODENOSCOPY)
Anesthesia: Monitor Anesthesia Care

## 2019-12-05 MED ORDER — PROPOFOL 10 MG/ML IV BOLUS
INTRAVENOUS | Status: DC | PRN
  Administered 2019-12-05: 30 mg via INTRAVENOUS
  Administered 2019-12-05: 20 mg via INTRAVENOUS
  Administered 2019-12-05 (×2): 30 mg via INTRAVENOUS
  Administered 2019-12-05: 20 mg via INTRAVENOUS
  Administered 2019-12-05 (×2): 30 mg via INTRAVENOUS

## 2019-12-05 MED ORDER — SODIUM CHLORIDE 0.9 % IV SOLN: INTRAVENOUS | Status: DC

## 2019-12-05 MED ORDER — PHENYLEPHRINE HCL (PRESSORS) 10 MG/ML IV SOLN
INTRAVENOUS | Status: DC | PRN
Start: 2019-12-05 — End: 2019-12-05
  Administered 2019-12-05 (×2): 80 ug via INTRAVENOUS

## 2019-12-05 MED ORDER — SODIUM CHLORIDE 0.9 % IV SOLN
2.0000 g | INTRAVENOUS | Status: DC
  Administered 2019-12-05 – 2019-12-07 (×3): 2 g via INTRAVENOUS
  Filled 2019-12-05 (×4): qty 20

## 2019-12-05 MED ORDER — PROPOFOL 500 MG/50ML IV EMUL
INTRAVENOUS | Status: DC | PRN
  Administered 2019-12-05: 100 ug/kg/min via INTRAVENOUS

## 2019-12-05 MED ORDER — LACTATED RINGERS IV SOLN
INTRAVENOUS | Status: DC | PRN
Start: 2019-12-05 — End: 2019-12-05

## 2019-12-05 MED ORDER — LIDOCAINE HCL (CARDIAC) PF 100 MG/5ML IV SOSY
PREFILLED_SYRINGE | INTRAVENOUS | Status: DC | PRN
  Administered 2019-12-05: 80 mg via INTRATRACHEAL

## 2019-12-05 NOTE — Anesthesia Procedure Notes (Signed)
Procedure Name: MAC Date/Time: 12/05/2019 10:45 AM Performed by: Kathryne Hitch, CRNA Pre-anesthesia Checklist: Patient identified, Emergency Drugs available, Suction available and Patient being monitored Patient Re-evaluated:Patient Re-evaluated prior to induction Oxygen Delivery Method: Nasal cannula Preoxygenation: Pre-oxygenation with 100% oxygen Induction Type: IV induction Dental Injury: Teeth and Oropharynx as per pre-operative assessment

## 2019-12-05 NOTE — ED Notes (Signed)
PT transported to Endo lab by Endo staff via stretcher

## 2019-12-05 NOTE — Anesthesia Preprocedure Evaluation (Addendum)
Anesthesia Evaluation  Patient identified by MRN, date of birth, ID band Patient awake    Reviewed: Allergy & Precautions, NPO status , Patient's Chart, lab work & pertinent test results  History of Anesthesia Complications Negative for: history of anesthetic complications  Airway Mallampati: II  TM Distance: >3 FB Neck ROM: Full    Dental  (+) Poor Dentition, Dental Advisory Given   Pulmonary neg shortness of breath, neg COPD, neg recent URI, former smoker,    breath sounds clear to auscultation       Cardiovascular  Rhythm:Regular     Neuro/Psych PSYCHIATRIC DISORDERS negative neurological ROS     GI/Hepatic GERD  Medicated,(+) Cirrhosis       , ? GI bleed   Endo/Other    Renal/GU      Musculoskeletal   Abdominal   Peds  Hematology  (+) Blood dyscrasia, anemia ,   Anesthesia Other Findings   Reproductive/Obstetrics                            Anesthesia Physical Anesthesia Plan  ASA: II  Anesthesia Plan: MAC   Post-op Pain Management:    Induction:   PONV Risk Score and Plan: 1 and Treatment may vary due to age or medical condition  Airway Management Planned: Nasal Cannula  Additional Equipment: None  Intra-op Plan:   Post-operative Plan:   Informed Consent: I have reviewed the patients History and Physical, chart, labs and discussed the procedure including the risks, benefits and alternatives for the proposed anesthesia with the patient or authorized representative who has indicated his/her understanding and acceptance.     Dental advisory given  Plan Discussed with: CRNA and Surgeon  Anesthesia Plan Comments:         Anesthesia Quick Evaluation

## 2019-12-05 NOTE — ED Notes (Signed)
Notified Dr. Lucianne Muss about bloody stools x 2 (approx 75cc each), via secure chat, instructed to contact IR. IR radiologist paged.

## 2019-12-05 NOTE — Consult Note (Addendum)
Chief Complaint: Patient was seen in consultation today for possible TIPS procedure Chief Complaint  Patient presents with  . GI Bleeding   at the request of Dr Boyd Kerbs   Supervising Physician: Irish Lack  Patient Status: [facility] - ED  History of Present Illness: [name] is a 39 y.o. male   Alcoholic cirrhosis Esophageal varices Upper GI bleeding To ED last night with same--hematemesis almost 10 episodes since 12/04/19 pm Bright red blood Has had 3 banding procedure previously-- 2017-2021 New banding procedure today  Also noted is red stool yesterday BMs are always tarry, runny and brown (on Lactulose)  TODAY PROCEDURE:  Procedure(s): ESOPHAGOGASTRODUODENOSCOPY (EGD) (N/A) HOT HEMOSTASIS (ARGON PLASMA COAGULATION/BICAP) (N/A) GASTRIC VARICES BANDING  SURGEON:  Surgeon(s) and Role:    * Brahmbhatt, Parag, MD - Primary Findings ------------ -Actively bleeding esophageal varices treated with 5 bands placement. -Bleeding from portal hypertensive gastropathy treated with APC.  Intervention radiology consult for evaluation of TIPS if ongoing bleeding   Has had another dark stool with" red blood on stool" in ED room just little bit ago - maybe 100 pm today 12 noon today Hg 10.6 (15.7 last night); No Tx yet No hematemesis since this am  Discussed TIPs procedure with pt as per Dr Levora Angel request    Past Medical History:  Diagnosis Date  . Cirrhosis of liver (HCC)   . Esophageal varices with hemorrhage (HCC)   . ETOH abuse   . GERD (gastroesophageal reflux disease)     Past Surgical History:  Procedure Laterality Date  . ESOPHAGEAL BANDING  08/30/2019   Procedure: ESOPHAGEAL BANDING;  Surgeon: Kerin Salen, MD;  Location: [facility] ENDOSCOPY;  Service: Gastroenterology;;  . ESOPHAGEAL BANDING  10/10/2019   Procedure: ESOPHAGEAL BANDING;  Surgeon: Kerin Salen, MD;  Location: [facility] ENDOSCOPY;  Service: Gastroenterology;;  . ESOPHAGOGASTRODUODENOSCOPY  Left 08/21/2015   Procedure: ESOPHAGOGASTRODUODENOSCOPY (EGD);  Surgeon: Charlott Rakes, MD;  Location: [facility] ENDOSCOPY;  Service: Endoscopy;  Laterality: Left;  . ESOPHAGOGASTRODUODENOSCOPY (EGD) WITH PROPOFOL N/A 08/30/2019   Procedure: ESOPHAGOGASTRODUODENOSCOPY (EGD) WITH PROPOFOL;  Surgeon: Kerin Salen, MD;  Location: [facility] ENDOSCOPY;  Service: Gastroenterology;  Laterality: N/A;  . ESOPHAGOGASTRODUODENOSCOPY (EGD) WITH PROPOFOL N/A 10/10/2019   Procedure: ESOPHAGOGASTRODUODENOSCOPY (EGD) WITH PROPOFOL;  Surgeon: Kerin Salen, MD;  Location: [facility] ENDOSCOPY;  Service: Gastroenterology;  Laterality: N/A;    Allergies: Penicillins  Medications: Prior to Admission medications   Medication Sig Start Date End Date Taking? Authorizing Provider  Ferrous Sulfate (IRON) 325 (65 Fe) MG TABS Take 1 tablet (325 mg total) by mouth daily. 09/02/19  Yes Regalado, Belkys A, MD  lactulose (CHRONULAC) 10 GM/15ML solution Take 45 mLs (30 g total) by mouth 2 (two) times daily. Patient taking differently: Take 15 g by mouth 2 (two) times daily.  09/02/19  Yes Regalado, Belkys A, MD  magnesium oxide (MAG-OX) 400 (241.3 Mg) MG tablet Take 1 tablet (400 mg total) by mouth 2 (two) times daily. 09/02/19  Yes Regalado, Belkys A, MD  Multiple Vitamin (MULTIVITAMIN WITH MINERALS) TABS tablet Take 1 tablet by mouth daily. 07/19/19  Yes Johnson, Clanford L, MD  nadolol (CORGARD) 20 MG tablet Take 1 tablet (20 mg total) by mouth daily. 09/02/19  Yes Regalado, Belkys A, MD  omeprazole (PRILOSEC OTC) 20 MG tablet Take 20 mg by mouth daily before breakfast.   Yes [provider]  potassium chloride SA (KLOR-CON) 20 MEQ tablet Take 1 tablet (20 mEq total) by mouth daily. 09/02/19  Yes Regalado, Prentiss Bells, MD  sertraline (ZOLOFT) 100 MG tablet Take 1 tablet (100 mg total) by mouth daily. 09/02/19  Yes Regalado, Belkys A, MD  omeprazole (PRILOSEC) 20 MG capsule Take 1 capsule (20 mg total) by mouth daily. Patient not taking: Reported on  12/04/2019 09/02/19   Alba Cory, MD     Family History  Problem Relation Age of Onset  . Peptic Ulcer Paternal Aunt   . Crohn's disease Maternal Uncle   . Heart attack Maternal Uncle   . Heart attack Maternal Grandfather   . Stroke Maternal Grandmother   . Stroke Paternal Grandfather     Social History   Socioeconomic History  . Marital status: Single    Spouse name: Not on file  . Number of children: Not on file  . Years of education: Not on file  . Highest education level: Not on file  Occupational History  . Not on file  Tobacco Use  . Smoking status: Former Smoker    Packs/day: 1.00    Types: Cigarettes  . Smokeless tobacco: Never Used  Vaping Use  . Vaping Use: Never used  Substance and Sexual Activity  . Alcohol use: Yes    Comment: Alcoholism  . Drug use: Yes    Types: Marijuana  . Sexual activity: Not on file  Other Topics Concern  . Not on file  Social History Narrative  . Not on file   Social Determinants of Health   Financial Resource Strain:   . Difficulty of Paying Living Expenses:   Food Insecurity:   . Worried About Programme researcher, broadcasting/film/video in the Last Year:   . Barista in the Last Year:   Transportation Needs:   . Freight forwarder (Medical):   Marland Kitchen Lack of Transportation (Non-Medical):   Physical Activity:   . Days of Exercise per Week:   . Minutes of Exercise per Session:   Stress:   . Feeling of Stress :   Social Connections:   . Frequency of Communication with Friends and Family:   . Frequency of Social Gatherings with Friends and Family:   . Attends Religious Services:   . Active Member of Clubs or Organizations:   . Attends Banker Meetings:   Marland Kitchen Marital Status:     Review of Systems: A 12 point ROS discussed and pertinent positives are indicated in the HPI above.  All other systems are negative.  Review of Systems  Constitutional: Positive for activity change. Negative for fatigue and fever.    Respiratory: Negative for cough and shortness of breath.   Cardiovascular: Negative for chest pain.  Gastrointestinal: Positive for anal bleeding, blood in stool and vomiting.       Hematemesis  Neurological: Negative for dizziness and weakness.  Psychiatric/Behavioral: Negative for confusion and decreased concentration.    Vital Signs: BP 135/83   Pulse 83   Temp 98.5 F (36.9 C) (Oral)   Resp 14   Ht 5\' 7"  (1.702 m)   Wt 143 lb 4.8 oz (65 kg)   SpO2 98%   BMI 22.44 kg/m   Physical Exam Vitals reviewed.  Cardiovascular:     Rate and Rhythm: Normal rate and regular rhythm.     Heart sounds: Normal heart sounds.  Pulmonary:     Effort: Pulmonary effort is normal.     Breath sounds: Normal breath sounds.  Abdominal:     Palpations: Abdomen is soft.  Musculoskeletal:        General: Normal range  of motion.  Skin:    General: Skin is warm.  Neurological:     Mental Status: He is alert and oriented to person, place, and time.  Psychiatric:        Behavior: Behavior normal.     Imaging: DG Chest Portable 1 View  Result Date: 12/04/2019 CLINICAL DATA:  Multiple episodes of emesis.  Hematemesis. EXAM: PORTABLE CHEST 1 VIEW COMPARISON:  None. FINDINGS: The heart size and mediastinal contours are within normal limits. Both lungs are clear. The visualized skeletal structures are unremarkable. IMPRESSION: No acute cardiopulmonary process. Electronically Signed   By: Genevive Bi M.D.   On: 12/04/2019 20:55    Labs:  CBC: Recent Labs    10/10/19 0032 10/10/19 1543 10/11/19 4742 10/11/19 5956 12/04/19 1959 12/04/19 2306 12/05/19 0539 12/05/19 1200  WBC 9.2  --  6.5  --  12.8*  --  11.0*  --   HGB 10.4*   < > 9.6*   < > 15.7 12.6* 12.2* 10.6*  HCT 31.4*   < > 29.0*   < > 48.1 39.5 37.9* 34.0*  PLT 120*  --  108*  --  252  --  127*  --    < > = values in this interval not displayed.    COAGS: Recent Labs    07/16/19 1641 07/16/19 1641 08/30/19 1445  09/01/19 0805 10/09/19 0303 12/04/19 1959  INR 1.5*   < > 1.9* 1.9* 1.3* 1.4*  APTT 31  --  31  --   --   --    < > = values in this interval not displayed.    BMP: Recent Labs    10/10/19 0032 10/11/19 0623 12/04/19 1959 12/05/19 0539  NA 142 142 143 144  K 3.3* 3.8 3.7 3.6  CL 104 109 98 103  CO2 27 27 30 28   GLUCOSE 106* 109* 125* 135*  BUN 12 8 8 12   CALCIUM 7.9* 8.0* 9.7 8.0*  CREATININE 0.92 0.87 1.00 0.88  GFRNONAA >60 >60 >60 >60  GFRAA >60 >60 >60 >60    LIVER FUNCTION TESTS: Recent Labs    10/10/19 0032 10/11/19 0623 12/04/19 1959 12/05/19 0539  BILITOT 1.5* 1.2 1.6* 1.4*  AST 36 34 49* 35  ALT 18 16 23 18   ALKPHOS 43 38 63 47  PROT 6.5 6.0* 8.4* 6.4*  ALBUMIN 3.1* 2.8* 3.8 2.8*    TUMOR MARKERS: No results for input(s): AFPTM, CEA, CA199, CHROMGRNA in the last 8760 hours.  Assessment and Plan:  GI bleeding/esophageal varices Known Alcohol Cirrhosis Follows with GI Banding x 5 today 3 previous esophageal varices banding 2017-2021 Development of few melanic stools Stable after banding today Hg 10.6 (15.7) Pt is aware of possible Transjugular Intrahepatic Portal System shunt procedure if would be needed. All questions answered to satisfaction No consent obtained-- yet    Thank you for this interesting consult.  I greatly enjoyed meeting [name] and look forward to participating in their care.  A copy of this report was sent to the requesting provider on this date.  Electronically Signed: , PA-C 12/05/2019, 1:45 PM   I spent a total of 40 Minutes    in face to face in clinical consultation, greater than 50% of which was counseling/coordinating care for TIPS - if needed

## 2019-12-05 NOTE — ED Notes (Signed)
Most recent stool contained small blue band, unsure if related to previous procedure in endo this AM. Admitting MD paged. No changes otherwise, v/s stable, A&Ox4, no distention, no hematemesis . 4 total bloody stools since procure this AM, ~75cc each.

## 2019-12-05 NOTE — Progress Notes (Addendum)
PROGRESS NOTE    [name]  OXB:[id number] DOB: 10-May-1980 DOA: 12/04/2019 PCP: Farris Has, MD    Brief Narrative: [name] is a 39 y.o. male with medical history significant for alcoholic cirrhosis, esophageal varices, GERD, previous blood transfusion secondary to esophageal variceal bleeding.  [name] states that at 4 pm yesterday he started to have hematemesis.He was just sitting at home when the symptoms spontaneously started.  Second episode of hematemesis at home and decide then come to the emergency room.  In the emergency room he has had 3 further episodes of hematemesis.  He states he is filled out for emesis bags with bright red blood.He reports a history of upper GI bleed due to esophageal varices. This is due to alcohol use. He states he has not had any alcohol in several weeks. He denies other drug use. He states he has been taking most of his medications as prescribed, including his Protonix, though he has not yet had it today. He follows with [facility]. He was last in the hospital several months ago for the same. He denies fevers, chills, chest pain, shortness breath, abdominal pain, urinary symptoms, abnormal bowel movements. He is not on blood thinner.  Denies any recent abdominal trauma. ED Course: Hemodynamically stable in the emergency room other than some mild tachycardia.  He was started on octreotide and IV Protonix.  [name] underwent EGD found to have active variceal bleed, treated with 5 bands placement, IR consulted for possible TIPS procedure if he continues to have ongoing bleeding.   Assessment & Plan:   Active Problems:   Acute GI bleeding   Alcohol abuse   Alcoholic cirrhosis (HCC)   Tachycardia   Bleeding esophageal varices (HCC)   Upper GI bleeding  Acute GI bleeding from esophageal varices [name] is admitted to medical telemetry floor. Multiple episodes of hematemesis while in the emergency room.   Hemodynamically he  is stable.  He does have mild tachycardia. Continue IV fluid hydration. Monitor H&H every 6 hours. [name] started on Protonix 40 mg IV daily.  [name] started on octreotide as he has history of bleeding esophageal varices. Gastroenterology was consulted, [name] underwent EGD found to have bleeding varices underwent banding. IR consulted for TIPS procedure if [name] continues to have ongoing bleeding, Monitor H&H every 8 hours. ICU consulted, if [name] needs to be transferred in case of decompensation.     Alcoholic cirrhosis (HCC) Chronic    Bleeding esophageal varices [facility]) Gastroenterology consulted.  Octreotide therapy as above.  PPI therapy as above.    Tachycardia IV fluid hydration provided.  We will continue to monitor on telemetry.    Alcohol abuse With chronic alcoholism.  He does have known alcoholic cirrhosis.   Consult discharge planning for assistance with outpatient alcohol rehabilitation programs for [name]   DVT prophylaxis: SCDS Code Status: Full Family Communication: Discussed with [name] in detail. Disposition Plan:  Anticipated discharge home once medically clear.  Ongoing GI work-up.  Consultants:   Gastroenterology Dr. Levora Angel  Procedures: EGD with banding  Antimicrobials:  Anti-infectives (From admission, onward)   Start     Dose/Rate Route Frequency Ordered Stop   12/05/19 2200  cefTRIAXone (ROCEPHIN) 2 g in sodium chloride 0.9 % 100 mL IVPB     Discontinue     2 g 200 mL/hr over 30 Minutes Intravenous Every 24 hours 12/05/19 1121     12/04/19 2130  cefTRIAXone (ROCEPHIN) 1 g in sodium chloride 0.9 % 100 mL IVPB  1 g 200 mL/hr over 30 Minutes Intravenous  Once 12/04/19 2119 12/04/19 2301      Subjective: [name] was seen and examined at bedside.  He is a s/p EGD with banding.  He reports feeling better.  He denies any nausea vomiting diarrhea.  Objective: Vitals:   12/05/19 1409 12/05/19 1449 12/05/19 1500 12/05/19 1511    BP:   112/81   Pulse: 81 81 83 80  Resp: 18 18 17 15   Temp:      TempSrc:      SpO2: 97% 97% 96% 98%  Weight:      Height:        Intake/Output Summary (Last 24 hours) at 12/05/2019 1649 Last data filed at 12/05/2019 1105 Gross per 24 hour  Intake 2510.47 ml  Output --  Net 2510.47 ml   Filed Weights   12/04/19 1951  Weight: 65 kg    Examination:  General exam: Appears calm and comfortable  Respiratory system: Clear to auscultation. Respiratory effort normal. Cardiovascular system: S1 & S2 heard, RRR. No JVD, murmurs, rubs, gallops or clicks. No pedal edema. Gastrointestinal system: Abdomen is nondistended, soft and nontender. No organomegaly or masses felt. Normal bowel sounds heard. Central nervous system: Alert and oriented. No focal neurological deficits. Extremities: Symmetric 5 x 5 power. Skin: No rashes, lesions or ulcers Psychiatry: Judgement and insight appear normal. Mood & affect appropriate.     Data Reviewed: I have personally reviewed following labs and imaging studies  CBC: Recent Labs  Lab 12/04/19 1959 12/04/19 2306 12/05/19 0539 12/05/19 1200  WBC 12.8*  --  11.0*  --   NEUTROABS 9.8*  --   --   --   HGB 15.7 12.6* 12.2* 10.6*  HCT 48.1 39.5 37.9* 34.0*  MCV 90.1  --  93.8  --   PLT 252  --  127*  --    Basic Metabolic Panel: Recent Labs  Lab 12/04/19 1959 12/05/19 0539  NA 143 144  K 3.7 3.6  CL 98 103  CO2 30 28  GLUCOSE 125* 135*  BUN 8 12  CREATININE 1.00 0.88  CALCIUM 9.7 8.0*   GFR: Estimated Creatinine Clearance: 103.6 mL/min (by C-G formula based on SCr of 0.88 mg/dL). Liver Function Tests: Recent Labs  Lab 12/04/19 1959 12/05/19 0539  AST 49* 35  ALT 23 18  ALKPHOS 63 47  BILITOT 1.6* 1.4*  PROT 8.4* 6.4*  ALBUMIN 3.8 2.8*   No results for input(s): LIPASE, AMYLASE in the last 168 hours. Recent Labs  Lab 12/04/19 2009  AMMONIA 82*   Coagulation Profile: Recent Labs  Lab 12/04/19 1959  INR 1.4*    Cardiac Enzymes: No results for input(s): CKTOTAL, CKMB, CKMBINDEX, TROPONINI in the last 168 hours. BNP (last 3 results) No results for input(s): PROBNP in the last 8760 hours. HbA1C: No results for input(s): HGBA1C in the last 72 hours. CBG: No results for input(s): GLUCAP in the last 168 hours. Lipid Profile: No results for input(s): CHOL, HDL, LDLCALC, TRIG, CHOLHDL, LDLDIRECT in the last 72 hours. Thyroid Function Tests: No results for input(s): TSH, T4TOTAL, FREET4, T3FREE, THYROIDAB in the last 72 hours. Anemia Panel: No results for input(s): VITAMINB12, FOLATE, FERRITIN, TIBC, IRON, RETICCTPCT in the last 72 hours. Sepsis Labs: No results for input(s): PROCALCITON, LATICACIDVEN in the last 168 hours.  Recent Results (from the past 240 hour(s))  SARS Coronavirus 2 by RT PCR (hospital order, performed in [facility] hospital lab) Nasopharyngeal Nasopharyngeal Swab  Status: None   Collection Time: 12/04/19  8:09 PM   Specimen: Nasopharyngeal Swab  Result Value Ref Range Status   SARS Coronavirus 2 NEGATIVE NEGATIVE Final    Comment: (NOTE) SARS-CoV-2 target nucleic acids are NOT DETECTED.  The SARS-CoV-2 RNA is generally detectable in upper and lower respiratory specimens during the acute phase of infection. The lowest concentration of SARS-CoV-2 viral copies this assay can detect is 250 copies / mL. A negative result does not preclude SARS-CoV-2 infection and should not be used as the sole basis for treatment or other [name] management decisions.  A negative result may occur with improper specimen collection / handling, submission of specimen other than nasopharyngeal swab, presence of viral mutation(s) within the areas targeted by this assay, and inadequate number of viral copies (<250 copies / mL). A negative result must be combined with clinical observations, [name] history, and epidemiological information.  Fact Sheet for Patients:    BoilerBrush.com.cy  Fact Sheet for Healthcare Providers: https://pope.com/  This test is not yet approved or  cleared by the [place] FDA and has been authorized for detection and/or diagnosis of SARS-CoV-2 by FDA under an Emergency Use Authorization (EUA).  This EUA will remain in effect (meaning this test can be used) for the duration of the COVID-19 declaration under Section 564(b)(1) of the Act, 21 U.S.C. section 360bbb-3(b)(1), unless the authorization is terminated or revoked sooner.  Performed at [facility], [address], [place], [place] [address]      Radiology Studies: DG Chest Portable 1 View  Result Date: 12/04/2019 CLINICAL DATA:  Multiple episodes of emesis.  Hematemesis. EXAM: PORTABLE CHEST 1 VIEW COMPARISON:  None. FINDINGS: The heart size and mediastinal contours are within normal limits. Both lungs are clear. The visualized skeletal structures are unremarkable. IMPRESSION: No acute cardiopulmonary process. Electronically Signed   By: Genevive Bi M.D.   On: 12/04/2019 20:55    Scheduled Meds: . ferrous sulfate  325 mg Oral Daily  . lactulose  15 g Oral BID  . magnesium oxide  400 mg Oral BID  . multivitamin with minerals  1 tablet Oral Daily  . nadolol  20 mg Oral Daily  . pantoprazole (PROTONIX) IV  40 mg Intravenous Q24H  . sertraline  100 mg Oral Daily   Continuous Infusions: . cefTRIAXone (ROCEPHIN)  IV    . lactated ringers 100 mL/hr at 12/05/19 0847  . octreotide  (SANDOSTATIN)    IV infusion 50 mcg/hr (12/05/19 0655)     LOS: 1 day    Time spent: 25 mins.    Cipriano Bunker, MD [facility]   If 7PM-7AM, please contact night-coverage

## 2019-12-05 NOTE — ED Notes (Signed)
Spoke to Dr Loney Loh via phone, shared background/situation but that vitals and condition otherwise stable. Dr. Loney Loh plans to contact IR for further plan, this RN to contact Dr. Loney Loh if vital signs change, etc before this.

## 2019-12-05 NOTE — Consult Note (Signed)
Referring Provider: Alveria ApleySophia Caccavale, PA-C (ED) Primary Care Physician:  Farris HasMorrow, Aaron, MD Primary Gastroenterologist:  Gentry FitzUnassigned  Reason for Consultation:  Upper GI Bleeding  HPI: James BalDaniel W Best is a 39 y.o. male with history of alcoholic cirrhosis and variceal bleeding presenting with upper GI bleeding.  Patient started experiencing hematemesis at 4:30 PM yesterday 8/8.  He states he had at least 6-10 episodes of emesis, all of which were bright red.  He states he completely filled 5 emesis bags.    He had 1 melenic stool yesterday and 1 brown stool today.  His stools are chronically loose due to lactulose.  He denies any abdominal pain.  Denies any dysphagia, unintentional weight loss, or changes in appetite.  States he has not used any alcohol for several weeks.  Denies NSAID, aspirin, or blood thinner use.  EGD 10/10/19: Grade III esophageal varices. Completely eradicated, banded. Portal hypertensive gastropathy.  EGD 08/30/19: Grade III and large (> 5 mm) esophageal varices, completely eradicated, banded. Esophageal polyp- likely papilloma was found in mid esophagus.  Portal hypertensive gastropathy. Possible gastric varices, without bleeding.  Past Medical History:  Diagnosis Date  . Cirrhosis of liver (HCC)   . Esophageal varices with hemorrhage (HCC)   . ETOH abuse   . GERD (gastroesophageal reflux disease)     Past Surgical History:  Procedure Laterality Date  . ESOPHAGEAL BANDING  08/30/2019   Procedure: ESOPHAGEAL BANDING;  Surgeon: Kerin SalenKarki, Arya, MD;  [facility];  Service: Gastroenterology;;  . ESOPHAGEAL BANDING  10/10/2019   Procedure: ESOPHAGEAL BANDING;  Surgeon: Kerin SalenKarki, Arya, MD;  [facility];  Service: Gastroenterology;;  . ESOPHAGOGASTRODUODENOSCOPY Left 08/21/2015   Procedure: ESOPHAGOGASTRODUODENOSCOPY (EGD);  Surgeon: Charlott RakesVincent Schooler, MD;  [facility];  Service: Endoscopy;  Laterality: Left;  . ESOPHAGOGASTRODUODENOSCOPY (EGD) WITH  PROPOFOL N/A 08/30/2019   Procedure: ESOPHAGOGASTRODUODENOSCOPY (EGD) WITH PROPOFOL;  Surgeon: Kerin SalenKarki, Arya, MD;  [facility];  Service: Gastroenterology;  Laterality: N/A;  . ESOPHAGOGASTRODUODENOSCOPY (EGD) WITH PROPOFOL N/A 10/10/2019   Procedure: ESOPHAGOGASTRODUODENOSCOPY (EGD) WITH PROPOFOL;  Surgeon: Kerin SalenKarki, Arya, MD;  [facility];  Service: Gastroenterology;  Laterality: N/A;    Prior to Admission medications   Medication Sig Start Date End Date Taking? Authorizing Provider  Ferrous Sulfate (IRON) 325 (65 Fe) MG TABS Take 1 tablet (325 mg total) by mouth daily. 09/02/19  Yes Regalado, Belkys A, MD  lactulose (CHRONULAC) 10 GM/15ML solution Take 45 mLs (30 g total) by mouth 2 (two) times daily. Patient taking differently: Take 15 g by mouth 2 (two) times daily.  09/02/19  Yes Regalado, Belkys A, MD  magnesium oxide (MAG-OX) 400 (241.3 Mg) MG tablet Take 1 tablet (400 mg total) by mouth 2 (two) times daily. 09/02/19  Yes Regalado, Belkys A, MD  Multiple Vitamin (MULTIVITAMIN WITH MINERALS) TABS tablet Take 1 tablet by mouth daily. 07/19/19  Yes Johnson, Clanford L, MD  nadolol (CORGARD) 20 MG tablet Take 1 tablet (20 mg total) by mouth daily. 09/02/19  Yes Regalado, Belkys A, MD  omeprazole (PRILOSEC OTC) 20 MG tablet Take 20 mg by mouth daily before breakfast.   Yes [provider]  potassium chloride SA (KLOR-CON) 20 MEQ tablet Take 1 tablet (20 mEq total) by mouth daily. 09/02/19  Yes Regalado, Belkys A, MD  sertraline (ZOLOFT) 100 MG tablet Take 1 tablet (100 mg total) by mouth daily. 09/02/19  Yes Regalado, Belkys A, MD  omeprazole (PRILOSEC) 20 MG capsule Take 1 capsule (20 mg total) by mouth daily. Patient  not taking: Reported on 12/04/2019 09/02/19   Hartley Barefoot A, MD    Scheduled Meds: . ferrous sulfate  325 mg Oral Daily  . lactulose  15 g Oral BID  . magnesium oxide  400 mg Oral BID  . multivitamin with minerals  1 tablet Oral Daily  . nadolol  20 mg Oral  Daily  . pantoprazole (PROTONIX) IV  40 mg Intravenous Q24H  . sertraline  100 mg Oral Daily   Continuous Infusions: . lactated ringers 100 mL/hr at 12/04/19 2308  . octreotide  (SANDOSTATIN)    IV infusion 50 mcg/hr (12/05/19 0655)   PRN Meds:.  Allergies as of 12/04/2019 - Review Complete 12/04/2019  Allergen Reaction Noted  . Penicillins Hives 05/26/2018    Family History  Problem Relation Age of Onset  . Peptic Ulcer Paternal Aunt   . Crohn's disease Maternal Uncle   . Heart attack Maternal Uncle   . Heart attack Maternal Grandfather   . Stroke Maternal Grandmother   . Stroke Paternal Grandfather     Social History   Socioeconomic History  . Marital status: Single    Spouse name: Not on file  . Number of children: Not on file  . Years of education: Not on file  . Highest education level: Not on file  Occupational History  . Not on file  Tobacco Use  . Smoking status: Former Smoker    Packs/day: 1.00    Types: Cigarettes  . Smokeless tobacco: Never Used  Vaping Use  . Vaping Use: Never used  Substance and Sexual Activity  . Alcohol use: Yes    Comment: Alcoholism  . Drug use: Yes    Types: Marijuana  . Sexual activity: Not on file  Other Topics Concern  . Not on file  Social History Narrative  . Not on file   Social Determinants of Health   Financial Resource Strain:   . Difficulty of Paying Living Expenses:   Food Insecurity:   . Worried About Programme researcher, broadcasting/film/video in the Last Year:   . Barista in the Last Year:   Transportation Needs:   . Freight forwarder (Medical):   Marland Kitchen Lack of Transportation (Non-Medical):   Physical Activity:   . Days of Exercise per Week:   . Minutes of Exercise per Session:   Stress:   . Feeling of Stress :   Social Connections:   . Frequency of Communication with Friends and Family:   . Frequency of Social Gatherings with Friends and Family:   . Attends Religious Services:   . Active Member of Clubs or  Organizations:   . Attends Banker Meetings:   Marland Kitchen Marital Status:   Intimate Partner Violence:   . Fear of Current or Ex-Partner:   . Emotionally Abused:   Marland Kitchen Physically Abused:   . Sexually Abused:     Review of Systems: Review of Systems  Constitutional: Negative for chills, fever and weight loss.  HENT: Negative for hearing loss and tinnitus.   Eyes: Negative for pain and redness.  Respiratory: Negative for cough and shortness of breath.   Cardiovascular: Negative for chest pain and palpitations.  Gastrointestinal: Positive for diarrhea (due to lactulose), melena, nausea and vomiting. Negative for abdominal pain, blood in stool, constipation and heartburn.  Genitourinary: Negative for flank pain and hematuria.  Musculoskeletal: Negative for falls and joint pain.  Skin: Negative for itching and rash.  Neurological: Negative for seizures and loss of consciousness.  Endo/Heme/Allergies: Negative for polydipsia. Does not bruise/bleed easily.  Psychiatric/Behavioral: Positive for substance abuse (ETOH, in the past). The patient is not nervous/anxious.     Physical Exam: Vital signs: Vitals:   12/05/19 0500 12/05/19 0600  BP: 119/82 121/85  Pulse: 75 76  Resp: 18 17  Temp:    SpO2: 96% 94%     Physical Exam Constitutional:      General: He is not in acute distress.    Appearance: Normal appearance.  HENT:     Head: Normocephalic and atraumatic.     Nose: Nose normal.     Mouth/Throat:     Mouth: Mucous membranes are moist.     Pharynx: Oropharynx is clear.  Eyes:     General: No scleral icterus.    Extraocular Movements: Extraocular movements intact.     Conjunctiva/sclera: Conjunctivae normal.  Cardiovascular:     Rate and Rhythm: Normal rate and regular rhythm.     Pulses: Normal pulses.     Heart sounds: Normal heart sounds.  Pulmonary:     Effort: Pulmonary effort is normal. No respiratory distress.     Breath sounds: Normal breath sounds.   Abdominal:     General: Bowel sounds are normal. There is no distension.     Palpations: Abdomen is soft. There is no mass.     Tenderness: There is no abdominal tenderness. There is no guarding or rebound.     Hernia: No hernia is present.  Musculoskeletal:        General: Normal range of motion.     Cervical back: Normal range of motion and neck supple.  Skin:    General: Skin is warm and dry.     Coloration: Skin is not jaundiced.  Neurological:     General: No focal deficit present.     Mental Status: He is alert and oriented to person, place, and time.  Psychiatric:        Mood and Affect: Mood normal.        Behavior: Behavior normal.     GI:  Lab Results: Recent Labs    12/04/19 1959 12/04/19 2306 12/05/19 0539  WBC 12.8*  --  11.0*  HGB 15.7 12.6* 12.2*  HCT 48.1 39.5 37.9*  PLT 252  --  127*   BMET Recent Labs    12/04/19 1959 12/05/19 0539  NA 143 144  K 3.7 3.6  CL 98 103  CO2 30 28  GLUCOSE 125* 135*  BUN 8 12  CREATININE 1.00 0.88  CALCIUM 9.7 8.0*   LFT Recent Labs    12/05/19 0539  PROT 6.4*  ALBUMIN 2.8*  AST 35  ALT 18  ALKPHOS 47  BILITOT 1.4*   PT/INR Recent Labs    12/04/19 1959  LABPROT 17.0*  INR 1.4*     Studies/Results: DG Chest Portable 1 View  Result Date: 12/04/2019 CLINICAL DATA:  Multiple episodes of emesis.  Hematemesis. EXAM: PORTABLE CHEST 1 VIEW COMPARISON:  None. FINDINGS: The heart size and mediastinal contours are within normal limits. Both lungs are clear. The visualized skeletal structures are unremarkable. IMPRESSION: No acute cardiopulmonary process. Electronically Signed   By: Genevive Bi M.D.   On: 12/04/2019 20:55    Impression: Upper GI bleeding: Portal hypertensive gastropathy vs. Variceal bleeding. Patient remains hemodynamically stable. -Hgb 12.2 today, as compared to 15.7 on arrival yesterday -Platelets 127K/uL -INR 1.4 as of 8/8 -BUN normal (12) with Cr 0.88  Cirrhosis, compensated.   MELD score  of 12 as of 12/04/19 Mildly elevated T. Bili (1.4), otherwise normal LFTs  Plan: EGD today with Dr. Levora Angel.  I thoroughly discussed the procedure with the patient to include nature, alternatives, benefits, and risks (including but not limited to bleeding, infection, perforation, anesthesia/cardiac and pulmonary complications).  Patient verbalized understanding and gave verbal consent to proceed with EGD.  Continue octreotide drip.  Continue Protonix IV twice daily.  Continue lactulose 15g PO twice daily, titrated for 2-3 soft bowel movements per day.  Continue to monitor H&H with transfusion as needed to maintain hemoglobin greater than 7.  [facility] will follow.   LOS: 1 day   Edrick Kins  PA-C 12/05/2019, 8:46 AM  Contact #  [phone]

## 2019-12-05 NOTE — Progress Notes (Signed)
Floor coverage  Patient with a past medical history of alcoholic liver cirrhosis, esophageal varices admitted 8/8 for hematemesis secondary to bleeding esophageal varices for which he underwent EGD and variceal banding (5 bands placed).  He also had bleeding from portal hypertensive gastropathy which was treated with APC.  He is being treated with octreotide, Protonix, and antibiotics.  Plan was to consult IR for TIPS procedure if the patient continued to have ongoing bleeding.  Notified by nursing staff that the patient is no longer having hematemesis and is hemodynamically stable.  However, he has had 4 episodes of bloody stools (approximately 75 cc each) and a blue rubber band was noticed in the stool. Hemoglobin 15.7 at the time of admission, downtrending since then 12.6 >12.2>10.6>9.8  Plan -Continue octreotide, Protonix, and antibiotics -Discussed with GI and IR-ongoing blood loss is felt to be less likely given no active hematemesis or signs of hemodynamically instability.  It is felt that the patient is likely passing over left over blood from the procedure through his bowel. -Monitor H&H every 8 hours, GI recommending transfusing if hemoglobin <7 since he is otherwise hemodynamically stable -Current bed request is for telemetry bed, will change to progressive care for closer monitoring.  May need ICU transfer if hemodynamically unstable.

## 2019-12-05 NOTE — Op Note (Signed)
[facility] Patient Name: James CollumDaniel Kron Procedure Date : 12/05/2019 MRN: [id number] Attending MD: Kathi DerParag Vikas Wegmann , MD Date of Birth: 1981/02/23 CSN: [id number] Age: 39 Admit Type: Inpatient Procedure:                Upper GI endoscopy Indications:              Hematochezia Providers:                Kathi DerParag Gil Ingwersen, MD, Rogue JurySandy Andrews, RN, Lawson Radararlene                            Davis, Technician, Michele McalpineErik Holloway Technician Referring MD:              Medicines:                Sedation Administered by an Anesthesia Professional Complications:            No immediate complications. Estimated Blood Loss:     Estimated blood loss was minimal. Procedure:                Pre-Anesthesia Assessment:                           - Prior to the procedure, a History and Physical                            was performed, and patient medications and                            allergies were reviewed. The patient's tolerance of                            previous anesthesia was also reviewed. The risks                            and benefits of the procedure and the sedation                            options and risks were discussed with the patient.                            All questions were answered, and informed consent                            was obtained. Prior Anticoagulants: The patient has                            taken no previous anticoagulant or antiplatelet                            agents. ASA Grade Assessment: II - A patient with                            mild systemic disease. After reviewing the risks  and benefits, the patient was deemed in                            satisfactory condition to undergo the procedure.                           After obtaining informed consent, the endoscope was                            passed under direct vision. Throughout the                            procedure, the patient's blood pressure, pulse, and                             oxygen saturations were monitored continuously. The                            GIF-H190 ([id number]) Olympus gastroscope was                            introduced through the mouth, and advanced to the                            second part of duodenum. The upper GI endoscopy was                            accomplished without difficulty. The patient                            tolerated the procedure well. Scope In: Scope Out: Findings:      Three columns of large (> 5 mm) varices spurting blood were found in the       mid esophagus and in the distal esophagus,. Red wale signs were present.       Scarring from prior treatment was visible. The varices appeared       unchanged in size from prior exam. There was evidence of recent bleeding       from distal esophageal varices. Upon entering the stomach, I noted an       area of active bleeding in the gastric body from portal hypertensive       gastropathy. It was treated with APC. Upon retroflexion I noted to have       blood coming out from cardia. Scope was withdrawn to the esophagus and       he was found to have spurting blood from esophageal varices.Five bands       were successfully placed with incomplete eradication of varices.Bleeding       had stopped at the end of the procedure.      Severe portal hypertensive gastropathy was found in the gastric body.       Fulguration to stop the bleeding by argon plasma was successful.      Red blood was found in the entire examined stomach.      Red blood was found in the duodenal bulb.      The first portion of the duodenum and  second portion of the duodenum       were normal. Impression:               - Large (> 5 mm) esophageal varices spurting blood.                            Incompletely eradicated. Banded.                           - Portal hypertensive gastropathy. Treated with                            argon plasma coagulation (APC).                           -  Red blood in the entire stomach.                           - Blood in the duodenal bulb.                           - Normal first portion of the duodenum and second                            portion of the duodenum.                           - No specimens collected. Recommendation:           - Return patient to ICU for ongoing care.                           - NPO.                           - Continue present medications.                           - Refer to an interventional radiologist today. Procedure Code(s):        --- Professional ---                           [phone], Esophagogastroduodenoscopy, flexible,                            transoral; with band ligation of esophageal/gastric                            varices                           43255, 59, Esophagogastroduodenoscopy, flexible,                            transoral; with control of bleeding, any method Diagnosis Code(s):        --- Professional ---  I85.01, Esophageal varices with bleeding                           K76.6, Portal hypertension                           K31.89, Other diseases of stomach and duodenum                           K92.2, Gastrointestinal hemorrhage, unspecified                           K92.1, Melena (includes Hematochezia) CPT copyright 2019 American Medical Association. All rights reserved. The codes documented in this report are preliminary and upon coder review may  be revised to meet current compliance requirements. Kathi Der, MD Kathi Der, MD 12/05/2019 11:19:53 AM Number of Addenda: 0

## 2019-12-05 NOTE — Brief Op Note (Signed)
12/04/2019 - 12/05/2019  11:23 AM  PATIENT:  [name]  39 y.o. male  PRE-OPERATIVE DIAGNOSIS:  upper GI bleeding  POST-OPERATIVE DIAGNOSIS:  APC fundus of stomach, GI bleed, 6 bands on gastric varices  PROCEDURE:  Procedure(s): ESOPHAGOGASTRODUODENOSCOPY (EGD) (N/A) HOT HEMOSTASIS (ARGON PLASMA COAGULATION/BICAP) (N/A) GASTRIC VARICES BANDING  SURGEON:  Surgeon(s) and Role:    * Deronda Christian, MD - Primary  Findings ------------ -Actively bleeding esophageal varices treated with 5 bands placement. -Bleeding from portal hypertensive gastropathy treated with APC.  Recommendations ------------------------ -Transfer to ICU -Continue octreotide and Protonix.  Continue antibiotics -Monitor H&H every 8 hours for another 24 to 48 hours -Intervention radiology consult for evaluation of TIPS if ongoing bleeding -Keep n.p.o. for now -GI will follow  Kathi Der MD, FACP 12/05/2019, 11:24 AM  Contact #  [phone]

## 2019-12-05 NOTE — Transfer of Care (Signed)
Immediate Anesthesia Transfer of Care Note  Patient: [name] [facility]  Procedure(s) Performed: ESOPHAGOGASTRODUODENOSCOPY (EGD) (N/A ) HOT HEMOSTASIS (ARGON PLASMA COAGULATION/BICAP) (N/A ) GASTRIC VARICES BANDING  Patient Location: Endoscopy Unit  Anesthesia Type:MAC  Level of Consciousness: drowsy and patient cooperative  Airway & Oxygen Therapy: Patient Spontanous Breathing and Patient connected to nasal cannula oxygen  Post-op Assessment: Report given to RN and Post -op Vital signs reviewed and stable  Post vital signs: Reviewed and stable  Last Vitals:  Vitals Value Taken Time  BP 127/81   Temp    Pulse    Resp    SpO2 99     Last Pain:  Vitals:   12/05/19 0954  TempSrc: Oral  PainSc:          Complications: No complications documented.

## 2019-12-05 NOTE — ED Notes (Signed)
Called IRoffice concerning not ye receiving return page from on call IR radiologist. Provided with pager number for physician that is the back up for on-call. Spoke to Dr. Rica Records, communicated pt changes (bleeding, trend of H&H); responds that he will share this information with Dr. Loreta Ave (the on-call IR radiologist) who is likely still in a case and unable to respond to previous page. No immediate intervention needed at this time.

## 2019-12-05 NOTE — ED Notes (Signed)
Spoke to Dr. Loney Loh via phone, GI and IR consulted, since vital signs WNL, stool and presence of band in stool should be considered expected side effects of procedure. Pt will be made a progressive bed for increased monitoring, continue Q6hr H&H draws; no other interventions at this time. Plan shared with pt.

## 2019-12-06 ENCOUNTER — Encounter (HOSPITAL_COMMUNITY): Payer: Self-pay | Admitting: Gastroenterology

## 2019-12-06 ENCOUNTER — Inpatient Hospital Stay (HOSPITAL_COMMUNITY): Payer: Self-pay

## 2019-12-06 LAB — HEMOGLOBIN AND HEMATOCRIT, BLOOD
HCT: 29.8 % — ABNORMAL LOW (ref 39.0–52.0)
HCT: 30.6 % — ABNORMAL LOW (ref 39.0–52.0)
Hemoglobin: 9.5 g/dL — ABNORMAL LOW (ref 13.0–17.0)
Hemoglobin: 9.6 g/dL — ABNORMAL LOW (ref 13.0–17.0)

## 2019-12-06 LAB — COMPREHENSIVE METABOLIC PANEL
ALT: 15 U/L (ref 0–44)
AST: 36 U/L (ref 15–41)
Albumin: 2.4 g/dL — ABNORMAL LOW (ref 3.5–5.0)
Alkaline Phosphatase: 37 U/L — ABNORMAL LOW (ref 38–126)
Anion gap: 10 (ref 5–15)
BUN: 10 mg/dL (ref 6–20)
CO2: 28 mmol/L (ref 22–32)
Calcium: 7.7 mg/dL — ABNORMAL LOW (ref 8.9–10.3)
Chloride: 105 mmol/L (ref 98–111)
Creatinine, Ser: 0.9 mg/dL (ref 0.61–1.24)
GFR calc Af Amer: >60 mL/min (ref >60)
GFR calc non Af Amer: >60 mL/min (ref >60)
Glucose, Bld: 90 mg/dL (ref 70–99)
Potassium: 3.4 mmol/L — ABNORMAL LOW (ref 3.5–5.1)
Sodium: 143 mmol/L (ref 135–145)
Total Bilirubin: 1.2 mg/dL (ref 0.3–1.2)
Total Protein: 5 g/dL — ABNORMAL LOW (ref 6.5–8.1)

## 2019-12-06 LAB — CBC
HCT: 28.7 % — ABNORMAL LOW (ref 39.0–52.0)
Hemoglobin: 9 g/dL — ABNORMAL LOW (ref 13.0–17.0)
MCH: 29.8 pg (ref 26.0–34.0)
MCHC: 31.4 g/dL (ref 30.0–36.0)
MCV: 95 fL (ref 80.0–100.0)
Platelets: 96 10*3/uL — ABNORMAL LOW (ref 150–400)
RBC: 3.02 MIL/uL — ABNORMAL LOW (ref 4.22–5.81)
RDW: 15.2 % (ref 11.5–15.5)
WBC: 7.8 10*3/uL (ref 4.0–10.5)
nRBC: 0 % (ref 0.0–0.2)

## 2019-12-06 LAB — PROTIME-INR
INR: 1.6 — ABNORMAL HIGH (ref 0.8–1.2)
Prothrombin Time: 18.7 seconds — ABNORMAL HIGH (ref 11.4–15.2)

## 2019-12-06 MED ORDER — IOHEXOL 350 MG/ML SOLN
100.0000 mL | Freq: Once | INTRAVENOUS | Status: AC | PRN
  Administered 2019-12-06: 100 mL via INTRAVENOUS

## 2019-12-06 MED ORDER — POTASSIUM CHLORIDE 20 MEQ PO PACK: 40.0000 meq | PACK | Freq: Once | ORAL | Status: DC

## 2019-12-06 NOTE — ED Notes (Signed)
Lunch Tray Ordered @ 1107. 

## 2019-12-06 NOTE — Progress Notes (Signed)
PROGRESS NOTE    [name]  ESP:[id number] DOB: April 21, 1981 DOA: 12/04/2019 PCP: Farris Has, MD    Brief Narrative: [name] is a 39 y.o. male with medical history significant for alcoholic cirrhosis, esophageal varices, GERD, previous blood transfusion secondary to esophageal variceal bleeding.  [name] states that at 4 pm yesterday he started to have hematemesis.He was just sitting at home when the symptoms spontaneously started.  Second episode of hematemesis at home and decide then come to the emergency room.  In the emergency room he has had 3 further episodes of hematemesis.  He states he is filled out for emesis bags with bright red blood.He reports a history of upper GI bleed due to esophageal varices. This is due to alcohol use. He states he has not had any alcohol in several weeks. He denies other drug use. He states he has been taking most of his medications as prescribed, including his Protonix, though he has not yet had it today. He follows with [facility]. He was last in the hospital several months ago for the same. He denies fevers, chills, chest pain, shortness breath, abdominal pain, urinary symptoms, abnormal bowel movements. He is not on blood thinner.  Denies any recent abdominal trauma. ED Course: Hemodynamically stable in the emergency room other than some mild tachycardia.  He was started on octreotide and IV Protonix.  Patient underwent EGD found to have active variceal bleed, treated with 5 bands placement, IR consulted for possible TIPS procedure if he continues to have ongoing bleeding. Patient continues to be monitored closely for further Bleeding.   Assessment & Plan:   Active Problems:   Acute GI bleeding   Alcohol abuse   Alcoholic cirrhosis (HCC)   Tachycardia   Bleeding esophageal varices (HCC)   Upper GI bleeding  Acute GI bleeding from esophageal varices : [name] is admitted to progressive unit. Multiple episodes of  hematemesis while in the emergency room.   Hemodynamically he is stable.  He does have mild tachycardia. Continue IV fluid hydration. Monitor H&H every 6 hours. Hb 9.0 today Patient started on octreotide as he has history of bleeding esophageal varices. Gastroenterology was consulted, patient underwent EGD found to have bleeding varices underwent banding. IR consulted for TIPS procedure if patient continues to have ongoing bleeding, Continue octreotide, Protonix and antibiotic for total 72 hours. Avoid over transfusion, transfuse if hemoglobin drops below 7. Start clear liquid diet.   Alcoholic cirrhosis (HCC) Chronic  Bleeding esophageal varices [facility]) Gastroenterology consulted.  Octreotide therapy as above.  PPI therapy as above. Patient may benefit from TIPS procedure, interventional radiology is consulted.  Tachycardia - Improved. IV fluid hydration provided.  We will continue to monitor on telemetry.  Alcohol abuse With chronic alcoholism.  He does have known alcoholic cirrhosis.   Consult discharge planning for assistance with outpatient alcohol rehabilitation programs for patient   DVT prophylaxis: SCDS Code Status: Full Family Communication: Discussed with patient in detail. Disposition Plan:  Anticipated discharge home once medically clear.  Ongoing GI work-up.  Consultants:   Gastroenterology Dr. Levora Angel  Procedures: EGD with banding  Antimicrobials:  Anti-infectives (From admission, onward)   Start     Dose/Rate Route Frequency Ordered Stop   12/05/19 2200  cefTRIAXone (ROCEPHIN) 2 g in sodium chloride 0.9 % 100 mL IVPB     Discontinue     2 g 200 mL/hr over 30 Minutes Intravenous Every 24 hours 12/05/19 1121     12/04/19 2130  cefTRIAXone (ROCEPHIN) 1 g in sodium chloride 0.9 % 100 mL IVPB        1 g 200 mL/hr over 30 Minutes Intravenous  Once 12/04/19 2119 12/04/19 2301      Subjective: Patient was seen and examined at bedside.  He denies any  nausea, vomiting, diarrhea.  He reports having 4 bowel movement yesterday which has bright red blood, has brownish stool in the morning today.  Objective: Vitals:   12/06/19 0301 12/06/19 0700 12/06/19 0719 12/06/19 0915  BP: 108/71 108/62  117/76  Pulse: 85 74 72 74  Resp: (!) 21 14 15 14   Temp:    98.4 F (36.9 C)  TempSrc:    Oral  SpO2: 96% 92% 94% 96%  Weight:      Height:        Intake/Output Summary (Last 24 hours) at 12/06/2019 1155 Last data filed at 12/06/2019 0916 Gross per 24 hour  Intake 100 ml  Output 551 ml  Net -451 ml   Filed Weights   12/04/19 1951  Weight: 65 kg    Examination:  General exam: Appears calm and comfortable  Respiratory system: Clear to auscultation. Respiratory effort normal. Cardiovascular system: S1 & S2 heard, RRR. No JVD, murmurs, rubs, gallops or clicks. No pedal edema. Gastrointestinal system: Abdomen is nondistended, soft and nontender. No organomegaly or masses felt. Normal bowel sounds heard. Central nervous system: Alert and oriented. No focal neurological deficits. Extremities: Symmetric 5 x 5 power. Skin: No rashes, lesions or ulcers Psychiatry: Judgement and insight appear normal. Mood & affect appropriate.     Data Reviewed: I have personally reviewed following labs and imaging studies  CBC: Recent Labs  Lab 12/04/19 1959 12/04/19 2306 12/05/19 0539 12/05/19 1200 12/05/19 1800 12/06/19 0043 12/06/19 0408  WBC 12.8*  --  11.0*  --   --   --  7.8  NEUTROABS 9.8*  --   --   --   --   --   --   HGB 15.7   < > 12.2* 10.6* 9.8* 9.5* 9.0*  HCT 48.1   < > 37.9* 34.0* 31.1* 29.8* 28.7*  MCV 90.1  --  93.8  --   --   --  95.0  PLT 252  --  127*  --   --   --  96*   < > = values in this interval not displayed.   Basic Metabolic Panel: Recent Labs  Lab 12/04/19 1959 12/05/19 0539 12/06/19 0408  NA 143 144 143  K 3.7 3.6 3.4*  CL 98 103 105  CO2 30 28 28   GLUCOSE 125* 135* 90  BUN 8 12 10   CREATININE 1.00 0.88  0.90  CALCIUM 9.7 8.0* 7.7*   GFR: Estimated Creatinine Clearance: 101.3 mL/min (by C-G formula based on SCr of 0.9 mg/dL). Liver Function Tests: Recent Labs  Lab 12/04/19 1959 12/05/19 0539 12/06/19 0408  AST 49* 35 36  ALT 23 18 15   ALKPHOS 63 47 37*  BILITOT 1.6* 1.4* 1.2  PROT 8.4* 6.4* 5.0*  ALBUMIN 3.8 2.8* 2.4*   No results for input(s): LIPASE, AMYLASE in the last 168 hours. Recent Labs  Lab 12/04/19 2009  AMMONIA 82*   Coagulation Profile: Recent Labs  Lab 12/04/19 1959 12/06/19 0408  INR 1.4* 1.6*   Cardiac Enzymes: No results for input(s): CKTOTAL, CKMB, CKMBINDEX, TROPONINI in the last 168 hours. BNP (last 3 results) No results for input(s): PROBNP in the last 8760 hours. HbA1C: No results  for input(s): HGBA1C in the last 72 hours. CBG: No results for input(s): GLUCAP in the last 168 hours. Lipid Profile: No results for input(s): CHOL, HDL, LDLCALC, TRIG, CHOLHDL, LDLDIRECT in the last 72 hours. Thyroid Function Tests: No results for input(s): TSH, T4TOTAL, FREET4, T3FREE, THYROIDAB in the last 72 hours. Anemia Panel: No results for input(s): VITAMINB12, FOLATE, FERRITIN, TIBC, IRON, RETICCTPCT in the last 72 hours. Sepsis Labs: No results for input(s): PROCALCITON, LATICACIDVEN in the last 168 hours.  Recent Results (from the past 240 hour(s))  SARS Coronavirus 2 by RT PCR (hospital order, performed in [facility] hospital lab) Nasopharyngeal Nasopharyngeal Swab     Status: None   Collection Time: 12/04/19  8:09 PM   Specimen: Nasopharyngeal Swab  Result Value Ref Range Status   SARS Coronavirus 2 NEGATIVE NEGATIVE Final    Comment: (NOTE) SARS-CoV-2 target nucleic acids are NOT DETECTED.  The SARS-CoV-2 RNA is generally detectable in upper and lower respiratory specimens during the acute phase of infection. The lowest concentration of SARS-CoV-2 viral copies this assay can detect is 250 copies / mL. A negative result does not preclude  SARS-CoV-2 infection and should not be used as the sole basis for treatment or other patient management decisions.  A negative result may occur with improper specimen collection / handling, submission of specimen other than nasopharyngeal swab, presence of viral mutation(s) within the areas targeted by this assay, and inadequate number of viral copies (<250 copies / mL). A negative result must be combined with clinical observations, patient history, and epidemiological information.  Fact Sheet for Patients:   BoilerBrush.com.cy  Fact Sheet for Healthcare Providers: https://pope.com/  This test is not yet approved or  cleared by the [place] FDA and has been authorized for detection and/or diagnosis of SARS-CoV-2 by FDA under an Emergency Use Authorization (EUA).  This EUA will remain in effect (meaning this test can be used) for the duration of the COVID-19 declaration under Section 564(b)(1) of the Act, 21 U.S.C. section 360bbb-3(b)(1), unless the authorization is terminated or revoked sooner.  Performed at [facility], [address], [place], [place] [address]      Radiology Studies: DG Chest Portable 1 View  Result Date: 12/04/2019 CLINICAL DATA:  Multiple episodes of emesis.  Hematemesis. EXAM: PORTABLE CHEST 1 VIEW COMPARISON:  None. FINDINGS: The heart size and mediastinal contours are within normal limits. Both lungs are clear. The visualized skeletal structures are unremarkable. IMPRESSION: No acute cardiopulmonary process. Electronically Signed   By: Genevive Bi M.D.   On: 12/04/2019 20:55    Scheduled Meds:  ferrous sulfate  325 mg Oral Daily   lactulose  15 g Oral BID   magnesium oxide  400 mg Oral BID   multivitamin with minerals  1 tablet Oral Daily   nadolol  20 mg Oral Daily   pantoprazole (PROTONIX) IV  40 mg Intravenous Q24H   potassium chloride  40 mEq Oral Once   sertraline  100 mg  Oral Daily   Continuous Infusions:  cefTRIAXone (ROCEPHIN)  IV Stopped (12/05/19 2227)   lactated ringers 100 mL/hr at 12/05/19 2238   octreotide  (SANDOSTATIN)    IV infusion 50 mcg/hr (12/06/19 0302)     LOS: 2 days    Time spent: 25 mins.    Cipriano Bunker, MD [facility]   If 7PM-7AM, please contact night-coverage

## 2019-12-06 NOTE — Progress Notes (Signed)
pr [facility] Gastroenterology Progress Note  [name] 38 y.o. 1980-05-21  CC:  Variceal bleeding  Subjective: Patient had several episodes of rectal bleeding last night.  Reports a brown stool this morning.  Denies any further hematemesis.  No abdominal pain, chest pain, or shortness of breath.  Today, patient states he used alcohol a few days ago (rather than a few weeks ago).  ROS : Review of Systems  Respiratory: Negative for cough and shortness of breath.   Cardiovascular: Negative for chest pain and palpitations.  Gastrointestinal: Positive for blood in stool, diarrhea and melena. Negative for abdominal pain, constipation, heartburn, nausea and vomiting.    Objective: Vital signs in last 24 hours: Vitals:   12/06/19 0719 12/06/19 0915  BP:  117/76  Pulse: 72 74  Resp: 15 14  Temp:  98.4 F (36.9 C)  SpO2: 94% 96%    Physical Exam:  General:  Lethargic, oriented, cooperative, no acute distress, appears stated age  Head:  Normocephalic, without obvious abnormality, atraumatic  Eyes:  Anicteric sclera, EOMs intact  Lungs:   Clear to auscultation bilaterally, respirations unlabored  Heart:  Regular rate and rhythm, S1, S2 normal  Abdomen:   Soft, non-tender, non-distended, bowel sounds active all four quadrants,  no guarding or peritoneal signs  Extremities: Extremities normal, atraumatic, no  edema  Pulses: 2+ and symmetric    Lab Results: Recent Labs    12/05/19 0539 12/06/19 0408  NA 144 143  K 3.6 3.4*  CL 103 105  CO2 28 28  GLUCOSE 135* 90  BUN 12 10  CREATININE 0.88 0.90  CALCIUM 8.0* 7.7*   Recent Labs    12/05/19 0539 12/06/19 0408  AST 35 36  ALT 18 15  ALKPHOS 47 37*  BILITOT 1.4* 1.2  PROT 6.4* 5.0*  ALBUMIN 2.8* 2.4*   Recent Labs    12/04/19 1959 12/04/19 2306 12/05/19 0539 12/05/19 1200 12/06/19 0043 12/06/19 0408  WBC 12.8*   < > 11.0*  --   --  7.8  NEUTROABS 9.8*  --   --   --   --   --   HGB 15.7   < > 12.2*   < >  9.5* 9.0*  HCT 48.1   < > 37.9*   < > 29.8* 28.7*  MCV 90.1   < > 93.8  --   --  95.0  PLT 252   < > 127*  --   --  96*   < > = values in this interval not displayed.   Recent Labs    12/04/19 1959 12/06/19 0408  LABPROT 17.0* 18.7*  INR 1.4* 1.6*    Assessment: Variceal bleeding: EGD yesterday revealed large (> 5 mm) esophageal varices spurting blood. Incompletely eradicated. Banded. Portal hypertensive gastropathy treated with APC. -Hgb 9.0 today, decreased from 12.2 yesterday -Patient hemodynamically stable -CT-A pending  Plan: CT-A read pending.   Based on findings, consider TIPS.  Consider octreotide drip and IV Protonix BID.  Continue to monitor H&H with transfusion as needed to maintain Hgb >7.  [facility] will follow.  Edrick Kins PA-C 12/06/2019, 10:09 AM  Contact #  [phone]

## 2019-12-07 DIAGNOSIS — I8501 Esophageal varices with bleeding: Secondary | ICD-10-CM

## 2019-12-07 LAB — CBC
HCT: 30.1 % — ABNORMAL LOW (ref 39.0–52.0)
Hemoglobin: 9.8 g/dL — ABNORMAL LOW (ref 13.0–17.0)
MCH: 30.9 pg (ref 26.0–34.0)
MCHC: 32.6 g/dL (ref 30.0–36.0)
MCV: 95 fL (ref 80.0–100.0)
Platelets: 87 10*3/uL — ABNORMAL LOW (ref 150–400)
RBC: 3.17 MIL/uL — ABNORMAL LOW (ref 4.22–5.81)
RDW: 15.3 % (ref 11.5–15.5)
WBC: 6.3 10*3/uL (ref 4.0–10.5)
nRBC: 0 % (ref 0.0–0.2)

## 2019-12-07 LAB — COMPREHENSIVE METABOLIC PANEL
ALT: 15 U/L (ref 0–44)
AST: 46 U/L — ABNORMAL HIGH (ref 15–41)
Albumin: 2.5 g/dL — ABNORMAL LOW (ref 3.5–5.0)
Alkaline Phosphatase: 36 U/L — ABNORMAL LOW (ref 38–126)
Anion gap: 8 (ref 5–15)
BUN: <5 mg/dL — ABNORMAL LOW (ref 6–20)
CO2: 29 mmol/L (ref 22–32)
Calcium: 8.2 mg/dL — ABNORMAL LOW (ref 8.9–10.3)
Chloride: 105 mmol/L (ref 98–111)
Creatinine, Ser: 0.86 mg/dL (ref 0.61–1.24)
GFR calc Af Amer: >60 mL/min (ref >60)
GFR calc non Af Amer: >60 mL/min (ref >60)
Glucose, Bld: 158 mg/dL — ABNORMAL HIGH (ref 70–99)
Potassium: 3.3 mmol/L — ABNORMAL LOW (ref 3.5–5.1)
Sodium: 142 mmol/L (ref 135–145)
Total Bilirubin: 1.1 mg/dL (ref 0.3–1.2)
Total Protein: 5.4 g/dL — ABNORMAL LOW (ref 6.5–8.1)

## 2019-12-07 LAB — HEMOGLOBIN AND HEMATOCRIT, BLOOD
HCT: 28.4 % — ABNORMAL LOW (ref 39.0–52.0)
Hemoglobin: 9.2 g/dL — ABNORMAL LOW (ref 13.0–17.0)

## 2019-12-07 LAB — PHOSPHORUS: Phosphorus: 3.1 mg/dL (ref 2.5–4.6)

## 2019-12-07 LAB — MAGNESIUM: Magnesium: 1.5 mg/dL — ABNORMAL LOW (ref 1.7–2.4)

## 2019-12-07 NOTE — Progress Notes (Signed)
pr [facility] Gastroenterology Progress Note  [name] 39 y.o. May 05, 1980  CC:  Variceal bleeding  Subjective: Denies any further hematemesis.  Denies black stools, states he had a brown BM this morning.  No abdominal pain, chest pain, or shortness of breath.    ROS : Review of Systems  Respiratory: Negative for cough and shortness of breath.   Cardiovascular: Negative for chest pain and palpitations.  Gastrointestinal: Positive for diarrhea (chronic, due to lactulose). Negative for abdominal pain, blood in stool, constipation, heartburn, melena, nausea and vomiting.    Objective: Vital signs in last 24 hours: Vitals:   12/06/19 2358 12/07/19 0513  BP: 118/77 117/83  Pulse: 75 70  Resp: 18 18  Temp: 98.1 F (36.7 C) 97.7 F (36.5 C)  SpO2: 97% 98%    Physical Exam:  General:  Lethargic, oriented, cooperative, no acute distress, appears stated age  Head:  Normocephalic, without obvious abnormality, atraumatic  Eyes:  Anicteric sclera, EOMs intact  Lungs:   Clear to auscultation bilaterally, respirations unlabored  Heart:  Regular rate and rhythm, S1, S2 normal  Abdomen:   Soft, non-tender, non-distended, bowel sounds active all four quadrants,  no guarding or peritoneal signs  Extremities: Extremities normal, atraumatic, no  edema  Pulses: 2+ and symmetric    Lab Results: Recent Labs    12/06/19 0408 12/07/19 0749  NA 143 142  K 3.4* 3.3*  CL 105 105  CO2 28 29  GLUCOSE 90 158*  BUN 10 <5*  CREATININE 0.90 0.86  CALCIUM 7.7* 8.2*  MG  --  1.5*  PHOS  --  3.1   Recent Labs    12/06/19 0408 12/07/19 0749  AST 36 46*  ALT 15 15  ALKPHOS 37* 36*  BILITOT 1.2 1.1  PROT 5.0* 5.4*  ALBUMIN 2.4* 2.5*   Recent Labs    12/04/19 1959 12/04/19 2306 12/06/19 0408 12/06/19 1740 12/07/19 0032 12/07/19 0749  WBC 12.8*   < > 7.8  --   --  6.3  NEUTROABS 9.8*  --   --   --   --   --   HGB 15.7   < > 9.0*   < > 9.2* 9.8*  HCT 48.1   < > 28.7*   < > 28.4*  30.1*  MCV 90.1   < > 95.0  --   --  95.0  PLT 252   < > 96*  --   --  87*   < > = values in this interval not displayed.   Recent Labs    12/04/19 1959 12/06/19 0408  LABPROT 17.0* 18.7*  INR 1.4* 1.6*    Assessment: Variceal bleeding, resolved: EGD 8/9 revealed large (> 5 mm) esophageal varices spurting blood. Incompletely eradicated. Banded. Portal hypertensive gastropathy treated with APC. -Hgb 9.8 today as compared to 9.0 yesterday -Patient hemodynamically stable -CT-A 8/10: Cirrhosis with splenomegaly, small volume ascites, and esophageal varices.  Plan: Discussed with IR.  Since patient is stable and without ongoing bleeding, they will arrange outpatient consult for consideration of TIPS as an outpatient.  Appreciate IR input.  Continue octreotide drip and IV Protonix BID for now.  Recommend overnight observation, possible discharge tomorrow pending clinical status.  Continue to monitor H&H with transfusion as needed to maintain Hgb >7.  [facility] will follow.  Edrick Kins PA-C 12/07/2019, 10:58 AM  Contact #  [phone]

## 2019-12-07 NOTE — Progress Notes (Signed)
PROGRESS NOTE    [name]  YWV:[id number] DOB: Jul 04, 1980 DOA: 12/04/2019 PCP: James Has, MD    Brief Narrative: [name] is a 39 y.o. male with medical history significant for alcoholic cirrhosis, esophageal varices, GERD, previous blood transfusion secondary to esophageal variceal bleeding.  [name] states that at 4 pm yesterday he started to have hematemesis.He was just sitting at home when the symptoms spontaneously started.  Second episode of hematemesis at home and decide then come to the emergency room.  In the emergency room he Best had 3 further episodes of hematemesis.  He states he is filled out for emesis bags with bright red blood.He reports a history of upper GI bleed due to esophageal varices. This is due to alcohol use. He states he Best not had any alcohol in several weeks. He denies other drug use. He states he Best been taking most of his medications as prescribed, including his Protonix, though he Best not yet had it today. He follows with [facility]. He was last in the hospital several months ago for the same. He denies fevers, chills, chest pain, shortness breath, abdominal pain, urinary symptoms, abnormal bowel movements. He is not on blood thinner.  Denies any recent abdominal trauma. ED Course: Hemodynamically stable in the emergency room other than some mild tachycardia.  He was started on octreotide and IV Protonix.  Patient underwent EGD found to have active variceal bleed, treated with 5 bands placement, IR consulted for possible TIPS procedure if he continues to have ongoing bleeding.  Patient remained asymptomatic, denies any further GI bleeding.  TIPS procedure Best to be scheduled outpatient.   Assessment & Plan:   Active Problems:   Acute GI bleeding   Alcohol abuse   Alcoholic cirrhosis (HCC)   Tachycardia   Bleeding esophageal varices (HCC)   Upper GI bleeding  Acute GI bleeding from esophageal varices :  Improved. [name]  is admitted to progressive unit. Multiple episodes of hematemesis while in the emergency room.   Hemodynamically he is stable.  He does have mild tachycardia. Continue IV fluid hydration. Monitor H&H every 6 hours. Hb 9.0 today Patient started on octreotide as he Best history of bleeding esophageal varices. Gastroenterology was consulted, patient underwent EGD found to have bleeding varices underwent banding. IR consulted for TIPS procedure if patient continues to have ongoing bleeding, Continue octreotide, Protonix and antibiotic for total 72 hours. Avoid over transfusion, transfuse if hemoglobin drops below 7. Start clear liquid diet, advance to full liquid diet. Patient denies any further GI bleeding,  Anticipated discharge home tomorrow. Repeat EGD in 4 weeks for esophageal banding. This procedure Best to be scheduled outpatient unless having active ongoing bleeding.   Alcoholic cirrhosis (HCC) Chronic  Bleeding esophageal varices [facility]) Gastroenterology consulted.  Octreotide therapy as above.  PPI therapy as above. Patient may benefit from TIPS procedure, interventional radiology is consulted.  Tachycardia - Improved. IV fluid hydration provided.  We will continue to monitor on telemetry.  Alcohol abuse With chronic alcoholism.  He does have known alcoholic cirrhosis.   Consult discharge planning for assistance with outpatient alcohol rehabilitation programs for patient   DVT prophylaxis: SCDS Code Status: Full Family Communication: Discussed with patient in detail. Disposition Plan: Anticipated discharge home tomorrow Anticipated discharge home once medically clear.  Ongoing GI work-up.  Consultants:   Gastroenterology [name]  Procedures: EGD with banding  Antimicrobials:  Anti-infectives (From admission, onward)   Start     Dose/Rate Route  Frequency Ordered Stop   12/05/19 2200  cefTRIAXone (ROCEPHIN) 2 g in sodium chloride 0.9 % 100 mL IVPB      Discontinue     2 g 200 mL/hr over 30 Minutes Intravenous Every 24 hours 12/05/19 1121     12/04/19 2130  cefTRIAXone (ROCEPHIN) 1 g in sodium chloride 0.9 % 100 mL IVPB        1 g 200 mL/hr over 30 Minutes Intravenous  Once 12/04/19 2119 12/04/19 2301      Subjective: Patient was seen and examined at bedside.  No overnight events.  He denies any further GI bleeding.  He reports feeling much better and wants to be discharged.  Objective: Vitals:   12/06/19 1805 12/06/19 2358 12/07/19 0513 12/07/19 1214  BP: (!) 148/94 118/77 117/83 115/82  Pulse: 84 75 70 73  Resp: 18 18 18 16   Temp: 98.3 F (36.8 C) 98.1 F (36.7 C) 97.7 F (36.5 C) 98.7 F (37.1 C)  TempSrc: Oral Oral Oral Oral  SpO2: 100% 97% 98% 98%  Weight:      Height:        Intake/Output Summary (Last 24 hours) at 12/07/2019 1518 Last data filed at 12/07/2019 0303 Gross per 24 hour  Intake 4809.07 ml  Output --  Net 4809.07 ml   Filed Weights   12/04/19 1951  Weight: 65 kg    Examination:  General exam: Appears calm and comfortable  Respiratory system: Clear to auscultation. Respiratory effort normal. Cardiovascular system: S1 & S2 heard, RRR. No JVD, murmurs, rubs, gallops or clicks. No pedal edema. Gastrointestinal system: Abdomen is nondistended, soft and nontender. No organomegaly or masses felt. Normal bowel sounds heard. Central nervous system: Alert and oriented. No focal neurological deficits. Extremities: Symmetric 5 x 5 power. Skin: No rashes, lesions or ulcers Psychiatry: Judgement and insight appear normal. Mood & affect appropriate.     Data Reviewed: I have personally reviewed following labs and imaging studies  CBC: Recent Labs  Lab 12/04/19 1959 12/04/19 2306 12/05/19 0539 12/05/19 1200 12/06/19 0043 12/06/19 0408 12/06/19 1740 12/07/19 0032 12/07/19 0749  WBC 12.8*  --  11.0*  --   --  7.8  --   --  6.3  NEUTROABS 9.8*  --   --   --   --   --   --   --   --   HGB 15.7   < >  12.2*   < > 9.5* 9.0* 9.6* 9.2* 9.8*  HCT 48.1   < > 37.9*   < > 29.8* 28.7* 30.6* 28.4* 30.1*  MCV 90.1  --  93.8  --   --  95.0  --   --  95.0  PLT 252  --  127*  --   --  96*  --   --  87*   < > = values in this interval not displayed.   Basic Metabolic Panel: Recent Labs  Lab 12/04/19 1959 12/05/19 0539 12/06/19 0408 12/07/19 0749  NA 143 144 143 142  K 3.7 3.6 3.4* 3.3*  CL 98 103 105 105  CO2 30 28 28 29   GLUCOSE 125* 135* 90 158*  BUN 8 12 10  <5*  CREATININE 1.00 0.88 0.90 0.86  CALCIUM 9.7 8.0* 7.7* 8.2*  MG  --   --   --  1.5*  PHOS  --   --   --  3.1   GFR: Estimated Creatinine Clearance: 106 mL/min (by C-G formula based on SCr  of 0.86 mg/dL). Liver Function Tests: Recent Labs  Lab 12/04/19 1959 12/05/19 0539 12/06/19 0408 12/07/19 0749  AST 49* 35 36 46*  ALT 23 18 15 15   ALKPHOS 63 47 37* 36*  BILITOT 1.6* 1.4* 1.2 1.1  PROT 8.4* 6.4* 5.0* 5.4*  ALBUMIN 3.8 2.8* 2.4* 2.5*   No results for input(s): LIPASE, AMYLASE in the last 168 hours. Recent Labs  Lab 12/04/19 2009  AMMONIA 82*   Coagulation Profile: Recent Labs  Lab 12/04/19 1959 12/06/19 0408  INR 1.4* 1.6*   Cardiac Enzymes: No results for input(s): CKTOTAL, CKMB, CKMBINDEX, TROPONINI in the last 168 hours. BNP (last 3 results) No results for input(s): PROBNP in the last 8760 hours. HbA1C: No results for input(s): HGBA1C in the last 72 hours. CBG: No results for input(s): GLUCAP in the last 168 hours. Lipid Profile: No results for input(s): CHOL, HDL, LDLCALC, TRIG, CHOLHDL, LDLDIRECT in the last 72 hours. Thyroid Function Tests: No results for input(s): TSH, T4TOTAL, FREET4, T3FREE, THYROIDAB in the last 72 hours. Anemia Panel: No results for input(s): VITAMINB12, FOLATE, FERRITIN, TIBC, IRON, RETICCTPCT in the last 72 hours. Sepsis Labs: No results for input(s): PROCALCITON, LATICACIDVEN in the last 168 hours.  Recent Results (from the past 240 hour(s))  SARS Coronavirus 2 by  RT PCR (hospital order, performed in [facility] hospital lab) Nasopharyngeal Nasopharyngeal Swab     Status: None   Collection Time: 12/04/19  8:09 PM   Specimen: Nasopharyngeal Swab  Result Value Ref Range Status   SARS Coronavirus 2 NEGATIVE NEGATIVE Final    Comment: (NOTE) SARS-CoV-2 target nucleic acids are NOT DETECTED.  The SARS-CoV-2 RNA is generally detectable in upper and lower respiratory specimens during the acute phase of infection. The lowest concentration of SARS-CoV-2 viral copies this assay can detect is 250 copies / mL. A negative result does not preclude SARS-CoV-2 infection and should not be used as the sole basis for treatment or other patient management decisions.  A negative result may occur with improper specimen collection / handling, submission of specimen other than nasopharyngeal swab, presence of viral mutation(s) within the areas targeted by this assay, and inadequate number of viral copies (<250 copies / mL). A negative result must be combined with clinical observations, patient history, and epidemiological information.  Fact Sheet for Patients:   02/03/20  Fact Sheet for Healthcare Providers: BoilerBrush.com.cy  This test is not yet approved or  cleared by the https://pope.com/ FDA and Best been authorized for detection and/or diagnosis of SARS-CoV-2 by FDA under an Emergency Use Authorization (EUA).  This EUA will remain in effect (meaning this test can be used) for the duration of the COVID-19 declaration under Section 564(b)(1) of the Act, 21 U.S.C. section 360bbb-3(b)(1), unless the authorization is terminated or revoked sooner.  Performed at [facility], [address], [place], [place]      Radiology Studies: CT Angio Abd/Pel w/ and/or w/o  Result Date: 12/06/2019 CLINICAL DATA:  Alcoholic cirrhosis, recurrent ascites, acute upper GI bleed status post banding of  esophageal varices. EXAM: CTA ABDOMEN AND PELVIS WITHOUT AND WITH CONTRAST TECHNIQUE: Multidetector CT imaging of the abdomen and pelvis was performed using the standard protocol during bolus administration of intravenous contrast. Multiplanar reconstructed images and MIPs were obtained and reviewed to evaluate the vascular anatomy. CONTRAST:  02/05/2020 OMNIPAQUE IOHEXOL 350 MG/ML SOLN COMPARISON:  09/18/2011 FINDINGS: VASCULAR Aorta: Normal caliber aorta without aneurysm, dissection, vasculitis or significant stenosis. Celiac: Patent without  evidence of aneurysm, dissection, vasculitis or significant stenosis. SMA: Patent without evidence of aneurysm, dissection, vasculitis or significant stenosis. Renals: Both renal arteries are patent without evidence of aneurysm, dissection, vasculitis, fibromuscular dysplasia or significant stenosis. IMA: Patent without evidence of aneurysm, dissection, vasculitis or significant stenosis. Inflow: Patent without evidence of aneurysm, dissection, vasculitis or significant stenosis. Proximal Outflow: Bilateral common femoral and visualized portions of the superficial and profunda femoral arteries are patent without evidence of aneurysm, dissection, vasculitis or significant stenosis. Veins: Hepatic veins patent. Right hepatic vein is diminutive and there are 2 accessory right hepatic veins draining segments 5 and 6. Portal vein patent. SMV and splenic vein patent. Enlarged left gastric vein supplies esophageal varices. No gastric varices identified. No splenorenal shunt. Patent renal veins, IVC, iliac venous system. Review of the MIP images confirms the above findings. NON-VASCULAR Lower chest: Circumferential wall thickening in the distal esophagus. No pleural or pericardial effusion. Lung bases clear. Hepatobiliary: Nodular contour suggesting cirrhosis. No focal lesion. No biliary ductal dilatation. Gallbladder is physiologically distended. Pancreas: Unremarkable. No pancreatic  ductal dilatation or surrounding inflammatory changes. Spleen: 18.3 cm length, no focal lesion Adrenals/Urinary Tract: Adrenal glands unremarkable. Kidneys enhance normally. No hydronephrosis. Urinary bladder physiologically distended. Stomach/Bowel: Stomach distended by ingested material. No gastric varices. Small bowel is nondilated. Appendix not discretely identified. No pericecal inflammatory/edematous change. The colon is nondilated, unremarkable. Lymphatic: No abdominal or pelvic adenopathy. Reproductive: Prostate is unremarkable. Other: Bilateral pelvic phleboliths. Small volume perihepatic ascites. No free air. Musculoskeletal: No acute or significant osseous findings. IMPRESSION: 1. Cirrhosis with splenomegaly, small volume ascites, and esophageal varices. Electronically Signed   By: Corlis Leak M.D.   On: 12/06/2019 14:36    Scheduled Meds: . ferrous sulfate  325 mg Oral Daily  . lactulose  15 g Oral BID  . magnesium oxide  400 mg Oral BID  . multivitamin with minerals  1 tablet Oral Daily  . nadolol  20 mg Oral Daily  . pantoprazole (PROTONIX) IV  40 mg Intravenous Q24H  . potassium chloride  40 mEq Oral Once  . sertraline  100 mg Oral Daily   Continuous Infusions: . cefTRIAXone (ROCEPHIN)  IV 2 g (12/06/19 2152)  . lactated ringers 100 mL/hr at 12/06/19 2337  . octreotide  (SANDOSTATIN)    IV infusion 50 mcg/hr (12/07/19 0956)     LOS: 3 days    Time spent: 25 mins.    Cipriano Bunker, MD [facility]   If 7PM-7AM, please contact night-coverage

## 2019-12-07 NOTE — Progress Notes (Signed)
   New order for TIPs procedure in IR was received today  I visited with pt He states he is feeling well No hematemesis since banding 12/04/19 He has had 2 melanic stools on 8/8 after procedure -- none since Hg 9.8 today 118/77; P 75; 98.1; R 18  Chat messaged with Dr Levora Angel We will HOLD on TIPs for now  We can see pt as OP in IR Clinic  Will discuss TIPs and needs at that time  Pt and Dr Levora Angel aware and all agreeable

## 2019-12-08 ENCOUNTER — Other Ambulatory Visit (HOSPITAL_COMMUNITY): Payer: Self-pay

## 2019-12-08 LAB — BASIC METABOLIC PANEL
Anion gap: 6 (ref 5–15)
BUN: <5 mg/dL — ABNORMAL LOW (ref 6–20)
CO2: 31 mmol/L (ref 22–32)
Calcium: 8.2 mg/dL — ABNORMAL LOW (ref 8.9–10.3)
Chloride: 104 mmol/L (ref 98–111)
Creatinine, Ser: 0.96 mg/dL (ref 0.61–1.24)
GFR calc Af Amer: >60 mL/min (ref >60)
GFR calc non Af Amer: >60 mL/min (ref >60)
Glucose, Bld: 102 mg/dL — ABNORMAL HIGH (ref 70–99)
Potassium: 4.1 mmol/L (ref 3.5–5.1)
Sodium: 141 mmol/L (ref 135–145)

## 2019-12-08 LAB — TYPE AND SCREEN
ABO/RH(D): O NEG
Antibody Screen: POSITIVE
DAT, IgG: POSITIVE
Donor AG Type: NEGATIVE
Donor AG Type: NEGATIVE
Unit division: 0
Unit division: 0

## 2019-12-08 LAB — BPAM RBC
Blood Product Expiration Date: 202108222359
Blood Product Expiration Date: 202108222359
Unit Type and Rh: 9500
Unit Type and Rh: 9500

## 2019-12-08 LAB — CBC
HCT: 31.4 % — ABNORMAL LOW (ref 39.0–52.0)
Hemoglobin: 10 g/dL — ABNORMAL LOW (ref 13.0–17.0)
MCH: 30.3 pg (ref 26.0–34.0)
MCHC: 31.8 g/dL (ref 30.0–36.0)
MCV: 95.2 fL (ref 80.0–100.0)
Platelets: 87 10*3/uL — ABNORMAL LOW (ref 150–400)
RBC: 3.3 MIL/uL — ABNORMAL LOW (ref 4.22–5.81)
RDW: 15.5 % (ref 11.5–15.5)
WBC: 6.4 10*3/uL (ref 4.0–10.5)
nRBC: 0 % (ref 0.0–0.2)

## 2019-12-08 SURGERY — IR WITH ANESTHESIA
Anesthesia: General

## 2019-12-08 MED ORDER — PANTOPRAZOLE SODIUM 40 MG PO TBEC: 40.0000 mg | DELAYED_RELEASE_TABLET | Freq: Every day | ORAL | 1 refills | Status: DC

## 2019-12-08 MED ORDER — PANTOPRAZOLE SODIUM 40 MG PO TBEC: 40.0000 mg | DELAYED_RELEASE_TABLET | Freq: Every day | ORAL | Status: DC

## 2019-12-08 NOTE — Discharge Summary (Signed)
Physician Discharge Summary  [name] TXM:[id number] DOB: Apr 20, 1981 DOA: 12/04/2019  PCP: Farris Has, MD  Admit date: 12/04/2019  Discharge date: 12/08/2019  Admitted From: Home.  Disposition:  Home  Recommendations for Outpatient Follow-up:  Follow up with PCP in 1-2 weeks. Please obtain BMP/CBC in one week. 3.   Advised to follow-up with gastroenterology in 2 to 4 weeks. 4.   Recommend repeat EGD in 2 to 4 weeks for further band ligation.  5.   Advised to take Protonix 40 mg p.o. daily 6.   Advised to abstain from alcohol. Counseling completed  Home Health: None Equipment/Devices: None Discharge Condition: Stable. CODE STATUS:Full code Diet recommendation: Heart Healthy   Brief Summary: [name] is a 39 y.o. male with medical history significant for alcoholic cirrhosis, esophageal varices, GERD, previous blood transfusion secondary to esophageal variceal bleeding.  [name] states that at 4 pm yesterday he started to have hematemesis. He was just sitting at home when the symptoms spontaneously started.  Second episode of hematemesis at home and then  he decided to come to the emergency room.  In the emergency room he has had 3 further episodes of hematemesis.  He states he has filled emesis bags with bright red blood. He reports a history of upper GI bleed due to esophageal varices.  This is due to alcohol use.  He states he has not had any alcohol in several weeks.  He denies other drug use.  He states he has been taking most of his medications as prescribed, including his Protonix, though he has not yet had it today.  He follows with [facility].  He was last in the hospital several months ago for the same.  He denies fevers, chills, chest pain, shortness breath, abdominal pain, urinary symptoms, abnormal bowel movements.  He is not on blood thinner.  Denies any recent abdominal trauma.  Hospital course: Admitted for acute GI bleeding. ED Course: Hemodynamically  stable in the emergency room other than some mild tachycardia.  He was started on octreotide and IV Protonix infusion. Gastroenterology was consulted.  Patient underwent EGD and found to have active variceal bleeding, treated with 5 bands placement, but patient continued to have bleeding,  He had 2 episodes of  bloody vomitus after EGD.  IR consulted for possible TIPS procedure if he continues to have ongoing bleeding.  Patient remained asymptomatic,  he had three melanotic stools  next day , later denies any further GI bleeding.  TIPS procedure was kept on hold.  Patient continues to remain stable,  denies any further GI bleeding.  Hemoglobin remains stable above 9.  Patient was given antibiotics, Protonix and octreotide infusion for 3 days as per GI recommendation.  Patient tolerated soft diet,  denies any further GI bleeding.  Patient is cleared from gastroenterology to be discharged home.  Patient will follow up with gastroenterology in 2 to 4 weeks for repeat EGD for variceal banding.  Patient will follow up with IR as an outpatient.  Patient reports feeling much better and wants to be discharged home.   He was managed for below problems.  Discharge Diagnoses:  Active Problems:   Alcohol abuse   Alcoholic cirrhosis (HCC)   Bleeding esophageal varices (HCC)  Acute GI bleeding from esophageal varices :  Improved. [name] is admitted to progressive unit. Multiple episodes of hematemesis while in the emergency room.   Hemodynamically he is stable.  He does have mild tachycardia. Continue IV fluid hydration.  Monitor H&H every 6 hours. Hb 10.0 today Patient started on octreotide as he has history of bleeding esophageal varices. Gastroenterology was consulted, patient underwent EGD found to have bleeding varices underwent banding. IR consulted for TIPS procedure if patient continues to have ongoing bleeding, Continue octreotide, Protonix and antibiotic for total 72 hours. Avoid over  transfusion, transfuse if hemoglobin drops below 7. Start clear liquid diet, advance to full liquid diet. Patient denies any further GI bleeding,  Anticipated discharge home tomorrow. Repeat EGD in 4 weeks for esophageal banding. TIPS procedure has to be scheduled outpatient unless having active ongoing bleeding.     Alcoholic cirrhosis (HCC) Chronic   Bleeding esophageal varices [facility]) Gastroenterology consulted.  Octreotide therapy as above.  PPI therapy as above. Patient may benefit from TIPS procedure, interventional radiology is consulted.   Tachycardia - Improved. IV fluid hydration provided.  We will continue to monitor on telemetry.   Alcohol abuse With chronic alcoholism.  He does have known alcoholic cirrhosis.   Consult discharge planning for assistance with outpatient alcohol rehabilitation programs for patient  Discharge Instructions  Discharge Instructions     Call MD for:  difficulty breathing, headache or visual disturbances   Complete by: As directed    Call MD for:  persistant dizziness or light-headedness   Complete by: As directed    Call MD for:  persistant nausea and vomiting   Complete by: As directed    Diet - low sodium heart healthy   Complete by: As directed    Discharge instructions   Complete by: As directed    Advised to follow-up with primary care physician in 1 week. Advised to follow-up with gastroenterology in 2 to 4 weeks for the variceal banding. Advised to take Protonix 40 mg p.o. daily. Advised to abstain from alcohol intake patient understood and agreed.   Increase activity slowly   Complete by: As directed       Allergies as of 12/08/2019       Reactions   Penicillins Hives        Medication List     STOP taking these medications    omeprazole 20 MG capsule Commonly known as: PRILOSEC   omeprazole 20 MG tablet Commonly known as: PRILOSEC OTC       TAKE these medications    Iron 325 (65 Fe) MG Tabs Take 1  tablet (325 mg total) by mouth daily.   lactulose 10 GM/15ML solution Commonly known as: CHRONULAC Take 45 mLs (30 g total) by mouth 2 (two) times daily. What changed: how much to take   magnesium oxide 400 (241.3 Mg) MG tablet Commonly known as: MAG-OX Take 1 tablet (400 mg total) by mouth 2 (two) times daily.   multivitamin with minerals Tabs tablet Take 1 tablet by mouth daily.   nadolol 20 MG tablet Commonly known as: CORGARD Take 1 tablet (20 mg total) by mouth daily.   pantoprazole 40 MG tablet Commonly known as: PROTONIX Take 1 tablet (40 mg total) by mouth daily. Start taking on: December 09, 2019   potassium chloride SA 20 MEQ tablet Commonly known as: KLOR-CON Take 1 tablet (20 mEq total) by mouth daily.   sertraline 100 MG tablet Commonly known as: ZOLOFT Take 1 tablet (100 mg total) by mouth daily.        Follow-up Information     Irish Lack, MD Follow up in 1 week(s).   Specialties: Interventional Radiology, Radiology Why: pt will hear from IR OP Clinic  for consult for TIPs procedure - call [phone] if any questions or concerns. Call if havent heard from scheduler in 1 week Contact information: [address] [place] [address] [phone]         Gastroenterology, Deboraha Sprang. Schedule an appointment as soon as possible for a visit in 3 week(s).   Why: For follow-up of cirrhosis and esophageal varices Contact information: [address] [place] [address] [phone]         [facility] Follow up on 12/22/2019.   Why: Your appointment is at 9:30 am. Please arrive early and bring a picture ID and your current medications. Contact information: [place] [address] [phone]        [facility] Follow up.   Why: Please use this location for your pharmacy needs. they assist with the cost of  medications. Contact information: [address] [place] [address] [phone]               Allergies  Allergen Reactions   Penicillins Hives    Consultations: Gastroenterology   Procedures/Studies: DG Chest Portable 1 View  Result Date: 12/04/2019 CLINICAL DATA:  Multiple episodes of emesis.  Hematemesis. EXAM: PORTABLE CHEST 1 VIEW COMPARISON:  None. FINDINGS: The heart size and mediastinal contours are within normal limits. Both lungs are clear. The visualized skeletal structures are unremarkable. IMPRESSION: No acute cardiopulmonary process. Electronically Signed   By: Genevive Bi M.D.   On: 12/04/2019 20:55   CT Angio Abd/Pel w/ and/or w/o  Result Date: 12/06/2019 CLINICAL DATA:  Alcoholic cirrhosis, recurrent ascites, acute upper GI bleed status post banding of esophageal varices. EXAM: CTA ABDOMEN AND PELVIS WITHOUT AND WITH CONTRAST TECHNIQUE: Multidetector CT imaging of the abdomen and pelvis was performed using the standard protocol during bolus administration of intravenous contrast. Multiplanar reconstructed images and MIPs were obtained and reviewed to evaluate the vascular anatomy. CONTRAST:  OMNIPAQUE IOHEXOL 350 MG/ML SOLN COMPARISON:  09/18/2011 FINDINGS: VASCULAR Aorta: Normal caliber aorta without aneurysm, dissection, vasculitis or significant stenosis. Celiac: Patent without evidence of aneurysm, dissection, vasculitis or significant stenosis. SMA: Patent without evidence of aneurysm, dissection, vasculitis or significant stenosis. Renals: Both renal arteries are patent without evidence of aneurysm, dissection, vasculitis, fibromuscular dysplasia or significant stenosis. IMA: Patent without evidence of aneurysm, dissection, vasculitis or significant stenosis. Inflow: Patent without evidence of aneurysm, dissection, vasculitis or significant stenosis. Proximal Outflow: Bilateral common femoral and visualized portions of the  superficial and profunda femoral arteries are patent without evidence of aneurysm, dissection, vasculitis or significant stenosis. Veins: Hepatic veins patent. Right hepatic vein is diminutive and there are 2 accessory right hepatic veins draining segments 5 and 6. Portal vein patent. SMV and splenic vein patent. Enlarged left gastric vein supplies esophageal varices. No gastric varices identified. No splenorenal shunt. Patent renal veins, IVC, iliac venous system. Review of the MIP images confirms the above findings. NON-VASCULAR Lower chest: Circumferential wall thickening in the distal esophagus. No pleural or pericardial effusion. Lung bases clear. Hepatobiliary: Nodular contour suggesting cirrhosis. No focal lesion. No biliary ductal dilatation. Gallbladder is physiologically distended. Pancreas: Unremarkable. No pancreatic ductal dilatation or surrounding inflammatory changes. Spleen: 18.3 cm length, no focal lesion Adrenals/Urinary Tract: Adrenal glands unremarkable. Kidneys enhance normally. No hydronephrosis. Urinary bladder physiologically distended. Stomach/Bowel: Stomach distended by ingested material. No gastric varices. Small bowel is nondilated. Appendix not discretely identified. No pericecal inflammatory/edematous  change. The colon is nondilated, unremarkable. Lymphatic: No abdominal or pelvic adenopathy. Reproductive: Prostate is unremarkable. Other: Bilateral pelvic phleboliths. Small volume perihepatic ascites. No free air. Musculoskeletal: No acute or significant osseous findings. IMPRESSION: 1. Cirrhosis with splenomegaly, small volume ascites, and esophageal varices. Electronically Signed   By: Corlis Leak M.D.   On: 12/06/2019 14:36   EGD   Subjective: Patient was seen and examined at bedside.  No overnight events.  He denies any further GI bleeding.  He reports feeling better,  denies nausea vomiting and diarrhea.  He wants to be discharged home.  Discharge Exam: Vitals:   12/07/19  2355 12/08/19 0628  BP: 120/85 122/86  Pulse: 71 65  Resp: 18 18  Temp: 98.5 F (36.9 C) 97.9 F (36.6 C)  SpO2: 97% 98%   Vitals:   12/07/19 1214 12/07/19 1838 12/07/19 2355 12/08/19 0628  BP: 115/82 123/82 120/85 122/86  Pulse: 73 78 71 65  Resp: 16 16 18 18   Temp: 98.7 F (37.1 C) 99.2 F (37.3 C) 98.5 F (36.9 C) 97.9 F (36.6 C)  TempSrc: Oral Oral Oral Oral  SpO2: 98% 98% 97% 98%  Weight:      Height:        General: Pt is alert, awake, not in acute distress Cardiovascular: RRR, S1/S2 +, no rubs, no gallops Respiratory: CTA bilaterally, no wheezing, no rhonchi Abdominal: Soft, NT, ND, bowel sounds + Extremities: no edema, no cyanosis    The results of significant diagnostics from this hospitalization (including imaging, microbiology, ancillary and laboratory) are listed below for reference.     Microbiology: Recent Results (from the past 240 hour(s))  SARS Coronavirus 2 by RT PCR (hospital order, performed in [facility] hospital lab) Nasopharyngeal Nasopharyngeal Swab     Status: None   Collection Time: 12/04/19  8:09 PM   Specimen: Nasopharyngeal Swab  Result Value Ref Range Status   SARS Coronavirus 2 NEGATIVE NEGATIVE Final    Comment: (NOTE) SARS-CoV-2 target nucleic acids are NOT DETECTED.  The SARS-CoV-2 RNA is generally detectable in upper and lower respiratory specimens during the acute phase of infection. The lowest concentration of SARS-CoV-2 viral copies this assay can detect is 250 copies / mL. A negative result does not preclude SARS-CoV-2 infection and should not be used as the sole basis for treatment or other patient management decisions.  A negative result may occur with improper specimen collection / handling, submission of specimen other than nasopharyngeal swab, presence of viral mutation(s) within the areas targeted by this assay, and inadequate number of viral copies (<250 copies / mL). A negative result must be combined with  clinical observations, patient history, and epidemiological information.  Fact Sheet for Patients:   02/03/20  Fact Sheet for Healthcare Providers: BoilerBrush.com.cy  This test is not yet approved or  cleared by the https://pope.com/ FDA and has been authorized for detection and/or diagnosis of SARS-CoV-2 by FDA under an Emergency Use Authorization (EUA).  This EUA will remain in effect (meaning this test can be used) for the duration of the COVID-19 declaration under Section 564(b)(1) of the Act, 21 U.S.C. section 360bbb-3(b)(1), unless the authorization is terminated or revoked sooner.  Performed at [facility], [address] [place], [place]      Labs: BNP (last 3 results) No results for input(s): BNP in the last 8760 hours. Basic Metabolic Panel: Recent Labs  Lab 12/04/19 1959 12/05/19 0539 12/06/19 0408 12/07/19 0749 12/08/19 0551  NA  143 144 143 142 141  K 3.7 3.6 3.4* 3.3* 4.1  CL 98 103 105 105 104  CO2 GLUCOSE 125* 135* 90 158* 102*  BUN <5* <5*  CREATININE 1.00 0.88 0.90 0.86 0.96  CALCIUM 9.7 8.0* 7.7* 8.2* 8.2*  MG  --   --   --  1.5*  --   PHOS  --   --   --  3.1  --    Liver Function Tests: Recent Labs  Lab 12/04/19 1959 12/05/19 0539 12/06/19 0408 12/07/19 0749  AST 49* 35 36 46*  ALT ALKPHOS 63 47 37* 36*  BILITOT 1.6* 1.4* 1.2 1.1  PROT 8.4* 6.4* 5.0* 5.4*  ALBUMIN 3.8 2.8* 2.4* 2.5*   No results for input(s): LIPASE, AMYLASE in the last 168 hours. Recent Labs  Lab 12/04/19 2009  AMMONIA 82*   CBC: Recent Labs  Lab 12/04/19 1959 12/04/19 2306 12/05/19 0539 12/05/19 1200 12/06/19 0408 12/06/19 1740 12/07/19 0032 12/07/19 0749 12/08/19 0551  WBC 12.8*  --  11.0*  --  7.8  --   --  6.3 6.4  NEUTROABS 9.8*  --   --   --   --   --   --   --   --   HGB 15.7   < > 12.2*   < > 9.0* 9.6* 9.2* 9.8* 10.0*  HCT 48.1   < >  37.9*   < > 28.7* 30.6* 28.4* 30.1* 31.4*  MCV 90.1  --  93.8  --  95.0  --   --  95.0 95.2  PLT 252  --  127*  --  96*  --   --  87* 87*   < > = values in this interval not displayed.   Cardiac Enzymes: No results for input(s): CKTOTAL, CKMB, CKMBINDEX, TROPONINI in the last 168 hours. BNP: Invalid input(s): POCBNP CBG: No results for input(s): GLUCAP in the last 168 hours. D-Dimer No results for input(s): DDIMER in the last 72 hours. Hgb A1c No results for input(s): HGBA1C in the last 72 hours. Lipid Profile No results for input(s): CHOL, HDL, LDLCALC, TRIG, CHOLHDL, LDLDIRECT in the last 72 hours. Thyroid function studies No results for input(s): TSH, T4TOTAL, T3FREE, THYROIDAB in the last 72 hours.  Invalid input(s): FREET3 Anemia work up No results for input(s): VITAMINB12, FOLATE, FERRITIN, TIBC, IRON, RETICCTPCT in the last 72 hours. Urinalysis    Component Value Date/Time   COLORURINE YELLOW 10/09/2019 0300   APPEARANCEUR CLEAR 10/09/2019 0300   LABSPEC 1.028 10/09/2019 0300   PHURINE 6.0 10/09/2019 0300   GLUCOSEU NEGATIVE 10/09/2019 0300   HGBUR NEGATIVE 10/09/2019 0300   BILIRUBINUR NEGATIVE 10/09/2019 0300   KETONESUR 20 (A) 10/09/2019 0300   PROTEINUR 30 (A) 10/09/2019 0300   NITRITE NEGATIVE 10/09/2019 0300   LEUKOCYTESUR NEGATIVE 10/09/2019 0300   Sepsis Labs Invalid input(s): PROCALCITONIN,  WBC,  LACTICIDVEN Microbiology Recent Results (from the past 240 hour(s))  SARS Coronavirus 2 by RT PCR (hospital order, performed in [facility] hospital lab) Nasopharyngeal Nasopharyngeal Swab     Status: None   Collection Time: 12/04/19  8:09 PM   Specimen: Nasopharyngeal Swab  Result Value Ref Range Status   SARS Coronavirus 2 NEGATIVE NEGATIVE Final    Comment: (NOTE) SARS-CoV-2 target nucleic acids are NOT DETECTED.  The SARS-CoV-2 RNA is generally detectable in upper and lower respiratory specimens during the acute phase of infection.  The  lowest concentration of SARS-CoV-2 viral copies this assay can detect is 250 copies / mL. A negative result does not preclude SARS-CoV-2 infection and should not be used as the sole basis for treatment or other patient management decisions.  A negative result may occur with improper specimen collection / handling, submission of specimen other than nasopharyngeal swab, presence of viral mutation(s) within the areas targeted by this assay, and inadequate number of viral copies (<250 copies / mL). A negative result must be combined with clinical observations, patient history, and epidemiological information.  Fact Sheet for Patients:   BoilerBrush.com.cyhttps://www.fda.gov/media/136312/download  Fact Sheet for Healthcare Providers: https://pope.com/https://www.fda.gov/media/136313/download  This test is not yet approved or  cleared by the [place] FDA and has been authorized for detection and/or diagnosis of SARS-CoV-2 by FDA under an Emergency Use Authorization (EUA).  This EUA will remain in effect (meaning this test can be used) for the duration of the COVID-19 declaration under Section 564(b)(1) of the Act, 21 U.S.C. section 360bbb-3(b)(1), unless the authorization is terminated or revoked sooner.  Performed at [facility], [address] [place], [place] [address]      Time coordinating discharge: Over 30 minutes  SIGNED:   Cipriano BunkerPARDEEP Proctor Carriker, MD  [facility] 12/08/2019, 11:10 AM Pager   If 7PM-7AM, please contact night-coverage [email or web address]

## 2019-12-08 NOTE — Progress Notes (Signed)
[facility] Gastroenterology Progress Note  [name] 39 y.o. Sep 05, 1980  CC: Variceal bleed   Subjective: Patient seen and examined at bedside.  Feeling much better today.  Denies any further bleeding episodes.  Tolerating soft diet.  ROS : Afebrile.  Negative for chest pain or shortness of breath.   Objective: Vital signs in last 24 hours: Vitals:   12/07/19 2355 12/08/19 0628  BP: 120/85 122/86  Pulse: 71 65  Resp: 18 18  Temp: 98.5 F (36.9 C) 97.9 F (36.6 C)  SpO2: 97% 98%    Physical Exam:  General:  Alert, cooperative, no distress, appears stated age  Head:  Normocephalic, without obvious abnormality, atraumatic  Eyes:  , EOM's intact,   Lungs:   Clear to auscultation bilaterally, respirations unlabored  Heart:  Regular rate and rhythm, S1, S2 normal  Abdomen:   Soft, non-tender, nondistended, bowel sounds present  Extremities: Extremities normal, atraumatic, no  edema  Pulses: 2+ and symmetric    Lab Results: Recent Labs    12/07/19 0749 12/08/19 0551  NA 142 141  K 3.3* 4.1  CL 105 104  CO2 29 31  GLUCOSE 158* 102*  BUN <5* <5*  CREATININE 0.86 0.96  CALCIUM 8.2* 8.2*  MG 1.5*  --   PHOS 3.1  --    Recent Labs    12/06/19 0408 12/07/19 0749  AST 36 46*  ALT 15 15  ALKPHOS 37* 36*  BILITOT 1.2 1.1  PROT 5.0* 5.4*  ALBUMIN 2.4* 2.5*   Recent Labs    12/07/19 0749 12/08/19 0551  WBC 6.3 6.4  HGB 9.8* 10.0*  HCT 30.1* 31.4*  MCV 95.0 95.2  PLT 87* 87*   Recent Labs    12/06/19 0408  LABPROT 18.7*  INR 1.6*      Assessment/Plan: Assessment ----------------- -Actively bleeding esophageal varices treated with band ligation.  Bleeding resolved.  Hemoglobin stable -Portal hypertensive gastropathy. -EtOH cirrhosis. With ongoing alcohol use.  Recommendations --------------------------- -Advance diet to 2 g sodium/heart healthy -Change IV Protonix to p.o. 40 mg once a day. -Continue nadolol -Okay to discharge from GI  standpoint this afternoon. -Recommend repeat EGD in 2 to 4 weeks for further band ligation.  If continues to have large esophageal varices, he may benefit from TIPS. -Absolute alcohol abstinence again discussed. -GI will sign off.  Follow-up in GI clinic in 2 to 4 weeks after discharge.  Call us back if needed   Kathi Der MD, FACP 12/08/2019, 8:47 AM  Contact #  [phone]

## 2019-12-08 NOTE — TOC Initial Note (Signed)
Transition of Care [facility]) - Initial/Assessment Note    Patient Details  Name: [name]: [id number] Date of Birth: 1980/08/30  [facility]) CM/SW Contact:    Kermit Balo, RN Phone Number: 12/08/2019, 11:39 AM  Clinical Narrative:                 Pt lives at home with his parents. He denies any transportation issues but does state he has trouble affording his medications at times. He has a PCP but paying out of pocket for appts. CM asked about the [facility] and he was interested. CM obtained an appt and placed it on the AVS. Pt can then use [facility] for medication assistance. Today medication sent to his regular pharmacy and he states he can afford the medication. CM sent Good RX card to the unit for the patient to use at his pharmacy. Pt has transport home.    Barriers to Discharge: Inadequate or no insurance, Barriers Unresolved (comment)   Patient Goals and CMS Choice        Expected Discharge Plan and Services           Expected Discharge Date: 12/08/19                                    Prior Living Arrangements/Services                       Activities of Daily Living      Permission Sought/Granted                  Emotional Assessment              Admission diagnosis:  Upper GI bleeding [K92.2] Acute GI bleeding [K92.2] Upper GI bleed [K92.2] Esophageal varices with bleeding (HCC) [I85.01] Patient Active Problem List   Diagnosis Date Noted  . GI bleed 10/09/2019  . Hematemesis 10/09/2019  . Leukocytosis 10/09/2019  . Elevated liver enzymes 10/09/2019  . Elevated lactic acid level 09/02/2019  . Bleeding esophageal varices (HCC) 09/02/2019  . Shock (HCC) 08/30/2019  . Electrolyte disturbance 07/16/2019  . Lactic acidosis 07/16/2019  . Hyperammonemia (HCC) 07/16/2019  . Hyperbilirubinemia 07/16/2019  . Acute encephalopathy   . Acute upper GI bleed   . Ascites   . Distended abdomen   . Hemorrhagic  shock (HCC)   . Encounter for palliative care   . Goals of care, counseling/discussion   . Encephalopathy acute 08/27/2015  . Alcoholic cirrhosis (HCC)   . Acute hypoxemic respiratory failure (HCC)   . SBP (spontaneous bacterial peritonitis) (HCC)   . Hypocalcemia 08/21/2015  . Hypomagnesemia 08/21/2015  . Acute blood loss anemia 08/20/2015  . Alcohol abuse 08/20/2015  . Elevated INR 08/20/2015  . Hypoalbuminemia 08/20/2015  . Hypokalemia 08/20/2015   PCP:  Farris Has, MD Pharmacy:   [facility] [phone] - [place], [place] - [address] AT [facility] [address] [place]  [address] Phone: [phone] Fax: [phone]     Social Determinants of Health (SDOH) Interventions    Readmission Risk Interventions No flowsheet data found.

## 2019-12-08 NOTE — Discharge Instructions (Signed)
Advised to follow-up with primary care physician in 1 week. Advised to follow-up with gastroenterology in 2 to 4 weeks. Recommend repeat EGD in 2 to 4 weeks for further band ligation.  Advised to take Protonix 40 mg p.o. daily Advised to abstain from alcohol.

## 2019-12-09 ENCOUNTER — Other Ambulatory Visit: Payer: Self-pay | Admitting: Gastroenterology

## 2019-12-09 ENCOUNTER — Other Ambulatory Visit (HOSPITAL_COMMUNITY): Payer: Self-pay

## 2019-12-10 NOTE — Anesthesia Postprocedure Evaluation (Signed)
Anesthesia Post Note  Patient: [name] [facility]  Procedure(s) Performed: ESOPHAGOGASTRODUODENOSCOPY (EGD) (N/A ) HOT HEMOSTASIS (ARGON PLASMA COAGULATION/BICAP) (N/A ) GASTRIC VARICES BANDING     Patient location during evaluation: Endoscopy Anesthesia Type: MAC Level of consciousness: awake and alert Pain management: pain level controlled Vital Signs Assessment: post-procedure vital signs reviewed and stable Respiratory status: spontaneous breathing, nonlabored ventilation, respiratory function stable and patient connected to nasal cannula oxygen Cardiovascular status: stable and blood pressure returned to baseline Postop Assessment: no apparent nausea or vomiting Anesthetic complications: no   No complications documented.  Last Vitals:  Vitals:   12/07/19 2355 12/08/19 0628  BP: 120/85 122/86  Pulse: 71 65  Resp: 18 18  Temp: 36.9 C 36.6 C  SpO2: 97% 98%    Last Pain:  Vitals:   12/08/19 0746  TempSrc:   PainSc: 0-No pain                 Marsha Gundlach

## 2019-12-12 ENCOUNTER — Other Ambulatory Visit: Payer: Self-pay | Admitting: Gastroenterology

## 2019-12-13 ENCOUNTER — Other Ambulatory Visit (HOSPITAL_COMMUNITY): Payer: Self-pay | Admitting: Interventional Radiology

## 2019-12-13 ENCOUNTER — Encounter: Payer: Self-pay | Admitting: *Deleted

## 2019-12-13 ENCOUNTER — Other Ambulatory Visit: Payer: Self-pay

## 2019-12-13 ENCOUNTER — Ambulatory Visit
Admission: RE | Admit: 2019-12-13 | Discharge: 2019-12-13 | Disposition: A | Discharge status: Home / Self Care | Payer: Self-pay | Source: Ambulatory Visit | Attending: Radiology | Admitting: Radiology

## 2019-12-13 DIAGNOSIS — I8501 Esophageal varices with bleeding: Secondary | ICD-10-CM

## 2019-12-13 DIAGNOSIS — K7030 Alcoholic cirrhosis of liver without ascites: Secondary | ICD-10-CM

## 2019-12-13 DIAGNOSIS — K7031 Alcoholic cirrhosis of liver with ascites: Secondary | ICD-10-CM

## 2019-12-13 HISTORY — PX: IR RADIOLOGIST EVAL & MGMT: IMG5224

## 2019-12-13 NOTE — Consult Note (Signed)
Chief Complaint: Patient was consulted remotely today (TeleHealth) for  at the request of Turpin,Pamela.    Referring Physician(s): Brahmbhatt, Parag  History of Present Illness: [name] is a 39 y.o. male intermittently familiar to our service most recently during his admission for upper GI bleed.  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  He has also required intermittent paracentesis procedures. 12/04/2019 admitted with upper GI bleed, underwent endoscopic banding, managed medically, did not require transfusion. He has had conversations early on regarding liver transplant but is currently not aligned with a transplant center. No anticoagulant use.  Past Medical History:  Diagnosis Date   Cirrhosis of liver (HCC)    Esophageal varices with hemorrhage (HCC)    ETOH abuse    GERD (gastroesophageal reflux disease)     Past Surgical History:  Procedure Laterality Date   ESOPHAGEAL BANDING  08/30/2019   Procedure: ESOPHAGEAL BANDING;  Surgeon: Kerin Salen, MD;  Location: [facility] ENDOSCOPY;  Service: Gastroenterology;;   ESOPHAGEAL BANDING  10/10/2019   Procedure: ESOPHAGEAL BANDING;  Surgeon: Kerin Salen, MD;  Location: [facility] ENDOSCOPY;  Service: Gastroenterology;;   ESOPHAGOGASTRODUODENOSCOPY Left 08/21/2015   Procedure: ESOPHAGOGASTRODUODENOSCOPY (EGD);  Surgeon: Charlott Rakes, MD;  Location: [facility] ENDOSCOPY;  Service: Endoscopy;  Laterality: Left;   ESOPHAGOGASTRODUODENOSCOPY N/A 12/05/2019   Procedure: ESOPHAGOGASTRODUODENOSCOPY (EGD);  Surgeon: Kathi Der, MD;  Location: [facility] ENDOSCOPY;  Service: Gastroenterology;  Laterality: N/A;   ESOPHAGOGASTRODUODENOSCOPY (EGD) WITH PROPOFOL N/A 08/30/2019   Procedure: ESOPHAGOGASTRODUODENOSCOPY (EGD) WITH PROPOFOL;  Surgeon: Kerin Salen, MD;  Location: [facility] ENDOSCOPY;  Service: Gastroenterology;  Laterality: N/A;   ESOPHAGOGASTRODUODENOSCOPY (EGD)  WITH PROPOFOL N/A 10/10/2019   Procedure: ESOPHAGOGASTRODUODENOSCOPY (EGD) WITH PROPOFOL;  Surgeon: Kerin Salen, MD;  Location: [facility] ENDOSCOPY;  Service: Gastroenterology;  Laterality: N/A;   GASTRIC VARICES BANDING  12/05/2019   Procedure: GASTRIC VARICES BANDING;  Surgeon: Kathi Der, MD;  Location: [facility] ENDOSCOPY;  Service: Gastroenterology;;  placed 6 bands   HOT HEMOSTASIS N/A 12/05/2019   Procedure: HOT HEMOSTASIS (ARGON PLASMA COAGULATION/BICAP);  Surgeon: Kathi Der, MD;  Location[facility] ENDOSCOPY;  Service: Gastroenterology;  Laterality: N/A;    Allergies: Penicillins  Medications: Prior to Admission medications   Medication Sig Start Date End Date Taking? Authorizing Provider  Ferrous Sulfate (IRON) 325 (65 Fe) MG TABS Take 1 tablet (325 mg total) by mouth daily. 09/02/19   Regalado, Belkys A, MD  lactulose (CHRONULAC) 10 GM/15ML solution Take 45 mLs (30 g total) by mouth 2 (two) times daily. Patient taking differently: Take 15 g by mouth 2 (two) times daily.  09/02/19   Regalado, Belkys A, MD  magnesium oxide (MAG-OX) 400 (241.3 Mg) MG tablet Take 1 tablet (400 mg total) by mouth 2 (two) times daily. 09/02/19   Regalado, Belkys A, MD  Multiple Vitamin (MULTIVITAMIN WITH MINERALS) TABS tablet Take 1 tablet by mouth daily. 07/19/19   Johnson, Clanford L, MD  nadolol (CORGARD) 20 MG tablet Take 1 tablet (20 mg total) by mouth daily. 09/02/19   Regalado, Belkys A, MD  pantoprazole (PROTONIX) 40 MG tablet Take 1 tablet (40 mg total) by mouth daily. 12/09/19   Cipriano Bunker, MD  potassium chloride SA (KLOR-CON) 20 MEQ tablet Take 1 tablet (20 mEq total) by mouth daily. 09/02/19   Regalado, Belkys A, MD  sertraline (ZOLOFT) 100 MG tablet Take 1 tablet (100 mg total) by mouth daily. 09/02/19   Regalado, Prentiss Bells, MD  Family History  Problem Relation Age of Onset   Peptic Ulcer Paternal Aunt    Crohn's disease Maternal Uncle    Heart attack Maternal Uncle    Heart attack Maternal  Grandfather    Stroke Maternal Grandmother    Stroke Paternal Grandfather     Social History   Socioeconomic History   Marital status: Single    Spouse name: Not on file   Number of children: Not on file   Years of education: Not on file   Highest education level: Not on file  Occupational History   Not on file  Tobacco Use   Smoking status: Former Smoker    Packs/day: 1.00    Types: Cigarettes   Smokeless tobacco: Never Used  Building services engineerVaping Use   Vaping Use: Never used  Substance and Sexual Activity   Alcohol use: Yes    Comment: Alcoholism   Drug use: Yes    Types: Marijuana   Sexual activity: Not on file  Other Topics Concern   Not on file  Social History Narrative   Not on file   Social Determinants of Health   Financial Resource Strain:    Difficulty of Paying Living Expenses:   Food Insecurity:    Worried About Programme researcher, broadcasting/film/videounning Out of Food in the Last Year:    Baristaan Out of Food in the Last Year:   Transportation Needs:    Freight forwarderLack of Transportation (Medical):    Lack of Transportation (Non-Medical):   Physical Activity:    Days of Exercise per Week:    Minutes of Exercise per Session:   Stress:    Feeling of Stress :   Social Connections:    Frequency of Communication with Friends and Family:    Frequency of Social Gatherings with Friends and Family:    Attends Religious Services:    Active Member of Clubs or Organizations:    Attends Engineer, structuralClub or Organization Meetings:    Marital Status:     ECOG Status: 1 - Symptomatic but completely ambulatory  Review of Systems  Review of Systems: A 12 point ROS discussed and pertinent positives are indicated in the HPI above.  All other systems are negative.  Physical Exam No direct physical exam was performed (except for noted visual exam findings with Video Visits).     Vital Signs: There were no vitals taken for this visit.  Imaging: DG Chest Portable 1 View  Result Date: 12/04/2019 CLINICAL DATA:   Multiple episodes of emesis.  Hematemesis. EXAM: PORTABLE CHEST 1 VIEW COMPARISON:  None. FINDINGS: The heart size and mediastinal contours are within normal limits. Both lungs are clear. The visualized skeletal structures are unremarkable. IMPRESSION: No acute cardiopulmonary process. Electronically Signed   By: Genevive BiStewart  Edmunds M.D.   On: 12/04/2019 20:55   CT Angio Abd/Pel w/ and/or w/o  Result Date: 12/06/2019 CLINICAL DATA:  Alcoholic cirrhosis, recurrent ascites, acute upper GI bleed status post banding of esophageal varices. EXAM: CTA ABDOMEN AND PELVIS WITHOUT AND WITH CONTRAST TECHNIQUE: Multidetector CT imaging of the abdomen and pelvis was performed using the standard protocol during bolus administration of intravenous contrast. Multiplanar reconstructed images and MIPs were obtained and reviewed to evaluate the vascular anatomy. CONTRAST:  100mL OMNIPAQUE IOHEXOL 350 MG/ML SOLN COMPARISON:  09/18/2011 FINDINGS: VASCULAR Aorta: Normal caliber aorta without aneurysm, dissection, vasculitis or significant stenosis. Celiac: Patent without evidence of aneurysm, dissection, vasculitis or significant stenosis. SMA: Patent without evidence of aneurysm, dissection, vasculitis or significant stenosis. Renals: Both renal arteries are  patent without evidence of aneurysm, dissection, vasculitis, fibromuscular dysplasia or significant stenosis. IMA: Patent without evidence of aneurysm, dissection, vasculitis or significant stenosis. Inflow: Patent without evidence of aneurysm, dissection, vasculitis or significant stenosis. Proximal Outflow: Bilateral common femoral and visualized portions of the superficial and profunda femoral arteries are patent without evidence of aneurysm, dissection, vasculitis or significant stenosis. Veins: Hepatic veins patent. Right hepatic vein is diminutive and there are 2 accessory right hepatic veins draining segments 5 and 6. Portal vein patent. SMV and splenic vein patent.  Enlarged left gastric vein supplies esophageal varices. No gastric varices identified. No splenorenal shunt. Patent renal veins, IVC, iliac venous system. Review of the MIP images confirms the above findings. NON-VASCULAR Lower chest: Circumferential wall thickening in the distal esophagus. No pleural or pericardial effusion. Lung bases clear. Hepatobiliary: Nodular contour suggesting cirrhosis. No focal lesion. No biliary ductal dilatation. Gallbladder is physiologically distended. Pancreas: Unremarkable. No pancreatic ductal dilatation or surrounding inflammatory changes. Spleen: 18.3 cm length, no focal lesion Adrenals/Urinary Tract: Adrenal glands unremarkable. Kidneys enhance normally. No hydronephrosis. Urinary bladder physiologically distended. Stomach/Bowel: Stomach distended by ingested material. No gastric varices. Small bowel is nondilated. Appendix not discretely identified. No pericecal inflammatory/edematous change. The colon is nondilated, unremarkable. Lymphatic: No abdominal or pelvic adenopathy. Reproductive: Prostate is unremarkable. Other: Bilateral pelvic phleboliths. Small volume perihepatic ascites. No free air. Musculoskeletal: No acute or significant osseous findings. IMPRESSION: 1. Cirrhosis with splenomegaly, small volume ascites, and esophageal varices. Electronically Signed   By: Corlis Leak M.D.   On: 12/06/2019 14:36    Labs:  CBC: Recent Labs    12/05/19 0539 12/05/19 1200 12/06/19 0408 12/06/19 0408 12/06/19 1740 12/07/19 0032 12/07/19 0749 12/08/19 0551  WBC 11.0*  --  7.8  --   --   --  6.3 6.4  HGB 12.2*   < > 9.0*   < > 9.6* 9.2* 9.8* 10.0*  HCT 37.9*   < > 28.7*   < > 30.6* 28.4* 30.1* 31.4*  PLT 127*  --  96*  --   --   --  87* 87*   < > = values in this interval not displayed.    COAGS: Recent Labs    07/16/19 1641 07/16/19 1641 08/30/19 1445 08/30/19 1445 09/01/19 0805 10/09/19 0303 12/04/19 1959 12/06/19 0408  INR 1.5*   < > 1.9*   < > 1.9*  1.3* 1.4* 1.6*  APTT 31  --  31  --   --   --   --   --    < > = values in this interval not displayed.    BMP: Recent Labs    12/05/19 0539 12/06/19 0408 12/07/19 0749 12/08/19 0551  NA 144 143 142 141  K 3.6 3.4* 3.3* 4.1  CL 103 105 105 104  CO2 28 28 29 31   GLUCOSE 135* 90 158* 102*  BUN 12 10 <5* <5*  CALCIUM 8.0* 7.7* 8.2* 8.2*  CREATININE 0.88 0.90 0.86 0.96  GFRNONAA >60 >60 >60 >60  GFRAA >60 >60 >60 >60    LIVER FUNCTION TESTS: Recent Labs    12/04/19 1959 12/05/19 0539 12/06/19 0408 12/07/19 0749  BILITOT 1.6* 1.4* 1.2 1.1  AST 49* 35 36 46*  ALT 23 18 15 15   ALKPHOS 63 47 37* 36*  PROT 8.4* 6.4* 5.0* 5.4*  ALBUMIN 3.8 2.8* 2.4* 2.5*   MELD-Na score: 12 at 12/08/2019  5:51 AM MELD score: 12 at 12/08/2019  5:51 AM Calculated from: Serum Creatinine:  0.96 mg/dL (Using min of 1 mg/dL) at 05/16/4172  0:81 AM Serum Sodium: 141 mmol/L (Using max of 137 mmol/L) at 12/08/2019  5:51 AM Total Bilirubin: 1.1 mg/dL at 4/48/1856  3:14 AM INR(ratio): 1.6 at 12/06/2019  4:08 AM Age: 55 years  TUMOR MARKERS: No results for input(s): AFPTM, CEA, CA199, CHROMGRNA in the last 8760 hours.  Assessment and Plan:  My impression is that this patient has alcoholic  hepatic cirrhosis complicated by multiple recurrent upper GI hemorrhages the most recent earlier this month.  With a meld score of 12, he is a good clinical candidate for TIPS to minimize risk of recurrent life-threatening hemorrhages.  Based on his most recent CT abdomen, he he is anatomically approachable for uncomplicated TIPS creation.  We discussed the pathophysiology of hepatic cirrhosis, the fact that it is irreversible, and the only cure is transplant.  We discussed the pathophysiology of portal venous hypertension and esophageal varices.  We discussed the role of the TIPS to decompress the portal venous hypertension bypassing the liver parenchyma to minimize risk of GI bleeding and other stigmata of portal venous  hypertension.  We discussed the risk of hepatic encephalopathy and the likely need for lactulose therapy to prevent elevated ammonia levels.  He was familiar with most of these concepts and did seem to understand, asking appropriate questions which were answered.  We discussed in detail the TIPS technique, anticipated benefits, time course to symptom improvement, and need for longitudinal follow-up for both the TIPS and the cirrhosis.  He is motivated to proceed.  Accordingly, we can set up for elective TIPS creation under general anesthesia as an outpatient with extended recovery at [facility] at his convenience.  Thank you for this interesting consult.  I greatly enjoyed meeting GEOFF DACANAY and look forward to participating in their care.  A copy of this report was sent to the requesting provider on this date.  Electronically Signed: Nick Stults 12/13/2019, 2:47 PM   I spent a total of  30 Minutes   in remote  clinical consultation, greater than 50% of which was counseling/coordinating care for recurrent GI bleeds secondary to portal venous hypertension.  Visit type: Audio only (telephone). Audio (no video) only due to patient's lack of internet/smartphone capability. Alternative for in-person consultation at [facility], [address] [place], [place], [place]. This visit type was conducted due to national recommendations for restrictions regarding the COVID-19 Pandemic (e.g. social distancing).  This format is felt to be most appropriate for this patient at this time.  All issues noted in this document were discussed and addressed.

## 2019-12-20 ENCOUNTER — Other Ambulatory Visit: Payer: Self-pay

## 2019-12-20 DIAGNOSIS — Z20822 Contact with and (suspected) exposure to covid-19: Secondary | ICD-10-CM

## 2019-12-21 LAB — SARS-COV-2, NAA 2 DAY TAT

## 2019-12-21 LAB — NOVEL CORONAVIRUS, NAA: SARS-CoV-2, NAA: NOT DETECTED

## 2019-12-22 ENCOUNTER — Other Ambulatory Visit: Payer: Self-pay

## 2019-12-22 ENCOUNTER — Encounter (INDEPENDENT_AMBULATORY_CARE_PROVIDER_SITE_OTHER): Payer: Self-pay | Admitting: Primary Care

## 2019-12-22 ENCOUNTER — Ambulatory Visit (INDEPENDENT_AMBULATORY_CARE_PROVIDER_SITE_OTHER): Payer: Self-pay | Admitting: Primary Care

## 2019-12-22 VITALS — BP 104/62 | HR 86 | Temp 97.3°F | Ht 67.0 in | Wt 135.0 lb

## 2019-12-22 DIAGNOSIS — Z23 Encounter for immunization: Secondary | ICD-10-CM

## 2019-12-22 DIAGNOSIS — Z09 Encounter for follow-up examination after completed treatment for conditions other than malignant neoplasm: Secondary | ICD-10-CM

## 2019-12-22 DIAGNOSIS — K7030 Alcoholic cirrhosis of liver without ascites: Secondary | ICD-10-CM

## 2019-12-22 DIAGNOSIS — F329 Major depressive disorder, single episode, unspecified: Secondary | ICD-10-CM

## 2019-12-22 DIAGNOSIS — F32A Depression, unspecified: Secondary | ICD-10-CM

## 2019-12-22 DIAGNOSIS — Z7689 Persons encountering health services in other specified circumstances: Secondary | ICD-10-CM

## 2019-12-22 NOTE — Progress Notes (Signed)
Assessment and Plan: Detravion was seen today for hospitalization follow-up.  Diagnoses and all orders for this visit:  Encounter to establish care Gwinda Passe, NP-C will be your  (PCP) she is mastered prepared . Able to diagnosed and treatment also  answer health concern as well as continuing care of varied medical conditions, not limited by cause, organ system, or diagnosis.   Depression, unspecified depression type   Office Visit from 12/22/2019 in [facility]  PHQ-9 Total Score 10    ON [facility] discharge follow-up Retrieve from hospital discharge on 12/08/2019 Recommendations for Outpatient Follow-up:  1. Follow up with PCP in 1-2 weeks. Done  2. Please obtain BMP/CBC in one week. Done  3.   Advised to follow-up with gastroenterology in 2 to 4 weeks. 4.   Recommend repeat EGD in 2 to 4 weeks for further band ligation.  5.   Advised to take Protonix 40 mg p.o. daily 6.   Advised to abstain from alcohol. -Counseling completed  -     CBC with Differential -     Basic Metabolic Panel  Alcoholic cirrhosis of liver without ascites (HCC) Followed by Gastrology   Need for Tdap vaccination -     Tdap vaccine greater than or equal to 7yo IM-completed  HPI  Mr. Camerin Ladouceur 39 y.o.male presents for follow up from the hospital. Admit date to the hospital was 12/04/19, patient was discharged from the hospital on 12/08/19, patient was admitted for: Alcohol abuse, Alcoholic cirrhosis  AND Bleeding esophageal varices .  PATIENT PRESENTED WITH MEDICATION PREVIOUSLY NOT TAKING AS PRESCRIBED BUT HE IS NOW. nO REFILLS NEEDED AT THIS TIME.   Past Medical History:  Diagnosis Date  . Cirrhosis of liver (HCC)   . Esophageal varices with hemorrhage (HCC)   . ETOH abuse   . GERD (gastroesophageal reflux disease)      Allergies  Allergen Reactions  . Penicillins Hives      Current Outpatient Medications on File Prior to Visit  Medication Sig Dispense  Refill  . Ferrous Sulfate (IRON) 325 (65 Fe) MG TABS Take 1 tablet (325 mg total) by mouth daily. 20 tablet 0  . lactulose (CHRONULAC) 10 GM/15ML solution Take 45 mLs (30 g total) by mouth 2 (two) times daily. (Patient taking differently: Take 15 g by mouth 2 (two) times daily. ) 240 mL 0  . magnesium oxide (MAG-OX) 400 (241.3 Mg) MG tablet Take 1 tablet (400 mg total) by mouth 2 (two) times daily. 30 tablet 0  . Multiple Vitamin (MULTIVITAMIN WITH MINERALS) TABS tablet Take 1 tablet by mouth daily.    . nadolol (CORGARD) 20 MG tablet Take 1 tablet (20 mg total) by mouth daily. 30 tablet 0  . pantoprazole (PROTONIX) 40 MG tablet Take 1 tablet (40 mg total) by mouth daily. 30 tablet 1  . potassium chloride SA (KLOR-CON) 20 MEQ tablet Take 1 tablet (20 mEq total) by mouth daily. 5 tablet 0  . sertraline (ZOLOFT) 100 MG tablet Take 1 tablet (100 mg total) by mouth daily. 30 tablet 0   No current facility-administered medications on file prior to visit.    ROS: all negative except above.   Physical Exam: Filed Weights   12/22/19 0936  Weight: 135 lb (61.2 kg)   BP 104/62 (BP Location: Left Arm, Patient Position: Sitting, Cuff Size: Normal)   Pulse 86   Temp (!) 97.3 F (36.3 C) (Temporal)   Ht 5\' 7"  (1.702  m)   Wt 135 lb (61.2 kg)   SpO2 98%   BMI 21.14 kg/m  General Appearance:THIN FRAME , in no apparent distress. Eyes: PERRLA, EOMs, conjunctiva no swelling or erythema Sinuses: No Frontal/maxillary tenderness ENT/Mouth: Ext aud canals clear, TMs without erythema, bulging. No erythema, swelling, or exudate on post pharynx.  Tonsils not swollen or erythematous. Hearing normal.  Neck: Supple, thyroid normal.  Respiratory: Respiratory effort normal, BS equal bilaterally without rales, rhonchi, wheezing or stridor.  Cardio: RRR with no MRGs. Brisk peripheral pulses without edema.  Abdomen: Soft, + BS.  Non tender, no guarding, rebound, PALPABLE UMBILICAL hernias, masses. Lymphatics: Non  tender without lymphadenopathy.  Musculoskeletal: Full ROM, 5/5 strength, normal gait.  Skin: Warm, dry without rashes, lesions, ecchymosis.  Neuro: Cranial nerves intact. Normal muscle tone, no cerebellar symptoms. Sensation intact.  Psych: Awake and oriented X 3, normal affect, Insight and Judgment appropriate.     Grayce Sessions, NP 9:58 AM

## 2019-12-23 ENCOUNTER — Ambulatory Visit (HOSPITAL_COMMUNITY)
Admission: RE | Admit: 2019-12-23 | Discharge: 2019-12-23 | Disposition: A | Discharge status: Home / Self Care | Payer: Self-pay | Attending: Gastroenterology | Admitting: Gastroenterology

## 2019-12-23 ENCOUNTER — Ambulatory Visit (HOSPITAL_COMMUNITY): Payer: Self-pay | Admitting: Certified Registered Nurse Anesthetist

## 2019-12-23 ENCOUNTER — Other Ambulatory Visit (HOSPITAL_COMMUNITY): Payer: Self-pay | Admitting: Gastroenterology

## 2019-12-23 ENCOUNTER — Encounter (HOSPITAL_COMMUNITY)
Admission: RE | Disposition: A | Discharge status: Home / Self Care | Payer: Self-pay | Source: Home / Self Care | Attending: Gastroenterology

## 2019-12-23 ENCOUNTER — Other Ambulatory Visit: Payer: Self-pay

## 2019-12-23 DIAGNOSIS — K7030 Alcoholic cirrhosis of liver without ascites: Secondary | ICD-10-CM | POA: Insufficient documentation

## 2019-12-23 DIAGNOSIS — Z87891 Personal history of nicotine dependence: Secondary | ICD-10-CM | POA: Insufficient documentation

## 2019-12-23 DIAGNOSIS — K3189 Other diseases of stomach and duodenum: Secondary | ICD-10-CM | POA: Insufficient documentation

## 2019-12-23 DIAGNOSIS — Z79899 Other long term (current) drug therapy: Secondary | ICD-10-CM | POA: Insufficient documentation

## 2019-12-23 DIAGNOSIS — I8510 Secondary esophageal varices without bleeding: Secondary | ICD-10-CM | POA: Insufficient documentation

## 2019-12-23 DIAGNOSIS — K766 Portal hypertension: Secondary | ICD-10-CM | POA: Insufficient documentation

## 2019-12-23 DIAGNOSIS — K219 Gastro-esophageal reflux disease without esophagitis: Secondary | ICD-10-CM | POA: Insufficient documentation

## 2019-12-23 HISTORY — PX: ESOPHAGEAL BANDING: SHX5518

## 2019-12-23 HISTORY — PX: ESOPHAGOGASTRODUODENOSCOPY (EGD) WITH PROPOFOL: SHX5813

## 2019-12-23 LAB — CBC WITH DIFFERENTIAL/PLATELET
Basophils Absolute: 0.1 10*3/uL (ref 0.0–0.2)
Basos: 2 %
EOS (ABSOLUTE): 0.3 10*3/uL (ref 0.0–0.4)
Eos: 4 %
Hematocrit: 37.5 % (ref 37.5–51.0)
Hemoglobin: 12 g/dL — ABNORMAL LOW (ref 13.0–17.7)
Immature Grans (Abs): 0.1 10*3/uL (ref 0.0–0.1)
Immature Granulocytes: 1 %
Lymphocytes Absolute: 0.8 10*3/uL (ref 0.7–3.1)
Lymphs: 11 %
MCH: 30.1 pg (ref 26.6–33.0)
MCHC: 32 g/dL (ref 31.5–35.7)
MCV: 94 fL (ref 79–97)
Monocytes Absolute: 1 10*3/uL — ABNORMAL HIGH (ref 0.1–0.9)
Monocytes: 14 %
Neutrophils Absolute: 4.8 10*3/uL (ref 1.4–7.0)
Neutrophils: 68 %
Platelets: 179 10*3/uL (ref 150–450)
RBC: 3.99 x10E6/uL — ABNORMAL LOW (ref 4.14–5.80)
RDW: 14.3 % (ref 11.6–15.4)
WBC: 7 10*3/uL (ref 3.4–10.8)

## 2019-12-23 LAB — BASIC METABOLIC PANEL
BUN/Creatinine Ratio: 9 (ref 9–20)
BUN: 7 mg/dL (ref 6–24)
CO2: 25 mmol/L (ref 20–29)
Calcium: 9.4 mg/dL (ref 8.7–10.2)
Chloride: 104 mmol/L (ref 96–106)
Creatinine, Ser: 0.77 mg/dL (ref 0.76–1.27)
GFR calc Af Amer: 131 mL/min/{1.73_m2} (ref >59)
GFR calc non Af Amer: 114 mL/min/{1.73_m2} (ref >59)
Glucose: 80 mg/dL (ref 65–99)
Potassium: 4.3 mmol/L (ref 3.5–5.2)
Sodium: 142 mmol/L (ref 134–144)

## 2019-12-23 SURGERY — ESOPHAGOGASTRODUODENOSCOPY (EGD) WITH PROPOFOL
Anesthesia: Monitor Anesthesia Care

## 2019-12-23 MED ORDER — LIDOCAINE 2% (20 MG/ML) 5 ML SYRINGE
INTRAMUSCULAR | Status: DC | PRN
  Administered 2019-12-23: 80 mg via INTRAVENOUS

## 2019-12-23 MED ORDER — PROPOFOL 10 MG/ML IV BOLUS
INTRAVENOUS | Status: DC | PRN
  Administered 2019-12-23: 30 mg via INTRAVENOUS

## 2019-12-23 MED ORDER — PANTOPRAZOLE SODIUM 40 MG PO TBEC
40.0000 mg | DELAYED_RELEASE_TABLET | Freq: Every day | ORAL | 1 refills | Status: DC
Start: 2019-12-23 — End: 2020-01-12

## 2019-12-23 MED ORDER — PROPOFOL 500 MG/50ML IV EMUL
INTRAVENOUS | Status: AC
  Filled 2019-12-23: qty 100

## 2019-12-23 MED ORDER — PROPOFOL 500 MG/50ML IV EMUL
INTRAVENOUS | Status: DC | PRN
  Administered 2019-12-23: 200 ug/kg/min via INTRAVENOUS

## 2019-12-23 MED ORDER — LACTATED RINGERS IV SOLN
INTRAVENOUS | Status: AC | PRN
  Administered 2019-12-23: 1000 mL via INTRAVENOUS

## 2019-12-23 MED ORDER — PROPOFOL 10 MG/ML IV BOLUS
INTRAVENOUS | Status: AC
  Filled 2019-12-23: qty 20

## 2019-12-23 MED ORDER — SODIUM CHLORIDE 0.9 % IV SOLN: INTRAVENOUS | Status: DC

## 2019-12-23 MED FILL — PANTOPRAZOLE SOD DR 40 MG T: 40 | 30 days supply | Qty: 30 | Fill #0

## 2019-12-23 SURGICAL SUPPLY — 14 items

## 2019-12-23 NOTE — Anesthesia Procedure Notes (Addendum)
Procedure Name: MAC Date/Time: 12/23/2019 1:32 PM Performed by: West Pugh, CRNA Pre-anesthesia Checklist: Patient identified, Emergency Drugs available, Suction available, Patient being monitored and Timeout performed Patient Re-evaluated:Patient Re-evaluated prior to induction Oxygen Delivery Method: Simple face mask Preoxygenation: Pre-oxygenation with 100% oxygen Induction Type: IV induction Placement Confirmation: positive ETCO2 Dental Injury: Teeth and Oropharynx as per pre-operative assessment

## 2019-12-23 NOTE — Anesthesia Preprocedure Evaluation (Signed)
Anesthesia Evaluation  Patient identified by MRN, date of birth, ID band Patient awake    Reviewed: Allergy & Precautions, NPO status , Patient's Chart, lab work & pertinent test results  History of Anesthesia Complications Negative for: history of anesthetic complications  Airway Mallampati: II  TM Distance: >3 FB Neck ROM: Full    Dental  (+) Poor Dentition, Dental Advisory Given   Pulmonary neg shortness of breath, neg COPD, neg recent URI, former smoker,    Pulmonary exam normal breath sounds clear to auscultation       Cardiovascular Normal cardiovascular exam Rhythm:Regular     Neuro/Psych PSYCHIATRIC DISORDERS negative neurological ROS     GI/Hepatic GERD  Medicated,(+) Cirrhosis   Esophageal Varices    , ? GI bleed   Endo/Other  negative endocrine ROS  Renal/GU negative Renal ROS  negative genitourinary   Musculoskeletal negative musculoskeletal ROS (+)   Abdominal Normal abdominal exam  (+)   Peds  Hematology  (+) Blood dyscrasia, anemia ,   Anesthesia Other Findings   Reproductive/Obstetrics                             Anesthesia Physical  Anesthesia Plan  ASA: III  Anesthesia Plan: MAC   Post-op Pain Management:    Induction:   PONV Risk Score and Plan: 1 and Treatment may vary due to age or medical condition and Propofol infusion  Airway Management Planned: Natural Airway and Mask  Additional Equipment: None  Intra-op Plan:   Post-operative Plan:   Informed Consent: I have reviewed the patients History and Physical, chart, labs and discussed the procedure including the risks, benefits and alternatives for the proposed anesthesia with the patient or authorized representative who has indicated his/her understanding and acceptance.     Dental advisory given  Plan Discussed with: CRNA  Anesthesia Plan Comments:         Anesthesia Quick Evaluation

## 2019-12-23 NOTE — H&P (Signed)
Primary Care Physician:  Grayce Sessions, NP Primary Gastroenterologist:  Dr. Levora Angel  Reason for visit : Esophageal varices  HPI: [name] is a 39 y.o. 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  Underwent EGD on December 05, 2019 and was found to have active bleeding esophageal varices and band ligation was performed.  He did not require TIPS.  Was subsequently discharged home on beta-blocker.  Patient denies any further bleeding episodes since discharge. He has stopped drinking alcohol. Denies any other GI symptoms.   Past Medical History:  Diagnosis Date  . Cirrhosis of liver (HCC)   . Esophageal varices with hemorrhage (HCC)   . ETOH abuse   . GERD (gastroesophageal reflux disease)     Past Surgical History:  Procedure Laterality Date  . ESOPHAGEAL BANDING  08/30/2019   Procedure: ESOPHAGEAL BANDING;  Surgeon: Kerin Salen, MD;  Location: [facility] ENDOSCOPY;  Service: Gastroenterology;;  . ESOPHAGEAL BANDING  10/10/2019   Procedure: ESOPHAGEAL BANDING;  Surgeon: Kerin Salen, MD;  Location: [facility] ENDOSCOPY;  Service: Gastroenterology;;  . ESOPHAGOGASTRODUODENOSCOPY Left 08/21/2015   Procedure: ESOPHAGOGASTRODUODENOSCOPY (EGD);  Surgeon: Charlott Rakes, MD;  Location: [facility] ENDOSCOPY;  Service: Endoscopy;  Laterality: Left;  . ESOPHAGOGASTRODUODENOSCOPY N/A 12/05/2019   Procedure: ESOPHAGOGASTRODUODENOSCOPY (EGD);  Surgeon: Kathi Der, MD;  Location: [facility] ENDOSCOPY;  Service: Gastroenterology;  Laterality: N/A;  . ESOPHAGOGASTRODUODENOSCOPY (EGD) WITH PROPOFOL N/A 08/30/2019   Procedure: ESOPHAGOGASTRODUODENOSCOPY (EGD) WITH PROPOFOL;  Surgeon: Kerin Salen, MD;  Location: [facility] ENDOSCOPY;  Service: Gastroenterology;  Laterality: N/A;  . ESOPHAGOGASTRODUODENOSCOPY (EGD) WITH PROPOFOL N/A 10/10/2019   Procedure: ESOPHAGOGASTRODUODENOSCOPY (EGD) WITH PROPOFOL;   Surgeon: Kerin Salen, MD;  Location: [facility] ENDOSCOPY;  Service: Gastroenterology;  Laterality: N/A;  . GASTRIC VARICES BANDING  12/05/2019   Procedure: GASTRIC VARICES BANDING;  Surgeon: Kathi Der, MD;  Location: [facility] ENDOSCOPY;  Service: Gastroenterology;;  placed 6 bands  . HOT HEMOSTASIS N/A 12/05/2019   Procedure: HOT HEMOSTASIS (ARGON PLASMA COAGULATION/BICAP);  Surgeon: Kathi Der, MD;  Location: [facility] ENDOSCOPY;  Service: Gastroenterology;  Laterality: N/A;  . IR RADIOLOGIST EVAL & MGMT  12/13/2019    Prior to Admission medications   Medication Sig Start Date End Date Taking? Authorizing Provider  Ferrous Sulfate (IRON) 325 (65 Fe) MG TABS Take 1 tablet (325 mg total) by mouth daily. 09/02/19   Regalado, Belkys A, MD  lactulose (CHRONULAC) 10 GM/15ML solution Take 45 mLs (30 g total) by mouth 2 (two) times daily. Patient taking differently: Take 15 g by mouth 2 (two) times daily.  09/02/19   Regalado, Belkys A, MD  magnesium oxide (MAG-OX) 400 (241.3 Mg) MG tablet Take 1 tablet (400 mg total) by mouth 2 (two) times daily. 09/02/19   Regalado, Belkys A, MD  Multiple Vitamin (MULTIVITAMIN WITH MINERALS) TABS tablet Take 1 tablet by mouth daily. 07/19/19   Johnson, Clanford L, MD  nadolol (CORGARD) 20 MG tablet Take 1 tablet (20 mg total) by mouth daily. 09/02/19   Regalado, Belkys A, MD  pantoprazole (PROTONIX) 40 MG tablet Take 1 tablet (40 mg total) by mouth daily. 12/09/19   Cipriano Bunker, MD  potassium chloride SA (KLOR-CON) 20 MEQ tablet Take 1 tablet (20 mEq total) by mouth daily. 09/02/19   Regalado, Belkys A, MD  sertraline (ZOLOFT) 100 MG tablet Take 1 tablet (100 mg total) by mouth daily. 09/02/19   Regalado, Prentiss Bells, MD  Scheduled Meds: Continuous Infusions: PRN Meds:.  Allergies as of 12/09/2019 - Review Complete 12/05/2019  Allergen Reaction Noted  . Penicillins Hives 05/26/2018    Family History  Problem Relation Age of Onset  . Peptic Ulcer Paternal Aunt   . Crohn's  disease Maternal Uncle   . Heart attack Maternal Uncle   . Heart attack Maternal Grandfather   . Stroke Maternal Grandmother   . Stroke Paternal Grandfather     Social History   Socioeconomic History  . Marital status: Single    Spouse name: Not on file  . Number of children: Not on file  . Years of education: Not on file  . Highest education level: Not on file  Occupational History  . Not on file  Tobacco Use  . Smoking status: Former Smoker    Packs/day: 1.00    Types: Cigarettes  . Smokeless tobacco: Never Used  Vaping Use  . Vaping Use: Never used  Substance and Sexual Activity  . Alcohol use: Not Currently    Comment: Alcoholism  . Drug use: Yes    Types: Marijuana  . Sexual activity: Not on file  Other Topics Concern  . Not on file  Social History Narrative  . Not on file   Social Determinants of Health   Financial Resource Strain:   . Difficulty of Paying Living Expenses: Not on file  Food Insecurity:   . Worried About Programme researcher, broadcasting/film/video in the Last Year: Not on file  . Ran Out of Food in the Last Year: Not on file  Transportation Needs:   . Lack of Transportation (Medical): Not on file  . Lack of Transportation (Non-Medical): Not on file  Physical Activity:   . Days of Exercise per Week: Not on file  . Minutes of Exercise per Session: Not on file  Stress:   . Feeling of Stress : Not on file  Social Connections:   . Frequency of Communication with Friends and Family: Not on file  . Frequency of Social Gatherings with Friends and Family: Not on file  . Attends Religious Services: Not on file  . Active Member of Clubs or Organizations: Not on file  . Attends Banker Meetings: Not on file  . Marital Status: Not on file  Intimate Partner Violence:   . Fear of Current or Ex-Partner: Not on file  . Emotionally Abused: Not on file  . Physically Abused: Not on file  . Sexually Abused: Not on file      Physical Exam: Vital signs: There  were no vitals filed for this visit.   General:   Alert,  Well-developed, well-nourished, pleasant and cooperative in NAD Lungs:  Clear throughout to auscultation.   No wheezes, crackles, or rhonchi. No acute distress. Heart:  Regular rate and rhythm; no murmurs, clicks, rubs,  or gallops. Abdomen: Soft, nontender, nondistended, bowel sounds present Rectal:  Deferred  GI:  Lab Results: Recent Labs    12/22/19 1049  WBC 7.0  HGB 12.0*  HCT 37.5  PLT 179   BMET Recent Labs    12/22/19 1049  NA 142  K 4.3  CL 104  CO2 25  GLUCOSE 80  BUN 7  CREATININE 0.77  CALCIUM 9.4   LFT No results for input(s): PROT, ALBUMIN, AST, ALT, ALKPHOS, BILITOT, BILIDIR, IBILI in the last 72 hours. PT/INR No results for input(s): LABPROT, INR in the last 72 hours.   Studies/Results: No results found.  Impression/Plan: -Esophageal varices -Alcoholic  cirrhosis  Recommendations ------------------------- -Proceed with EGD with band ligation.  Risks (bleeding, infection, bowel perforation that could require surgery, sedation-related changes in cardiopulmonary systems), benefits (identification and possible treatment of source of symptoms, exclusion of certain causes of symptoms), and alternatives (watchful waiting, radiographic imaging studies, empiric medical treatment)  were explained to patient/family in detail and patient wishes to proceed.    LOS: 0 days   Kathi Der  MD, FACP 12/23/2019, 12:11 PM  Contact #  [phone]

## 2019-12-23 NOTE — Discharge Instructions (Signed)
YOU HAD AN ENDOSCOPIC PROCEDURE TODAY: Refer to the procedure report and other information in the discharge instructions given to you for any specific questions about what was found during the examination. If this information does not answer your questions, please call [place] office at [phone] to clarify.   YOU SHOULD EXPECT: Some feelings of bloating in the abdomen. Passage of more gas than usual. Walking can help get rid of the air that was put into your GI tract during the procedure and reduce the bloating. If you had a lower endoscopy (such as a colonoscopy or flexible sigmoidoscopy) you may notice spotting of blood in your stool or on the toilet paper. Some abdominal soreness may be present for a day or two, also.  DIET: Your first meal following the procedure should be a light meal and then it is ok to progress to your normal diet. A half-sandwich or bowl of soup is an example of a good first meal. Heavy or fried foods are harder to digest and may make you feel nauseous or bloated. Drink plenty of fluids but you should avoid alcoholic beverages for 24 hours. If you had a esophageal dilation, please see attached instructions for diet.    ACTIVITY: Your care partner should take you home directly after the procedure. You should plan to take it easy, moving slowly for the rest of the day. You can resume normal activity the day after the procedure however YOU SHOULD NOT DRIVE, use power tools, machinery or perform tasks that involve climbing or major physical exertion for 24 hours (because of the sedation medicines used during the test).   SYMPTOMS TO REPORT IMMEDIATELY: A gastroenterologist can be reached at any hour. Please call [phone]  for any of the following symptoms:   Following upper endoscopy (EGD, EUS, ERCP, esophageal dilation) Vomiting of blood or coffee ground material  New, significant abdominal pain  New, significant chest pain or pain under the shoulder blades  Painful or  persistently difficult swallowing  New shortness of breath  Black, tarry-looking or red, bloody stools  FOLLOW UP:  If any biopsies were taken you will be contacted by phone or by letter within the next 1-3 weeks. Call [phone]  if you have not heard about the biopsies in 3 weeks.  Please also call with any specific questions about appointments or follow up tests.  

## 2019-12-23 NOTE — Op Note (Signed)
[facility] Patient Name: [name] Procedure Date: 12/23/2019 MRN: [id number] Attending MD: Kathi Der , MD Date of Birth: Aug 18, 1980 CSN: [id number] Age: 39 Admit Type: Outpatient Procedure:                Upper GI endoscopy Indications:              For therapy of esophageal varices Providers:                Kathi Der, MD, Rogue Jury, RN, Sunday Corn                            [place], Technician Referring MD:              Medicines:                Sedation Administered by an Anesthesia Professional Complications:            No immediate complications. Estimated Blood Loss:     Estimated blood loss was minimal. Procedure:                Pre-Anesthesia Assessment:                           - Prior to the procedure, a History and Physical                            was performed, and patient medications and                            allergies were reviewed. The patient's tolerance of                            previous anesthesia was also reviewed. The risks                            and benefits of the procedure and the sedation                            options and risks were discussed with the patient.                            All questions were answered, and informed consent                            was obtained. Prior Anticoagulants: The patient has                            taken no previous anticoagulant or antiplatelet                            agents. ASA Grade Assessment: III - A patient with                            severe systemic disease. After reviewing the risks  and benefits, the patient was deemed in                            satisfactory condition to undergo the procedure.                           After obtaining informed consent, the endoscope was                            passed under direct vision. Throughout the                            procedure, the patient's blood pressure, pulse,  and                            oxygen saturations were monitored continuously. The                            GIF-H190 ([id number] Olympus gastroscope was                            introduced through the mouth, and advanced to the                            second part of duodenum. The upper GI endoscopy was                            accomplished without difficulty. The patient                            tolerated the procedure well. Scope In: Scope Out: Findings:      Four columns of large (> 5 mm) varices were found in the distal       esophagus,. No red wale signs were present. Stigmata of prior treatment       with ulceration were evident. Five bands were successfully placed with       complete eradication, resulting in deflation of varices. There was some       oozing of blood with band ligation bur no bleeding at the end of the       procedure.      Mild portal hypertensive gastropathy was found in the gastric body.      The cardia and gastric fundus were normal on retroflexion.      The duodenal bulb, first portion of the duodenum and second portion of       the duodenum were normal. Impression:               - Large (> 5 mm) esophageal varices. Completely                            eradicated. Banded.                           - Portal hypertensive gastropathy.                           -  Normal duodenal bulb, first portion of the                            duodenum and second portion of the duodenum.                           - No specimens collected. Moderate Sedation:      Moderate (conscious) sedation was personally administered by an       anesthesia professional. The following parameters were monitored: oxygen       saturation, heart rate, blood pressure, and response to care. Recommendation:           - Patient has a contact number available for                            emergencies. The signs and symptoms of potential                            delayed complications  were discussed with the                            patient. Return to normal activities tomorrow.                            Written discharge instructions were provided to the                            patient.                           - Resume previous diet.                           - Continue present medications.                           - Repeat upper endoscopy in 4 weeks for retreatment.                           - Return to GI office as previously scheduled. Procedure Code(s):        --- Professional ---                           [phone], Esophagogastroduodenoscopy, flexible,                            transoral; with band ligation of esophageal/gastric                            varices Diagnosis Code(s):        --- Professional ---                           I85.00, Esophageal varices without bleeding                           K76.6, Portal hypertension  K31.89, Other diseases of stomach and duodenum CPT copyright 2019 American Medical Association. All rights reserved. The codes documented in this report are preliminary and upon coder review may  be revised to meet current compliance requirements. Kathi Der, MD Kathi Der, MD 12/23/2019 1:58:25 PM Number of Addenda: 0

## 2019-12-23 NOTE — Transfer of Care (Signed)
Immediate Anesthesia Transfer of Care Note  Patient: [name]  Procedure(s) Performed: ESOPHAGOGASTRODUODENOSCOPY (EGD) WITH PROPOFOL (N/A ) ESOPHAGEAL BANDING (N/A )  Patient Location: PACU and Endoscopy Unit  Anesthesia Type:MAC  Level of Consciousness: awake, alert , oriented and patient cooperative  Airway & Oxygen Therapy: Patient Spontanous Breathing and Patient connected to face mask oxygen  Post-op Assessment: Report given to RN and Post -op Vital signs reviewed and stable  Post vital signs: Reviewed and stable  Last Vitals:  Vitals Value Taken Time  BP    Temp    Pulse 68 12/23/19 1359  Resp 16 12/23/19 1359  SpO2 100 % 12/23/19 1359  Vitals shown include unvalidated device data.  Last Pain:  Vitals:   12/23/19 1238  TempSrc: Oral  PainSc: 0-No pain         Complications: No complications documented.

## 2019-12-26 ENCOUNTER — Encounter (INDEPENDENT_AMBULATORY_CARE_PROVIDER_SITE_OTHER): Payer: Self-pay | Admitting: Primary Care

## 2019-12-26 ENCOUNTER — Encounter (HOSPITAL_COMMUNITY): Payer: Self-pay | Admitting: Gastroenterology

## 2019-12-26 NOTE — Anesthesia Postprocedure Evaluation (Signed)
Anesthesia Post Note  Patient: [name] [facility]  Procedure(s) Performed: ESOPHAGOGASTRODUODENOSCOPY (EGD) WITH PROPOFOL (N/A ) ESOPHAGEAL BANDING (N/A )     Patient location during evaluation: Endoscopy Anesthesia Type: MAC Level of consciousness: awake Pain management: pain level controlled Vital Signs Assessment: post-procedure vital signs reviewed and stable Respiratory status: spontaneous breathing Cardiovascular status: stable Postop Assessment: no apparent nausea or vomiting Anesthetic complications: no   No complications documented.  Last Vitals:  Vitals:   12/23/19 1400 12/23/19 1410  BP: (!) 128/91 126/84  Pulse: 65 71  Resp: 13 17  Temp:    SpO2: 100% 99%    Last Pain:  Vitals:   12/24/19 1443  TempSrc:   PainSc: 0-No pain                 Huston Foley

## 2019-12-27 ENCOUNTER — Other Ambulatory Visit: Payer: Self-pay | Admitting: Gastroenterology

## 2019-12-28 ENCOUNTER — Other Ambulatory Visit: Payer: Self-pay | Admitting: Gastroenterology

## 2020-01-09 MED ORDER — LACTULOSE 10 GM/15ML PO SOLN
15.0000 g | Freq: Two times a day (BID) | ORAL | 2 refills | Status: DC
Start: 2020-01-09 — End: 2020-02-15

## 2020-01-09 MED FILL — LACTULOSE 10 GM/15 ML SOLN: 10 | 10 days supply | Qty: 473 | Fill #0

## 2020-01-10 ENCOUNTER — Other Ambulatory Visit: Payer: Self-pay

## 2020-01-10 DIAGNOSIS — Z20822 Contact with and (suspected) exposure to covid-19: Secondary | ICD-10-CM

## 2020-01-12 ENCOUNTER — Other Ambulatory Visit (HOSPITAL_COMMUNITY): Payer: Self-pay | Admitting: Gastroenterology

## 2020-01-12 ENCOUNTER — Ambulatory Visit (HOSPITAL_COMMUNITY)
Admission: RE | Admit: 2020-01-12 | Discharge: 2020-01-12 | Disposition: A | Discharge status: Home / Self Care | Payer: Self-pay | Attending: Gastroenterology | Admitting: Gastroenterology

## 2020-01-12 ENCOUNTER — Ambulatory Visit (HOSPITAL_COMMUNITY): Payer: Self-pay | Admitting: Anesthesiology

## 2020-01-12 ENCOUNTER — Other Ambulatory Visit: Payer: Self-pay

## 2020-01-12 ENCOUNTER — Encounter (HOSPITAL_COMMUNITY): Payer: Self-pay | Admitting: Gastroenterology

## 2020-01-12 ENCOUNTER — Encounter (HOSPITAL_COMMUNITY)
Admission: RE | Disposition: A | Discharge status: Home / Self Care | Payer: Self-pay | Source: Home / Self Care | Attending: Gastroenterology

## 2020-01-12 DIAGNOSIS — Z20822 Contact with and (suspected) exposure to covid-19: Secondary | ICD-10-CM | POA: Insufficient documentation

## 2020-01-12 DIAGNOSIS — K219 Gastro-esophageal reflux disease without esophagitis: Secondary | ICD-10-CM | POA: Insufficient documentation

## 2020-01-12 DIAGNOSIS — I8510 Secondary esophageal varices without bleeding: Secondary | ICD-10-CM | POA: Insufficient documentation

## 2020-01-12 DIAGNOSIS — Z79899 Other long term (current) drug therapy: Secondary | ICD-10-CM | POA: Insufficient documentation

## 2020-01-12 DIAGNOSIS — K7030 Alcoholic cirrhosis of liver without ascites: Secondary | ICD-10-CM | POA: Insufficient documentation

## 2020-01-12 DIAGNOSIS — K3189 Other diseases of stomach and duodenum: Secondary | ICD-10-CM | POA: Insufficient documentation

## 2020-01-12 DIAGNOSIS — Z87891 Personal history of nicotine dependence: Secondary | ICD-10-CM | POA: Insufficient documentation

## 2020-01-12 HISTORY — PX: ESOPHAGOGASTRODUODENOSCOPY (EGD) WITH PROPOFOL: SHX5813

## 2020-01-12 HISTORY — PX: ESOPHAGEAL BANDING: SHX5518

## 2020-01-12 LAB — NOVEL CORONAVIRUS, NAA: SARS-CoV-2, NAA: NOT DETECTED

## 2020-01-12 LAB — SARS-COV-2, NAA 2 DAY TAT

## 2020-01-12 LAB — SARS CORONAVIRUS 2 BY RT PCR (HOSPITAL ORDER, PERFORMED IN ~~LOC~~ HOSPITAL LAB): SARS Coronavirus 2: NEGATIVE

## 2020-01-12 SURGERY — ESOPHAGOGASTRODUODENOSCOPY (EGD) WITH PROPOFOL
Anesthesia: Monitor Anesthesia Care

## 2020-01-12 MED ORDER — NADOLOL 20 MG PO TABS: 20.0000 mg | ORAL_TABLET | Freq: Every day | ORAL | 1 refills | Status: DC

## 2020-01-12 MED ORDER — PROPOFOL 500 MG/50ML IV EMUL
INTRAVENOUS | Status: DC | PRN
  Administered 2020-01-12: 200 ug/kg/min via INTRAVENOUS

## 2020-01-12 MED ORDER — SODIUM CHLORIDE 0.9 % IV SOLN: INTRAVENOUS | Status: DC

## 2020-01-12 MED ORDER — PANTOPRAZOLE SODIUM 40 MG PO TBEC: 40.0000 mg | DELAYED_RELEASE_TABLET | Freq: Two times a day (BID) | ORAL | 2 refills | Status: DC

## 2020-01-12 MED ORDER — LIDOCAINE HCL (CARDIAC) PF 100 MG/5ML IV SOSY
PREFILLED_SYRINGE | INTRAVENOUS | Status: DC | PRN
  Administered 2020-01-12: 100 mg via INTRAVENOUS

## 2020-01-12 MED ORDER — PROPOFOL 10 MG/ML IV BOLUS
INTRAVENOUS | Status: DC | PRN
  Administered 2020-01-12 (×4): 50 mg via INTRAVENOUS

## 2020-01-12 MED ORDER — LACTATED RINGERS IV SOLN: INTRAVENOUS | Status: DC

## 2020-01-12 MED FILL — NADOLOL 20 MG TAB: 20 | 30 days supply | Qty: 30 | Fill #0

## 2020-01-12 MED FILL — PANTOPRAZOLE SOD DR 40 MG T: 40 | 30 days supply | Qty: 60 | Fill #0

## 2020-01-12 SURGICAL SUPPLY — 14 items

## 2020-01-12 NOTE — Transfer of Care (Signed)
Immediate Anesthesia Transfer of Care Note  Patient: [name]  Procedure(s) Performed: ESOPHAGOGASTRODUODENOSCOPY (EGD) WITH PROPOFOL (N/A ) ESOPHAGEAL BANDING (N/A )  Patient Location: endo  Anesthesia Type:MAC  Level of Consciousness: drowsy  Airway & Oxygen Therapy: Patient Spontanous Breathing and Patient connected to face mask oxygen  Post-op Assessment: Report given to RN and Post -op Vital signs reviewed and stable  Post vital signs: Reviewed and stable  Last Vitals:  Vitals Value Taken Time  BP    Temp    Pulse 91 01/12/20 1116  Resp 19 01/12/20 1116  SpO2 99 % 01/12/20 1116  Vitals shown include unvalidated device data.  Last Pain:  Vitals:   01/12/20 1029  TempSrc: Oral  PainSc: 0-No pain         Complications: No complications documented.

## 2020-01-12 NOTE — Anesthesia Preprocedure Evaluation (Addendum)
Anesthesia Evaluation  Patient identified by MRN, date of birth, ID band Patient awake    Reviewed: Allergy & Precautions, NPO status , Patient's Chart, lab work & pertinent test results  History of Anesthesia Complications Negative for: history of anesthetic complications  Airway Mallampati: II  TM Distance: >3 FB Neck ROM: Full    Dental  (+) Poor Dentition, Dental Advisory Given   Pulmonary neg pulmonary ROS, former smoker,    Pulmonary exam normal        Cardiovascular negative cardio ROS Normal cardiovascular exam  Study Conclusions 2017  - Left ventricle: The cavity size was normal. Systolic function was  normal. The estimated ejection fraction was in the range of 55%  to 60%. Wall motion was normal; there were no regional wall  motion abnormalities. Left ventricular diastolic function  parameters were normal.  - Aortic valve: Transvalvular velocity was within the normal range.  There was no stenosis. There was no regurgitation.  - Mitral valve: Transvalvular velocity was within the normal range.  There was no evidence for stenosis. There was no regurgitation.  - Right ventricle: The cavity size was normal. Wall thickness was  normal. Systolic function was normal.  - Tricuspid valve: There was no regurgitation   Neuro/Psych negative neurological ROS     GI/Hepatic GERD  Medicated,(+) Cirrhosis   Esophageal Varices  substance abuse  alcohol use,   Endo/Other  negative endocrine ROS  Renal/GU negative Renal ROS     Musculoskeletal negative musculoskeletal ROS (+)   Abdominal   Peds  Hematology negative hematology ROS (+)   Anesthesia Other Findings   Reproductive/Obstetrics                            Anesthesia Physical Anesthesia Plan  ASA: III  Anesthesia Plan: MAC   Post-op Pain Management:    Induction:   PONV Risk Score and Plan: 1 and Ondansetron  and Propofol infusion  Airway Management Planned: Natural Airway  Additional Equipment:   Intra-op Plan:   Post-operative Plan:   Informed Consent: I have reviewed the patients History and Physical, chart, labs and discussed the procedure including the risks, benefits and alternatives for the proposed anesthesia with the patient or authorized representative who has indicated his/her understanding and acceptance.     Dental advisory given  Plan Discussed with: Anesthesiologist and CRNA  Anesthesia Plan Comments:        Anesthesia Quick Evaluation

## 2020-01-12 NOTE — H&P (Signed)
Primary Care Physician:  Grayce Sessions, NP Primary Gastroenterologist:  Dr. Levora Angel  Reason for visit : Esophageal varices  HPI: [name] is a 39 y.o. 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  Underwent EGD on December 05, 2019 and was found to have active bleeding esophageal varices and band ligation was performed.  He did not require TIPS.  Was subsequently discharged home on beta-blocker.    Underwent repeat EGD on December 23, 2019 and he was again found to have large varices.  5 bands were placed.  Patient had some substernal discomfort after band ligation for few days which has resolved now.  Denies any abdominal pain, nausea or vomiting.  Denies any further bleeding episodes.  Denies reflux, dysphagia and odynophagia.     Past Medical History:  Diagnosis Date  . Cirrhosis of liver (HCC)   . Esophageal varices with hemorrhage (HCC)   . ETOH abuse   . GERD (gastroesophageal reflux disease)     Past Surgical History:  Procedure Laterality Date  . ESOPHAGEAL BANDING  08/30/2019   Procedure: ESOPHAGEAL BANDING;  Surgeon: Kerin Salen, MD;  Location: [facility] ENDOSCOPY;  Service: Gastroenterology;;  . ESOPHAGEAL BANDING  10/10/2019   Procedure: ESOPHAGEAL BANDING;  Surgeon: Kerin Salen, MD;  Location: [facility] ENDOSCOPY;  Service: Gastroenterology;;  . ESOPHAGEAL BANDING N/A 12/23/2019   Procedure: ESOPHAGEAL BANDING;  Surgeon: Kathi Der, MD;  Location: [facility] ENDOSCOPY;  Service: Gastroenterology;  Laterality: N/A;  . ESOPHAGOGASTRODUODENOSCOPY Left 08/21/2015   Procedure: ESOPHAGOGASTRODUODENOSCOPY (EGD);  Surgeon: Charlott Rakes, MD;  Location: [facility] ENDOSCOPY;  Service: Endoscopy;  Laterality: Left;  . ESOPHAGOGASTRODUODENOSCOPY N/A 12/05/2019   Procedure: ESOPHAGOGASTRODUODENOSCOPY (EGD);  Surgeon: Kathi Der, MD;  Location[facility] ENDOSCOPY;  Service:  Gastroenterology;  Laterality: N/A;  . ESOPHAGOGASTRODUODENOSCOPY (EGD) WITH PROPOFOL N/A 08/30/2019   Procedure: ESOPHAGOGASTRODUODENOSCOPY (EGD) WITH PROPOFOL;  Surgeon: Kerin Salen, MD;  Location: [facility] ENDOSCOPY;  Service: Gastroenterology;  Laterality: N/A;  . ESOPHAGOGASTRODUODENOSCOPY (EGD) WITH PROPOFOL N/A 10/10/2019   Procedure: ESOPHAGOGASTRODUODENOSCOPY (EGD) WITH PROPOFOL;  Surgeon: Kerin Salen, MD;  Location: [facility] ENDOSCOPY;  Service: Gastroenterology;  Laterality: N/A;  . ESOPHAGOGASTRODUODENOSCOPY (EGD) WITH PROPOFOL N/A 12/23/2019   Procedure: ESOPHAGOGASTRODUODENOSCOPY (EGD) WITH PROPOFOL;  Surgeon: Kathi Der, MD;  Location: [facility] ENDOSCOPY;  Service: Gastroenterology;  Laterality: N/A;  . GASTRIC VARICES BANDING  12/05/2019   Procedure: GASTRIC VARICES BANDING;  Surgeon: Kathi Der, MD;  Location: [facility] ENDOSCOPY;  Service: Gastroenterology;;  placed 6 bands  . HOT HEMOSTASIS N/A 12/05/2019   Procedure: HOT HEMOSTASIS (ARGON PLASMA COAGULATION/BICAP);  Surgeon: Kathi Der, MD;  Location: [facility] ENDOSCOPY;  Service: Gastroenterology;  Laterality: N/A;  . IR RADIOLOGIST EVAL & MGMT  12/13/2019    Prior to Admission medications   Medication Sig Start Date End Date Taking? Authorizing Provider  Ferrous Sulfate (IRON) 325 (65 Fe) MG TABS Take 1 tablet (325 mg total) by mouth daily. 09/02/19   Regalado, Belkys A, MD  lactulose (CHRONULAC) 10 GM/15ML solution Take 45 mLs (30 g total) by mouth 2 (two) times daily. Patient taking differently: Take 15 g by mouth 2 (two) times daily.  09/02/19   Regalado, Belkys A, MD  magnesium oxide (MAG-OX) 400 (241.3 Mg) MG tablet Take 1 tablet (400 mg total) by mouth 2 (two) times daily. 09/02/19   Regalado, Belkys A, MD  Multiple Vitamin (MULTIVITAMIN WITH MINERALS) TABS tablet Take 1 tablet by mouth  daily. 07/19/19   Johnson, Clanford L, MD  nadolol (CORGARD) 20 MG tablet Take 1 tablet (20 mg total) by mouth daily. 09/02/19   Regalado, Belkys A, MD   pantoprazole (PROTONIX) 40 MG tablet Take 1 tablet (40 mg total) by mouth daily. 12/09/19   Cipriano Bunker, MD  potassium chloride SA (KLOR-CON) 20 MEQ tablet Take 1 tablet (20 mEq total) by mouth daily. 09/02/19   Regalado, Belkys A, MD  sertraline (ZOLOFT) 100 MG tablet Take 1 tablet (100 mg total) by mouth daily. 09/02/19   Regalado, Belkys A, MD    Scheduled Meds: Continuous Infusions: PRN Meds:.  Allergies as of 12/28/2019 - Review Complete 12/23/2019  Allergen Reaction Noted  . Penicillins Hives 05/26/2018    Family History  Problem Relation Age of Onset  . Peptic Ulcer Paternal Aunt   . Crohn's disease Maternal Uncle   . Heart attack Maternal Uncle   . Heart attack Maternal Grandfather   . Stroke Maternal Grandmother   . Stroke Paternal Grandfather     Social History   Socioeconomic History  . Marital status: Single    Spouse name: Not on file  . Number of children: Not on file  . Years of education: Not on file  . Highest education level: Not on file  Occupational History  . Not on file  Tobacco Use  . Smoking status: Former Smoker    Packs/day: 1.00    Types: Cigarettes  . Smokeless tobacco: Never Used  Vaping Use  . Vaping Use: Never used  Substance and Sexual Activity  . Alcohol use: Not Currently    Comment: Alcoholism  . Drug use: Yes    Types: Marijuana  . Sexual activity: Not on file  Other Topics Concern  . Not on file  Social History Narrative  . Not on file   Social Determinants of Health   Financial Resource Strain:   . Difficulty of Paying Living Expenses: Not on file  Food Insecurity:   . Worried About Programme researcher, broadcasting/film/video in the Last Year: Not on file  . Ran Out of Food in the Last Year: Not on file  Transportation Needs:   . Lack of Transportation (Medical): Not on file  . Lack of Transportation (Non-Medical): Not on file  Physical Activity:   . Days of Exercise per Week: Not on file  . Minutes of Exercise per Session: Not on file   Stress:   . Feeling of Stress : Not on file  Social Connections:   . Frequency of Communication with Friends and Family: Not on file  . Frequency of Social Gatherings with Friends and Family: Not on file  . Attends Religious Services: Not on file  . Active Member of Clubs or Organizations: Not on file  . Attends Banker Meetings: Not on file  . Marital Status: Not on file  Intimate Partner Violence:   . Fear of Current or Ex-Partner: Not on file  . Emotionally Abused: Not on file  . Physically Abused: Not on file  . Sexually Abused: Not on file      Physical Exam: Vital signs: There were no vitals filed for this visit.   General:   Alert,  Well-developed, well-nourished, pleasant and cooperative in NAD Lungs:  Clear throughout to auscultation.   No wheezes, crackles, or rhonchi. No acute distress. Heart:  Regular rate and rhythm; no murmurs, clicks, rubs,  or gallops. Abdomen: Soft, nontender, nondistended, bowel sounds present Rectal:  Deferred  GI:  Lab Results: No results for input(s): WBC, HGB, HCT, PLT in the last 72 hours. BMET No results for input(s): NA, K, CL, CO2, GLUCOSE, BUN, CREATININE, CALCIUM in the last 72 hours. LFT No results for input(s): PROT, ALBUMIN, AST, ALT, ALKPHOS, BILITOT, BILIDIR, IBILI in the last 72 hours. PT/INR No results for input(s): LABPROT, INR in the last 72 hours.   Studies/Results: No results found.  Impression/Plan: -Esophageal varices -Alcoholic cirrhosis  Recommendations ------------------------- -Proceed with EGD with band ligation.  Risks (bleeding, infection, bowel perforation that could require surgery, sedation-related changes in cardiopulmonary systems), benefits (identification and possible treatment of source of symptoms, exclusion of certain causes of symptoms), and alternatives (watchful waiting, radiographic imaging studies, empiric medical treatment)  were explained to patient in detail and patient  wishes to proceed.    LOS: 0 days   Kathi Der  MD, FACP 01/12/2020, 10:22 AM  Contact #  [phone]

## 2020-01-12 NOTE — Anesthesia Postprocedure Evaluation (Signed)
Anesthesia Post Note  Patient: [name] [facility]  Procedure(s) Performed: ESOPHAGOGASTRODUODENOSCOPY (EGD) WITH PROPOFOL (N/A ) ESOPHAGEAL BANDING (N/A )     Patient location during evaluation: Endoscopy Anesthesia Type: MAC Level of consciousness: awake and alert Pain management: pain level controlled Vital Signs Assessment: post-procedure vital signs reviewed and stable Respiratory status: spontaneous breathing, nonlabored ventilation, respiratory function stable and patient connected to nasal cannula oxygen Cardiovascular status: blood pressure returned to baseline and stable Postop Assessment: no apparent nausea or vomiting Anesthetic complications: no   No complications documented.  Last Vitals:  Vitals:   01/12/20 1140 01/12/20 1143  BP: 128/84   Pulse: 79 79  Resp: 12 16  Temp:    SpO2: 98% 98%    Last Pain:  Vitals:   01/12/20 1143  TempSrc:   PainSc: 0-No pain                 Charlotte Brafford Zavon

## 2020-01-12 NOTE — Op Note (Signed)
[facility] Patient Name: [name] Procedure Date: 01/12/2020 MRN: [id number] Attending MD: Kathi Der , MD Date of Birth: July 31, 1980 CSN: [id number] Age: 39 Admit Type: Outpatient Procedure:                Upper GI endoscopy Indications:              For therapy of esophageal varices Providers:                Kathi Der, MD, Rogue Jury, RN, Sunday Corn                            Mbumina, Technician Referring MD:              Medicines:                Sedation Administered by an Anesthesia Professional Complications:            No immediate complications. Estimated Blood Loss:     Estimated blood loss was minimal. Procedure:                Pre-Anesthesia Assessment:                           - Prior to the procedure, a History and Physical                            was performed, and patient medications and                            allergies were reviewed. The patient's tolerance of                            previous anesthesia was also reviewed. The risks                            and benefits of the procedure and the sedation                            options and risks were discussed with the patient.                            All questions were answered, and informed consent                            was obtained. Prior Anticoagulants: The patient has                            taken no previous anticoagulant or antiplatelet                            agents. ASA Grade Assessment: III - A patient with                            severe systemic disease. After reviewing the risks  and benefits, the patient was deemed in                            satisfactory condition to undergo the procedure.                           After obtaining informed consent, the endoscope was                            passed under direct vision. Throughout the                            procedure, the patient's blood pressure, pulse,  and                            oxygen saturations were monitored continuously. The                            GIF-H190 ([id number]) Olympus gastroscope was                            introduced through the mouth, and advanced to the                            second part of duodenum. The upper GI endoscopy was                            technically difficult and complex due to band                            malfunction required replacement of banding device.                            The patient tolerated the procedure well. Scope In: Scope Out: Findings:      Four columns of large (> 5 mm) varices with no bleeding and no stigmata       of recent bleeding were found in the mid esophagus and in the distal       esophagus,. Red wale signs were present. Scarring from prior treatment       was visible. Four bands were successfully placed with incomplete       eradication of varices. There was no bleeding at the end of the maneuver.      A small amount of food (residue) was found in the cardia.      Moderate portal hypertensive gastropathy was found in the entire       examined stomach.      The duodenal bulb, first portion of the duodenum and second portion of       the duodenum were normal. Impression:               - Large (> 5 mm) esophageal varices with no                            bleeding and no stigmata of recent bleeding.  Incompletely eradicated. Banded.                           - A small amount of food (residue) in the stomach.                           - Portal hypertensive gastropathy.                           - Normal duodenal bulb, first portion of the                            duodenum and second portion of the duodenum.                           - No specimens collected. Moderate Sedation:      Moderate (conscious) sedation was personally administered by an       anesthesia professional. The following parameters were monitored: oxygen        saturation, heart rate, blood pressure, and response to care. Recommendation:           - Patient has a contact number available for                            emergencies. The signs and symptoms of potential                            delayed complications were discussed with the                            patient. Return to normal activities tomorrow.                            Written discharge instructions were provided to the                            patient.                           - Resume previous diet.                           - Continue present medications.                           - Repeat upper endoscopy in 2 months for                            retreatment.                           - Return to my office as previously scheduled. Procedure Code(s):        --- Professional ---                           [phone], Esophagogastroduodenoscopy, flexible,  transoral; with band ligation of esophageal/gastric                            varices Diagnosis Code(s):        --- Professional ---                           I85.00, Esophageal varices without bleeding                           K76.6, Portal hypertension                           K31.89, Other diseases of stomach and duodenum CPT copyright 2019 American Medical Association. All rights reserved. The codes documented in this report are preliminary and upon coder review may  be revised to meet current compliance requirements. Kathi DerParag Nylah Butkus, MD Kathi DerParag Abdulmalik Darco, MD 01/12/2020 11:15:50 AM Number of Addenda: 0

## 2020-01-12 NOTE — Discharge Instructions (Signed)

## 2020-01-13 ENCOUNTER — Encounter (HOSPITAL_COMMUNITY): Payer: Self-pay | Admitting: Gastroenterology

## 2020-01-17 ENCOUNTER — Other Ambulatory Visit: Payer: Self-pay | Admitting: Gastroenterology

## 2020-01-25 MED FILL — LACTULOSE 10 GM/15 ML SOLN: 10 | 10 days supply | Qty: 473 | Fill #1

## 2020-02-07 MED FILL — LACTULOSE 10 GM/15 ML SOLN: 10 | 10 days supply | Qty: 473 | Fill #2

## 2020-02-07 MED FILL — NADOLOL 20 MG TAB: 20 | 30 days supply | Qty: 30 | Fill #1

## 2020-02-15 ENCOUNTER — Other Ambulatory Visit (INDEPENDENT_AMBULATORY_CARE_PROVIDER_SITE_OTHER): Payer: Self-pay | Admitting: Primary Care

## 2020-02-15 MED FILL — LACTULOSE 10 GM/15 ML SOLN: 10 | 10 days supply | Qty: 473 | Fill #0

## 2020-02-15 MED FILL — PANTOPRAZOLE SOD DR 40 MG T: 40 | 30 days supply | Qty: 60 | Fill #1

## 2020-02-15 NOTE — Telephone Encounter (Signed)
Requested Prescriptions  Pending Prescriptions Disp Refills  . lactulose, encephalopathy, (CHRONULAC) 10 GM/15ML SOLN [Pharmacy Med Name: LACTULOSE 10 GM/15 ML SOLN 10 Solution] 473 mL 2    Sig: TAKE 22.5 MLS (15 G TOTAL) BY MOUTH 2 (TWO) TIMES DAILY.     Gastroenterology:  Laxatives Passed - 02/15/2020  9:38 AM      Passed - Valid encounter within last 12 months    Recent Outpatient Visits          1 month ago Encounter to establish care   [facility] Grayce Sessions, NP

## 2020-02-22 MED FILL — LACTULOSE 10 GM/15 ML SOLN: 10 | 10 days supply | Qty: 473 | Fill #1

## 2020-03-01 ENCOUNTER — Ambulatory Visit (HOSPITAL_COMMUNITY): Admission: RE | Admit: 2020-03-01 | Payer: Self-pay | Source: Home / Self Care | Admitting: Gastroenterology

## 2020-03-01 ENCOUNTER — Encounter (HOSPITAL_COMMUNITY): Admission: RE | Payer: Self-pay | Source: Home / Self Care

## 2020-03-01 SURGERY — ESOPHAGOGASTRODUODENOSCOPY (EGD) WITH PROPOFOL
Anesthesia: Monitor Anesthesia Care

## 2020-03-05 MED FILL — LACTULOSE 10 GM/15 ML SOLN: 10 | 10 days supply | Qty: 473 | Fill #2

## 2020-03-12 ENCOUNTER — Encounter (INDEPENDENT_AMBULATORY_CARE_PROVIDER_SITE_OTHER): Payer: Self-pay | Admitting: Primary Care

## 2020-03-15 ENCOUNTER — Encounter (INDEPENDENT_AMBULATORY_CARE_PROVIDER_SITE_OTHER): Payer: Self-pay | Admitting: Primary Care

## 2020-03-15 MED FILL — LACTULOSE 10 GM/15 ML SOLN: 10 | 30 days supply | Qty: 900 | Fill #0

## 2020-03-19 ENCOUNTER — Other Ambulatory Visit (INDEPENDENT_AMBULATORY_CARE_PROVIDER_SITE_OTHER): Payer: Self-pay | Admitting: Primary Care

## 2020-03-19 ENCOUNTER — Encounter (INDEPENDENT_AMBULATORY_CARE_PROVIDER_SITE_OTHER): Payer: Self-pay | Admitting: Primary Care

## 2020-03-19 MED ORDER — NADOLOL 20 MG PO TABS
20.0000 mg | ORAL_TABLET | Freq: Every day | ORAL | 1 refills | Status: DC
Start: 2020-03-19 — End: 2020-03-19

## 2020-03-20 MED FILL — NADOLOL 20 MG TAB: 20 | 30 days supply | Qty: 30 | Fill #0

## 2020-03-26 MED FILL — PANTOPRAZOLE SOD DR 40 MG T: 40 | 30 days supply | Qty: 60 | Fill #2

## 2020-04-02 MED FILL — LACTULOSE 10 GM/15 ML SOLN: 10 | 7 days supply | Qty: 473 | Fill #0

## 2020-04-11 MED FILL — LACTULOSE 10 GM/15 ML SOLN: 10 | 7 days supply | Qty: 473 | Fill #1

## 2020-04-18 MED FILL — LACTULOSE 10 GM/15 ML SOLN: 10 | 7 days supply | Qty: 473 | Fill #2

## 2020-04-18 MED FILL — NADOLOL 20 MG TAB: 20 | 30 days supply | Qty: 30 | Fill #1

## 2020-04-30 MED FILL — PANTOPRAZOLE SOD DR 40 MG T: 40 | 30 days supply | Qty: 30 | Fill #1

## 2020-05-01 ENCOUNTER — Encounter (INDEPENDENT_AMBULATORY_CARE_PROVIDER_SITE_OTHER): Payer: Self-pay | Admitting: Primary Care

## 2020-05-02 ENCOUNTER — Other Ambulatory Visit (INDEPENDENT_AMBULATORY_CARE_PROVIDER_SITE_OTHER): Payer: Self-pay | Admitting: Primary Care

## 2020-05-02 MED ORDER — LACTULOSE ENCEPHALOPATHY 10 GM/15ML PO SOLN
15.0000 g | Freq: Two times a day (BID) | ORAL | 0 refills | Status: DC
Start: 2020-05-02 — End: 2020-06-04

## 2020-05-02 MED FILL — LACTULOSE 10 GM/15 ML SOLN: 10 | 5 days supply | Qty: 237 | Fill #0

## 2020-05-15 NOTE — Progress Notes (Signed)
Subjective:    [name] - 40 y.o. male MRN [id number]  Date of birth: 28-Nov-1980  HPI  [name] is to establish care. Patient has a PMH significant for bleeding esophageal varices, acute hypoxemic respiratory failure, alcoholic cirrhosis, spontaneous bacterial peritonitis, hemorrhagic shock, hyperammonemia, and shock.  Current issues and/or concerns:  1. GI REFERRAL REQUEST: History of encephalopathy, cirrhosis, and esophageal bleeding. Last visit for EGD was 01/12/2020 endoscopy. Would like to discuss with them quarterly endoscopies.   2. ANXIETY AND DEPRESSION FOLLOW-UP: Was taking Zoloft 100 mg daily and abruptly quit 2 months ago because he ran out of medication. Also, felt that maybe he didn't need the medication at the time. States today he realizes he does need the medication and would like to restart. States anxiety and depression primarily related to his diagnoses. Reports increased agitation, brain fog, confusion, and bad memory. States this may or may not be related to encephalopathy. No longer drinking alcohol. Does use marijuana occasionally. Denies thoughts of self-harm, suicidal ideations, and homicidal ideations. Requesting referral to Psychiatry for further evaluation.   Depression screen [facility] 2/9 05/16/2020 12/22/2019  Decreased Interest 3 0  Down, Depressed, Hopeless 3 2  PHQ - 2 Score 6 2  Altered sleeping 0 0  Tired, decreased energy 0 1  Change in appetite 1 3  Feeling bad or failure about yourself  3 1  Trouble concentrating 3 1  Moving slowly or fidgety/restless 1 2  Suicidal thoughts 1 0  PHQ-9 Score 15 10    ROS per HPI   Health Maintenance:  Health Maintenance Due  Topic Date Due   INFLUENZA VACCINE  11/27/2019     Past Medical History: Patient Active Problem List   Diagnosis Date Noted   Alcoholic hepatitis 05/16/2020   Anemia 05/16/2020   Encephalopathy, portal systemic (HCC) 05/16/2020   Anxiety 05/16/2020   Gynecomastia  05/16/2020   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/16/2020   Esophageal varices with bleeding (HCC) 05/16/2020   Encephalopathy 05/16/2020   GI bleed 10/09/2019   Hematemesis 10/09/2019   Leukocytosis 10/09/2019   Elevated liver enzymes 10/09/2019   Elevated lactic acid level 09/02/2019   Bleeding esophageal varices (HCC) 09/02/2019   Shock (HCC) 08/30/2019   Electrolyte disturbance 07/16/2019   Lactic acidosis 07/16/2019   Hyperammonemia (HCC) 07/16/2019   Hyperbilirubinemia 07/16/2019   Alcoholic cirrhosis of liver without ascites (HCC) 07/24/2016   Acute encephalopathy    Acute upper GI bleed    Ascites    Distended abdomen    Hemorrhagic shock (HCC)    Encounter for palliative care    Goals of care, counseling/discussion    Encephalopathy acute 08/27/2015   Alcoholic cirrhosis (HCC)    Acute hypoxemic respiratory failure (HCC)    SBP (spontaneous bacterial peritonitis) (HCC)    Hypocalcemia 08/21/2015   Hypomagnesemia 08/21/2015   Acute blood loss anemia 08/20/2015   Alcohol abuse 08/20/2015   Elevated INR 08/20/2015   Hypoalbuminemia 08/20/2015   Hypokalemia 08/20/2015      Social History   reports that he has quit smoking. His smoking use included cigarettes. He smoked 1.00 pack per day. He has never used smokeless tobacco. He reports previous alcohol use of about 3.0 standard drinks of alcohol per week. He reports current drug use. Frequency: 7.00 times per week. Drug: Marijuana.   Family History  family history includes Crohn's disease in his maternal uncle; Heart attack in his maternal grandfather and maternal uncle;  Peptic Ulcer in his paternal aunt; Stroke in his maternal grandmother and paternal grandfather.   Medications: reviewed and updated   Objective:   Physical Exam BP 118/77 (BP Location: Left Arm, Patient Position: Sitting)    Pulse 65    Ht 5' 6.97" (1.701 m)    Wt 136 lb  12.8 oz (62.1 kg)    SpO2 98%    BMI 21.45 kg/m  Physical Exam HENT:     Head: Normocephalic.  Eyes:     Extraocular Movements: Extraocular movements intact.     Pupils: Pupils are equal, round, and reactive to light.  Cardiovascular:     Rate and Rhythm: Normal rate and regular rhythm.     Pulses: Normal pulses.     Heart sounds: Normal heart sounds.  Pulmonary:     Effort: Pulmonary effort is normal.     Breath sounds: Normal breath sounds.  Musculoskeletal:     Cervical back: Normal range of motion and neck supple.  Neurological:     General: No focal deficit present.     Mental Status: He is alert and oriented to person, place, and time.  Psychiatric:        Behavior: Behavior normal.      Assessment & Plan:  1. Encounter to establish care: - Patient presents today to establish care.  - Return in 4 to 6 weeks or sooner if needed for annual physical examination, labs, and health maintenance. Arrive fasting meaning having had no food and/or nothing to drink for at least 8 hours prior to appointment.  2. Alcoholic cirrhosis of liver without ascites (HCC): 3. Bleeding esophageal varices, unspecified esophageal varices type (HCC): 4. Encephalopathy: - History of encephalopathy, cirrhosis, and esophageal bleeding. Last visit with Gastroenterology for EGD was 01/12/2020 endoscopy. Would like to discuss quarterly endoscopies.  - Continue Lactulose as prescribed.  - Per patient request referral to Gastroenterology for further evaluation and management. - Ambulatory referral to Gastroenterology  5. Anxiety: 6. Depression, unspecified depression type: - Was taking Zoloft 100 mg daily and abruptly quit 2 months ago because he ran out of medication. Also, felt that maybe he didn't need the medication at the time. States today he realizes he does need the medication and would like to restart.  - States anxiety and depression primarily related to his diagnoses of cirrhosis,  encephalopathy, and bleeding varices.  - Reports increased agitation, brain fog, confusion, and bad memory. States this may or may not be related to encephalopathy.  - Denies alcohol consumption.  - Does use marijuana occasionally.  - Denies thoughts of self-harm, suicidal ideations, and homicidal ideations.  - Requesting referral to Psychiatry.  - Will begin low-dose Sertraline for anxiety and depression. Avoid driving or hazardous activity until you know how this medication will affect you. Your reactions could be impaired. Dizziness or fainting can cause falls, accidents, or severe injuries.  Common side effects include drowsiness, nausea, constipation, loss of appetite, dry mouth, increased sweating.  Call your doctor if you have pounding heartbeats or fluttering in your chest, a light-headed feeling like you may pass out, easy bruising/unusal bleeding, vision change, difficult or painful urination, impotence/sexual problems, liver problems (right-sided upper stomach pain, itching, dark urine, yellowing of skin or eyes/jaundice, low levels of sodium in the body (headache, confusion, slurred speech, severe weakness, vomiting, loss of coordination, feeling unsteady), or manic episodes (racing thoughts, increased energy, decreased need for sleep, risk-taking behavior, being agitated, talkative)  Seek medical attention immediately if you have  symptoms of serotonin syndrome such as agitation, hallucinations, fever, sweating, shivering, fast heart rate, muscle stiffness, twitching, loss of coordination, nausea, vomiting, or diarrhea  Report any new or worsening symptoms to your doctor, such as: mood or behavior changes, anxiety, panic attacks, trouble sleeping, or if you feel impulsive, irritable, agitated, hostile, aggressive, restless, hyperactive (mentally or physically), more depressed, or have thoughts about suicide or hurting yourself.  Patient verbalized understanding. - Per patient request  referral to Psychiatry for further evaluation and management. - Follow-up with primary provider in 4 to 6 weeks or sooner if needed. - sertraline (ZOLOFT) 25 MG tablet; Take 1 tablet (25 mg total) by mouth daily.  Dispense: 45 tablet; Refill: 0 - Ambulatory referral to Psychiatry  7. Influenza vaccine administered: - Influenza vaccine administered during today's visit.   Ricky Stabs, NP 05/16/2020, 2:12 PM Primary Care at [facility]

## 2020-05-16 ENCOUNTER — Encounter: Payer: Self-pay | Admitting: Family

## 2020-05-16 ENCOUNTER — Ambulatory Visit (INDEPENDENT_AMBULATORY_CARE_PROVIDER_SITE_OTHER): Payer: Medicare Other | Admitting: Family

## 2020-05-16 ENCOUNTER — Other Ambulatory Visit: Payer: Self-pay

## 2020-05-16 VITALS — BP 118/77 | HR 65 | Ht 66.97 in | Wt 136.8 lb

## 2020-05-16 DIAGNOSIS — G9340 Encephalopathy, unspecified: Secondary | ICD-10-CM | POA: Diagnosis not present

## 2020-05-16 DIAGNOSIS — K7682 Hepatic encephalopathy: Secondary | ICD-10-CM | POA: Insufficient documentation

## 2020-05-16 DIAGNOSIS — N62 Hypertrophy of breast: Secondary | ICD-10-CM | POA: Insufficient documentation

## 2020-05-16 DIAGNOSIS — F32A Depression, unspecified: Secondary | ICD-10-CM

## 2020-05-16 DIAGNOSIS — I8501 Esophageal varices with bleeding: Secondary | ICD-10-CM | POA: Diagnosis not present

## 2020-05-16 DIAGNOSIS — F419 Anxiety disorder, unspecified: Secondary | ICD-10-CM

## 2020-05-16 DIAGNOSIS — D649 Anemia, unspecified: Secondary | ICD-10-CM | POA: Insufficient documentation

## 2020-05-16 DIAGNOSIS — Z23 Encounter for immunization: Secondary | ICD-10-CM | POA: Diagnosis not present

## 2020-05-16 DIAGNOSIS — F418 Other specified anxiety disorders: Secondary | ICD-10-CM | POA: Insufficient documentation

## 2020-05-16 DIAGNOSIS — K7010 Alcoholic hepatitis without ascites: Secondary | ICD-10-CM | POA: Insufficient documentation

## 2020-05-16 DIAGNOSIS — Z7689 Persons encountering health services in other specified circumstances: Secondary | ICD-10-CM

## 2020-05-16 DIAGNOSIS — K7290 Hepatic failure, unspecified without coma: Secondary | ICD-10-CM | POA: Insufficient documentation

## 2020-05-16 DIAGNOSIS — K7030 Alcoholic cirrhosis of liver without ascites: Secondary | ICD-10-CM | POA: Diagnosis not present

## 2020-05-16 DIAGNOSIS — E221 Hyperprolactinemia: Secondary | ICD-10-CM | POA: Insufficient documentation

## 2020-05-16 MED ORDER — SERTRALINE HCL 25 MG PO TABS: 25.0000 mg | ORAL_TABLET | Freq: Every day | ORAL | 0 refills | Status: DC

## 2020-05-16 NOTE — Patient Instructions (Addendum)
Return in 4 to 6 weeks or sooner if needed for annual physical examination, labs, and health maintenance. Arrive fasting meaning having had no food and/or nothing to drink for at least 8 hours prior to appointment.  Zoloft for anxiety and depression.   Patient request, referral to Psychiatry for anxiety and depression.   Patient request, referral to Gastroenterology.  Thank you for choosing Primary Care at [facility] for your medical home!    [name] was seen by Rema Fendt, NP today.   [name]'s primary care provider is Rema Fendt, NP.   For the best care possible,  you should try to see Ricky Stabs, NP whenever you come to clinic.   We look forward to seeing you again soon!  If you have any questions about your visit today,  please call us at [phone]  Or feel free to reach your provider via MyChart.    Esophageal Varices  Esophageal varices are enlarged veins in the part of the body that moves food from the mouth to the stomach (esophagus). They develop when extra blood is forced to flow through these veins because the blood's normal flow is blocked. Without treatment, esophageal varices eventually break and bleed (hemorrhage), which can be life-threatening. What are the causes? This condition may be caused by:  Scarring of the liver due to alcoholism. This is the most common cause.  Long-term liver disease.  Severe heart failure.  A blood clot in a vein that supplies the liver.  A disease that causes inflammation in the organs and other body areas. What are the signs or symptoms? Esophageal varices usually do not cause symptoms unless they start to bleed. Symptoms of bleeding esophageal varices include:  Vomiting material that is bright red or that is black and looks like coffee grounds.  Coughing up blood.  Stools (feces) that look black and tarry.  Dizziness or light-headedness.  Low blood pressure.  Loss of  consciousness. How is this diagnosed? This condition is diagnosed with a procedure called endoscopy. During endoscopy, your health care provider uses a flexible tube with a small camera on the end of it (endoscope) to look down your throat and examine your esophagus. You may also have other tests, including:  Imaging tests, such as a CT scan or ultrasound.  Blood tests. How is this treated? This condition may be treated with:  Medicines. Medicines are usually used to treat varices that are not bleeding.  Procedures. Procedures are done to treat varices that are bleeding. They stop bleeding, or reduce pressure and the risk of bleeding. Procedures include: ? Placing an elastic band around the varices to keep them from bleeding. ? Replacing blood that you have lost due to bleeding. This may include getting a transfusion of blood or parts of blood, such as platelets or clotting factors. ? You may be given antibiotic medicine to help prevent infection. ? Getting an injection into the varices that causes it to shrink and close (sclerotherapy). You may also be given medicines that tighten blood vessels or change blood flow. ? Placing a balloon in the esophagus and inflating it. The balloon applies pressure to the bleeding veins to help stop the bleeding. ? Placing a small tube within the veins in the liver. This decreases blood flow and pressure in the esophageal varices. If other treatments do not work, you may need a liver transplant. Follow these instructions at home: Medicines  Take over-the-counter and prescription medicines only as  told by your health care provider.  If you were prescribed an antibiotic medicine, take it as told by your health care provider. Do not stop taking the antibiotic even if you start to feel better.  Do not take any NSAIDs (such as aspirin or ibuprofen) before first getting approval from your health care provider. General instructions  Do not drink  alcohol.  Return to your normal activities as told by your health care provider. Ask your health care provider what activities are safe for you.  Avoid vigorous physical activity. Ask your health care provider what exercises are safe for you.  Keep all follow-up visits.   Contact a health care provider if:  You have pain in the abdomen.  You are unable to eat or drink. Get help right away if:  You vomit blood or have blood in your stool.  You have stools that look black or tarry.  You have chest pain.  You feel dizzy or have low blood pressure.  You lose consciousness. These symptoms may represent a serious problem that is an emergency. Do not wait to see if the symptoms will go away. Get medical help right away. Call your local emergency services (911 in the [place] Do not drive yourself to the hospital. Summary  Esophageal varices are enlarged veins in the esophagus, the part of your body that moves food from your mouth to your stomach.  Without treatment, esophageal varices eventually break and bleed, which can be life-threatening.  Esophageal varices usually do not cause symptoms unless they start to bleed.  Keep all follow-up visits. This is important. This information is not intended to replace advice given to you by your health care provider. Make sure you discuss any questions you have with your health care provider. Document Revised: 08/02/2019 Document Reviewed: 08/02/2019 Elsevier Patient Education  2021 [employer].

## 2020-05-16 NOTE — Progress Notes (Signed)
Establish care Need new GI referral Referral to Psychiatry Desires flu

## 2020-05-18 ENCOUNTER — Encounter: Payer: Self-pay | Admitting: Family

## 2020-05-18 ENCOUNTER — Telehealth: Payer: Self-pay

## 2020-05-18 DIAGNOSIS — I8501 Esophageal varices with bleeding: Secondary | ICD-10-CM

## 2020-05-18 MED ORDER — PANTOPRAZOLE SODIUM 40 MG PO TBEC: 40.0000 mg | DELAYED_RELEASE_TABLET | Freq: Two times a day (BID) | ORAL | 3 refills | Status: DC

## 2020-05-18 NOTE — Telephone Encounter (Signed)
Please call patient with update.   Pantoprazole prescribed and sent to pharmacy on file.   Nadolol prescribed 03/19/2020 for a 6 month supply.   Will need updated blood work before Potassium Chloride can be refilled. Most recent potassium level normal.

## 2020-05-18 NOTE — Telephone Encounter (Signed)
Spoke to pt about Rx request.

## 2020-05-21 MED FILL — NADOLOL 20 MG TAB: 20 | 30 days supply | Qty: 30 | Fill #2

## 2020-06-01 ENCOUNTER — Other Ambulatory Visit (INDEPENDENT_AMBULATORY_CARE_PROVIDER_SITE_OTHER): Payer: Self-pay | Admitting: Primary Care

## 2020-06-04 ENCOUNTER — Encounter: Payer: Self-pay | Admitting: Family

## 2020-06-04 MED ORDER — LACTULOSE ENCEPHALOPATHY 10 GM/15ML PO SOLN: 15.0000 g | Freq: Two times a day (BID) | ORAL | 5 refills | Status: DC

## 2020-06-04 NOTE — Telephone Encounter (Signed)
Please call patient with update.   Medication refilled per request.   Please keep all appointments with Gastroenterology for management of encephalopathy, bleeding varices, and cirrhosis.

## 2020-06-04 NOTE — Telephone Encounter (Signed)
Request sent to PCP to refill if appropriate.  

## 2020-06-04 NOTE — Addendum Note (Signed)
Addended by: Rema Fendt on: 06/04/2020 05:41 PM   Modules accepted: Orders

## 2020-06-05 ENCOUNTER — Telehealth: Payer: Self-pay

## 2020-06-05 NOTE — Telephone Encounter (Signed)
Pt advised medication refilled, make sure to keep f/u GI appts

## 2020-06-06 ENCOUNTER — Telehealth: Payer: Self-pay | Admitting: Gastroenterology

## 2020-06-06 NOTE — Telephone Encounter (Signed)
Is this patient specifically requesting me or just to our practice? I've been out of the office this week covering the hospital, so would be surprised if I am doc of day covering outpatient consults, but if so, let me know and will review his file.

## 2020-06-06 NOTE — Telephone Encounter (Signed)
Hey Dr Adela Lank, this pt is being referred to [place] for Bleeding esophageal varices, unspecified esophageal varices type (HCC), Alcoholic cirrhosis of liver without ascites (HCC), and Encephalopathy but it looks like the pt was seen by Dr Levora Angel on 12/2019, records are in epic for review, please advise on scheduling. Thank you!

## 2020-06-18 ENCOUNTER — Ambulatory Visit (INDEPENDENT_AMBULATORY_CARE_PROVIDER_SITE_OTHER): Payer: Medicare Other | Admitting: Family

## 2020-06-18 ENCOUNTER — Encounter: Payer: Self-pay | Admitting: Family

## 2020-06-18 ENCOUNTER — Other Ambulatory Visit: Payer: Self-pay

## 2020-06-18 VITALS — BP 118/79 | HR 72 | Wt 141.4 lb

## 2020-06-18 DIAGNOSIS — G9340 Encephalopathy, unspecified: Secondary | ICD-10-CM

## 2020-06-18 DIAGNOSIS — Z130 Encounter for screening for diseases of the blood and blood-forming organs and certain disorders involving the immune mechanism: Secondary | ICD-10-CM

## 2020-06-18 DIAGNOSIS — Z13228 Encounter for screening for other metabolic disorders: Secondary | ICD-10-CM | POA: Diagnosis not present

## 2020-06-18 DIAGNOSIS — E7489 Other specified disorders of carbohydrate metabolism: Secondary | ICD-10-CM | POA: Diagnosis not present

## 2020-06-18 DIAGNOSIS — I8501 Esophageal varices with bleeding: Secondary | ICD-10-CM

## 2020-06-18 DIAGNOSIS — F419 Anxiety disorder, unspecified: Secondary | ICD-10-CM

## 2020-06-18 DIAGNOSIS — F32A Depression, unspecified: Secondary | ICD-10-CM

## 2020-06-18 DIAGNOSIS — Z0000 Encounter for general adult medical examination without abnormal findings: Secondary | ICD-10-CM

## 2020-06-18 DIAGNOSIS — Z13220 Encounter for screening for lipoid disorders: Secondary | ICD-10-CM

## 2020-06-18 DIAGNOSIS — Z1329 Encounter for screening for other suspected endocrine disorder: Secondary | ICD-10-CM

## 2020-06-18 DIAGNOSIS — K7030 Alcoholic cirrhosis of liver without ascites: Secondary | ICD-10-CM

## 2020-06-18 MED ORDER — SERTRALINE HCL 25 MG PO TABS: 25.0000 mg | ORAL_TABLET | Freq: Every day | ORAL | 0 refills | Status: DC

## 2020-06-18 NOTE — Progress Notes (Signed)
Patient ID: Marcie BalDaniel W Dubs, male    DOB: 1980-05-03  MRN: [id number]  CC: Anxiety and Depression Follow-Up  Subjective: Cora CollumDaniel Tarnowski is a 40 y.o. male who presents for anxiety and depression follow-up.  1. ANXIETY AND DEPRESSION FOLLOW-UP: 05/16/2020: - Visit with me. - Was taking Zoloft 100 mg daily and abruptly quit 2 months ago because he ran out of medication. Also, felt that maybe he didn't need the medication at the time. States today he realizes he does need the medication and would like to restart.  - States anxiety and depression primarily related to his diagnoses of cirrhosis, encephalopathy, and bleeding varices.  - Reports increased agitation, brain fog, confusion, and bad memory. States this may or may not be related to encephalopathy.  - Denies alcohol consumption.  - Does use marijuana occasionally.  - Denies thoughts of self-harm, suicidal ideations, and homicidal ideations.  - Requesting referral to Psychiatry.  - Will begin low-dose Sertraline, 25 mg daily, for anxiety and depression.  - Per patient request referral to Psychiatry for further evaluation and management. - Follow-up with primary provider in 4 to 6 weeks or sooner if needed.  06/18/2020: Today reports doing well taking Zoloft, without side effects or concerns. States he and his family/freinds have seen a significant improvement with his anxiety and depression since beginning medication. Requests refills.    Patient Active Problem List   Diagnosis Date Noted  . Alcoholic hepatitis 05/16/2020  . Anemia 05/16/2020  . Encephalopathy, portal systemic (HCC) 05/16/2020  . Anxiety 05/16/2020  . Gynecomastia 05/16/2020  . 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 Esophageal varices with bleeding (HCC) 05/16/2020  . Encephalopathy 05/16/2020  . GI bleed 10/09/2019  . Hematemesis 10/09/2019  . Leukocytosis 10/09/2019  . Elevated liver enzymes  10/09/2019  . Elevated lactic acid level 09/02/2019  . Bleeding esophageal varices (HCC) 09/02/2019  . Shock (HCC) 08/30/2019  . Electrolyte disturbance 07/16/2019  . Lactic acidosis 07/16/2019  . Hyperammonemia (HCC) 07/16/2019  . Hyperbilirubinemia 07/16/2019  . Alcoholic cirrhosis of liver without ascites (HCC) 07/24/2016  . Acute encephalopathy   . Acute upper GI bleed   . Ascites   . Distended abdomen   . Hemorrhagic shock (HCC)   . Encounter for palliative care   . Goals of care, counseling/discussion   . Encephalopathy acute 08/27/2015  . Alcoholic cirrhosis (HCC)   . Acute hypoxemic respiratory failure (HCC)   . SBP (spontaneous bacterial peritonitis) (HCC)   . Hypocalcemia 08/21/2015  . Hypomagnesemia 08/21/2015  . Acute blood loss anemia 08/20/2015  . Alcohol abuse 08/20/2015  . Elevated INR 08/20/2015  . Hypoalbuminemia 08/20/2015  . Hypokalemia 08/20/2015     Current Outpatient Medications on File Prior to Visit  Medication Sig Dispense Refill  . Ferrous Sulfate (IRON) 325 (65 Fe) MG TABS Take 1 tablet (325 mg total) by mouth daily. 20 tablet 0  . folic acid (FOLVITE) 1 MG tablet Take 1 mg by mouth daily at 12 noon.     . hydrocortisone cream 1 % Apply 1 application topically daily as needed for itching.    . lactulose, encephalopathy, (CHRONULAC) 10 GM/15ML SOLN Take 22.5 mLs (15 g total) by mouth in the morning and at bedtime. 237 mL 5  . Magnesium 500 MG TABS Take 500 mg by mouth in the morning and at bedtime.    . Multiple Vitamin (MULTIVITAMIN WITH MINERALS) TABS tablet Take 1 tablet by mouth daily. (Patient taking  differently: Take 1 tablet by mouth daily with supper.)    . nadolol (CORGARD) 20 MG tablet Take 1 tablet (20 mg total) by mouth daily. 90 tablet 1  . pantoprazole (PROTONIX) 40 MG tablet Take 1 tablet (40 mg total) by mouth 2 (two) times daily. 60 tablet 3  . thiamine (VITAMIN B-1) 100 MG tablet Take 100 mg by mouth daily at 12 noon.      No  current facility-administered medications on file prior to visit.    Allergies  Allergen Reactions  . Penicillins Hives    Infant    Social History   Socioeconomic History  . Marital status: Single    Spouse name: Not on file  . Number of children: Not on file  . Years of education: Not on file  . Highest education level: Not on file  Occupational History  . Not on file  Tobacco Use  . Smoking status: Former Smoker    Packs/day: 1.00    Types: Cigarettes  . Smokeless tobacco: Never Used  Vaping Use  . Vaping Use: Never used  Substance and Sexual Activity  . Alcohol use: Not Currently    Alcohol/week: 3.0 standard drinks    Types: 3 Standard drinks or equivalent per week    Comment: Alcoholism  . Drug use: Yes    Frequency: 7.0 times per week    Types: Marijuana  . Sexual activity: Not Currently  Other Topics Concern  . Not on file  Social History Narrative  . Not on file   Social Determinants of Health   Financial Resource Strain: Not on file  Food Insecurity: Not on file  Transportation Needs: Not on file  Physical Activity: Not on file  Stress: Not on file  Social Connections: Not on file  Intimate Partner Violence: Not on file    Family History  Problem Relation Age of Onset  . Peptic Ulcer Paternal Aunt   . Crohn's disease Maternal Uncle   . Heart attack Maternal Uncle   . Heart attack Maternal Grandfather   . Stroke Maternal Grandmother   . Stroke Paternal Grandfather     Past Surgical History:  Procedure Laterality Date  . ESOPHAGEAL BANDING  08/30/2019   Procedure: ESOPHAGEAL BANDING;  Surgeon: Kerin Salen, MD;  Location: [facility] ENDOSCOPY;  Service: Gastroenterology;;  . ESOPHAGEAL BANDING  10/10/2019   Procedure: ESOPHAGEAL BANDING;  Surgeon: Kerin Salen, MD;  Location: [facility] ENDOSCOPY;  Service: Gastroenterology;;  . ESOPHAGEAL BANDING N/A 12/23/2019   Procedure: ESOPHAGEAL BANDING;  Surgeon: Kathi Der, MD;  Location: [facility] ENDOSCOPY;  Service:  Gastroenterology;  Laterality: N/A;  . ESOPHAGEAL BANDING N/A 01/12/2020   Procedure: ESOPHAGEAL BANDING;  Surgeon: Kathi Der, MD;  Location: [facility] ENDOSCOPY;  Service: Gastroenterology;  Laterality: N/A;  . ESOPHAGOGASTRODUODENOSCOPY Left 08/21/2015   Procedure: ESOPHAGOGASTRODUODENOSCOPY (EGD);  Surgeon: Charlott Rakes, MD;  Location: [facility] ENDOSCOPY;  Service: Endoscopy;  Laterality: Left;  . ESOPHAGOGASTRODUODENOSCOPY N/A 12/05/2019   Procedure: ESOPHAGOGASTRODUODENOSCOPY (EGD);  Surgeon: Kathi Der, MD;  Location: [facility] ENDOSCOPY;  Service: Gastroenterology;  Laterality: N/A;  . ESOPHAGOGASTRODUODENOSCOPY (EGD) WITH PROPOFOL N/A 08/30/2019   Procedure: ESOPHAGOGASTRODUODENOSCOPY (EGD) WITH PROPOFOL;  Surgeon: Kerin Salen, MD;  Location[facility] ENDOSCOPY;  Service: Gastroenterology;  Laterality: N/A;  . ESOPHAGOGASTRODUODENOSCOPY (EGD) WITH PROPOFOL N/A 10/10/2019   Procedure: ESOPHAGOGASTRODUODENOSCOPY (EGD) WITH PROPOFOL;  Surgeon: Kerin Salen, MD;  Location: [facility] ENDOSCOPY;  Service: Gastroenterology;  Laterality: N/A;  . ESOPHAGOGASTRODUODENOSCOPY (EGD) WITH PROPOFOL N/A 12/23/2019   Procedure: ESOPHAGOGASTRODUODENOSCOPY (EGD) WITH PROPOFOL;  Surgeon:  Kathi Der, MD;  Location: [facility] ENDOSCOPY;  Service: Gastroenterology;  Laterality: N/A;  . ESOPHAGOGASTRODUODENOSCOPY (EGD) WITH PROPOFOL N/A 01/12/2020   Procedure: ESOPHAGOGASTRODUODENOSCOPY (EGD) WITH PROPOFOL;  Surgeon: Kathi Der, MD;  Location: [facility] ENDOSCOPY;  Service: Gastroenterology;  Laterality: N/A;  . GASTRIC VARICES BANDING  12/05/2019   Procedure: GASTRIC VARICES BANDING;  Surgeon: Kathi Der, MD;  Location: [facility] ENDOSCOPY;  Service: Gastroenterology;;  placed 6 bands  . HOT HEMOSTASIS N/A 12/05/2019   Procedure: HOT HEMOSTASIS (ARGON PLASMA COAGULATION/BICAP);  Surgeon: Kathi Der, MD;  Location: [facility] ENDOSCOPY;  Service: Gastroenterology;  Laterality: N/A;  . IR RADIOLOGIST EVAL & MGMT  12/13/2019    ROS: Review  of Systems Negative except as stated above  PHYSICAL EXAM: BP 118/79 (BP Location: Left Arm, Patient Position: Sitting)   Pulse 72   Wt 141 lb 6.4 oz (64.1 kg)   SpO2 96%   BMI 22.17 kg/m   Wt Readings from Last 3 Encounters:  06/18/20 141 lb 6.4 oz (64.1 kg)  05/16/20 136 lb 12.8 oz (62.1 kg)  12/23/19 134 lb 14.7 oz (61.2 kg)    Physical Exam Exam conducted with a chaperone present.  Constitutional:      Appearance: He is normal weight.  HENT:     Head: Normocephalic and atraumatic.     Right Ear: Tympanic membrane, ear canal and external ear normal.     Left Ear: Tympanic membrane, ear canal and external ear normal.     Nose: Nose normal.     Mouth/Throat:     Mouth: Mucous membranes are moist.     Pharynx: Oropharynx is clear.  Eyes:     Extraocular Movements: Extraocular movements intact.     Conjunctiva/sclera: Conjunctivae normal.     Pupils: Pupils are equal, round, and reactive to light.  Cardiovascular:     Rate and Rhythm: Normal rate and regular rhythm.     Pulses: Normal pulses.     Heart sounds: Normal heart sounds.  Pulmonary:     Effort: Pulmonary effort is normal.     Breath sounds: Normal breath sounds.  Abdominal:     General: Abdomen is flat. Bowel sounds are normal.     Palpations: Abdomen is soft.  Genitourinary:    Penis: Normal.      Testes: Normal.     Comments: Margorie John, CMA present during examination.  Musculoskeletal:        General: Normal range of motion.     Cervical back: Normal range of motion and neck supple.  Skin:    General: Skin is warm and dry.     Capillary Refill: Capillary refill takes less than 2 seconds.     Findings: Acne present.     Comments: Mild acne present on generalized back.  Neurological:     General: No focal deficit present.     Mental Status: He is alert and oriented to person, place, and time.  Psychiatric:        Mood and Affect: Mood normal.        Behavior: Behavior normal.      ASSESSMENT AND PLAN: 1. Annual physical exam: - Counseled on 150 minutes of exercise per week as tolerated, healthy eating (including decreased daily intake of saturated fats, cholesterol, added sugars, sodium), STI prevention, and routine healthcare maintenance.  2. Screening for metabolic disorder: - CMP last obtained 12/07/2019.  3. Screening for deficiency anemia: - CBC last obtained 12/22/2019.  4. Other specified disorders of carbohydrate metabolism (HCC)  -  Hemoglobin A1c to screen for pre-diabetes/diabetes. - Hemoglobin A1c  5. Screening cholesterol level: - Lipid panel to screen for high cholesterol.  - Lipid Panel  6. Thyroid disorder screen: - TSH to check thyroid function.  - TSH  7. Depression, unspecified depression type: 8. Anxiety: - Doing well on current regimen. - Continue Sertraline as prescribed.  - Reports he has not been to Psychiatry referral since last visit. Says he may reconsider going some time in the future but feeling well for now. - Follow-up with primary provider in 3 months or sooner if needed.  - sertraline (ZOLOFT) 25 MG tablet; Take 1 tablet (25 mg total) by mouth daily.  Dispense: 90 tablet; Refill: 0   Patient was given the opportunity to ask questions.  Patient verbalized understanding of the plan and was able to repeat key elements of the plan. Patient was given clear instructions to go to Emergency Department or return to medical center if symptoms don't improve, worsen, or new problems develop.The patient verbalized understanding.   Orders Placed This Encounter  Procedures  . Hemoglobin A1c  . Lipid Panel  . TSH     Requested Prescriptions   Signed Prescriptions Disp Refills  . sertraline (ZOLOFT) 25 MG tablet 90 tablet 0    Sig: Take 1 tablet (25 mg total) by mouth daily.   Follow-up as scheduled with primary provider.   Rema Fendt, NP

## 2020-06-18 NOTE — Progress Notes (Signed)
Annual exam

## 2020-06-18 NOTE — Patient Instructions (Signed)
 Annual physical exam and labs today. Will call with results.   Preventive Care 41-41 Years Old, Male Preventive care refers to lifestyle choices and visits with your health care provider that can promote health and wellness. This includes:  A yearly physical exam. This is also called an annual wellness visit.  Regular dental and eye exams.  Immunizations.  Screening for certain conditions.  Healthy lifestyle choices, such as: ? Eating a healthy diet. ? Getting regular exercise. ? Not using drugs or products that contain nicotine and tobacco. ? Limiting alcohol use. What can I expect for my preventive care visit? Physical exam Your health care provider will check your:  Height and weight. These may be used to calculate your BMI (body mass index). BMI is a measurement that tells if you are at a healthy weight.  Heart rate and blood pressure.  Body temperature.  Skin for abnormal spots. Counseling Your health care provider may ask you questions about your:  Past medical problems.  Family's medical history.  Alcohol, tobacco, and drug use.  Emotional well-being.  Home life and relationship well-being.  Sexual activity.  Diet, exercise, and sleep habits.  Work and work environment.  Access to firearms. What immunizations do I need? Vaccines are usually given at various ages, according to a schedule. Your health care provider will recommend vaccines for you based on your age, medical history, and lifestyle or other factors, such as travel or where you work.   What tests do I need? Blood tests  Lipid and cholesterol levels. These may be checked every 5 years, or more often if you are over 50 years old.  Hepatitis C test.  Hepatitis B test. Screening  Lung cancer screening. You may have this screening every year starting at age 55 if you have a 30-pack-year history of smoking and currently smoke or have quit within the past 15 years.  Prostate cancer  screening. Recommendations will vary depending on your family history and other risks.  Genital exam to check for testicular cancer or hernias.  Colorectal cancer screening. ? All adults should have this screening starting at age 50 and continuing until age 75. ? Your health care provider may recommend screening at age 45 if you are at increased risk. ? You will have tests every 1-10 years, depending on your results and the type of screening test.  Diabetes screening. ? This is done by checking your blood sugar (glucose) after you have not eaten for a while (fasting). ? You may have this done every 1-3 years.  STD (sexually transmitted disease) testing, if you are at risk. Follow these instructions at home: Eating and drinking  Eat a diet that includes fresh fruits and vegetables, whole grains, lean protein, and low-fat dairy products.  Take vitamin and mineral supplements as recommended by your health care provider.  Do not drink alcohol if your health care provider tells you not to drink.  If you drink alcohol: ? Limit how much you have to 0-2 drinks a day. ? Be aware of how much alcohol is in your drink. In the [place] one drink equals one 12 oz bottle of beer (355 mL), one 5 oz glass of wine (148 mL), or one 1 oz glass of hard liquor (44 mL).   Lifestyle  Take daily care of your teeth and gums. Brush your teeth every morning and night with fluoride toothpaste. Floss one time each day.  Stay active. Exercise for at least 30 minutes 5 or more   days each week.  Do not use any products that contain nicotine or tobacco, such as cigarettes, e-cigarettes, and chewing tobacco. If you need help quitting, ask your health care provider.  Do not use drugs.  If you are sexually active, practice safe sex. Use a condom or other form of protection to prevent STIs (sexually transmitted infections).  If told by your health care provider, take low-dose aspirin daily starting at age 50.  Find  healthy ways to cope with stress, such as: ? Meditation, yoga, or listening to music. ? Journaling. ? Talking to a trusted person. ? Spending time with friends and family. Safety  Always wear your seat belt while driving or riding in a vehicle.  Do not drive: ? If you have been drinking alcohol. Do not ride with someone who has been drinking. ? When you are tired or distracted. ? While texting.  Wear a helmet and other protective equipment during sports activities.  If you have firearms in your house, make sure you follow all gun safety procedures. What's next?  Go to your health care provider once a year for an annual wellness visit.  Ask your health care provider how often you should have your eyes and teeth checked.  Stay up to date on all vaccines. This information is not intended to replace advice given to you by your health care provider. Make sure you discuss any questions you have with your health care provider. Document Revised: 01/11/2019 Document Reviewed: 04/08/2018 Elsevier Patient Education  2021 [employer]  

## 2020-06-19 ENCOUNTER — Other Ambulatory Visit: Payer: Self-pay

## 2020-06-19 DIAGNOSIS — F32A Depression, unspecified: Secondary | ICD-10-CM

## 2020-06-19 DIAGNOSIS — F419 Anxiety disorder, unspecified: Secondary | ICD-10-CM

## 2020-06-19 LAB — LIPID PANEL
Chol/HDL Ratio: 3.8 ratio (ref 0.0–5.0)
Cholesterol, Total: 138 mg/dL (ref 100–199)
HDL: 36 mg/dL — ABNORMAL LOW (ref >39)
LDL Chol Calc (NIH): 78 mg/dL (ref 0–99)
Triglycerides: 138 mg/dL (ref 0–149)
VLDL Cholesterol Cal: 24 mg/dL (ref 5–40)

## 2020-06-19 LAB — HEMOGLOBIN A1C
Est. average glucose Bld gHb Est-mCnc: 97 mg/dL
Hgb A1c MFr Bld: 5 % (ref 4.8–5.6)

## 2020-06-19 LAB — TSH: TSH: 2.24 u[IU]/mL (ref 0.450–4.500)

## 2020-06-19 MED ORDER — SERTRALINE HCL 25 MG PO TABS: 25.0000 mg | ORAL_TABLET | Freq: Every day | ORAL | 0 refills | Status: DC

## 2020-06-19 NOTE — Progress Notes (Signed)
Zoloft reordered

## 2020-06-19 NOTE — Progress Notes (Signed)
Please call patient with update.   No pre-diabetes/diabetes.   Thyroid normal.  HDL, sometimes called "good cholesterol", lower than normal. This may increase risk of heart attack and/or stroke. Consider eating more fruits, vegetables, and lean baked meats such as chicken or fish. Moderate intensity exercise at least 150 minutes as tolerated per week may help as well.

## 2020-06-20 MED FILL — NADOLOL 20 MG TAB: 20 | 30 days supply | Qty: 30 | Fill #3

## 2020-06-28 NOTE — Telephone Encounter (Signed)
Hey Dr Adela Lank, yes the pt requested to see you, thank you!

## 2020-06-28 NOTE — Telephone Encounter (Signed)
Okay, thanks, I will accept his case and can see him in the office. Thanks

## 2020-07-12 ENCOUNTER — Telehealth (HOSPITAL_COMMUNITY): Payer: Self-pay | Admitting: Radiology

## 2020-07-12 NOTE — Telephone Encounter (Signed)
Called pt about TIPS procedure with Dr. Deanne Coffer. Left VM. JM

## 2020-07-16 MED FILL — NADOLOL 20 MG TAB: 20 | 30 days supply | Qty: 30 | Fill #4

## 2020-07-17 ENCOUNTER — Encounter: Payer: Self-pay | Admitting: Family

## 2020-07-20 MED ORDER — LACTULOSE ENCEPHALOPATHY 10 GM/15ML PO SOLN: 15.0000 g | Freq: Two times a day (BID) | ORAL | 5 refills | Status: DC

## 2020-07-20 NOTE — Addendum Note (Signed)
Addended by: Rema Fendt on: 07/20/2020 12:25 PM   Modules accepted: Orders

## 2020-07-20 NOTE — Telephone Encounter (Signed)
Lactulose refilled per patient request.

## 2020-07-28 ENCOUNTER — Other Ambulatory Visit: Payer: Self-pay

## 2020-08-15 ENCOUNTER — Other Ambulatory Visit (INDEPENDENT_AMBULATORY_CARE_PROVIDER_SITE_OTHER): Payer: Medicare Other

## 2020-08-15 ENCOUNTER — Ambulatory Visit (INDEPENDENT_AMBULATORY_CARE_PROVIDER_SITE_OTHER): Payer: Medicare Other | Admitting: Gastroenterology

## 2020-08-15 ENCOUNTER — Other Ambulatory Visit: Payer: Self-pay

## 2020-08-15 ENCOUNTER — Encounter: Payer: Self-pay | Admitting: Gastroenterology

## 2020-08-15 VITALS — BP 108/62 | HR 66 | Ht 67.0 in | Wt 139.0 lb

## 2020-08-15 DIAGNOSIS — K219 Gastro-esophageal reflux disease without esophagitis: Secondary | ICD-10-CM

## 2020-08-15 DIAGNOSIS — K7030 Alcoholic cirrhosis of liver without ascites: Secondary | ICD-10-CM | POA: Diagnosis not present

## 2020-08-15 DIAGNOSIS — K7290 Hepatic failure, unspecified without coma: Secondary | ICD-10-CM

## 2020-08-15 DIAGNOSIS — I8511 Secondary esophageal varices with bleeding: Secondary | ICD-10-CM

## 2020-08-15 DIAGNOSIS — I8501 Esophageal varices with bleeding: Secondary | ICD-10-CM

## 2020-08-15 DIAGNOSIS — K7682 Hepatic encephalopathy: Secondary | ICD-10-CM

## 2020-08-15 LAB — CBC WITH DIFFERENTIAL/PLATELET
Basophils Absolute: 0.1 10*3/uL (ref 0.0–0.1)
Basophils Relative: 1.9 % (ref 0.0–3.0)
Eosinophils Absolute: 0.2 10*3/uL (ref 0.0–0.7)
Eosinophils Relative: 2.8 % (ref 0.0–5.0)
HCT: 46.6 % (ref 39.0–52.0)
Hemoglobin: 16.2 g/dL (ref 13.0–17.0)
Lymphocytes Relative: 13.3 % (ref 12.0–46.0)
Lymphs Abs: 1 10*3/uL (ref 0.7–4.0)
MCHC: 34.7 g/dL (ref 30.0–36.0)
MCV: 95.8 fl (ref 78.0–100.0)
Monocytes Absolute: 1 10*3/uL (ref 0.1–1.0)
Monocytes Relative: 13.6 % — ABNORMAL HIGH (ref 3.0–12.0)
Neutro Abs: 4.9 10*3/uL (ref 1.4–7.7)
Neutrophils Relative %: 68.4 % (ref 43.0–77.0)
Platelets: 156 10*3/uL (ref 150.0–400.0)
RBC: 4.87 Mil/uL (ref 4.22–5.81)
RDW: 13.5 % (ref 11.5–15.5)
WBC: 7.2 10*3/uL (ref 4.0–10.5)

## 2020-08-15 LAB — COMPREHENSIVE METABOLIC PANEL
ALT: 21 U/L (ref 0–53)
AST: 33 U/L (ref 0–37)
Albumin: 4.1 g/dL (ref 3.5–5.2)
Alkaline Phosphatase: 56 U/L (ref 39–117)
BUN: 22 mg/dL (ref 6–23)
CO2: 30 mEq/L (ref 19–32)
Calcium: 9.6 mg/dL (ref 8.4–10.5)
Chloride: 103 mEq/L (ref 96–112)
Creatinine, Ser: 0.77 mg/dL (ref 0.40–1.50)
GFR: 111.85 mL/min (ref >60.00)
Glucose, Bld: 75 mg/dL (ref 70–99)
Potassium: 4 mEq/L (ref 3.5–5.1)
Sodium: 139 mEq/L (ref 135–145)
Total Bilirubin: 0.9 mg/dL (ref 0.2–1.2)
Total Protein: 8.2 g/dL (ref 6.0–8.3)

## 2020-08-15 LAB — PROTIME-INR
INR: 1.2 ratio — ABNORMAL HIGH (ref 0.8–1.0)
Prothrombin Time: 13.3 s — ABNORMAL HIGH (ref 9.6–13.1)

## 2020-08-15 NOTE — H&P (View-Only) (Signed)
HPI :  40 y/o male with history of EtOH cirrhosis, history of variceal bleeding, history of hepatic encephalopathy, referred by Durene Fruits NP for these issues.  He has remotely seen [facility], as well as [facility], dismissed from [facility] practice, here to establish with our group this morning  He states he has had alcoholic cirrhosis for least.  Per review of epic chart he presented with his first variceal bleed in 2017.  He has had multiple admissions with variceal bleeding since that time, last was in the summer 2021.  He has had numerous banding's in the past as well as sclerotherapy with ethanolamine.  He states he has been considered for TIPS in the past but has never had it done.  He previously drank heavily, pint of vodka per day, he has not had a drink since this past August and has been doing really well with sobriety since this time.  He had a period of 5 years of sobriety since his diagnosis of cirrhosis but had a relapse last year.  He was evaluated for transplant at [facility] a few years ago but his MELD was not high enough to be listed.  Generally does not have any complaints today.  He has had jaundice in the past but has not bothered him recently.  He has had a history of ascites but he does not have problems with that now.  He does have a history of hepatic encephalopathy and takes lactulose for that daily and states it works really well for him.  If he misses a dose of lactulose he will have some constipation, so he is good about taking this.  He does take nadolol 20 mg a day, his resting heart rates in the 60s, he is not sure if he has had higher doses of nadolol in the past.  He denies any NSAID use.  His weight is stable.  He has been eating without problems but does have baseline poor appetite.  No blood in his stools.  He does have a history of reflux, has been on Protonix 40 mg twice daily more recently.  States he has been on once daily dosing in the past which worked about the same for  him.  Denies any dysphagia.  He has had a work-up for other causes of liver disease at [facility] back in 2019.  No family history of liver disease known family history of colon cancer.  He is currently unemployed on disability  His last EGD was in September 2021 by [facility], few bands were placed for persistent varices. His last imaging was a CT angio of the abdomen pelvis in August 2021, stable changes of cirrhosis without any focal lesions.  He has not had basic labs since August 2021.  Past Medical History:  Diagnosis Date  . Anxiety    Phreesia 05/14/2020  . Blood transfusion without reported diagnosis    Phreesia 05/14/2020  . Cirrhosis of liver ([place])   . Depression    Phreesia 05/14/2020  . Esophageal varices with hemorrhage ([place])   . ETOH abuse   . GERD (gastroesophageal reflux disease)   . Seizures ([place])    Phreesia 05/14/2020  . Substance abuse ([place])    Phreesia 05/14/2020     Past Surgical History:  Procedure Laterality Date  . ESOPHAGEAL BANDING  08/30/2019   Procedure: ESOPHAGEAL BANDING;  Surgeon: Ronnette Juniper, MD;  Location: [place];  Service: Gastroenterology;;  . ESOPHAGEAL BANDING  10/10/2019   Procedure: ESOPHAGEAL  BANDING;  Surgeon: Ronnette Juniper, MD;  Location: [place];  Service: Gastroenterology;;  . ESOPHAGEAL BANDING N/A 12/23/2019   Procedure: ESOPHAGEAL BANDING;  Surgeon: Otis Brace, MD;  Location: [facility];  Service: Gastroenterology;  Laterality: N/A;  . ESOPHAGEAL BANDING N/A 01/12/2020   Procedure: ESOPHAGEAL BANDING;  Surgeon: Otis Brace, MD;  Location: [facility];  Service: Gastroenterology;  Laterality: N/A;  . ESOPHAGOGASTRODUODENOSCOPY Left 08/21/2015   Procedure: ESOPHAGOGASTRODUODENOSCOPY (EGD);  Surgeon: Wilford Corner, MD;  Location: [facility];  Service: Endoscopy;  Laterality: Left;  . ESOPHAGOGASTRODUODENOSCOPY N/A 12/05/2019   Procedure: ESOPHAGOGASTRODUODENOSCOPY (EGD);  Surgeon: Otis Brace, MD;  Location: [facility]  ENDOSCOPY;  Service: Gastroenterology;  Laterality: N/A;  . ESOPHAGOGASTRODUODENOSCOPY (EGD) WITH PROPOFOL N/A 08/30/2019   Procedure: ESOPHAGOGASTRODUODENOSCOPY (EGD) WITH PROPOFOL;  Surgeon: Ronnette Juniper, MD;  Location: [place];  Service: Gastroenterology;  Laterality: N/A;  . ESOPHAGOGASTRODUODENOSCOPY (EGD) WITH PROPOFOL N/A 10/10/2019   Procedure: ESOPHAGOGASTRODUODENOSCOPY (EGD) WITH PROPOFOL;  Surgeon: Ronnette Juniper, MD;  Location: [place];  Service: Gastroenterology;  Laterality: N/A;  . ESOPHAGOGASTRODUODENOSCOPY (EGD) WITH PROPOFOL N/A 12/23/2019   Procedure: ESOPHAGOGASTRODUODENOSCOPY (EGD) WITH PROPOFOL;  Surgeon: Otis Brace, MD;  Location: [facility];  Service: Gastroenterology;  Laterality: N/A;  . ESOPHAGOGASTRODUODENOSCOPY (EGD) WITH PROPOFOL N/A 01/12/2020   Procedure: ESOPHAGOGASTRODUODENOSCOPY (EGD) WITH PROPOFOL;  Surgeon: Otis Brace, MD;  Location: [facility];  Service: Gastroenterology;  Laterality: N/A;  . GASTRIC VARICES BANDING  12/05/2019   Procedure: GASTRIC VARICES BANDING;  Surgeon: Otis Brace, MD;  Location: [facility];  Service: Gastroenterology;;  placed 6 bands  . HOT HEMOSTASIS N/A 12/05/2019   Procedure: HOT HEMOSTASIS (ARGON PLASMA COAGULATION/BICAP);  Surgeon: Otis Brace, MD;  Location: [facility] ENDOSCOPY;  Service: Gastroenterology;  Laterality: N/A;  . IR RADIOLOGIST EVAL & MGMT  12/13/2019   Family History  Problem Relation Age of Onset  . Peptic Ulcer Paternal Aunt   . Crohn's disease Maternal Uncle   . Heart attack Maternal Uncle   . Heart attack Maternal Grandfather   . Stroke Maternal Grandmother   . Stroke Paternal Grandfather    Social History   Tobacco Use  . Smoking status: Former Smoker    Packs/day: 1.00    Types: Cigarettes  . Smokeless tobacco: Never Used  Vaping Use  . Vaping Use: Never used  Substance Use Topics  . Alcohol use: Not Currently    Alcohol/week: 3.0 standard drinks    Types: 3 Standard  drinks or equivalent per week    Comment: Alcoholism  . Drug use: Yes    Frequency: 7.0 times per week    Types: Marijuana   Current Outpatient Medications  Medication Sig Dispense Refill  . Ferrous Sulfate (IRON) 325 (65 Fe) MG TABS Take 1 tablet (325 mg total) by mouth daily. 20 tablet 0  . folic acid (FOLVITE) 1 MG tablet Take 1 mg by mouth daily at 12 noon.     . hydrocortisone cream 1 % Apply 1 application topically daily as needed for itching.    . lactulose, encephalopathy, (CHRONULAC) 10 GM/15ML SOLN Take 22.5 mLs (15 g total) by mouth in the morning and at bedtime. 237 mL 5  . Magnesium 500 MG TABS Take 500 mg by mouth in the morning and at bedtime.    . Multiple Vitamin (MULTIVITAMIN WITH MINERALS) TABS tablet Take 1 tablet by mouth daily. (Patient taking differently: Take 1 tablet by mouth daily with supper.)    . nadolol (CORGARD) 20 MG tablet TAKE 1 TABLET (20 MG TOTAL) BY MOUTH  DAILY. 90 tablet 1  . pantoprazole (PROTONIX) 40 MG tablet Take 1 tablet (40 mg total) by mouth 2 (two) times daily. 60 tablet 3  . sertraline (ZOLOFT) 25 MG tablet Take 1 tablet (25 mg total) by mouth daily. 90 tablet 0  . thiamine (VITAMIN B-1) 100 MG tablet Take 100 mg by mouth daily at 12 noon.      No current facility-administered medications for this visit.   Allergies  Allergen Reactions  . Penicillins Hives    Infant     Review of Systems: All systems reviewed and negative except where noted in HPI.   Lab Results  Component Value Date   WBC 7.0 12/22/2019   HGB 12.0 (L) 12/22/2019   HCT 37.5 12/22/2019   MCV 94 12/22/2019   PLT 179 12/22/2019    Lab Results  Component Value Date   CREATININE 0.77 12/22/2019   BUN 7 12/22/2019   NA 142 12/22/2019   K 4.3 12/22/2019   CL 104 12/22/2019   CO2 25 12/22/2019    Lab Results  Component Value Date   INR 1.6 (H) 12/06/2019   INR 1.4 (H) 12/04/2019   INR 1.3 (H) 10/09/2019    Lab Results  Component Value Date   ALT 15  12/07/2019   AST 46 (H) 12/07/2019   ALKPHOS 36 (L) 12/07/2019   BILITOT 1.1 12/07/2019     Physical Exam: BP 108/62   Pulse 66   Ht _0  (1.702 m)   Wt 139 lb (63 kg)   BMI 21.77 kg/m  Constitutional: Pleasant,well-developed, male in no acute distress. HEENT: Normocephalic and atraumatic. Conjunctivae are normal. No scleral icterus. Neck supple.  Cardiovascular: Normal rate, regular rhythm.  Pulmonary/chest: Effort normal and breath sounds normal.  Abdominal: Soft, nondistended, nontender. . There are no masses palpable. No appreciable ascites Extremities: no edema Lymphadenopathy: No cervical adenopathy noted. Neurological: Alert and oriented to person place and time. Skin: Skin is warm and dry. No rashes noted. Psychiatric: Normal mood and affect. Behavior is normal.   ASSESSMENT AND PLAN: 40 year old man here for reassessment of following:  Alcoholic cirrhosis Esophageal varices Hepatic encephalopathy GERD  History as outlined above, main issue appears to be recurrent variceal bleeding that happened most recently last year in the setting of recurrent alcohol use.  He has been abstinent from alcohol since August and generally feeling really well.  His last EGD was in September at which time multiple bands were placed.  We discussed his history of cirrhosis, risks for further decompensation and HCC.  He understands this and has been abstinent from alcohol and doing a great job with this in recent months.  Moving forward we discussed plans for further surveillance.  I am recommending another endoscopy to survey his varices and apply additional band if needed.  Hopefully with sobriety his portal pressures have reduced and varices are smaller but we can perform additional banding if needed.  I discussed risk and benefits of this with him and he wants to proceed.  We will continue nadolol at present dosing given his resting heart rate in the 60s at present dose of 20 mg a day.  He  is due for [facility] screening, will refer for right upper quadrant ultrasound.  We will check basic labs today to include CBC, INR, c-Met, AFP, and check his immunity to hepatitis A and B and vaccinate if needed.  His reflux appears well controlled on twice daily dosing Protonix, will reduce to once daily dosing  and see if he does as well at that dose.  We discussed importance of follow-up for cirrhosis in our office, we will continue to see him every 6 months, or sooner with any questions or concerns.  He agrees with the plan:  Plan: - continued alcohol abstinence - EGD to be scheduled at [facility] hospital, with possible banding - RUQ [place] for [facility] screening - labs today - CBC, INR, CMET, AFP, hepatitis A total AB, hep B surface antibody - change protonix to once daily - follow up in 6 months  [place] Cellar, MD [facility]  CC: Camillia Herter, NP

## 2020-08-15 NOTE — Patient Instructions (Addendum)
If you are age 41 or older, your body mass index should be between 23-30. Your Body mass index is 21.77 kg/m. If this is out of the aforementioned range listed, please consider follow up with your Primary Care Provider.  If you are age 59 or younger, your body mass index should be between 19-25. Your Body mass index is 21.77 kg/m. If this is out of the aformentioned range listed, please consider follow up with your Primary Care Provider.   Please go to the lab in the basement of our building to have lab work done as you leave today. Hit "B" for basement when you get on the elevator.  When the doors open the lab is on your left.  We will call you with the results. Thank you.  Due to recent changes in healthcare laws, you may see the results of your imaging and laboratory studies on MyChart before your provider has had a chance to review them.  We understand that in some cases there may be results that are confusing or concerning to you. Not all laboratory results come back in the same time frame and the provider may be waiting for multiple results in order to interpret others.  Please give [place] 48 hours in order for your provider to thoroughly review all the results before contacting the office for clarification of your results.   ____________________________________________________________________________  [name] have been scheduled for an abdominal ultrasound at [facility] (1st floor of hospital) on Wednesday, 4-27 at 10:30am. Please arrive 15 minutes prior to your appointment for registration. Make certain not to have anything to eat or drink 6 hours prior to your appointment. Should you need to reschedule your appointment, please contact radiology at [phone]. This test typically takes about 30 minutes to perform.  _____________________________________________________________________________  Decrease to Protonix once a day.  Please follow up in 6 months. We will send you a reminder  when it gets closer.   Thank you for entrusting me with your care and for choosing [facility], Dr. Ileene Patrick

## 2020-08-15 NOTE — Progress Notes (Signed)
HPI :  40 y/o male with history of EtOH cirrhosis, history of variceal bleeding, history of hepatic encephalopathy, referred by Durene Fruits NP for these issues.  He has remotely seen [facility], as well as [facility], dismissed from [place] practice, here to establish with our group this morning  He states he has had alcoholic cirrhosis for least.  Per review of epic chart he presented with his first variceal bleed in 2017.  He has had multiple admissions with variceal bleeding since that time, last was in the summer 2021.  He has had numerous banding's in the past as well as sclerotherapy with ethanolamine.  He states he has been considered for TIPS in the past but has never had it done.  He previously drank heavily, pint of vodka per day, he has not had a drink since this past August and has been doing really well with sobriety since this time.  He had a period of 5 years of sobriety since his diagnosis of cirrhosis but had a relapse last year.  He was evaluated for transplant at [facility] a few years ago but his MELD was not high enough to be listed.  Generally does not have any complaints today.  He has had jaundice in the past but has not bothered him recently.  He has had a history of ascites but he does not have problems with that now.  He does have a history of hepatic encephalopathy and takes lactulose for that daily and states it works really well for him.  If he misses a dose of lactulose he will have some constipation, so he is good about taking this.  He does take nadolol 20 mg a day, his resting heart rates in the 60s, he is not sure if he has had higher doses of nadolol in the past.  He denies any NSAID use.  His weight is stable.  He has been eating without problems but does have baseline poor appetite.  No blood in his stools.  He does have a history of reflux, has been on Protonix 40 mg twice daily more recently.  States he has been on once daily dosing in the past which worked about the same for  him.  Denies any dysphagia.  He has had a work-up for other causes of liver disease at [facility] back in 2019.  No family history of liver disease known family history of colon cancer.  He is currently unemployed on disability  His last EGD was in September 2021 by [facility], few bands were placed for persistent varices. His last imaging was a CT angio of the abdomen pelvis in August 2021, stable changes of cirrhosis without any focal lesions.  He has not had basic labs since August 2021.  Past Medical History:  Diagnosis Date  . Anxiety    Phreesia 05/14/2020  . Blood transfusion without reported diagnosis    Phreesia 05/14/2020  . Cirrhosis of liver ([place])   . Depression    Phreesia 05/14/2020  . Esophageal varices with hemorrhage ([place])   . ETOH abuse   . GERD (gastroesophageal reflux disease)   . Seizures ([place])    Phreesia 05/14/2020  . Substance abuse ([place])    Phreesia 05/14/2020     Past Surgical History:  Procedure Laterality Date  . ESOPHAGEAL BANDING  08/30/2019   Procedure: ESOPHAGEAL BANDING;  Surgeon: Ronnette Juniper, MD;  Location: [place];  Service: Gastroenterology;;  . ESOPHAGEAL BANDING  10/10/2019   Procedure: ESOPHAGEAL  BANDING;  Surgeon: Ronnette Juniper, MD;  Location: [place];  Service: Gastroenterology;;  . ESOPHAGEAL BANDING N/A 12/23/2019   Procedure: ESOPHAGEAL BANDING;  Surgeon: Otis Brace, MD;  Location: [facility];  Service: Gastroenterology;  Laterality: N/A;  . ESOPHAGEAL BANDING N/A 01/12/2020   Procedure: ESOPHAGEAL BANDING;  Surgeon: Otis Brace, MD;  Location: [facility];  Service: Gastroenterology;  Laterality: N/A;  . ESOPHAGOGASTRODUODENOSCOPY Left 08/21/2015   Procedure: ESOPHAGOGASTRODUODENOSCOPY (EGD);  Surgeon: Wilford Corner, MD;  Location: [facility];  Service: Endoscopy;  Laterality: Left;  . ESOPHAGOGASTRODUODENOSCOPY N/A 12/05/2019   Procedure: ESOPHAGOGASTRODUODENOSCOPY (EGD);  Surgeon: Otis Brace, MD;  Location: [facility]  ENDOSCOPY;  Service: Gastroenterology;  Laterality: N/A;  . ESOPHAGOGASTRODUODENOSCOPY (EGD) WITH PROPOFOL N/A 08/30/2019   Procedure: ESOPHAGOGASTRODUODENOSCOPY (EGD) WITH PROPOFOL;  Surgeon: Ronnette Juniper, MD;  Location: [place];  Service: Gastroenterology;  Laterality: N/A;  . ESOPHAGOGASTRODUODENOSCOPY (EGD) WITH PROPOFOL N/A 10/10/2019   Procedure: ESOPHAGOGASTRODUODENOSCOPY (EGD) WITH PROPOFOL;  Surgeon: Ronnette Juniper, MD;  Location: [place];  Service: Gastroenterology;  Laterality: N/A;  . ESOPHAGOGASTRODUODENOSCOPY (EGD) WITH PROPOFOL N/A 12/23/2019   Procedure: ESOPHAGOGASTRODUODENOSCOPY (EGD) WITH PROPOFOL;  Surgeon: Otis Brace, MD;  [facility]: [facility];  Service: Gastroenterology;  Laterality: N/A;  . ESOPHAGOGASTRODUODENOSCOPY (EGD) WITH PROPOFOL N/A 01/12/2020   Procedure: ESOPHAGOGASTRODUODENOSCOPY (EGD) WITH PROPOFOL;  Surgeon: Otis Brace, MD;  Location: [facility];  Service: Gastroenterology;  Laterality: N/A;  . GASTRIC VARICES BANDING  12/05/2019   Procedure: GASTRIC VARICES BANDING;  Surgeon: Otis Brace, MD;  Location: [facility];  Service: Gastroenterology;;  placed 6 bands  . HOT HEMOSTASIS N/A 12/05/2019   Procedure: HOT HEMOSTASIS (ARGON PLASMA COAGULATION/BICAP);  Surgeon: Otis Brace, MD;  Location: [facility] ENDOSCOPY;  Service: Gastroenterology;  Laterality: N/A;  . IR RADIOLOGIST EVAL & MGMT  12/13/2019   Family History  Problem Relation Age of Onset  . Peptic Ulcer Paternal Aunt   . Crohn's disease Maternal Uncle   . Heart attack Maternal Uncle   . Heart attack Maternal Grandfather   . Stroke Maternal Grandmother   . Stroke Paternal Grandfather    Social History   Tobacco Use  . Smoking status: Former Smoker    Packs/day: 1.00    Types: Cigarettes  . Smokeless tobacco: Never Used  Vaping Use  . Vaping Use: Never used  Substance Use Topics  . Alcohol use: Not Currently    Alcohol/week: 3.0 standard drinks    Types: 3 Standard  drinks or equivalent per week    Comment: Alcoholism  . Drug use: Yes    Frequency: 7.0 times per week    Types: Marijuana   Current Outpatient Medications  Medication Sig Dispense Refill  . Ferrous Sulfate (IRON) 325 (65 Fe) MG TABS Take 1 tablet (325 mg total) by mouth daily. 20 tablet 0  . folic acid (FOLVITE) 1 MG tablet Take 1 mg by mouth daily at 12 noon.     . hydrocortisone cream 1 % Apply 1 application topically daily as needed for itching.    . lactulose, encephalopathy, (CHRONULAC) 10 GM/15ML SOLN Take 22.5 mLs (15 g total) by mouth in the morning and at bedtime. 237 mL 5  . Magnesium 500 MG TABS Take 500 mg by mouth in the morning and at bedtime.    . Multiple Vitamin (MULTIVITAMIN WITH MINERALS) TABS tablet Take 1 tablet by mouth daily. (Patient taking differently: Take 1 tablet by mouth daily with supper.)    . nadolol (CORGARD) 20 MG tablet TAKE 1 TABLET (20 MG TOTAL) BY MOUTH  DAILY. 90 tablet 1  . pantoprazole (PROTONIX) 40 MG tablet Take 1 tablet (40 mg total) by mouth 2 (two) times daily. 60 tablet 3  . sertraline (ZOLOFT) 25 MG tablet Take 1 tablet (25 mg total) by mouth daily. 90 tablet 0  . thiamine (VITAMIN B-1) 100 MG tablet Take 100 mg by mouth daily at 12 noon.      No current facility-administered medications for this visit.   Allergies  Allergen Reactions  . Penicillins Hives    Infant     Review of Systems: All systems reviewed and negative except where noted in HPI.   Lab Results  Component Value Date   WBC 7.0 12/22/2019   HGB 12.0 (L) 12/22/2019   HCT 37.5 12/22/2019   MCV 94 12/22/2019   PLT 179 12/22/2019    Lab Results  Component Value Date   CREATININE 0.77 12/22/2019   BUN 7 12/22/2019   NA 142 12/22/2019   K 4.3 12/22/2019   CL 104 12/22/2019   CO2 25 12/22/2019    Lab Results  Component Value Date   INR 1.6 (H) 12/06/2019   INR 1.4 (H) 12/04/2019   INR 1.3 (H) 10/09/2019    Lab Results  Component Value Date   ALT 15  12/07/2019   AST 46 (H) 12/07/2019   ALKPHOS 36 (L) 12/07/2019   BILITOT 1.1 12/07/2019     Physical Exam: BP 108/62   Pulse 66   Ht _0  (1.702 m)   Wt 139 lb (63 kg)   BMI 21.77 kg/m  Constitutional: Pleasant,well-developed, male in no acute distress. HEENT: Normocephalic and atraumatic. Conjunctivae are normal. No scleral icterus. Neck supple.  Cardiovascular: Normal rate, regular rhythm.  Pulmonary/chest: Effort normal and breath sounds normal.  Abdominal: Soft, nondistended, nontender. . There are no masses palpable. No appreciable ascites Extremities: no edema Lymphadenopathy: No cervical adenopathy noted. Neurological: Alert and oriented to person place and time. Skin: Skin is warm and dry. No rashes noted. Psychiatric: Normal mood and affect. Behavior is normal.   ASSESSMENT AND PLAN: 40 year old man here for reassessment of following:  Alcoholic cirrhosis Esophageal varices Hepatic encephalopathy GERD  History as outlined above, main issue appears to be recurrent variceal bleeding that happened most recently last year in the setting of recurrent alcohol use.  He has been abstinent from alcohol since August and generally feeling really well.  His last EGD was in September at which time multiple bands were placed.  We discussed his history of cirrhosis, risks for further decompensation and HCC.  He understands this and has been abstinent from alcohol and doing a great job with this in recent months.  Moving forward we discussed plans for further surveillance.  I am recommending another endoscopy to survey his varices and apply additional band if needed.  Hopefully with sobriety his portal pressures have reduced and varices are smaller but we can perform additional banding if needed.  I discussed risk and benefits of this with him and he wants to proceed.  We will continue nadolol at present dosing given his resting heart rate in the 60s at present dose of 20 mg a day.  He  is due for [facility] screening, will refer for right upper quadrant ultrasound.  We will check basic labs today to include CBC, INR, c-Met, AFP, and check his immunity to hepatitis A and B and vaccinate if needed.  His reflux appears well controlled on twice daily dosing Protonix, will reduce to once daily dosing  and see if he does as well at that dose.  We discussed importance of follow-up for cirrhosis in our office, we will continue to see him every 6 months, or sooner with any questions or concerns.  He agrees with the plan:  Plan: - continued alcohol abstinence - EGD to be scheduled at [facility] hospital, with possible banding - RUQ [place] for [facility] screening - labs today - CBC, INR, CMET, AFP, hepatitis A total AB, hep B surface antibody - change protonix to once daily - follow up in 6 months  McAlmont Cellar, MD [facility]  CC: Camillia Herter, NP

## 2020-08-16 LAB — HEPATITIS A ANTIBODY, TOTAL: Hepatitis A AB,Total: REACTIVE — AB

## 2020-08-16 LAB — AFP TUMOR MARKER: AFP-Tumor Marker: 3.1 ng/mL (ref <6.1)

## 2020-08-16 LAB — HEPATITIS B SURFACE ANTIBODY,QUALITATIVE: Hep B S Ab: REACTIVE — AB

## 2020-08-16 MED FILL — Nadolol Tab 20 MG: ORAL | 30 days supply | Qty: 30 | Fill #0 | Status: AC

## 2020-08-17 ENCOUNTER — Other Ambulatory Visit: Payer: Self-pay

## 2020-08-20 ENCOUNTER — Other Ambulatory Visit: Payer: Self-pay

## 2020-08-22 ENCOUNTER — Ambulatory Visit (HOSPITAL_COMMUNITY)
Admission: RE | Admit: 2020-08-22 | Discharge: 2020-08-22 | Disposition: A | Discharge status: Home / Self Care | Payer: Medicare Other | Source: Ambulatory Visit | Attending: Gastroenterology | Admitting: Gastroenterology

## 2020-08-22 ENCOUNTER — Other Ambulatory Visit: Payer: Self-pay

## 2020-08-22 DIAGNOSIS — K7290 Hepatic failure, unspecified without coma: Secondary | ICD-10-CM | POA: Insufficient documentation

## 2020-08-22 DIAGNOSIS — K7030 Alcoholic cirrhosis of liver without ascites: Secondary | ICD-10-CM | POA: Diagnosis present

## 2020-08-22 DIAGNOSIS — I8501 Esophageal varices with bleeding: Secondary | ICD-10-CM | POA: Insufficient documentation

## 2020-08-22 DIAGNOSIS — K7682 Hepatic encephalopathy: Secondary | ICD-10-CM

## 2020-08-22 DIAGNOSIS — K219 Gastro-esophageal reflux disease without esophagitis: Secondary | ICD-10-CM

## 2020-08-22 DIAGNOSIS — I8511 Secondary esophageal varices with bleeding: Secondary | ICD-10-CM | POA: Diagnosis present

## 2020-08-27 ENCOUNTER — Other Ambulatory Visit: Payer: Self-pay

## 2020-08-27 DIAGNOSIS — I8501 Esophageal varices with bleeding: Secondary | ICD-10-CM

## 2020-08-27 DIAGNOSIS — K7030 Alcoholic cirrhosis of liver without ascites: Secondary | ICD-10-CM

## 2020-08-27 DIAGNOSIS — G9340 Encephalopathy, unspecified: Secondary | ICD-10-CM

## 2020-08-27 MED ORDER — LACTULOSE ENCEPHALOPATHY 10 GM/15ML PO SOLN: 15.0000 g | Freq: Two times a day (BID) | ORAL | 5 refills | Status: DC

## 2020-08-27 NOTE — Progress Notes (Signed)
Refill requested from patient.  Seen in the office on 08-15-20

## 2020-09-05 NOTE — Progress Notes (Signed)
Attempted to obtain medical history via telephone, unable to reach at this time. I left a voicemail to return pre surgical testing department's phone call.  

## 2020-09-06 ENCOUNTER — Other Ambulatory Visit (HOSPITAL_COMMUNITY)
Admission: RE | Admit: 2020-09-06 | Discharge: 2020-09-06 | Disposition: A | Discharge status: Home / Self Care | Payer: Medicare Other | Source: Ambulatory Visit | Attending: Gastroenterology | Admitting: Gastroenterology

## 2020-09-06 DIAGNOSIS — Z01812 Encounter for preprocedural laboratory examination: Secondary | ICD-10-CM | POA: Insufficient documentation

## 2020-09-06 DIAGNOSIS — Z20822 Contact with and (suspected) exposure to covid-19: Secondary | ICD-10-CM | POA: Diagnosis not present

## 2020-09-06 LAB — SARS CORONAVIRUS 2 (TAT 6-24 HRS): SARS Coronavirus 2: NEGATIVE

## 2020-09-09 NOTE — Anesthesia Preprocedure Evaluation (Addendum)
Anesthesia Evaluation  Patient identified by MRN, date of birth, ID band Patient awake    Reviewed: Allergy & Precautions, NPO status , Patient's Chart, lab work & pertinent test results, reviewed documented beta blocker date and time   Airway Mallampati: II  TM Distance: >3 FB Neck ROM: Full    Dental  (+) Chipped, Dental Advisory Given,    Pulmonary neg pulmonary ROS, former smoker,    Pulmonary exam normal breath sounds clear to auscultation       Cardiovascular negative cardio ROS Normal cardiovascular exam Rhythm:Regular Rate:Normal     Neuro/Psych Seizures - (in setting of etoh withdrawal),  PSYCHIATRIC DISORDERS Anxiety Depression    GI/Hepatic GERD  Controlled and Medicated,(+) Cirrhosis  (mutliple GIB and banding in past)  Esophageal Varices  substance abuse  alcohol use and marijuana use,   Endo/Other  negative endocrine ROS  Renal/GU negative Renal ROS  negative genitourinary   Musculoskeletal negative musculoskeletal ROS (+)   Abdominal   Peds  Hematology negative hematology ROS (+)   Anesthesia Other Findings   Reproductive/Obstetrics                           Anesthesia Physical Anesthesia Plan  ASA: III  Anesthesia Plan: MAC   Post-op Pain Management:    Induction: Intravenous  PONV Risk Score and Plan: Propofol infusion and Treatment may vary due to age or medical condition  Airway Management Planned: Natural Airway  Additional Equipment:   Intra-op Plan:   Post-operative Plan:   Informed Consent: I have reviewed the patients History and Physical, chart, labs and discussed the procedure including the risks, benefits and alternatives for the proposed anesthesia with the patient or authorized representative who has indicated his/her understanding and acceptance.     Dental advisory given  Plan Discussed with: CRNA  Anesthesia Plan Comments:          Anesthesia Quick Evaluation

## 2020-09-10 ENCOUNTER — Ambulatory Visit (HOSPITAL_COMMUNITY): Payer: Medicare Other | Admitting: Anesthesiology

## 2020-09-10 ENCOUNTER — Encounter (HOSPITAL_COMMUNITY): Payer: Self-pay | Admitting: Gastroenterology

## 2020-09-10 ENCOUNTER — Other Ambulatory Visit: Payer: Self-pay

## 2020-09-10 ENCOUNTER — Encounter (HOSPITAL_COMMUNITY)
Admission: RE | Disposition: A | Discharge status: Home / Self Care | Payer: Self-pay | Source: Ambulatory Visit | Attending: Gastroenterology

## 2020-09-10 ENCOUNTER — Ambulatory Visit (HOSPITAL_COMMUNITY)
Admission: RE | Admit: 2020-09-10 | Discharge: 2020-09-10 | Disposition: A | Discharge status: Home / Self Care | Payer: Medicare Other | Source: Ambulatory Visit | Attending: Gastroenterology | Admitting: Gastroenterology

## 2020-09-10 DIAGNOSIS — Z87891 Personal history of nicotine dependence: Secondary | ICD-10-CM | POA: Insufficient documentation

## 2020-09-10 DIAGNOSIS — Z8379 Family history of other diseases of the digestive system: Secondary | ICD-10-CM | POA: Insufficient documentation

## 2020-09-10 DIAGNOSIS — K219 Gastro-esophageal reflux disease without esophagitis: Secondary | ICD-10-CM | POA: Insufficient documentation

## 2020-09-10 DIAGNOSIS — I8510 Secondary esophageal varices without bleeding: Secondary | ICD-10-CM | POA: Insufficient documentation

## 2020-09-10 DIAGNOSIS — K7290 Hepatic failure, unspecified without coma: Secondary | ICD-10-CM | POA: Diagnosis not present

## 2020-09-10 DIAGNOSIS — K3189 Other diseases of stomach and duodenum: Secondary | ICD-10-CM | POA: Diagnosis not present

## 2020-09-10 DIAGNOSIS — Z560 Unemployment, unspecified: Secondary | ICD-10-CM | POA: Diagnosis not present

## 2020-09-10 DIAGNOSIS — Z880 Allergy status to penicillin: Secondary | ICD-10-CM | POA: Insufficient documentation

## 2020-09-10 DIAGNOSIS — K7030 Alcoholic cirrhosis of liver without ascites: Secondary | ICD-10-CM | POA: Insufficient documentation

## 2020-09-10 DIAGNOSIS — Z79899 Other long term (current) drug therapy: Secondary | ICD-10-CM | POA: Insufficient documentation

## 2020-09-10 DIAGNOSIS — I8511 Secondary esophageal varices with bleeding: Secondary | ICD-10-CM | POA: Diagnosis not present

## 2020-09-10 DIAGNOSIS — K766 Portal hypertension: Secondary | ICD-10-CM | POA: Insufficient documentation

## 2020-09-10 HISTORY — PX: ESOPHAGEAL BANDING: SHX5518

## 2020-09-10 HISTORY — PX: ESOPHAGOGASTRODUODENOSCOPY (EGD) WITH PROPOFOL: SHX5813

## 2020-09-10 SURGERY — ESOPHAGOGASTRODUODENOSCOPY (EGD) WITH PROPOFOL
Anesthesia: Monitor Anesthesia Care

## 2020-09-10 MED ORDER — PROPOFOL 500 MG/50ML IV EMUL
INTRAVENOUS | Status: DC | PRN
  Administered 2020-09-10: 200 ug/kg/min via INTRAVENOUS

## 2020-09-10 MED ORDER — LIDOCAINE 2% (20 MG/ML) 5 ML SYRINGE
INTRAMUSCULAR | Status: DC | PRN
  Administered 2020-09-10: 40 mg via INTRAVENOUS

## 2020-09-10 MED ORDER — GLYCOPYRROLATE 0.2 MG/ML IJ SOLN
INTRAMUSCULAR | Status: DC | PRN
  Administered 2020-09-10: .1 mg via INTRAVENOUS

## 2020-09-10 MED ORDER — LACTATED RINGERS IV SOLN
INTRAVENOUS | Status: DC
  Administered 2020-09-10: 1000 mL via INTRAVENOUS

## 2020-09-10 MED ORDER — PROPOFOL 10 MG/ML IV BOLUS
INTRAVENOUS | Status: DC | PRN
  Administered 2020-09-10: 70 mg via INTRAVENOUS
  Administered 2020-09-10: 20 mg via INTRAVENOUS
  Administered 2020-09-10: 30 mg via INTRAVENOUS

## 2020-09-10 SURGICAL SUPPLY — 14 items

## 2020-09-10 NOTE — Anesthesia Postprocedure Evaluation (Signed)
Anesthesia Post Note  Patient: [name] [facility]  Procedure(s) Performed: ESOPHAGOGASTRODUODENOSCOPY (EGD) WITH PROPOFOL (N/A ) ESOPHAGEAL BANDING (N/A )     Patient location during evaluation: Endoscopy Anesthesia Type: MAC Level of consciousness: awake and alert Pain management: pain level controlled Vital Signs Assessment: post-procedure vital signs reviewed and stable Respiratory status: spontaneous breathing, nonlabored ventilation, respiratory function stable and patient connected to nasal cannula oxygen Cardiovascular status: blood pressure returned to baseline and stable Postop Assessment: no apparent nausea or vomiting Anesthetic complications: no   No complications documented.  Last Vitals:  Vitals:   09/10/20 0940 09/10/20 0950  BP: 126/81 129/76  Pulse: (!) 53 60  Resp: 12 12  Temp:    SpO2: 100% 99%    Last Pain:  Vitals:   09/10/20 0950  TempSrc:   PainSc: 6                  Helmer Dull L Anthonymichael Munday

## 2020-09-10 NOTE — Discharge Instructions (Signed)

## 2020-09-10 NOTE — Op Note (Signed)
[facility] Patient Name: [name] Procedure Date: 09/10/2020 MRN: [id number] Attending MD: Carlota Raspberry. Havery Moros , MD Date of Birth: 15-Feb-1981 CSN: [id number] Age: 40 Admit Type: Outpatient Procedure:                Upper GI endoscopy Indications:              For therapy of esophageal varices - history of                            bleeding varices in 2021, previously banded per                            [facility], last done in Sept 2021, here for repeat                            EGD with banding until eradication is achieved Providers:                Remo Lipps P. Havery Moros, MD, Brooke Person, Elspeth Cho Tech., Technician, Clearnce Sorrel, CRNA Referring MD:              Medicines:                Monitored Anesthesia Care Complications:            No immediate complications. Estimated blood loss:                            Minimal. Estimated Blood Loss:     Estimated blood loss was minimal. Procedure:                Pre-Anesthesia Assessment:                           - Prior to the procedure, a History and Physical                            was performed, and patient medications and                            allergies were reviewed. The patient's tolerance of                            previous anesthesia was also reviewed. The risks                            and benefits of the procedure and the sedation                            options and risks were discussed with the patient.                            All questions were answered, and informed consent  was obtained. Prior Anticoagulants: The patient has                            taken no previous anticoagulant or antiplatelet                            agents. ASA Grade Assessment: III - A patient with                            severe systemic disease. After reviewing the risks                            and benefits, the patient was deemed in                             satisfactory condition to undergo the procedure.                           After obtaining informed consent, the endoscope was                            passed under direct vision. Throughout the                            procedure, the patient's blood pressure, pulse, and                            oxygen saturations were monitored continuously. The                            GIF-H190 ([id number]) Olympus gastroscope was                            introduced through the mouth, and advanced to the                            second part of duodenum. The upper GI endoscopy was                            accomplished without difficulty. The patient                            tolerated the procedure well. Scope In: Scope Out: Findings:      Esophagogastric landmarks were identified: the Z-line was found at 36       cm, the gastroesophageal junction was found at 36 cm and the upper       extent of the gastric folds was found at 36 cm from the incisors.      Large varices were found in the middle third of the esophagus and in the       lower third of the esophagus from 36cm from the incisors to 20cm from       the incisors. No high risk stigmata appreciated. Starting distally and       moving proximally, seven bands were successfully  placed resulting in       deflation of varices.      The exam of the esophagus was otherwise normal.      Diffuse mild portal hypertensive gastropathy was found in the gastric       fundus and in the gastric body.      The exam of the stomach was otherwise normal.      The duodenal bulb and second portion of the duodenum were normal. Impression:               - Esophagogastric landmarks identified.                           - Large esophageal varices in the mid to lower                            esophagus as outlined. Banded x 7 with good result.                           - Mild portal hypertensive gastropathy.                           -  Normal stomach otherwise                           - Normal duodenal bulb and second portion of the                            duodenum. Moderate Sedation:      No moderate sedation, case performed with MAC Recommendation:           - Patient has a contact number available for                            emergencies. The signs and symptoms of potential                            delayed complications were discussed with the                            patient. Return to normal activities tomorrow.                            Written discharge instructions were provided to the                            patient.                           - Advance diet as tolerated - start with liquids                            today, soft tonight, etc                           - Continue present medications including protonix  and nadolol                           - Repeat upper endoscopy in 1-2 months for                            retreatment as needed, our office will call to                            coordinate. Procedure Code(s):        --- Professional ---                           [id number], Esophagogastroduodenoscopy, flexible,                            transoral; with band ligation of esophageal/gastric                            varices Diagnosis Code(s):        --- Professional ---                           I85.00, Esophageal varices without bleeding                           K76.6, Portal hypertension                           K31.89, Other diseases of stomach and duodenum CPT copyright 2019 American Medical Association. All rights reserved. The codes documented in this report are preliminary and upon coder review may  be revised to meet current compliance requirements. Remo Lipps P. Havery Moros, MD 09/10/2020 9:26:21 AM This report has been signed electronically. Number of Addenda: 0

## 2020-09-10 NOTE — Transfer of Care (Signed)
Immediate Anesthesia Transfer of Care Note  Patient: [name] [facility]  Procedure(s) Performed: ESOPHAGOGASTRODUODENOSCOPY (EGD) WITH PROPOFOL (N/A ) ESOPHAGEAL BANDING (N/A )  Patient Location: PACU  Anesthesia Type:MAC  Level of Consciousness: awake, alert , oriented and patient cooperative  Airway & Oxygen Therapy: Patient Spontanous Breathing and Patient connected to nasal cannula oxygen  Post-op Assessment: Report given to RN and Post -op Vital signs reviewed and stable  Post vital signs: Reviewed and stable  Last Vitals:  Vitals Value Taken Time  BP    Temp    Pulse 71 09/10/20 0922  Resp 22 09/10/20 0922  SpO2 100 % 09/10/20 0922  Vitals shown include unvalidated device data.  Last Pain:  Vitals:   09/10/20 0806  TempSrc: Oral  PainSc: 0-No pain         Complications: No complications documented.

## 2020-09-10 NOTE — Interval H&P Note (Signed)
History and Physical Interval Note: Patient feeling well without complaints, no interval changes since I have last seen him. Labs up to date and improved from previous, he has been abstinent from alcohol. Have discussed EGD and banding of varices with him, risks / benefits, he wishes to proceed. Further recommendations pending the results. All questions answered.   09/10/2020 8:27 AM  [name]  has presented today for surgery, with the diagnosis of alcoholic cirrhosis, esophageal varices, hepatic enceph.  The various methods of treatment have been discussed with the patient and family. After consideration of risks, benefits and other options for treatment, the patient has consented to  Procedure(s): ESOPHAGOGASTRODUODENOSCOPY (EGD) WITH PROPOFOL (N/A) ESOPHAGEAL BANDING (N/A) as a surgical intervention.  The patient's history has been reviewed, patient examined, no change in status, stable for surgery.  I have reviewed the patient's chart and labs.  Questions were answered to the patient's satisfaction.     Viviann Spare P [place] Kesinger

## 2020-09-11 ENCOUNTER — Encounter (HOSPITAL_COMMUNITY): Payer: Self-pay | Admitting: Gastroenterology

## 2020-09-18 ENCOUNTER — Other Ambulatory Visit: Payer: Self-pay

## 2020-09-18 MED ORDER — NADOLOL 20 MG PO TABS: ORAL_TABLET | Freq: Every day | ORAL | 1 refills | Status: DC

## 2020-09-18 NOTE — Progress Notes (Signed)
Refill of nadolol sent to pharmacy

## 2020-09-28 ENCOUNTER — Other Ambulatory Visit: Payer: Self-pay | Admitting: Family

## 2020-09-28 DIAGNOSIS — F419 Anxiety disorder, unspecified: Secondary | ICD-10-CM

## 2020-09-28 DIAGNOSIS — F32A Depression, unspecified: Secondary | ICD-10-CM

## 2020-09-28 NOTE — Telephone Encounter (Signed)
Last visit 2/21/20222. Please schedule appointment for additional refills.

## 2020-10-04 NOTE — Telephone Encounter (Signed)
Called pt and he stated he will go to the [facility] today.

## 2020-10-04 NOTE — Telephone Encounter (Signed)
Last visit 06/18/2020. [facility] 30 day supply Sertraline (Zoloft) refilled. Please schedule office appointment for additional refills.

## 2020-10-04 NOTE — Telephone Encounter (Signed)
Pt called back to make appointment. Pt asked if enough pills could be called in until his appointment on October 16, 2020.  1) Medication(s) Requested (by name): sertraline (ZOLOFT) 25 MG table   2) Pharmacy of Choice: Pharmacy  [id number] [place], [place] - [address] AT [facility] [address]  [address], [place] [address]  Phone:  [phone]  Fax:  [phone]  DEA #:  [id number]     3) Special Requests:   Approved medications will be sent to the pharmacy, we will reach out if there is an issue.  Requests made after 3pm may not be addressed until the following business day!  If a patient is unsure of the name of the medication(s) please note and ask patient to call back when they are able to provide all info, do not send to responsible party until all information is available!

## 2020-10-09 ENCOUNTER — Other Ambulatory Visit: Payer: Self-pay

## 2020-10-09 DIAGNOSIS — G9340 Encephalopathy, unspecified: Secondary | ICD-10-CM

## 2020-10-09 DIAGNOSIS — K7030 Alcoholic cirrhosis of liver without ascites: Secondary | ICD-10-CM

## 2020-10-09 DIAGNOSIS — I8501 Esophageal varices with bleeding: Secondary | ICD-10-CM

## 2020-10-09 MED ORDER — LACTULOSE ENCEPHALOPATHY 10 GM/15ML PO SOLN: 15.0000 g | Freq: Two times a day (BID) | ORAL | 2 refills | Status: DC

## 2020-10-15 NOTE — Progress Notes (Signed)
Patient ID: [name], male    DOB: [birth year]  MRN: [id number]  CC: Depression and Anxiety Follow-Up  Subjective: [name] is a 40 y.o. male who presents for depression and follow-up.   His concerns today include:   Depression and anxiety follow-up: 06/18/2020: - Doing well on current regimen. - Continue Sertraline as prescribed. - Reports he has not been to Psychiatry referral since last visit. Says he may reconsider going some time in the future but feeling well for now. - Follow-up with primary provider in 3 months or sooner if needed.   10/16/2020: Doing well on current regimen. No issue/concerns. Denies thoughts of self-harm, suicidal ideations, and homicidal ideations.   Depression screen [facility] 2/9 10/16/2020 06/18/2020 05/16/2020 12/22/2019  Decreased Interest 0 1 3 0  Down, Depressed, Hopeless 1 0 3 2  PHQ - 2 Score 1 1 6 2   Altered sleeping 0 2 0 0  Tired, decreased energy 0 0 0 1  Change in appetite 0 2 1 3   Feeling bad or failure about yourself  1 1 3 1   Trouble concentrating 1 0 3 1  Moving slowly or fidgety/restless 1 0 1 2  Suicidal thoughts 0 0 1 0  PHQ-9 Score 4 6 15 10   Difficult doing work/chores Not difficult at all - - -    Patient Active Problem List   Diagnosis Date Noted   Alcoholic hepatitis 05/16/2020   Anemia 05/16/2020   Encephalopathy, portal systemic (HCC) 05/16/2020   Anxiety 05/16/2020   Gynecomastia 05/16/2020   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/16/2020   Esophageal varices with bleeding (HCC) 05/16/2020   Encephalopathy 05/16/2020   GI bleed 10/09/2019   Hematemesis 10/09/2019   Leukocytosis 10/09/2019   Elevated liver enzymes 10/09/2019   Elevated lactic acid level 09/02/2019   Bleeding esophageal varices (HCC) 09/02/2019   Shock (HCC) 08/30/2019   Electrolyte disturbance 07/16/2019   Lactic acidosis 07/16/2019   Hyperammonemia (HCC) 07/16/2019   Hyperbilirubinemia  07/16/2019   Alcoholic cirrhosis of liver without ascites (HCC) 07/24/2016   Acute encephalopathy    Acute upper GI bleed    Ascites    Distended abdomen    Hemorrhagic shock (HCC)    Encounter for palliative care    Goals of care, counseling/discussion    Encephalopathy acute 08/27/2015   Alcoholic cirrhosis (HCC)    Acute hypoxemic respiratory failure (HCC)    SBP (spontaneous bacterial peritonitis) (HCC)    Hypocalcemia 08/21/2015   Hypomagnesemia 08/21/2015   Acute blood loss anemia 08/20/2015   Alcohol abuse 08/20/2015   Elevated INR 08/20/2015   Hypoalbuminemia 08/20/2015   Hypokalemia 08/20/2015     Current Outpatient Medications on File Prior to Visit  Medication Sig Dispense Refill   Ferrous Sulfate (IRON) 325 (65 Fe) MG TABS Take 1 tablet (325 mg total) by mouth daily. 20 tablet 0   folic acid (FOLVITE) 400 MCG tablet Take 400 mcg by mouth daily.     hydrocortisone cream 1 % Apply 1 application topically daily as needed for itching.     lactulose (CHRONULAC) 10 GM/15ML solution SMARTSIG:Milliliter(s) By Mouth     lactulose, encephalopathy, (CHRONULAC) 10 GM/15ML SOLN Take 22.5 mLs (15 g total) by mouth in the morning and at bedtime. 1892 mL 2   Magnesium 500 MG TABS Take 500 mg by mouth in the morning and at bedtime.     Multiple Vitamin (MULTIVITAMIN WITH MINERALS) TABS tablet Take 1 tablet by  mouth daily. (Patient taking differently: Take 1 tablet by mouth daily with supper.)     nadolol (CORGARD) 20 MG tablet TAKE 1 TABLET (20 MG TOTAL) BY MOUTH DAILY. 90 tablet 1   pantoprazole (PROTONIX) 40 MG tablet Take 1 tablet (40 mg total) by mouth daily. 30 tablet 3   thiamine (VITAMIN B-1) 100 MG tablet Take 100 mg by mouth daily.     No current facility-administered medications on file prior to visit.    Allergies  Allergen Reactions   Penicillins Hives    Infant    Social History   Socioeconomic History   Marital status: Single    Spouse name: Not on file    Number of children: Not on file   Years of education: Not on file   Highest education level: Not on file  Occupational History   Not on file  Tobacco Use   Smoking status: Former    Packs/day: 1.00    Pack years: 0.00    Types: Cigarettes   Smokeless tobacco: Never  Vaping Use   Vaping Use: Never used  Substance and Sexual Activity   Alcohol use: Not Currently    Alcohol/week: 3.0 standard drinks    Types: 3 Standard drinks or equivalent per week    Comment: Alcoholism   Drug use: Yes    Frequency: 7.0 times per week    Types: Marijuana   Sexual activity: Not Currently  Other Topics Concern   Not on file  Social History Narrative   Not on file   Social Determinants of Health   Financial Resource Strain: Not on file  Food Insecurity: Not on file  Transportation Needs: Not on file  Physical Activity: Not on file  Stress: Not on file  Social Connections: Not on file  Intimate Partner Violence: Not on file    Family History  Problem Relation Age of Onset   Peptic Ulcer Paternal Aunt    Crohn's disease Maternal Uncle    Heart attack Maternal Uncle    Heart attack Maternal Grandfather    Stroke Maternal Grandmother    Stroke Paternal Grandfather     Past Surgical History:  Procedure Laterality Date   ESOPHAGEAL BANDING  08/30/2019   Procedure: ESOPHAGEAL BANDING;  Surgeon: Kerin Salen, MD;  Location: [facility] ENDOSCOPY;  Service: Gastroenterology;;   ESOPHAGEAL BANDING  10/10/2019   Procedure: ESOPHAGEAL BANDING;  Surgeon: Kerin Salen, MD;  Location: [facility] ENDOSCOPY;  Service: Gastroenterology;;   ESOPHAGEAL BANDING N/A 12/23/2019   Procedure: ESOPHAGEAL BANDING;  Surgeon: Kathi Der, MD;  Location: [facility] ENDOSCOPY;  Service: Gastroenterology;  Laterality: N/A;   ESOPHAGEAL BANDING N/A 01/12/2020   Procedure: ESOPHAGEAL BANDING;  Surgeon: Kathi Der, MD;  Location: [facility] ENDOSCOPY;  Service: Gastroenterology;  Laterality: N/A;   ESOPHAGEAL BANDING N/A 09/10/2020    Procedure: ESOPHAGEAL BANDING;  Surgeon: Benancio Deeds, MD;  Location: [facility] ENDOSCOPY;  Service: Gastroenterology;  Laterality: N/A;   ESOPHAGOGASTRODUODENOSCOPY Left 08/21/2015   Procedure: ESOPHAGOGASTRODUODENOSCOPY (EGD);  Surgeon: Charlott Rakes, MD;  Location: [facility] ENDOSCOPY;  Service: Endoscopy;  Laterality: Left;   ESOPHAGOGASTRODUODENOSCOPY N/A 12/05/2019   Procedure: ESOPHAGOGASTRODUODENOSCOPY (EGD);  Surgeon: Kathi Der, MD;  Location: [facility] ENDOSCOPY;  Service: Gastroenterology;  Laterality: N/A;   ESOPHAGOGASTRODUODENOSCOPY (EGD) WITH PROPOFOL N/A 08/30/2019   Procedure: ESOPHAGOGASTRODUODENOSCOPY (EGD) WITH PROPOFOL;  Surgeon: Kerin Salen, MD;  Location: [facility] ENDOSCOPY;  Service: Gastroenterology;  Laterality: N/A;   ESOPHAGOGASTRODUODENOSCOPY (EGD) WITH PROPOFOL N/A 10/10/2019   Procedure: ESOPHAGOGASTRODUODENOSCOPY (EGD) WITH PROPOFOL;  Surgeon: Kerin Salen,  MD;  Location: [facility] ENDOSCOPY;  Service: Gastroenterology;  Laterality: N/A;   ESOPHAGOGASTRODUODENOSCOPY (EGD) WITH PROPOFOL N/A 12/23/2019   Procedure: ESOPHAGOGASTRODUODENOSCOPY (EGD) WITH PROPOFOL;  Surgeon: Kathi Der, MD;  Location: [facility] ENDOSCOPY;  Service: Gastroenterology;  Laterality: N/A;   ESOPHAGOGASTRODUODENOSCOPY (EGD) WITH PROPOFOL N/A 01/12/2020   Procedure: ESOPHAGOGASTRODUODENOSCOPY (EGD) WITH PROPOFOL;  Surgeon: Kathi Der, MD;  Location: [facility] ENDOSCOPY;  Service: Gastroenterology;  Laterality: N/A;   ESOPHAGOGASTRODUODENOSCOPY (EGD) WITH PROPOFOL N/A 09/10/2020   Procedure: ESOPHAGOGASTRODUODENOSCOPY (EGD) WITH PROPOFOL;  Surgeon: Benancio Deeds, MD;  Location: [facility] ENDOSCOPY;  Service: Gastroenterology;  Laterality: N/A;   GASTRIC VARICES BANDING  12/05/2019   Procedure: GASTRIC VARICES BANDING;  Surgeon: Kathi Der, MD;  Location: [facility] ENDOSCOPY;  Service: Gastroenterology;;  placed 6 bands   HOT HEMOSTASIS N/A 12/05/2019   Procedure: HOT HEMOSTASIS (ARGON PLASMA COAGULATION/BICAP);  Surgeon:  Kathi Der, MD;  Location: [facility] ENDOSCOPY;  Service: Gastroenterology;  Laterality: N/A;   IR RADIOLOGIST EVAL & MGMT  12/13/2019    ROS: Review of Systems Negative except as stated above  PHYSICAL EXAM: BP 113/76 (BP Location: Left Arm, Patient Position: Sitting, Cuff Size: Normal)   Pulse 74   Temp 98.1 F (36.7 C)   Resp 16   Ht 5' 7.01" (1.702 m)   Wt 136 lb 12.8 oz (62.1 kg)   SpO2 96%   BMI 21.42 kg/m   Physical Exam General appearance - alert, well appearing, and in no distress and oriented to person, place, and time Mental status - alert, oriented to person, place, and time, normal mood, behavior, speech, dress, motor activity, and thought processes Chest - clear to auscultation, no wheezes, rales or rhonchi, symmetric air entry, no tachypnea, retractions or cyanosis Heart - normal rate, regular rhythm, normal S1, S2, no murmurs, rubs, clicks or gallops Neurological - alert, oriented, normal speech, no focal findings or movement disorder noted   ASSESSMENT AND PLAN: 1. Depression, unspecified depression type: 2. Anxiety: - Patient stable. Denies thoughts of self-harm, suicidal ideations, and homicidal ideations.  - Continue Sertraline as prescribed.  - Follow-up with primary provider in 3 months or sooner if needed.  - sertraline (ZOLOFT) 25 MG tablet; TAKE 1 TABLET(25 MG) BY MOUTH DAILY  Dispense: 90 tablet; Refill: 0    Patient was given the opportunity to ask questions.  Patient verbalized understanding of the plan and was able to repeat key elements of the plan. Patient was given clear instructions to go to Emergency Department or return to medical center if symptoms don't improve, worsen, or new problems develop.The patient verbalized understanding.   Requested Prescriptions   Signed Prescriptions Disp Refills   sertraline (ZOLOFT) 25 MG tablet 90 tablet 0    Sig: TAKE 1 TABLET(25 MG) BY MOUTH DAILY    Return in about 3 months (around 01/16/2021) for  Follow-Up depression and anxiety .  Rema Fendt, NP

## 2020-10-16 ENCOUNTER — Other Ambulatory Visit: Payer: Self-pay

## 2020-10-16 ENCOUNTER — Encounter: Payer: Self-pay | Admitting: Family

## 2020-10-16 ENCOUNTER — Ambulatory Visit (INDEPENDENT_AMBULATORY_CARE_PROVIDER_SITE_OTHER): Payer: Medicare Other | Admitting: Family

## 2020-10-16 VITALS — BP 113/76 | HR 74 | Temp 98.1°F | Resp 16 | Ht 67.01 in | Wt 136.8 lb

## 2020-10-16 DIAGNOSIS — F419 Anxiety disorder, unspecified: Secondary | ICD-10-CM | POA: Diagnosis not present

## 2020-10-16 DIAGNOSIS — F32A Depression, unspecified: Secondary | ICD-10-CM

## 2020-10-16 MED ORDER — SERTRALINE HCL 25 MG PO TABS: ORAL_TABLET | ORAL | 0 refills | Status: DC

## 2020-10-16 NOTE — Progress Notes (Signed)
Pt presents for Sertraline refill

## 2020-10-18 ENCOUNTER — Encounter: Payer: Self-pay | Admitting: Family

## 2020-10-18 ENCOUNTER — Other Ambulatory Visit: Payer: Self-pay | Admitting: Family

## 2020-10-18 DIAGNOSIS — I8501 Esophageal varices with bleeding: Secondary | ICD-10-CM

## 2021-01-24 ENCOUNTER — Other Ambulatory Visit: Payer: Self-pay

## 2021-01-24 DIAGNOSIS — G9340 Encephalopathy, unspecified: Secondary | ICD-10-CM

## 2021-01-24 DIAGNOSIS — I8501 Esophageal varices with bleeding: Secondary | ICD-10-CM

## 2021-01-24 DIAGNOSIS — K7030 Alcoholic cirrhosis of liver without ascites: Secondary | ICD-10-CM

## 2021-01-24 MED ORDER — LACTULOSE ENCEPHALOPATHY 10 GM/15ML PO SOLN: 15.0000 g | Freq: Two times a day (BID) | ORAL | 2 refills | Status: DC

## 2021-01-29 ENCOUNTER — Other Ambulatory Visit: Payer: Self-pay | Admitting: Family

## 2021-01-29 DIAGNOSIS — F32A Depression, unspecified: Secondary | ICD-10-CM

## 2021-01-29 DIAGNOSIS — F419 Anxiety disorder, unspecified: Secondary | ICD-10-CM

## 2021-01-30 ENCOUNTER — Other Ambulatory Visit: Payer: Self-pay | Admitting: Family

## 2021-01-30 ENCOUNTER — Telehealth: Payer: Self-pay

## 2021-01-30 ENCOUNTER — Encounter: Payer: Self-pay | Admitting: Family

## 2021-01-30 DIAGNOSIS — K7030 Alcoholic cirrhosis of liver without ascites: Secondary | ICD-10-CM

## 2021-01-30 DIAGNOSIS — F32A Depression, unspecified: Secondary | ICD-10-CM

## 2021-01-30 DIAGNOSIS — F419 Anxiety disorder, unspecified: Secondary | ICD-10-CM

## 2021-01-30 MED ORDER — SERTRALINE HCL 25 MG PO TABS: ORAL_TABLET | ORAL | 0 refills | Status: DC

## 2021-01-30 NOTE — Telephone Encounter (Signed)
-----   Message from Cooper Render, CMA sent at 08/23/2020  2:10 PM EDT ----- Regarding: due for U/S Due for RUQ ultrasound for hepatic cirrhosis

## 2021-01-30 NOTE — Telephone Encounter (Signed)
Patient due for RUQ ultrasound for HCC screening. He has been Scheduled for Monday 10-24 at 9:00am to arrive at 8:45am. NPO 6 hours. MyChart message sent to patient with appointment information

## 2021-01-31 NOTE — Telephone Encounter (Signed)
The requested Sertraline (Zoloft) was refilled on 01/30/2021 at [facility] by me. Patient was also sent a MyChart message regarding the same.

## 2021-02-07 NOTE — Telephone Encounter (Signed)
Magda Paganini,   Please call patient to confirm if he was able to pickup Sertraline (Zoloft) from the pharmacy. I refilled the same on 01/30/2021.  Ricky Stabs, NP  Family Medicine  Primary Care at [facility] [phone]

## 2021-02-18 ENCOUNTER — Ambulatory Visit (HOSPITAL_COMMUNITY)
Admission: RE | Admit: 2021-02-18 | Discharge: 2021-02-18 | Disposition: A | Discharge status: Home / Self Care | Payer: Medicare Other | Source: Ambulatory Visit | Attending: Gastroenterology | Admitting: Gastroenterology

## 2021-02-18 ENCOUNTER — Other Ambulatory Visit: Payer: Self-pay

## 2021-02-18 DIAGNOSIS — K7030 Alcoholic cirrhosis of liver without ascites: Secondary | ICD-10-CM | POA: Diagnosis present

## 2021-03-12 ENCOUNTER — Encounter: Payer: Self-pay | Admitting: Gastroenterology

## 2021-03-12 ENCOUNTER — Other Ambulatory Visit (INDEPENDENT_AMBULATORY_CARE_PROVIDER_SITE_OTHER): Payer: Medicare Other

## 2021-03-12 ENCOUNTER — Ambulatory Visit (INDEPENDENT_AMBULATORY_CARE_PROVIDER_SITE_OTHER): Payer: Medicare Other | Admitting: Gastroenterology

## 2021-03-12 VITALS — BP 102/60 | HR 66 | Ht 67.0 in | Wt 145.1 lb

## 2021-03-12 DIAGNOSIS — K7682 Hepatic encephalopathy: Secondary | ICD-10-CM

## 2021-03-12 DIAGNOSIS — I8501 Esophageal varices with bleeding: Secondary | ICD-10-CM | POA: Diagnosis not present

## 2021-03-12 DIAGNOSIS — K7030 Alcoholic cirrhosis of liver without ascites: Secondary | ICD-10-CM

## 2021-03-12 DIAGNOSIS — K219 Gastro-esophageal reflux disease without esophagitis: Secondary | ICD-10-CM | POA: Diagnosis not present

## 2021-03-12 LAB — CBC WITH DIFFERENTIAL/PLATELET
Basophils Absolute: 0.1 10*3/uL (ref 0.0–0.1)
Basophils Relative: 1.4 % (ref 0.0–3.0)
Eosinophils Absolute: 0.2 10*3/uL (ref 0.0–0.7)
Eosinophils Relative: 2.3 % (ref 0.0–5.0)
HCT: 45.2 % (ref 39.0–52.0)
Hemoglobin: 15.5 g/dL (ref 13.0–17.0)
Lymphocytes Relative: 13.2 % (ref 12.0–46.0)
Lymphs Abs: 1 10*3/uL (ref 0.7–4.0)
MCHC: 34.3 g/dL (ref 30.0–36.0)
MCV: 96.9 fl (ref 78.0–100.0)
Monocytes Absolute: 1.3 10*3/uL — ABNORMAL HIGH (ref 0.1–1.0)
Monocytes Relative: 16.9 % — ABNORMAL HIGH (ref 3.0–12.0)
Neutro Abs: 5.1 10*3/uL (ref 1.4–7.7)
Neutrophils Relative %: 66.2 % (ref 43.0–77.0)
Platelets: 156 10*3/uL (ref 150.0–400.0)
RBC: 4.67 Mil/uL (ref 4.22–5.81)
RDW: 12.6 % (ref 11.5–15.5)
WBC: 7.7 10*3/uL (ref 4.0–10.5)

## 2021-03-12 LAB — PROTIME-INR
INR: 1.3 ratio — ABNORMAL HIGH (ref 0.8–1.0)
Prothrombin Time: 13.9 s — ABNORMAL HIGH (ref 9.6–13.1)

## 2021-03-12 LAB — COMPREHENSIVE METABOLIC PANEL
ALT: 17 U/L (ref 0–53)
AST: 30 U/L (ref 0–37)
Albumin: 4.2 g/dL (ref 3.5–5.2)
Alkaline Phosphatase: 56 U/L (ref 39–117)
BUN: 13 mg/dL (ref 6–23)
CO2: 33 mEq/L — ABNORMAL HIGH (ref 19–32)
Calcium: 9.4 mg/dL (ref 8.4–10.5)
Chloride: 103 mEq/L (ref 96–112)
Creatinine, Ser: 0.91 mg/dL (ref 0.40–1.50)
GFR: 104.88 mL/min (ref >60.00)
Glucose, Bld: 101 mg/dL — ABNORMAL HIGH (ref 70–99)
Potassium: 4 mEq/L (ref 3.5–5.1)
Sodium: 141 mEq/L (ref 135–145)
Total Bilirubin: 0.9 mg/dL (ref 0.2–1.2)
Total Protein: 7.8 g/dL (ref 6.0–8.3)

## 2021-03-12 MED ORDER — PANTOPRAZOLE SODIUM 40 MG PO TBEC: 40.0000 mg | DELAYED_RELEASE_TABLET | Freq: Every day | ORAL | 1 refills | Status: DC

## 2021-03-12 MED ORDER — NADOLOL 20 MG PO TABS: ORAL_TABLET | Freq: Every day | ORAL | 3 refills | Status: DC

## 2021-03-12 NOTE — Progress Notes (Signed)
HPI :  40 y/o male with history of EtOH cirrhosis, history of variceal bleeding, history of hepatic encephalopathy, here for a follow up visit.   Recall he has a history of alcoholic cirrhosis with first variceal bleed in 2017.  Multiple admissions for variceal bleeding since that time with the last summer 2021. He has had numerous banding's in the past as well as sclerotherapy with ethanolamine.  He states he has been considered for TIPS in the past but has never had it done.  He previously drank heavily, pint of vodka per day, he has not had a drink since August 2021 and has been doing really well with sobriety since this time.  He had a period of 5 years of sobriety since his diagnosis of cirrhosis but had a relapse 2021.  He was evaluated for transplant at [facility] a few years ago but his MELD was not high enough to be listed.   He has been maintained on nadolol 20 mg a day and tolerating it well, as his resting heart rate is around 60.  He states he is compliant with it.  He had an EGD with me in May which showed large varices, 7 bands were placed, I recommended follow-up EGD in 1 to 2 months but have not seen him since that time.  He has been taking lactulose daily and that works to control his encephalopathy.  He takes Protonix 40 mg once daily and states it works really well to control his GERD.  He has been on omeprazole for years in the past which he states did not work nearly as well he had a lot of breakthrough symptoms on the regimen.  He is quite happy with Protonix.  He denies any swelling in his legs or abdomen.  He again states he has been sober since August 2021 and doing really well in this regard.  He had an ultrasound in October for [facility] screening and it was negative, stable changes of cirrhosis.  Last set of labs were in April.  He is immune to hep A and B.   RUQ [place] 02/18/21 - IMPRESSION: 1. Hepatic cirrhosis.  No focal lesion identified.   EGD 09/10/20 - Esophagogastric landmarks  identified. - Large esophageal varices in the mid to lower esophagus as outlined. Banded x 7 with good result. - Mild portal hypertensive gastropathy. - Normal stomach otherwise - Normal duodenal bulb and second portion of the duodenum.    Past Medical History:  Diagnosis Date   Anxiety    Phreesia 05/14/2020   Blood transfusion without reported diagnosis    Phreesia 05/14/2020   Cirrhosis of liver (HCC)    Depression    Phreesia 05/14/2020   Esophageal varices with hemorrhage (HCC)    ETOH abuse    GERD (gastroesophageal reflux disease)    Seizures (HCC)    Phreesia 05/14/2020   Substance abuse (HCC)    Phreesia 05/14/2020     Past Surgical History:  Procedure Laterality Date   ESOPHAGEAL BANDING  08/30/2019   Procedure: ESOPHAGEAL BANDING;  Surgeon: Kerin Salen, MD;  Location: [facility] ENDOSCOPY;  Service: Gastroenterology;;   ESOPHAGEAL BANDING  10/10/2019   Procedure: ESOPHAGEAL BANDING;  Surgeon: Kerin Salen, MD;  Location[facility]OSCOPY;  Service: Gastroenterology;;   ESOPHAGEAL BANDING N/A 12/23/2019   Procedure: ESOPHAGEAL BANDING;  Surgeon: Kathi Der, MD;  Location: [facility] ENDOSCOPY;  Service: Gastroenterology;  Laterality: N/A;   ESOPHAGEAL BANDING N/A 01/12/2020   Procedure: ESOPHAGEAL BANDING;  Surgeon: Kathi Der,  MD;  Location: [facility] ENDOSCOPY;  Service: Gastroenterology;  Laterality: N/A;   ESOPHAGEAL BANDING N/A 09/10/2020   Procedure: ESOPHAGEAL BANDING;  Surgeon: Yetta Flock, MD;  Location: [facility] ENDOSCOPY;  Service: Gastroenterology;  Laterality: N/A;   ESOPHAGOGASTRODUODENOSCOPY Left 08/21/2015   Procedure: ESOPHAGOGASTRODUODENOSCOPY (EGD);  Surgeon: Wilford Corner, MD;  Location: [facility] ENDOS[facility];  Service: Endoscopy;  Laterality: Left;   ESOPHAGOGASTRODUODENOSCOPY N/A 12/05/2019   Procedure: ESOPHAGOGASTRODUODENOSCOPY (EGD);  Surgeon: Otis Brace, MD;  Location: [facility] ENDOSCOPY;  Service: Gastroenterology;  Laterality: N/A;   ESOPHAGOGASTRODUODENOSCOPY  (EGD) WITH PROPOFOL N/A 08/30/2019   Procedure: ESOPHAGOGASTRODUODENOSCOPY (EGD) WITH PROPOFOL;  Surgeon: Ronnette Juniper, MD;  Location: [place];  Service: Gastroenterology;  Laterality: N/A;   ESOPHAGOGASTRODUODENOSCOPY (EGD) WITH PROPOFOL N/A 10/10/2019   Procedure: ESOPHAGOGASTRODUODENOSCOPY (EGD) WITH PROPOFOL;  Surgeon: Ronnette Juniper, MD;  Location: [place];  Service: Gastroenterology;  Laterality: N/A;   ESOPHAGOGASTRODUODENOSCOPY (EGD) WITH PROPOFOL N/A 12/23/2019   Procedure: ESOPHAGOGASTRODUODENOSCOPY (EGD) WITH PROPOFOL;  Surgeon: Otis Brace, MD;  Location: [facility] ENDOSCOPY;  Service: Gastroenterology;  Laterality: N/A;   ESOPHAGOGASTRODUODENOSCOPY (EGD) WITH PROPOFOL N/A 01/12/2020   Procedure: ESOPHAGOGASTRODUODENOSCOPY (EGD) WITH PROPOFOL;  Surgeon: Otis Brace, MD;  Location: [facility] ENDOSCOPY;  Service: Gastroenterology;  Laterality: N/A;   ESOPHAGOGASTRODUODENOSCOPY (EGD) WITH PROPOFOL N/A 09/10/2020   Procedure: ESOPHAGOGASTRODUODENOSCOPY (EGD) WITH PROPOFOL;  Surgeon: Yetta Flock, MD;  Location: [facility] ENDOSCOPY;  Service: Gastroenterology;  Laterality: N/A;   GASTRIC VARICES BANDING  12/05/2019   Procedure: GASTRIC VARICES BANDING;  Surgeon: Otis Brace, MD;  Location: [place];  Service: Gastroenterology;;  placed 6 bands   HOT HEMOSTASIS N/A 12/05/2019   Procedure: HOT HEMOSTASIS (ARGON PLASMA COAGULATION/BICAP);  Surgeon: Otis Brace, MD;  Location: [facility] ENDOSCOPY;  Service: Gastroenterology;  Laterality: N/A;   IR RADIOLOGIST EVAL & MGMT  12/13/2019   Family History  Problem Relation Age of Onset   Peptic Ulcer Paternal Aunt    Crohn's disease Maternal Uncle    Heart attack Maternal Uncle    Heart attack Maternal Grandfather    Stroke Maternal Grandmother    Stroke Paternal Grandfather    Social History   Tobacco Use   Smoking status: Former    Packs/day: 1.00    Types: Cigarettes   Smokeless tobacco: Never  Vaping Use   Vaping Use: Never used   Substance Use Topics   Alcohol use: Not Currently    Alcohol/week: 3.0 standard drinks    Types: 3 Standard drinks or equivalent per week    Comment: Alcoholism   Drug use: Yes    Frequency: 7.0 times per week    Types: Marijuana   Current Outpatient Medications  Medication Sig Dispense Refill   Ferrous Sulfate (IRON) 325 (65 Fe) MG TABS Take 1 tablet (325 mg total) by mouth daily. 20 tablet 0   folic acid (FOLVITE) A999333 MCG tablet Take 400 mcg by mouth daily.     hydrocortisone cream 1 % Apply 1 application topically daily as needed for itching.     lactulose, encephalopathy, (CHRONULAC) 10 GM/15ML SOLN Take 22.5 mLs (15 g total) by mouth in the morning and at bedtime. 1892 mL 2   Magnesium 500 MG TABS Take 500 mg by mouth in the morning and at bedtime.     Multiple Vitamin (MULTIVITAMIN WITH MINERALS) TABS tablet Take 1 tablet by mouth daily. (Patient taking differently: Take 1 tablet by mouth daily with supper.)     sertraline (ZOLOFT) 25 MG tablet TAKE 1 TABLET(25 MG) BY MOUTH DAILY 60 tablet 0  thiamine (VITAMIN B-1) 100 MG tablet Take 100 mg by mouth daily.     nadolol (CORGARD) 20 MG tablet TAKE 1 TABLET (20 MG TOTAL) BY MOUTH DAILY. 90 tablet 3   pantoprazole (PROTONIX) 40 MG tablet Take 1 tablet (40 mg total) by mouth daily. 90 tablet 1   No current facility-administered medications for this visit.   Allergies  Allergen Reactions   Penicillins Hives    Infant     Review of Systems: All systems reviewed and negative except where noted in HPI.    US Abdomen Limited RUQ (LIVER/GB)  Result Date: 02/18/2021 CLINICAL DATA:  HCC screening, cirrhosis. EXAM: ULTRASOUND ABDOMEN LIMITED RIGHT UPPER QUADRANT COMPARISON:  Abdominal ultrasound 08/22/2020. CTA abdomen and pelvis 12/06/2019. FINDINGS: Gallbladder: No gallstones or wall thickening visualized. No sonographic Murphy sign noted by sonographer. Common bile duct: Diameter: 2 mm. Liver: No focal lesion identified. Again  seen is nodular liver contour. The liver is enlarged. The liver is diffusely heterogeneous with normal echogenicity. Portal vein is patent on color Doppler imaging with normal direction of blood flow towards the liver. Other: None. IMPRESSION: 1. Hepatic cirrhosis.  No focal lesion identified. Electronically Signed   By: Ronney Asters M.D.   On: 02/18/2021 23:43    Lab Results  Component Value Date   WBC 7.2 08/15/2020   HGB 16.2 08/15/2020   HCT 46.6 08/15/2020   MCV 95.8 08/15/2020   PLT 156.0 08/15/2020    Lab Results  Component Value Date   CREATININE 0.77 08/15/2020   BUN 22 08/15/2020   NA 139 08/15/2020   K 4.0 08/15/2020   CL 103 08/15/2020   CO2 30 08/15/2020    Lab Results  Component Value Date   ALT 21 08/15/2020   AST 33 08/15/2020   ALKPHOS 56 08/15/2020   BILITOT 0.9 08/15/2020    Lab Results  Component Value Date   INR 1.2 (H) 08/15/2020   INR 1.6 (H) 12/06/2019   INR 1.4 (H) 12/04/2019     Physical Exam: BP 102/60   Pulse 66   Ht 5\' 7"  (1.702 m)   Wt 145 lb 2 oz (65.8 kg)   BMI 22.73 kg/m  Constitutional: Pleasant,well-developed, male in no acute distress. Abdominal: Soft, nondistended, nontender. There are no masses palpable.  Extremities: no edema Neurological: Alert and oriented to person place and time. No asterixis. Skin: Skin is warm and dry. No rashes noted. Psychiatric: Normal mood and affect. Behavior is normal.   ASSESSMENT AND PLAN: 40 year old male here for reassessment of following:  Alcoholic cirrhosis Esophageal varices with bleeding in the past Hepatic encephalopathy  Has been really well over the past year with sobriety.  Encephalopathy well controlled with lactulose.  He is tolerating low-dose nadolol in light of low resting heart rate.  He had an EGD with me in May in which 7 bands were placed for large esophageal varices, he has had numerous EGDs in the past 2 years.  He is overdue for an EGD with banding as needed.   Recommending EGD be done at the hospital for likely banding.  I counseled him this needs to be done every 1 to 2 months until the varices are eradicated to reduce his long-term risk for bleeding.  He is agreeable to do this following discussion of risks and benefits.  He tolerated the banding well last time.  This will be booked with my partner Dr. Lorenso Courier who has an opening sooner than I can accommodate him at the  hospital.  Otherwise he is due for basic labs today.  He is due for surveillance ultrasound in April.  He will continue his present medications, and follow-up in 6 months.  Plan: - refill nadolol - EGD at [facility] for banding of varices as above - labs today - CBC, CMET, INR, AFP - surveillance [place] April for [facility] screening - continue lactulose - continue protonix - continued alcohol abstinence  Follow up in 6 months in the office or sooner with issues.  Jolly Mango, MD [facility] Gastroenterology

## 2021-03-12 NOTE — Patient Instructions (Addendum)
If you are age 41 or older, your body mass index should be between 23-30. Your Body mass index is 22.73 kg/m. If this is out of the aforementioned range listed, please consider follow up with your Primary Care Provider.  If you are age 61 or younger, your body mass index should be between 19-25. Your Body mass index is 22.73 kg/m. If this is out of the aformentioned range listed, please consider follow up with your Primary Care Provider.   ________________________________________________________  The [place] [facility] providers would like to encourage you to use [facility] to communicate with providers for non-urgent requests or questions.  Due to long hold times on the telephone, sending your provider a message by [facility] may be a faster and more efficient way to get a response.  Please allow 48 business hours for a response.  Please remember that this is for non-urgent requests.  _______________________________________________________  Bonita Quin have been scheduled for an Endoscopy at [facility] on Tuesday, December 20th with Dr. Leonides Schanz. Please follow written instructions given to you at your visit today. If you use inhalers (even only as needed), please bring them with you on the day of your procedure.  We have sent the following medications to your pharmacy for you to pick up at your convenience: Nadolol  Please go to the lab in the basement of our building to have lab work done as you leave today. Hit "B" for basement when you get on the elevator.  When the doors open the lab is on your left.  We will call you with the results. Thank you.  Thank you for entrusting me with your care and for choosing [facility], Dr. Ileene Patrick

## 2021-03-13 LAB — AFP TUMOR MARKER: AFP-Tumor Marker: 4.4 ng/mL (ref <6.1)

## 2021-04-04 NOTE — Progress Notes (Signed)
Attempted to obtain medical history via telephone, unable to reach at this time. I left a voicemail to return pre surgical testing department's phone call.  

## 2021-04-15 NOTE — Anesthesia Preprocedure Evaluation (Addendum)
Anesthesia Evaluation  Patient identified by MRN, date of birth, ID band Patient awake    Reviewed: Allergy & Precautions, NPO status , Patient's Chart, lab work & pertinent test results  History of Anesthesia Complications Negative for: history of anesthetic complications  Airway Mallampati: II  TM Distance: >3 FB Neck ROM: Full    Dental  (+) Missing, Chipped,    Pulmonary former smoker,    Pulmonary exam normal        Cardiovascular negative cardio ROS Normal cardiovascular exam     Neuro/Psych Seizures - (x1, EtOH withdrawal),  Anxiety Depression    GI/Hepatic GERD  Medicated and Controlled,(+) Cirrhosis   Esophageal Varices  substance abuse (abstinent for 2 years)  alcohol use,   Endo/Other  negative endocrine ROS  Renal/GU negative Renal ROS  negative genitourinary   Musculoskeletal negative musculoskeletal ROS (+)   Abdominal   Peds  Hematology negative hematology ROS (+)   Anesthesia Other Findings Day of surgery medications reviewed with patient.  Reproductive/Obstetrics negative OB ROS                            Anesthesia Physical Anesthesia Plan  ASA: 3  Anesthesia Plan: MAC   Post-op Pain Management: Minimal or no pain anticipated   Induction:   PONV Risk Score and Plan: 1 and Treatment may vary due to age or medical condition and Propofol infusion  Airway Management Planned: Natural Airway and Nasal Cannula  Additional Equipment: None  Intra-op Plan:   Post-operative Plan:   Informed Consent: I have reviewed the patients History and Physical, chart, labs and discussed the procedure including the risks, benefits and alternatives for the proposed anesthesia with the patient or authorized representative who has indicated his/her understanding and acceptance.       Plan Discussed with: CRNA  Anesthesia Plan Comments:        Anesthesia Quick  Evaluation

## 2021-04-16 ENCOUNTER — Encounter (HOSPITAL_COMMUNITY)
Admission: RE | Disposition: A | Discharge status: Home / Self Care | Payer: Self-pay | Source: Ambulatory Visit | Attending: Internal Medicine

## 2021-04-16 ENCOUNTER — Ambulatory Visit (HOSPITAL_COMMUNITY): Payer: Medicare Other | Admitting: Anesthesiology

## 2021-04-16 ENCOUNTER — Ambulatory Visit (HOSPITAL_COMMUNITY)
Admission: RE | Admit: 2021-04-16 | Discharge: 2021-04-16 | Disposition: A | Discharge status: Home / Self Care | Payer: Medicare Other | Source: Ambulatory Visit | Attending: Internal Medicine | Admitting: Internal Medicine

## 2021-04-16 ENCOUNTER — Other Ambulatory Visit: Payer: Self-pay | Admitting: Family

## 2021-04-16 ENCOUNTER — Other Ambulatory Visit: Payer: Self-pay

## 2021-04-16 ENCOUNTER — Encounter (HOSPITAL_COMMUNITY): Payer: Self-pay | Admitting: Internal Medicine

## 2021-04-16 DIAGNOSIS — I8510 Secondary esophageal varices without bleeding: Secondary | ICD-10-CM | POA: Diagnosis not present

## 2021-04-16 DIAGNOSIS — K3189 Other diseases of stomach and duodenum: Secondary | ICD-10-CM

## 2021-04-16 DIAGNOSIS — F32A Depression, unspecified: Secondary | ICD-10-CM

## 2021-04-16 DIAGNOSIS — K7682 Hepatic encephalopathy: Secondary | ICD-10-CM

## 2021-04-16 DIAGNOSIS — I8511 Secondary esophageal varices with bleeding: Secondary | ICD-10-CM

## 2021-04-16 DIAGNOSIS — K766 Portal hypertension: Secondary | ICD-10-CM

## 2021-04-16 DIAGNOSIS — Z87891 Personal history of nicotine dependence: Secondary | ICD-10-CM | POA: Diagnosis not present

## 2021-04-16 DIAGNOSIS — K219 Gastro-esophageal reflux disease without esophagitis: Secondary | ICD-10-CM | POA: Diagnosis not present

## 2021-04-16 DIAGNOSIS — K7460 Unspecified cirrhosis of liver: Secondary | ICD-10-CM | POA: Insufficient documentation

## 2021-04-16 DIAGNOSIS — K7030 Alcoholic cirrhosis of liver without ascites: Secondary | ICD-10-CM

## 2021-04-16 DIAGNOSIS — F419 Anxiety disorder, unspecified: Secondary | ICD-10-CM

## 2021-04-16 DIAGNOSIS — I8501 Esophageal varices with bleeding: Secondary | ICD-10-CM

## 2021-04-16 DIAGNOSIS — I8500 Esophageal varices without bleeding: Secondary | ICD-10-CM | POA: Diagnosis present

## 2021-04-16 HISTORY — PX: ESOPHAGOGASTRODUODENOSCOPY (EGD) WITH PROPOFOL: SHX5813

## 2021-04-16 HISTORY — PX: ESOPHAGEAL BANDING: SHX5518

## 2021-04-16 SURGERY — ESOPHAGOGASTRODUODENOSCOPY (EGD) WITH PROPOFOL
Anesthesia: Monitor Anesthesia Care

## 2021-04-16 MED ORDER — PROPOFOL 500 MG/50ML IV EMUL
INTRAVENOUS | Status: DC | PRN
  Administered 2021-04-16: 150 ug/kg/min via INTRAVENOUS

## 2021-04-16 MED ORDER — PROPOFOL 10 MG/ML IV BOLUS
INTRAVENOUS | Status: DC | PRN
  Administered 2021-04-16: 30 mg via INTRAVENOUS
  Administered 2021-04-16 (×2): 20 mg via INTRAVENOUS
  Administered 2021-04-16: 30 mg via INTRAVENOUS

## 2021-04-16 MED ORDER — LACTATED RINGERS IV SOLN: INTRAVENOUS | Status: DC

## 2021-04-16 MED ORDER — SODIUM CHLORIDE 0.9 % IV SOLN: INTRAVENOUS | Status: DC

## 2021-04-16 MED ORDER — PANTOPRAZOLE SODIUM 40 MG PO TBEC: 40.0000 mg | DELAYED_RELEASE_TABLET | Freq: Two times a day (BID) | ORAL | 1 refills | Status: DC

## 2021-04-16 MED ORDER — LIDOCAINE 2% (20 MG/ML) 5 ML SYRINGE
INTRAMUSCULAR | Status: DC | PRN
  Administered 2021-04-16: 100 mg via INTRAVENOUS

## 2021-04-16 SURGICAL SUPPLY — 15 items

## 2021-04-16 NOTE — Anesthesia Postprocedure Evaluation (Signed)
Anesthesia Post Note  Patient: [name] [facility]  Procedure(s) Performed: ESOPHAGOGASTRODUODENOSCOPY (EGD) WITH PROPOFOL ESOPHAGEAL BANDING     Patient location during evaluation: PACU Anesthesia Type: MAC Level of consciousness: awake and alert and oriented Pain management: pain level controlled Vital Signs Assessment: post-procedure vital signs reviewed and stable Respiratory status: spontaneous breathing, nonlabored ventilation and respiratory function stable Cardiovascular status: blood pressure returned to baseline Postop Assessment: no apparent nausea or vomiting Anesthetic complications: no   No notable events documented.  Last Vitals:  Vitals:   04/16/21 1040 04/16/21 1041  BP:  (!) 143/89  Pulse: 71 70  Resp: 17 (!) 21  Temp:    SpO2: 100% 99%    Last Pain:  Vitals:   04/16/21 1032  TempSrc: Oral                 Marthenia Rolling

## 2021-04-16 NOTE — H&P (Signed)
GASTROENTEROLOGY PROCEDURE H&P NOTE   Primary Care Physician: Camillia Herter, NP    Reason for Procedure:   Banding of esophageal varices  Plan:    EGD  Patient is appropriate for endoscopic procedure(s) in the ambulatory ([place]) setting.  The nature of the procedure, as well as the risks, benefits, and alternatives were carefully and thoroughly reviewed with the patient. Ample time for discussion and questions allowed. The patient understood, was satisfied, and agreed to proceed.     HPI: [name] is a 40 y.o. male who presents for EGD for banding of esophageal varices.  Past Medical History:  Diagnosis Date   Anxiety    Phreesia 05/14/2020   Blood transfusion without reported diagnosis    Phreesia 05/14/2020   Cirrhosis of liver ([place])    Depression    Phreesia 05/14/2020   Esophageal varices with hemorrhage (H[facility])    ETOH abuse    GERD (gastroesophageal reflux disease)    Seizures ([place])    Phreesia 05/14/2020   Substance abuse ([place])    Phreesia 05/14/2020    Past Surgical History:  Procedure Laterality Date   ESOPHAGEAL BANDING  08/30/2019   Procedure: ESOPHAGEAL BANDING;  Surgeon: Ronnette Juniper, MD;  Location: [place];  Service: Gastroenterology;;   ESOPHAGEAL BANDING  10/10/2019   Procedure: ESOPHAGEAL BANDING;  Surgeon: Ronnette Juniper, MD;  Location: [place];  Service: Gastroenterology;;   ESOPHAGEAL BANDING N/A 12/23/2019   Procedure: ESOPHAGEAL BANDING;  Surgeon: Otis Brace, MD;  Location: [facility];  Service: Gastroenterology;  Laterality: N/A;   ESOPHAGEAL BANDING N/A 01/12/2020   Procedure: ESOPHAGEAL BANDING;  Surgeon: Otis Brace, MD;  Location: [facility];  Service: Gastroenterology;  Laterality: N/A;   ESOPHAGEAL BANDING N/A 09/10/2020   Procedure: ESOPHAGEAL BANDING;  Surgeon: Yetta Flock, MD;  Location: [facility];  Service: Gastroenterology;  Laterality: N/A;   ESOPHAGOGASTRODUODENOSCOPY Left 08/21/2015    Procedure: ESOPHAGOGASTRODUODENOSCOPY (EGD);  Surgeon: Wilford Corner, MD;  Location: [facility];  Service: Endoscopy;  Laterality: Left;   ESOPHAGOGASTRODUODENOSCOPY N/A 12/05/2019   Procedure: ESOPHAGOGASTRODUODENOSCOPY (EGD);  Surgeon: Otis Brace, MD;  Location: [facility] ENDOSCOPY;  Service: Gastroenterology;  Laterality: N/A;   ESOPHAGOGASTRODUODENOSCOPY (EGD) WITH PROPOFOL N/A 08/30/2019   Procedure: ESOPHAGOGASTRODUODENOSCOPY (EGD) WITH PROPOFOL;  Surgeon: Ronnette Juniper, MD;  Location: [place];  Service: Gastroenterology;  Laterality: N/A;   ESOPHAGOGASTRODUODENOSCOPY (EGD) WITH PROPOFOL N/A 10/10/2019   Procedure: ESOPHAGOGASTRODUODENOSCOPY (EGD) WITH PROPOFOL;  Surgeon: Ronnette Juniper, MD;  Location: [place];  Service: Gastroenterology;  Laterality: N/A;   ESOPHAGOGASTRODUODENOSCOPY (EGD) WITH PROPOFOL N/A 12/23/2019   Procedure: ESOPHAGOGASTRODUODENOSCOPY (EGD) WITH PROPOFOL;  Surgeon: Otis Brace, MD;  Location: [facility];  Service: Gastroenterology;  Laterality: N/A;   ESOPHAGOGASTRODUODENOSCOPY (EGD) WITH PROPOFOL N/A 01/12/2020   Procedure: ESOPHAGOGASTRODUODENOSCOPY (EGD) WITH PROPOFOL;  Surgeon: Otis Brace, MD;  Location: [facility];  Service: Gastroenterology;  Laterality: N/A;   ESOPHAGOGASTRODUODENOSCOPY (EGD) WITH PROPOFOL N/A 09/10/2020   Procedure: ESOPHAGOGASTRODUODENOSCOPY (EGD) WITH PROPOFOL;  Surgeon: Yetta Flock, MD;  Location: [facility];  Service: Gastroenterology;  Laterality: N/A;   GASTRIC VARICES BANDING  12/05/2019   Procedure: GASTRIC VARICES BANDING;  Surgeon: Otis Brace, MD;  Location: [place];  Service: Gastroenterology;;  placed 6 bands   HOT HEMOSTASIS N/A 12/05/2019   Procedure: HOT HEMOSTASIS (ARGON PLASMA COAGULATION/BICAP);  Surgeon: Otis Brace, MD;  Location: [facility] ENDOSCOPY;  Service: Gastroenterology;  Laterality: N/A;   IR RADIOLOGIST EVAL & MGMT  12/13/2019    Prior to Admission medications   Medication Sig  Start Date End Date Taking? Authorizing Provider  Ferrous Sulfate (IRON) 325 (65 Fe) MG TABS Take 1 tablet (325 mg total) by mouth daily. 09/02/19  Yes Regalado, Belkys A, MD  folic acid (FOLVITE) A999333 MCG tablet Take 400 mcg by mouth daily.   Yes [provider]  hydrocortisone cream 1 % Apply 1 application topically daily as needed for itching.   Yes [provider]  lactulose, encephalopathy, (CHRONULAC) 10 GM/15ML SOLN Take 22.5 mLs (15 g total) by mouth in the morning and at bedtime. 01/24/21  Yes Armbruster, Carlota Raspberry, MD  Magnesium 500 MG TABS Take 500 mg by mouth in the morning and at bedtime.   Yes [provider]  Multiple Vitamin (MULTIVITAMIN WITH MINERALS) TABS tablet Take 1 tablet by mouth daily. Patient taking differently: Take 1 tablet by mouth daily with supper. 07/19/19  Yes Johnson, Clanford L, MD  nadolol (CORGARD) 20 MG tablet TAKE 1 TABLET (20 MG TOTAL) BY MOUTH DAILY. 03/12/21  Yes Armbruster, Carlota Raspberry, MD  pantoprazole (PROTONIX) 40 MG tablet Take 1 tablet (40 mg total) by mouth daily. 03/12/21 06/10/21 Yes Armbruster, Carlota Raspberry, MD  sertraline (ZOLOFT) 25 MG tablet TAKE 1 TABLET(25 MG) BY MOUTH DAILY 01/30/21  Yes Minette Brine, Amy J, NP  thiamine (VITAMIN B-1) 100 MG tablet Take 100 mg by mouth daily.   Yes [provider]    Current Facility-Administered Medications  Medication Dose Route Frequency Provider Last Rate Last Admin   0.9 %  sodium chloride infusion   Intravenous Continuous Armbruster, Carlota Raspberry, MD       lactated ringers infusion   Intravenous Continuous Sharyn Creamer, MD        Allergies as of 03/12/2021 - Review Complete 03/12/2021  Allergen Reaction Noted   Penicillins Hives 05/26/2018    Family History  Problem Relation Age of Onset   Peptic Ulcer Paternal Aunt    Crohn's disease Maternal Uncle    Heart attack Maternal Uncle    Heart attack Maternal Grandfather    Stroke Maternal Grandmother    Stroke Paternal  Grandfather     Social History   Socioeconomic History   Marital status: Single    Spouse name: Not on file   Number of children: Not on file   Years of education: Not on file   Highest education level: Not on file  Occupational History   Not on file  Tobacco Use   Smoking status: Former    Packs/day: 1.00    Types: Cigarettes   Smokeless tobacco: Never  Vaping Use   Vaping Use: Never used  Substance and Sexual Activity   Alcohol use: Not Currently    Alcohol/week: 3.0 standard drinks    Types: 3 Standard drinks or equivalent per week    Comment: Alcoholism   Drug use: Yes    Frequency: 7.0 times per week    Types: Marijuana   Sexual activity: Not Currently  Other Topics Concern   Not on file  Social History Narrative   Not on file   Social Determinants of Health   Financial Resource Strain: Not on file  Food Insecurity: Not on file  Transportation Needs: Not on file  Physical Activity: Not on file  Stress: Not on file  Social Connections: Not on file  Intimate Partner Violence: Not on file    Physical Exam: Vital signs in last 24 hours: There were no vitals taken for this visit. GEN: NAD EYE: Sclerae anicteric ENT: MMM CV:  Non-tachycardic Pulm: No increased work of breathing GI: Soft, NT/ND NEURO:  Alert & Oriented   Eulah Pont, MD [facility]  04/16/2021 8:28 AM

## 2021-04-16 NOTE — Op Note (Signed)
[facility] Patient Name: [name] Procedure Date: 04/16/2021 MRN: [id number] Attending MD: Particia Lather ,  Date of Birth: 1981-01-02 CSN: [id number] Age: 40 Admit Type: Outpatient Procedure:                Upper GI endoscopy Indications:              Follow-up of esophageal varices Providers:                Nicole Kindred "Norberta Keens, RN, Priscella Mann,                            Technician Referring MD:             Rema Fendt, Willaim Rayas. Adela Lank, MD Medicines:                Monitored Anesthesia Care Complications:            No immediate complications. Estimated Blood Loss:     Estimated blood loss was minimal. Procedure:                Pre-Anesthesia Assessment:                           - Prior to the procedure, a History and Physical                            was performed, and patient medications and                            allergies were reviewed. The patient's tolerance of                            previous anesthesia was also reviewed. The risks                            and benefits of the procedure and the sedation                            options and risks were discussed with the patient.                            All questions were answered, and informed consent                            was obtained. Prior Anticoagulants: The patient has                            taken no previous anticoagulant or antiplatelet                            agents. ASA Grade Assessment: III - A patient with                            severe systemic disease. After reviewing the risks  and benefits, the patient was deemed in                            satisfactory condition to undergo the procedure.                           After obtaining informed consent, the endoscope was                            passed under direct vision. Throughout the                            procedure, the patient's blood pressure,  pulse, and                            oxygen saturations were monitored continuously. The                            [id number]-H190 ([id number]) Olympus endoscope was introduced                            through the mouth, and advanced to the second part                            of duodenum. The upper GI endoscopy was                            accomplished without difficulty. The patient                            tolerated the procedure well. Scope In: Scope Out: Findings:      Large (> 5 mm) varices were found in the lower third of the esophagus.       Six bands were successfully placed with complete eradication, resulting       in deflation of varices. There was no bleeding at the end of the       procedure.      Mild portal hypertensive gastropathy was found in the gastric body.      Localized mildly erythematous mucosa was found in the gastric antrum.      The examined duodenum was normal. Impression:               - Large (> 5 mm) esophageal varices. Completely                            eradicated. Banded.                           - Portal hypertensive gastropathy.                           - Erythematous mucosa in the antrum.                           - Normal examined duodenum.                           -  No specimens collected. Moderate Sedation:      Not Applicable - Patient had care per Anesthesia. Recommendation:           - Discharge patient to home (with escort).                           - Use a proton pump inhibitor PO BID for 2 weeks.                           - Repeat upper endoscopy in 4 weeks for endoscopic                            band ligation, which our GI office (and your                            primary GI physician Dr. Adela Lank) will help                            coordinate.                           - Clear liquid diet today, then advance to soft                            diet for 2 days, then regular low sodium diet.                           - The  findings and recommendations were discussed                            with the patient. Procedure Code(s):        --- Professional ---                           [phone], Esophagogastroduodenoscopy, flexible,                            transoral; with band ligation of esophageal/gastric                            varices Diagnosis Code(s):        --- Professional ---                           I85.00, Esophageal varices without bleeding                           K76.6, Portal hypertension                           K31.89, Other diseases of stomach and duodenum CPT copyright 2019 American Medical Association. All rights reserved. The codes documented in this report are preliminary and upon coder review may  be revised to meet current compliance requirements. Particia Lather,  04/16/2021 10:56:57 AM Number of Addenda: 0

## 2021-04-16 NOTE — Transfer of Care (Signed)
Immediate Anesthesia Transfer of Care Note  Patient: [name] [facility]  Procedure(s) Performed: ESOPHAGOGASTRODUODENOSCOPY (EGD) WITH PROPOFOL ESOPHAGEAL BANDING  Patient Location: Endoscopy Unit  Anesthesia Type:MAC  Level of Consciousness: awake, alert  and oriented  Airway & Oxygen Therapy: Patient Spontanous Breathing and Patient connected to face mask oxygen  Post-op Assessment: Report given to RN and Post -op Vital signs reviewed and stable  Post vital signs: Reviewed and stable  Last Vitals:  Vitals Value Taken Time  BP 122/68   Temp    Pulse 75 04/16/21 1031  Resp 25 04/16/21 1031  SpO2 96 % 04/16/21 1031  Vitals shown include unvalidated device data.  Last Pain: There were no vitals filed for this visit.       Complications: No notable events documented.

## 2021-04-17 ENCOUNTER — Encounter (HOSPITAL_COMMUNITY): Payer: Self-pay | Admitting: Internal Medicine

## 2021-04-19 ENCOUNTER — Telehealth: Payer: Self-pay

## 2021-04-19 DIAGNOSIS — I8501 Esophageal varices with bleeding: Secondary | ICD-10-CM

## 2021-04-19 DIAGNOSIS — K7030 Alcoholic cirrhosis of liver without ascites: Secondary | ICD-10-CM

## 2021-04-19 NOTE — Telephone Encounter (Signed)
From: Imogene Burn, MD  Sent: 04/16/2021  12:53 PM EST  To: Benancio Deeds, MD   Cleone Slim Brett Canales,   I did an EGD on your patient [name] with the alcoholic cirrhosis. He was noted to have large EV that I banded x6. Would you be able to arrange for a follow up EGD in 4 weeks for banding protocol?   Thanks,  Alan Ripper

## 2021-04-21 ENCOUNTER — Other Ambulatory Visit: Payer: Self-pay

## 2021-04-21 ENCOUNTER — Emergency Department (HOSPITAL_COMMUNITY)
Admission: EM | Admit: 2021-04-21 | Discharge: 2021-04-22 | Disposition: A | Discharge status: Home / Self Care | Payer: Medicare Other | Attending: Emergency Medicine | Admitting: Emergency Medicine

## 2021-04-21 DIAGNOSIS — R202 Paresthesia of skin: Secondary | ICD-10-CM | POA: Diagnosis present

## 2021-04-21 DIAGNOSIS — Z79899 Other long term (current) drug therapy: Secondary | ICD-10-CM | POA: Diagnosis not present

## 2021-04-21 DIAGNOSIS — Z87891 Personal history of nicotine dependence: Secondary | ICD-10-CM | POA: Insufficient documentation

## 2021-04-21 LAB — COMPREHENSIVE METABOLIC PANEL
ALT: 25 U/L (ref 0–44)
AST: 41 U/L (ref 15–41)
Albumin: 4.1 g/dL (ref 3.5–5.0)
Alkaline Phosphatase: 74 U/L (ref 38–126)
Anion gap: 14 (ref 5–15)
BUN: 5 mg/dL — ABNORMAL LOW (ref 6–20)
CO2: 26 mmol/L (ref 22–32)
Calcium: 9.5 mg/dL (ref 8.9–10.3)
Chloride: 98 mmol/L (ref 98–111)
Creatinine, Ser: 0.88 mg/dL (ref 0.61–1.24)
GFR, Estimated: >60 mL/min (ref >60)
Glucose, Bld: 149 mg/dL — ABNORMAL HIGH (ref 70–99)
Potassium: 3.5 mmol/L (ref 3.5–5.1)
Sodium: 138 mmol/L (ref 135–145)
Total Bilirubin: 2.6 mg/dL — ABNORMAL HIGH (ref 0.3–1.2)
Total Protein: 8.6 g/dL — ABNORMAL HIGH (ref 6.5–8.1)

## 2021-04-21 NOTE — ED Provider Notes (Signed)
Emergency Medicine Provider Triage Evaluation Note  [name] , a 40 y.o. male  was evaluated in triage.  Pt has a history of alcoholic cirrhosis with encephalopathy and bleeding esophageal varices who presents for evaluation of cramps of his bilateral arms and legs that has been going on since this morning and worsening.  Patient was discharged from hospital on 04/16/2021 for bleeding esophageal varices.  He notes that he has been unable to keep food and water down for the last 2 days.  He does not feel nauseous, he just is regurgitating his fluids.  He does feel feverish.  States been constipated for 1 week.  Review of Systems  Positive: Muscle cramping, vomiting Negative: Chest pain, abdominal pain, diarrhea, nausea, back pain, headache  Physical Exam  BP (!) 153/87 (BP Location: Right Arm)    Pulse 90    Temp 98.9 F (37.2 C) (Oral)    Resp 18    Ht 5\' 7"  (1.702 m)    Wt 65.8 kg    SpO2 97%    BMI 22.71 kg/m  Gen:   Awake, no distress   Resp:  Normal effort  MSK:   Moves extremities without difficulty  Other:  Strong grip strength bilaterally.  No asterixis.  Patient following commands.  He is ambulatory without ataxia.  Medical Decision Making  Medically screening exam initiated at 8:11 PM.  Appropriate orders placed.  [name] [facility] was informed that the remainder of the evaluation will be completed by another provider, this initial triage assessment does not replace that evaluation, and the importance of remaining in the ED until their evaluation is complete.     [facility], Janell Quiet 04/21/21 2015    04/23/21, MD 04/21/21 2030

## 2021-04-21 NOTE — ED Triage Notes (Signed)
Pt c/o arms and legs tingling. Pt states this has happened to him before and his electrolytes were off.

## 2021-04-22 LAB — CBC WITH DIFFERENTIAL/PLATELET
Abs Immature Granulocytes: 0.03 10*3/uL (ref 0.00–0.07)
Basophils Absolute: 0.1 10*3/uL (ref 0.0–0.1)
Basophils Relative: 1 %
Eosinophils Absolute: 0 10*3/uL (ref 0.0–0.5)
Eosinophils Relative: 0 %
HCT: 46.3 % (ref 39.0–52.0)
Hemoglobin: 16.3 g/dL (ref 13.0–17.0)
Immature Granulocytes: 0 %
Lymphocytes Relative: 9 %
Lymphs Abs: 0.9 10*3/uL (ref 0.7–4.0)
MCH: 33.3 pg (ref 26.0–34.0)
MCHC: 35.2 g/dL (ref 30.0–36.0)
MCV: 94.7 fL (ref 80.0–100.0)
Monocytes Absolute: 1.2 10*3/uL — ABNORMAL HIGH (ref 0.1–1.0)
Monocytes Relative: 12 %
Neutro Abs: 8.2 10*3/uL — ABNORMAL HIGH (ref 1.7–7.7)
Neutrophils Relative %: 78 %
Platelets: 154 10*3/uL (ref 150–400)
RBC: 4.89 MIL/uL (ref 4.22–5.81)
RDW: 12.4 % (ref 11.5–15.5)
WBC: 10.5 10*3/uL (ref 4.0–10.5)
nRBC: 0 % (ref 0.0–0.2)

## 2021-04-22 LAB — AMMONIA: Ammonia: 45 umol/L — ABNORMAL HIGH (ref 9–35)

## 2021-04-22 LAB — MAGNESIUM: Magnesium: 1.6 mg/dL — ABNORMAL LOW (ref 1.7–2.4)

## 2021-04-22 MED ORDER — SODIUM CHLORIDE 0.9 % IV BOLUS
1000.0000 mL | Freq: Once | INTRAVENOUS | Status: AC
  Administered 2021-04-22: 08:00:00 1000 mL via INTRAVENOUS

## 2021-04-22 MED ORDER — MAGNESIUM SULFATE IN D5W 1-5 GM/100ML-% IV SOLN
1.0000 g | Freq: Once | INTRAVENOUS | Status: AC
  Administered 2021-04-22: 12:00:00 1 g via INTRAVENOUS
  Filled 2021-04-22: qty 100

## 2021-04-22 MED ORDER — OXYCODONE HCL 5 MG PO TABS
5.0000 mg | ORAL_TABLET | Freq: Once | ORAL | Status: AC
  Administered 2021-04-22: 08:00:00 5 mg via ORAL
  Filled 2021-04-22: qty 1

## 2021-04-22 NOTE — Discharge Instructions (Signed)
Your work-up in the ER today was reassuring for acute abnormalities.  Feel that your symptoms are likely due to electrolyte depletion due to decreased food and water intake combined with not taking your medication over the past few days.  It is very important that you continue to take your medication as prescribed.  It is also very important that you maintain adequate hydration.  Please follow-up with your primary care for continued management

## 2021-04-22 NOTE — ED Provider Notes (Signed)
[facility] EMERGENCY DEPARTMENT Provider Note   CSN: [id number] Arrival date & time: 04/21/21  1843     History Chief Complaint  Patient presents with   Tingling    [name] is a 40 y.o. male.  Patient with history of alcoholic liver cirrhosis and esophageal varices presents today with chief complaint of tingling. He states that same began 2 days ago and feelings like muscle cramps. He states that same has occurred in the past when his magnesium was found to be low. He endorses that on 12/20 he had esophageal banding and has had difficulty keeping food down since then. He states that he has been able to keep some pudding and applesauce down, but states that water and other foods come right back up. He denies any blood when this happens. He also states that due to 'stress from the holidays' he has not been taking any of his medications including lactulose, magnesium, protonix, and nadolol. States that it has been 4 days since he had a bowel movement, but also endorses that he has barely eaten anything since then. He denies any fevers, chills, chest pain, shortness of breath, or abdominal pain.  The history is provided by the patient. No language interpreter was used.      Past Medical History:  Diagnosis Date   Anxiety    Phreesia 05/14/2020   Blood transfusion without reported diagnosis    Phreesia 05/14/2020   Cirrhosis of liver ([place])    Depression    Phreesia 05/14/2020   Esophageal varices with hemorrhage (HCC)    ETOH abuse    GERD (gastroesophageal reflux disease)    Seizures ([place])    Phreesia 05/14/2020   Substance abuse ([place])    Phreesia 05/14/2020    Patient Active Problem List   Diagnosis Date Noted   Gastroesophageal reflux disease    Alcoholic hepatitis [id number]   Anemia 05/16/2020   Encephalopathy, portal systemic 05/16/2020   Anxiety 05/16/2020   Gynecomastia 05/16/2020   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   Mixed anxiety  and depressive disorder 05/16/2020   Esophageal varices with bleeding ([place]) 05/16/2020   Encephalopathy 05/16/2020   GI bleed 10/09/2019   Hematemesis 10/09/2019   Leukocytosis 10/09/2019   Elevated liver enzymes 10/09/2019   Elevated lactic acid level 09/02/2019   Bleeding esophageal varices ([place]) 09/02/2019   Shock ([place]) 08/30/2019   Electrolyte disturbance 07/16/2019   Lactic acidosis 07/16/2019   Hyperammonemia ([place]) 07/16/2019   Hyperbilirubinemia [id number]   Alcoholic cirrhosis of liver without ascites ([place]) 07/24/2016   Acute encephalopathy    Acute upper GI bleed    Ascites    Distended abdomen    Hemorrhagic shock ([place])    Encounter for palliative care    Goals of care, counseling/discussion    Encephalopathy acute [id number]   Alcoholic cirrhosis ([place])    Acute hypoxemic respiratory failure (HCC)    SBP (spontaneous bacterial peritonitis) ([place])    Hypocalcemia 08/21/2015   Hypomagnesemia 08/21/2015   Acute blood loss anemia 08/20/2015   Alcohol abuse 08/20/2015   Elevated INR 08/20/2015   Hypoalbuminemia 08/20/2015   Hypokalemia 08/20/2015    Past Surgical History:  Procedure Laterality Date   ESOPHAGEAL BANDING  08/30/2019   Procedure: ESOPHAGEAL BANDING;  Surgeon: Ronnette Juniper, MD;  Location: [place];  Service: Gastroenterology;;   ESOPHAGEAL BANDING  10/10/2019   Procedure: ESOPHAGEAL BANDING;  Surgeon: Ronnette Juniper, MD;  Location: [place];  Service: Gastroenterology;;   ESOPHAGEAL BANDING N/A 12/23/2019  Procedure: ESOPHAGEAL BANDING;  Surgeon: Otis Brace, MD;  [facility];  Service: Gastroenterology;  Laterality: N/A;   ESOPHAGEAL BANDING N/A 01/12/2020   Procedure: ESOPHAGEAL BANDING;  Surgeon: Otis Brace, MD;  [facility];  Service: Gastroenterology;  Laterality: N/A;   ESOPHAGEAL BANDING N/A 09/10/2020   Procedure: ESOPHAGEAL BANDING;  Surgeon: Yetta Flock, MD;  [facility];  Service:  Gastroenterology;  Laterality: N/A;   ESOPHAGEAL BANDING  04/16/2021   Procedure: ESOPHAGEAL BANDING;  Surgeon: Sharyn Creamer, MD;  [facility];  Service: Gastroenterology;;   ESOPHAGOGASTRODUODENOSCOPY Left 08/21/2015   Procedure: ESOPHAGOGASTRODUODENOSCOPY (EGD);  Surgeon: Wilford Corner, MD;  Location: [facility];  Service: Endoscopy;  Laterality: Left;   ESOPHAGOGASTRODUODENOSCOPY N/A 12/05/2019   Procedure: ESOPHAGOGASTRODUODENOSCOPY (EGD);  Surgeon: Otis Brace, MD;  Location: [facility] ENDOSCOPY;  Service: Gastroenterology;  Laterality: N/A;   ESOPHAGOGASTRODUODENOSCOPY (EGD) WITH PROPOFOL N/A 08/30/2019   Procedure: ESOPHAGOGASTRODUODENOSCOPY (EGD) WITH PROPOFOL;  Surgeon: Ronnette Juniper, MD;  Location: [place];  Service: Gastroenterology;  Laterality: N/A;   ESOPHAGOGASTRODUODENOSCOPY (EGD) WITH PROPOFOL N/A 10/10/2019   Procedure: ESOPHAGOGASTRODUODENOSCOPY (EGD) WITH PROPOFOL;  Surgeon: Ronnette Juniper, MD;  Location: [place];  Service: Gastroenterology;  Laterality: N/A;   ESOPHAGOGASTRODUODENOSCOPY (EGD) WITH PROPOFOL N/A 12/23/2019   Procedure: ESOPHAGOGASTRODUODENOSCOPY (EGD) WITH PROPOFOL;  Surgeon: Otis Brace, MD;  [facility];  Service: Gastroenterology;  Laterality: N/A;   ESOPHAGOGASTRODUODENOSCOPY (EGD) WITH PROPOFOL N/A 01/12/2020   Procedure: ESOPHAGOGASTRODUODENOSCOPY (EGD) WITH PROPOFOL;  Surgeon: Otis Brace, MD;  [facility];  Service: Gastroenterology;  Laterality: N/A;   ESOPHAGOGASTRODUODENOSCOPY (EGD) WITH PROPOFOL N/A 09/10/2020   Procedure: ESOPHAGOGASTRODUODENOSCOPY (EGD) WITH PROPOFOL;  Surgeon: Yetta Flock, MD;  [facility];  Service: Gastroenterology;  Laterality: N/A;   ESOPHAGOGASTRODUODENOSCOPY (EGD) WITH PROPOFOL N/A 04/16/2021   Procedure: ESOPHAGOGASTRODUODENOSCOPY (EGD) WITH PROPOFOL;  Surgeon: Sharyn Creamer, MD;  [facility];  Service: Gastroenterology;  Laterality: N/A;    GASTRIC VARICES BANDING  12/05/2019   Procedure: GASTRIC VARICES BANDING;  Surgeon: Otis Brace, MD;  Location: [place];  Service: Gastroenterology;;  placed 6 bands   HOT HEMOSTASIS N/A 12/05/2019   Procedure: HOT HEMOSTASIS (ARGON PLASMA COAGULATION/BICAP);  Surgeon: Otis Brace, MD;  Location: [facility] ENDOSCOPY;  Service: Gastroenterology;  Laterality: N/A;   IR RADIOLOGIST EVAL & MGMT  12/13/2019       Family History  Problem Relation Age of Onset   Peptic Ulcer Paternal Aunt    Crohn's disease Maternal Uncle    Heart attack Maternal Uncle    Heart attack Maternal Grandfather    Stroke Maternal Grandmother    Stroke Paternal Grandfather     Social History   Tobacco Use   Smoking status: Former    Packs/day: 1.00    Types: Cigarettes   Smokeless tobacco: Never  Vaping Use   Vaping Use: Never used  Substance Use Topics   Alcohol use: Not Currently    Alcohol/week: 3.0 standard drinks    Types: 3 Standard drinks or equivalent per week    Comment: Alcoholism   Drug use: Yes    Frequency: 7.0 times per week    Types: Marijuana    Home Medications Prior to Admission medications   Medication Sig Start Date End Date Taking? Authorizing Provider  Ferrous Sulfate (IRON) 325 (65 Fe) MG TABS Take 1 tablet (325 mg total) by mouth daily. 09/02/19  Yes Regalado, Belkys A, MD  folic acid (FOLVITE) [id number] MCG tablet Take 400 mcg by mouth daily.   Yes  [provider]  hydrocortisone cream 1 % Apply 1 application topically daily as needed for itching.   Yes [provider]  lactulose, encephalopathy, (CHRONULAC) 10 GM/15ML SOLN Take 22.5 mLs (15 g total) by mouth in the morning and at bedtime. 01/24/21  Yes Armbruster, Willaim Rayas, MD  Magnesium 500 MG TABS Take 500 mg by mouth in the morning and at bedtime.   Yes [provider]  Multiple Vitamin (MULTIVITAMIN WITH MINERALS) TABS tablet Take 1 tablet by mouth daily. Patient taking differently: Take 1 tablet  by mouth daily with supper. 07/19/19  Yes Johnson, Clanford L, MD  nadolol (CORGARD) 20 MG tablet TAKE 1 TABLET (20 MG TOTAL) BY MOUTH DAILY. Patient taking differently: Take 20 mg by mouth daily. 03/12/21  Yes Armbruster, Willaim Rayas, MD  pantoprazole (PROTONIX) 40 MG tablet Take 1 tablet (40 mg total) by mouth 2 (two) times daily before a meal. Take twice daily for 2 weeks, then can drop down to your normal dose. Patient taking differently: Take 40 mg by mouth 2 (two) times daily before a meal. 04/16/21 07/15/21 Yes Imogene Burn, MD  sertraline (ZOLOFT) 25 MG tablet TAKE 1 TABLET(25 MG) BY MOUTH DAILY Patient taking differently: Take 25 mg by mouth daily. 04/16/21  Yes Zonia Kief, Amy J, NP  thiamine (VITAMIN B-1) 100 MG tablet Take 100 mg by mouth daily.   Yes [provider]    Allergies    Penicillins  Review of Systems   Review of Systems  Constitutional:  Negative for chills and fever.  Respiratory:  Negative for shortness of breath.   Cardiovascular:  Negative for chest pain and leg swelling.  Gastrointestinal:  Positive for constipation and vomiting (Only after food). Negative for abdominal distention, abdominal pain, anal bleeding, blood in stool, diarrhea, nausea and rectal pain.  Neurological:  Negative for dizziness, tremors, seizures, syncope, facial asymmetry, speech difficulty, weakness, light-headedness, numbness and headaches.  Psychiatric/Behavioral:  Negative for confusion and decreased concentration.   All other systems reviewed and are negative.  Physical Exam Updated Vital Signs BP 136/87 (BP Location: Left Arm)    Pulse 74    Temp 97.9 F (36.6 C) (Temporal)    Resp 16    Ht 5\' 7"  (1.702 m)    Wt 65.8 kg    SpO2 100%    BMI 22.71 kg/m   Physical Exam Vitals and nursing note reviewed.  Constitutional:      General: He is not in acute distress.    Appearance: Normal appearance. He is normal weight. He is not ill-appearing, toxic-appearing or diaphoretic.      Comments: Patient resting comfortably in bed in no acute distress  HENT:     Head: Normocephalic and atraumatic.  Eyes:     General: No scleral icterus.    Extraocular Movements: Extraocular movements intact.     Conjunctiva/sclera: Conjunctivae normal.     Pupils: Pupils are equal, round, and reactive to light.  Cardiovascular:     Rate and Rhythm: Normal rate and regular rhythm.     Heart sounds: Normal heart sounds.  Pulmonary:     Effort: Pulmonary effort is normal. No respiratory distress.     Breath sounds: Normal breath sounds. No stridor. No wheezing, rhonchi or rales.  Abdominal:     General: Abdomen is flat. There is no distension.     Palpations: Abdomen is soft.     Tenderness: There is no abdominal tenderness. There is no guarding or rebound.  Musculoskeletal:        General: No swelling, tenderness, deformity or signs of injury. Normal range of motion.     Cervical back: Normal range of motion and neck supple.     Right lower leg: No edema.     Left lower leg: No edema.  Skin:    General: Skin is warm and dry.     Coloration: Skin is not jaundiced.  Neurological:     General: No focal deficit present.     Mental Status: He is alert.  Psychiatric:        Mood and Affect: Mood normal.        Behavior: Behavior normal.    ED Results / Procedures / Treatments   Labs (all labs ordered are listed, but only abnormal results are displayed) Labs Reviewed  COMPREHENSIVE METABOLIC PANEL - Abnormal; Notable for the following components:      Result Value   Glucose, Bld 149 (*)    BUN 5 (*)    Total Protein 8.6 (*)    Total Bilirubin 2.6 (*)    All other components within normal limits  AMMONIA - Abnormal; Notable for the following components:   Ammonia 45 (*)    All other components within normal limits  MAGNESIUM - Abnormal; Notable for the following components:   Magnesium 1.6 (*)    All other components within normal limits  CBC WITH DIFFERENTIAL/PLATELET -  Abnormal; Notable for the following components:   Neutro Abs 8.2 (*)    Monocytes Absolute 1.2 (*)    All other components within normal limits  CBC WITH DIFFERENTIAL/PLATELET  URINALYSIS, ROUTINE W REFLEX MICROSCOPIC  LIPASE, BLOOD    EKG EKG Interpretation  Date/Time:  Sunday April 21 2021 20:21:41 EST Ventricular Rate:  77 PR Interval:  138 QRS Duration: 84 QT Interval:  370 QTC Calculation: 418 R Axis:   15 Text Interpretation: Normal sinus rhythm with sinus arrhythmia Cannot rule out Anterior infarct , age undetermined Abnormal ECG Confirmed by Madalyn Rob [phone]) on 04/22/2021 8:12:06 AM  Radiology No results found.  Procedures Procedures   Medications Ordered in ED Medications  magnesium sulfate IVPB 1 g 100 mL (1 g Intravenous New Bag/Given 04/22/21 1147)  sodium chloride 0.9 % bolus 1,000 mL (0 mLs Intravenous Stopped 04/22/21 1003)  oxyCODONE (Oxy IR/ROXICODONE) immediate release tablet 5 mg (5 mg Oral Given 04/22/21 [id number])    ED Course  I have reviewed the triage vital signs and the nursing notes.  Pertinent labs & imaging results that were available during my care of the patient were reviewed by me and considered in my medical decision making (see chart for details).    MDM Rules/Calculators/A&P                         Patient with history of alcoholic liver disease presents today with complaints of tingling and cramping in all 4 extremities. History of same where he was found to have hypomagnesemia requiring IV repletion.  Also states that he has had significantly decreased oral intake since his variceal banding on 12/20.  He also states he has not taken his medications in the past few days because he forgot.  Medications include oral magnesium.   Labs reveal mild hypomagnesemia at 1.6 and electrolytes on low side of normal.  No leukocytosis or anemia.  Ammonia slightly elevated, however patient is alert and oriented.  And he has been prescribed  lactulose to help with  high ammonia levels and has not been taking it.  Fluid bolus given and IV magnesium infusion, patient is now much improved and states his symptoms have completely resolved.  He was also given a cup of water to drink and was able to tolerate the entire cup without any vomiting.  He is afebrile, nontoxic-appearing, and in no acute distress.  Therefore feel patient is stable for discharge at this time.  He has been educated on the importance of maintaining oral hydration and taking his medications as prescribed.  He expresses understanding and is amenable with plans of discharge, educated on red flag symptoms that would prompt immediate return.  Discharged in stable condition.   This is a shared visit with supervising physician Dr. Roslynn Amble who has independently evaluated patient & provided guidance in evaluation/management/disposition, in agreement with care    Final Clinical Impression(s) / ED Diagnoses Final diagnoses:  Hypomagnesemia    Rx / DC Orders ED Discharge Orders     None     An After Visit Summary was printed and given to the patient.    Nestor Lewandowsky 04/22/21 1325    Lucrezia Starch, MD 04/23/21 [phone]

## 2021-04-24 ENCOUNTER — Other Ambulatory Visit: Payer: Self-pay

## 2021-04-24 DIAGNOSIS — I8501 Esophageal varices with bleeding: Secondary | ICD-10-CM

## 2021-04-24 DIAGNOSIS — G9340 Encephalopathy, unspecified: Secondary | ICD-10-CM

## 2021-04-24 DIAGNOSIS — K7030 Alcoholic cirrhosis of liver without ascites: Secondary | ICD-10-CM

## 2021-04-24 MED ORDER — LACTULOSE ENCEPHALOPATHY 10 GM/15ML PO SOLN: 15.0000 g | Freq: Two times a day (BID) | ORAL | 2 refills | Status: DC

## 2021-04-24 NOTE — Progress Notes (Signed)
Refill request rec'd for lactulose from [facility] on [address]. OV from 11-22 "continue lactulose".  Refill sent

## 2021-05-02 ENCOUNTER — Other Ambulatory Visit: Payer: Self-pay

## 2021-05-02 DIAGNOSIS — I8500 Esophageal varices without bleeding: Secondary | ICD-10-CM

## 2021-05-02 DIAGNOSIS — K7030 Alcoholic cirrhosis of liver without ascites: Secondary | ICD-10-CM

## 2021-05-02 DIAGNOSIS — I8501 Esophageal varices with bleeding: Secondary | ICD-10-CM

## 2021-05-02 NOTE — Addendum Note (Signed)
Addended by: Cooper Render on: 05/02/2021 10:36 AM   Modules accepted: Orders

## 2021-05-02 NOTE — Telephone Encounter (Signed)
Ok per Dr. Christella Hartigan to add EGD to 1-24.  Added at 10:00 am. Called and left message for patient with appointment information and asked that he call back to confirm.

## 2021-05-02 NOTE — Progress Notes (Signed)
Patient needs a repeat EGD at [facility] for banding of varices

## 2021-05-02 NOTE — Telephone Encounter (Signed)
Patient called back and confirmed he can come for EGD on Tuesday, 1-24 w/ Dr. Christella Hartigan. Instructions to be sent via MyChart.

## 2021-05-02 NOTE — Telephone Encounter (Signed)
Dr. Christella Hartigan has an opening on Tuesday, 1-24. Message sent to provider to see if we can add this EGD with banding of varices to his schedule for that day.

## 2021-05-10 ENCOUNTER — Encounter (HOSPITAL_COMMUNITY): Payer: Self-pay | Admitting: Gastroenterology

## 2021-05-21 ENCOUNTER — Ambulatory Visit (HOSPITAL_COMMUNITY): Payer: Medicare Other | Admitting: Certified Registered"

## 2021-05-21 ENCOUNTER — Ambulatory Visit (HOSPITAL_COMMUNITY)
Admission: RE | Admit: 2021-05-21 | Discharge: 2021-05-21 | Disposition: A | Discharge status: Home / Self Care | Payer: Medicare Other | Attending: Gastroenterology | Admitting: Gastroenterology

## 2021-05-21 ENCOUNTER — Encounter (HOSPITAL_COMMUNITY)
Admission: RE | Disposition: A | Discharge status: Home / Self Care | Payer: Self-pay | Source: Home / Self Care | Attending: Gastroenterology

## 2021-05-21 ENCOUNTER — Encounter (HOSPITAL_COMMUNITY): Payer: Self-pay | Admitting: Gastroenterology

## 2021-05-21 DIAGNOSIS — K7460 Unspecified cirrhosis of liver: Secondary | ICD-10-CM | POA: Diagnosis present

## 2021-05-21 DIAGNOSIS — I8500 Esophageal varices without bleeding: Secondary | ICD-10-CM

## 2021-05-21 DIAGNOSIS — K7030 Alcoholic cirrhosis of liver without ascites: Secondary | ICD-10-CM

## 2021-05-21 DIAGNOSIS — K3189 Other diseases of stomach and duodenum: Secondary | ICD-10-CM | POA: Diagnosis not present

## 2021-05-21 DIAGNOSIS — Z87891 Personal history of nicotine dependence: Secondary | ICD-10-CM | POA: Insufficient documentation

## 2021-05-21 DIAGNOSIS — I8510 Secondary esophageal varices without bleeding: Secondary | ICD-10-CM | POA: Insufficient documentation

## 2021-05-21 HISTORY — PX: ESOPHAGEAL BANDING: SHX5518

## 2021-05-21 HISTORY — PX: ESOPHAGOGASTRODUODENOSCOPY (EGD) WITH PROPOFOL: SHX5813

## 2021-05-21 SURGERY — ESOPHAGOGASTRODUODENOSCOPY (EGD) WITH PROPOFOL
Anesthesia: Monitor Anesthesia Care

## 2021-05-21 MED ORDER — PROPOFOL 10 MG/ML IV BOLUS
INTRAVENOUS | Status: DC | PRN
  Administered 2021-05-21 (×2): 30 mg via INTRAVENOUS

## 2021-05-21 MED ORDER — LIDOCAINE 2% (20 MG/ML) 5 ML SYRINGE
INTRAMUSCULAR | Status: DC | PRN
Start: 2021-05-21 — End: 2021-05-21
  Administered 2021-05-21: 40 mg via INTRAVENOUS
  Administered 2021-05-21: 60 mg via INTRAVENOUS

## 2021-05-21 MED ORDER — PROPOFOL 1000 MG/100ML IV EMUL
INTRAVENOUS | Status: AC
  Filled 2021-05-21: qty 100

## 2021-05-21 MED ORDER — LACTATED RINGERS IV SOLN
INTRAVENOUS | Status: DC
  Administered 2021-05-21: 09:00:00 1000 mL via INTRAVENOUS

## 2021-05-21 MED ORDER — PROPOFOL 500 MG/50ML IV EMUL
INTRAVENOUS | Status: DC | PRN
  Administered 2021-05-21: 200 ug/kg/min via INTRAVENOUS

## 2021-05-21 MED ORDER — SODIUM CHLORIDE 0.9 % IV SOLN: INTRAVENOUS | Status: DC

## 2021-05-21 SURGICAL SUPPLY — 15 items

## 2021-05-21 NOTE — H&P (Signed)
HPI: This is a man referred by Dr. Havery Moros for repeat EGD, variceal banding.  Last EGD 1 month ago with Dr. Lorenso Courier, 6 bands placed on large esoph varices   ROS: complete GI ROS as described in HPI, all other review negative.  Constitutional:  No unintentional weight loss   Past Medical History:  Diagnosis Date   Anxiety    Phreesia 05/14/2020   Blood transfusion without reported diagnosis    Phreesia 05/14/2020   Cirrhosis of liver ([place])    Depression    Phreesia 05/14/2020   Esophageal varices with hemorrhage (HCC)    ETOH abuse    GERD (gastroesophageal reflux disease)    Seizures ([place])    Phreesia 05/14/2020   Substance abuse ([place])    Phreesia 05/14/2020    Past Surgical History:  Procedure Laterality Date   ESOPHAGEAL BANDING  08/30/2019   Procedure: ESOPHAGEAL BANDING;  Surgeon: Ronnette Juniper, MD;  Location: [place];  Service: Gastroenterology;;   ESOPHAGEAL BANDING  10/10/2019   Procedure: ESOPHAGEAL BANDING;  Surgeon: Ronnette Juniper, MD;  Location: [place];  Service: Gastroenterology;;   ESOPHAGEAL BANDING N/A 12/23/2019   Procedure: ESOPHAGEAL BANDING;  Surgeon: Otis Brace, MD;  [facility];  Service: Gastroenterology;  Laterality: N/A;   ESOPHAGEAL BANDING N/A 01/12/2020   Procedure: ESOPHAGEAL BANDING;  Surgeon: Otis Brace, MD;  [facility];  Service: Gastroenterology;  Laterality: N/A;   ESOPHAGEAL BANDING N/A 09/10/2020   Procedure: ESOPHAGEAL BANDING;  Surgeon: Yetta Flock, MD;  [facility];  Service: Gastroenterology;  Laterality: N/A;   ESOPHAGEAL BANDING  04/16/2021   Procedure: ESOPHAGEAL BANDING;  Surgeon: Sharyn Creamer, MD;  [facility];  Service: Gastroenterology;;   ESOPHAGOGASTRODUODENOSCOPY Left 08/21/2015   Procedure: ESOPHAGOGASTRODUODENOSCOPY (EGD);  Surgeon: Wilford Corner, MD;  Location[facility];  Service: Endoscopy;  Laterality: Left;   ESOPHAGOGASTRODUODENOSCOPY N/A  12/05/2019   Procedure: ESOPHAGOGASTRODUODENOSCOPY (EGD);  Surgeon: Otis Brace, MD;  Location[facility] ENDOSCOPY;  Service: Gastroenterology;  Laterality: N/A;   ESOPHAGOGASTRODUODENOSCOPY (EGD) WITH PROPOFOL N/A 08/30/2019   Procedure: ESOPHAGOGASTRODUODENOSCOPY (EGD) WITH PROPOFOL;  Surgeon: Ronnette Juniper, MD;  Location: [place];  Service: Gastroenterology;  Laterality: N/A;   ESOPHAGOGASTRODUODENOSCOPY (EGD) WITH PROPOFOL N/A 10/10/2019   Procedure: ESOPHAGOGASTRODUODENOSCOPY (EGD) WITH PROPOFOL;  Surgeon: Ronnette Juniper, MD;  Location: [place];  Service: Gastroenterology;  Laterality: N/A;   ESOPHAGOGASTRODUODENOSCOPY (EGD) WITH PROPOFOL N/A 12/23/2019   Procedure: ESOPHAGOGASTRODUODENOSCOPY (EGD) WITH PROPOFOL;  Surgeon: Otis Brace, MD;  [facility];  Service: Gastroenterology;  Laterality: N/A;   ESOPHAGOGASTRODUODENOSCOPY (EGD) WITH PROPOFOL N/A 01/12/2020   Procedure: ESOPHAGOGASTRODUODENOSCOPY (EGD) WITH PROPOFOL;  Surgeon: Otis Brace, MD;  [facility];  Service: Gastroenterology;  Laterality: N/A;   ESOPHAGOGASTRODUODENOSCOPY (EGD) WITH PROPOFOL N/A 09/10/2020   Procedure: ESOPHAGOGASTRODUODENOSCOPY (EGD) WITH PROPOFOL;  Surgeon: Yetta Flock, MD;  [facility];  Service: Gastroenterology;  Laterality: N/A;   ESOPHAGOGASTRODUODENOSCOPY (EGD) WITH PROPOFOL N/A 04/16/2021   Procedure: ESOPHAGOGASTRODUODENOSCOPY (EGD) WITH PROPOFOL;  Surgeon: Sharyn Creamer, MD;  [facility];  Service: Gastroenterology;  Laterality: N/A;   GASTRIC VARICES BANDING  12/05/2019   Procedure: GASTRIC VARICES BANDING;  Surgeon: Otis Brace, MD;  Location: [place];  Service: Gastroenterology;;  placed 6 bands   HOT HEMOSTASIS N/A 12/05/2019   Procedure: HOT HEMOSTASIS (ARGON PLASMA COAGULATION/BICAP);  Surgeon: Otis Brace, MD;  Location: [facility] ENDOSCOPY;  Service: Gastroenterology;  Laterality: N/A;   IR RADIOLOGIST EVAL & MGMT  12/13/2019     Current Facility-Administered Medications  Medication Dose Route Frequency Provider Last Rate Last Admin   0.9 %  sodium chloride infusion   Intravenous Continuous Armbruster, Carlota Raspberry, MD        Allergies as of 05/02/2021 - Review Complete 04/22/2021  Allergen Reaction Noted   Penicillins Hives 05/26/2018    Family History  Problem Relation Age of Onset   Peptic Ulcer Paternal Aunt    Crohn's disease Maternal Uncle    Heart attack Maternal Uncle    Heart attack Maternal Grandfather    Stroke Maternal Grandmother    Stroke Paternal Grandfather     Social History   Socioeconomic History   Marital status: Single    Spouse name: Not on file   Number of children: Not on file   Years of education: Not on file   Highest education level: Not on file  Occupational History   Not on file  Tobacco Use   Smoking status: Former    Packs/day: 1.00    Types: Cigarettes   Smokeless tobacco: Never  Vaping Use   Vaping Use: Never used  Substance and Sexual Activity   Alcohol use: Not Currently    Alcohol/week: 3.0 standard drinks    Types: 3 Standard drinks or equivalent per week    Comment: Alcoholism   Drug use: Yes    Frequency: 7.0 times per week    Types: Marijuana   Sexual activity: Not Currently  Other Topics Concern   Not on file  Social History Narrative   Not on file   Social Determinants of Health   Financial Resource Strain: Not on file  Food Insecurity: Not on file  Transportation Needs: Not on file  Physical Activity: Not on file  Stress: Not on file  Social Connections: Not on file  Intimate Partner Violence: Not on file     Physical Exam:  Constitutional: generally well-appearing Psychiatric: alert and oriented x3 Lungs: CTA bilaterally Heart: no MCR  Assessment and plan: 41 y.o. malereferred by Dr. Havery Moros for repeat EGD, variceal banding.  Last EGD 1 month ago with Dr. Lorenso Courier, 6 bands placed on large esoph varices  EGd today  Care is  appropriate for the ambulatory setting.  Owens Loffler, MD [facility] 05/21/2021, 9:07 AM

## 2021-05-21 NOTE — Transfer of Care (Signed)
Immediate Anesthesia Transfer of Care Note  Patient: [name] [facility]  Procedure(s) Performed: ESOPHAGOGASTRODUODENOSCOPY (EGD) WITH PROPOFOL ESOPHAGEAL BANDING  Patient Location: PACU and Endoscopy Unit  Anesthesia Type:MAC  Level of Consciousness: awake, alert  and patient cooperative  Airway & Oxygen Therapy: Patient Spontanous Breathing and Patient connected to face mask oxygen  Post-op Assessment: Report given to RN and Post -op Vital signs reviewed and stable  Post vital signs: Reviewed and stable  Last Vitals:  Vitals Value Taken Time  BP    Temp    Pulse 74 05/21/21 1039  Resp 22 05/21/21 1039  SpO2 100 % 05/21/21 1039  Vitals shown include unvalidated device data.  Last Pain:  Vitals:   05/21/21 0908  TempSrc: Tympanic  PainSc: 0-No pain         Complications: No notable events documented.

## 2021-05-21 NOTE — Anesthesia Procedure Notes (Signed)
Procedure Name: MAC Date/Time: 05/21/2021 10:09 AM Performed by: Eben Burow, CRNA Pre-anesthesia Checklist: Patient identified, Emergency Drugs available, Suction available, Patient being monitored and Timeout performed Oxygen Delivery Method: Simple face mask Placement Confirmation: positive ETCO2

## 2021-05-21 NOTE — Anesthesia Preprocedure Evaluation (Signed)
Anesthesia Evaluation  Patient identified by MRN, date of birth, ID band Patient awake    Reviewed: Allergy & Precautions, NPO status , Patient's Chart, lab work & pertinent test results  Airway Mallampati: II  TM Distance: >3 FB Neck ROM: Full    Dental  (+) Chipped,    Pulmonary former smoker,    Pulmonary exam normal breath sounds clear to auscultation       Cardiovascular negative cardio ROS Normal cardiovascular exam Rhythm:Regular Rate:Normal     Neuro/Psych Seizures -,  PSYCHIATRIC DISORDERS Anxiety Depression    GI/Hepatic negative GI ROS, (+) Cirrhosis   Esophageal Varices  substance abuse  , Hepatitis -  Endo/Other  negative endocrine ROS  Renal/GU negative Renal ROS     Musculoskeletal negative musculoskeletal ROS (+)   Abdominal   Peds  Hematology negative hematology ROS (+)   Anesthesia Other Findings Cirrhosis Esophageal varices  Reproductive/Obstetrics                             Anesthesia Physical Anesthesia Plan  ASA: 3  Anesthesia Plan: MAC   Post-op Pain Management:    Induction: Intravenous  PONV Risk Score and Plan: 1 and Propofol infusion and Treatment may vary due to age or medical condition  Airway Management Planned: Nasal Cannula  Additional Equipment:   Intra-op Plan:   Post-operative Plan:   Informed Consent: I have reviewed the patients History and Physical, chart, labs and discussed the procedure including the risks, benefits and alternatives for the proposed anesthesia with the patient or authorized representative who has indicated his/her understanding and acceptance.     Dental advisory given  Plan Discussed with: CRNA  Anesthesia Plan Comments:         Anesthesia Quick Evaluation

## 2021-05-21 NOTE — Discharge Instructions (Signed)
YOU HAD AN ENDOSCOPIC PROCEDURE TODAY: Refer to the procedure report and other information in the discharge instructions given to you for any specific questions about what was found during the examination. If this information does not answer your questions, please call [place] office at [phone] to clarify.  ° °YOU SHOULD EXPECT: Some feelings of bloating in the abdomen. Passage of more gas than usual. Walking can help get rid of the air that was put into your GI tract during the procedure and reduce the bloating. If you had a lower endoscopy (such as a colonoscopy or flexible sigmoidoscopy) you may notice spotting of blood in your stool or on the toilet paper. Some abdominal soreness may be present for a day or two, also. ° °DIET: Your first meal following the procedure should be a light meal and then it is ok to progress to your normal diet. A half-sandwich or bowl of soup is an example of a good first meal. Heavy or fried foods are harder to digest and may make you feel nauseous or bloated. Drink plenty of fluids but you should avoid alcoholic beverages for 24 hours. If you had a esophageal dilation, please see attached instructions for diet.   ° °ACTIVITY: Your care partner should take you home directly after the procedure. You should plan to take it easy, moving slowly for the rest of the day. You can resume normal activity the day after the procedure however YOU SHOULD NOT DRIVE, use power tools, machinery or perform tasks that involve climbing or major physical exertion for 24 hours (because of the sedation medicines used during the test).  ° °SYMPTOMS TO REPORT IMMEDIATELY: °A gastroenterologist can be reached at any hour. Please call [phone]  for any of the following symptoms:  °Following lower endoscopy (colonoscopy, flexible sigmoidoscopy) °Excessive amounts of blood in the stool  °Significant tenderness, worsening of abdominal pains  °Swelling of the abdomen that is new, acute  °Fever of 100° or  higher  °Following upper endoscopy (EGD, EUS, ERCP, esophageal dilation) °Vomiting of blood or coffee ground material  °New, significant abdominal pain  °New, significant chest pain or pain under the shoulder blades  °Painful or persistently difficult swallowing  °New shortness of breath  °Black, tarry-looking or red, bloody stools ° °FOLLOW UP:  °If any biopsies were taken you will be contacted by phone or by letter within the next 1-3 weeks. Call [phone]  if you have not heard about the biopsies in 3 weeks.  °Please also call with any specific questions about appointments or follow up tests. ° °

## 2021-05-21 NOTE — Anesthesia Postprocedure Evaluation (Signed)
Anesthesia Post Note  Patient: Jasyn Mey [facility]  Procedure(s) Performed: ESOPHAGOGASTRODUODENOSCOPY (EGD) WITH PROPOFOL ESOPHAGEAL BANDING     Patient location during evaluation: Endoscopy Anesthesia Type: MAC Level of consciousness: awake Pain management: pain level controlled Vital Signs Assessment: post-procedure vital signs reviewed and stable Respiratory status: spontaneous breathing, nonlabored ventilation, respiratory function stable and patient connected to nasal cannula oxygen Cardiovascular status: stable and blood pressure returned to baseline Postop Assessment: no apparent nausea or vomiting Anesthetic complications: no   No notable events documented.  Last Vitals:  Vitals:   05/21/21 1050 05/21/21 1100  BP:    Pulse: 70 77  Resp: (!) 21 14  Temp:    SpO2:      Last Pain:  Vitals:   05/21/21 1100  TempSrc:   PainSc: 0-No pain                 Sherry Rogus P Tong Pieczynski

## 2021-05-21 NOTE — Op Note (Signed)
[facility] Patient Name: James CollumDaniel Best Procedure Date: 05/21/2021 MRN: [id number] Attending MD: Rachael Feeaniel P Saintclair Schroader , MD Date of Birth: [birth year] CSN: [id number] Age: 41 Admit Type: Outpatient Procedure:                Upper GI endoscopy Indications:              Cirrhosis and known esophageal varices. Last EGD                            Dr. Leonides Schanzorsey 03/2021 6 ligating bands placed Providers:                Rachael Feeaniel P. Alexzander Dolinger, MD, Estella HuskJarmila Fucs RN, RN, Priscella MannHaiven                            Houle, Technician Referring MD:             Harlin RainSteve Armbruster, MD Medicines:                Monitored Anesthesia Care Complications:            No immediate complications. Estimated blood loss:                            None. Estimated Blood Loss:     Estimated blood loss: none. Procedure:                Pre-Anesthesia Assessment:                           - Prior to the procedure, a History and Physical                            was performed, and patient medications and                            allergies were reviewed. The patient's tolerance of                            previous anesthesia was also reviewed. The risks                            and benefits of the procedure and the sedation                            options and risks were discussed with the patient.                            All questions were answered, and informed consent                            was obtained. Prior Anticoagulants: The patient has                            taken no previous anticoagulant or antiplatelet  agents. ASA Grade Assessment: IV - A patient with                            severe systemic disease that is a constant threat                            to life. After reviewing the risks and benefits,                            the patient was deemed in satisfactory condition to                            undergo the procedure.                           After  obtaining informed consent, the endoscope was                            passed under direct vision. Throughout the                            procedure, the patient's blood pressure, pulse, and                            oxygen saturations were monitored continuously. The                            [id number]-H190 ([id number]) Olympus endoscope was introduced                            through the mouth, and advanced to the second part                            of duodenum. The upper GI endoscopy was                            accomplished without difficulty. The patient                            tolerated the procedure well. Scope In: Scope Out: Findings:      Several trunks of small to medium sized esophageal varices found in the       distal to mid esophagus. There was scar evidence of previous banding       procedures. I placed 7 ligating bands on the varices with apparent       complete eradication. There was no bleeding during the procedure.      Very mild portal gastropathy in the mid body.      No gastric varices.      The exam was otherwise without abnormality. Impression:               - Several trunks of small to medium sized                            esophageal varices found  in the distal to mid                            esophagus. There was scar evidence of previous                            banding procedures. I placed 7 ligating bands on                            the varices with apparent complete eradication.                            There was no bleeding during the procedure.                           - Very mild portal gastropathy in the mid body.                           - No gastric varices. Moderate Sedation:      Not Applicable - Patient had care per Anesthesia. Recommendation:           - Patient has a contact number available for                            emergencies. The signs and symptoms of potential                            delayed complications were  discussed with the                            patient. Return to normal activities tomorrow.                            Written discharge instructions were provided to the                            patient.                           - Resume previous diet.                           - Continue present medications.                           - Dr. Adela Lank to communicate timing of repeat                            EGD, banding. Procedure Code(s):        --- Professional ---                           [phone], Esophagogastroduodenoscopy, flexible,                            transoral; with band ligation of esophageal/gastric  varices Diagnosis Code(s):        --- Professional ---                           I85.00, Esophageal varices without bleeding CPT copyright 2019 American Medical Association. All rights reserved. The codes documented in this report are preliminary and upon coder review may  be revised to meet current compliance requirements. Rachael Feeaniel P Carlee Vonderhaar, MD 05/21/2021 10:34:52 AM This report has been signed electronically. Number of Addenda: 0

## 2021-05-22 ENCOUNTER — Encounter (HOSPITAL_COMMUNITY): Payer: Self-pay | Admitting: Gastroenterology

## 2021-06-26 ENCOUNTER — Telehealth: Payer: Self-pay

## 2021-06-26 ENCOUNTER — Other Ambulatory Visit: Payer: Self-pay

## 2021-06-26 DIAGNOSIS — I8501 Esophageal varices with bleeding: Secondary | ICD-10-CM

## 2021-06-26 DIAGNOSIS — K7030 Alcoholic cirrhosis of liver without ascites: Secondary | ICD-10-CM

## 2021-06-26 DIAGNOSIS — K7682 Hepatic encephalopathy: Secondary | ICD-10-CM

## 2021-06-26 NOTE — Telephone Encounter (Signed)
Called and spoke to patient.  Offered him 4-6 for his next EGD at [facility] with Dr. Adela Lank. Patient indicates that he is available to have procedure that day. Instructions will be mailed and sent via MyChart. ?

## 2021-06-26 NOTE — Progress Notes (Unsigned)
Patient scheduled for EGD with Banding of varices on Thursday, 4-6 at 9:15am. Instructions have been mailed and sent via Mychart ?

## 2021-07-16 ENCOUNTER — Other Ambulatory Visit: Payer: Self-pay | Admitting: Family

## 2021-07-16 DIAGNOSIS — F419 Anxiety disorder, unspecified: Secondary | ICD-10-CM

## 2021-07-16 DIAGNOSIS — F32A Depression, unspecified: Secondary | ICD-10-CM

## 2021-07-16 NOTE — Telephone Encounter (Signed)
Medication Refill - Medication: sertraline (ZOLOFT) 25 MG tablet ? ?Has the patient contacted their pharmacy? Yes.   ?(Agent: If no, request that the patient contact the pharmacy for the refill. If patient does not wish to contact the pharmacy document the reason why and proceed with request.) ?(Agent: If yes, when and what did the pharmacy advise?) ? ?Preferred Pharmacy (with phone number or street name):  ?[id number] - [place], [place] - [address] AT [facility] OF [address]  ?[address] [place] [address]  ?Phone: [phone] Fax: [phone]  ? ?Has the patient been seen for an appointment in the last year OR does the patient have an upcoming appointment? Yes.   ? ?Agent: Please be advised that RX refills may take up to 3 business days. We ask that you follow-up with your pharmacy. ? ?

## 2021-07-17 NOTE — Telephone Encounter (Signed)
Requested medications are due for refill today.  yes ? ?Requested medications are on the active medications list.  yes ? ?Last refill. 04/16/2021 #30 0 refills ? ?Future visit scheduled.   yes ? ?Notes to clinic.  Courtesy refill already given. Medication refill not delegated. ? ? ? ?Requested Prescriptions  ?Pending Prescriptions Disp Refills  ? sertraline (ZOLOFT) 25 MG tablet 30 tablet 0  ?  ? Not Delegated - Psychiatry:  Antidepressants - SSRI - sertraline Failed - 07/16/2021 11:45 AM  ?  ?  Failed - This refill cannot be delegated  ?  ?  Failed - Valid encounter within last 6 months  ?  Recent Outpatient Visits   ? ?      ? 9 months ago Depression, unspecified depression type  ? Primary Care at [facility], [place], NP  ? 1 year ago Annual physical exam  ? Primary Care at [facility], [place], NP  ? 1 year ago Encounter to establish care  ? Primary Care at [facility], [place], NP  ? 1 year ago Encounter to establish care  ? [facility] Kerin Perna, NP  ? ?  ?  ?Future Appointments   ? ?        ? In 1 week Camillia Herter, NP Primary Care at [facility]  ? ?  ? ?  ?  ?  Passed - AST in normal range and within 360 days  ?  AST  ?Date Value Ref Range Status  ?04/21/2021 41 15 - 41 U/L Final  ?  ?  ?  ?  Passed - ALT in normal range and within 360 days  ?  ALT  ?Date Value Ref Range Status  ?04/21/2021 25 0 - 44 U/L Final  ?  ?  ?  ?  Passed - Completed PHQ-2 or PHQ-9 in the last 360 days  ?  ?  ?  ?

## 2021-07-19 ENCOUNTER — Other Ambulatory Visit: Payer: Self-pay

## 2021-07-19 DIAGNOSIS — I8501 Esophageal varices with bleeding: Secondary | ICD-10-CM

## 2021-07-19 DIAGNOSIS — F419 Anxiety disorder, unspecified: Secondary | ICD-10-CM

## 2021-07-19 DIAGNOSIS — F32A Depression, unspecified: Secondary | ICD-10-CM

## 2021-07-19 MED ORDER — SERTRALINE HCL 25 MG PO TABS: 25.0000 mg | ORAL_TABLET | Freq: Every day | ORAL | 0 refills | Status: DC

## 2021-07-24 ENCOUNTER — Encounter (HOSPITAL_COMMUNITY): Payer: Self-pay | Admitting: Gastroenterology

## 2021-07-24 NOTE — Progress Notes (Signed)
Attempted to obtain medical history via telephone, unable to reach at this time. I left a voicemail to return pre surgical testing department's phone call.  

## 2021-07-28 NOTE — Progress Notes (Signed)
? ? ?Patient ID: [name], male    DOB: 1981/03/07  MRN: [id number] ? ?CC: Anxiety Depression Follow-Up ? ?Subjective: ?[name] is a 41 y.o. male who presents for anxiety depression follow-up.  ? ?His concerns today include:  ?ANXIETY DEPRESSION FOLLOW-UP: ?10/16/2020: ?- Continue Sertraline as prescribed.  ? ?07/30/2021: ?Doing well on current Sertraline and taking as prescribed. However, does not feel working as well as would prefer. Not ready for counseling services.  ? ? ?  07/30/2021  ?  2:36 PM 10/16/2020  ?  2:36 PM 06/18/2020  ?  9:54 AM 05/16/2020  ? 10:50 AM 12/22/2019  ?  9:44 AM  ?Depression screen PHQ 2/9  ?Decreased Interest 1 0 1 3 0  ?Down, Depressed, Hopeless 1 1 0 3 2  ?PHQ - 2 Score 2 1 1 6 2   ?Altered sleeping 0 0 2 0 0  ?Tired, decreased energy 0 0 0 0 1  ?Change in appetite 1 0 2 1 3   ?Feeling bad or failure about yourself  0 1 1 3 1   ?Trouble concentrating 1 1 0 3 1  ?Moving slowly or fidgety/restless 0 1 0 1 2  ?Suicidal thoughts 0 0 0 1 0  ?PHQ-9 Score 4 4 6 15 10   ?Difficult doing work/chores Not difficult at all Not difficult at all     ? ? ? ?Patient Active Problem List  ? Diagnosis Date Noted  ? Gastroesophageal reflux disease   ? Alcoholic hepatitis 05/16/2020  ? Anemia 05/16/2020  ? Encephalopathy, portal systemic (HCC) 05/16/2020  ? Anxiety 05/16/2020  ? Gynecomastia 05/16/2020  ? 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  ? 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/16/2020  ? Esophageal varices with bleeding (HCC) 05/16/2020  ? Encephalopathy 05/16/2020  ? GI bleed 10/09/2019  ? Hematemesis 10/09/2019  ? Leukocytosis 10/09/2019  ? Elevated liver enzymes 10/09/2019  ? Elevated lactic acid level 09/02/2019  ? Bleeding esophageal varices (HCC) 09/02/2019  ? Shock (HCC) 08/30/2019  ? Electrolyte disturbance 07/16/2019  ? Lactic acidosis 07/16/2019  ? Hyperammonemia (HCC) 07/16/2019  ? Hyperbilirubinemia 07/16/2019  ? Alcoholic cirrhosis of liver without ascites (HCC) 07/24/2016  ? Acute  encephalopathy   ? Acute upper GI bleed   ? Ascites   ? Distended abdomen   ? Hemorrhagic shock (HCC)   ? Encounter for palliative care   ? Goals of care, counseling/discussion   ? Encephalopathy acute 08/27/2015  ? Alcoholic cirrhosis (HCC)   ? Acute hypoxemic respiratory failure (HCC)   ? SBP (spontaneous bacterial peritonitis) (HCC)   ? Hypocalcemia 08/21/2015  ? Hypomagnesemia 08/21/2015  ? Acute blood loss anemia 08/20/2015  ? Alcohol abuse 08/20/2015  ? Elevated INR 08/20/2015  ? Hypoalbuminemia 08/20/2015  ? Hypokalemia 08/20/2015  ?  ? ?Current Outpatient Medications on File Prior to Visit  ?Medication Sig Dispense Refill  ? Ferrous Sulfate (IRON) 325 (65 Fe) MG TABS Take 1 tablet (325 mg total) by mouth daily. 20 tablet 0  ? folic acid (FOLVITE) 400 MCG tablet Take 400 mcg by mouth daily.    ? hydrocortisone cream 1 % Apply 1 application topically daily as needed for itching.    ? lactulose (CHRONULAC) 10 GM/15ML solution Take 22.5 mLs by mouth in the morning and at bedtime.    ? Magnesium 500 MG TABS Take 500 mg by mouth in the morning and at bedtime.    ? Multiple Vitamin (MULTIVITAMIN WITH MINERALS) TABS tablet Take 1 tablet by mouth daily.    ?  nadolol (CORGARD) 20 MG tablet TAKE 1 TABLET (20 MG TOTAL) BY MOUTH DAILY. 90 tablet 3  ? oxymetazoline (AFRIN) 0.05 % nasal spray Place 1 spray into both nostrils 2 (two) times daily as needed for congestion.    ? thiamine (VITAMIN B-1) 100 MG tablet Take 100 mg by mouth daily.    ? ?No current facility-administered medications on file prior to visit.  ? ? ?Allergies  ?Allergen Reactions  ? Penicillins Hives  ?  Infant  ? ? ?Social History  ? ?Socioeconomic History  ? Marital status: Single  ?  Spouse name: Not on file  ? Number of children: Not on file  ? Years of education: Not on file  ? Highest education level: Not on file  ?Occupational History  ? Not on file  ?Tobacco Use  ? Smoking status: Former  ?  Packs/day: 1.00  ?  Types: Cigarettes  ? Smokeless  tobacco: Never  ?Vaping Use  ? Vaping Use: Never used  ?Substance and Sexual Activity  ? Alcohol use: Not Currently  ?  Alcohol/week: 3.0 standard drinks  ?  Types: 3 Standard drinks or equivalent per week  ?  Comment: Alcoholism  ? Drug use: Yes  ?  Frequency: 7.0 times per week  ?  Types: Marijuana  ? Sexual activity: Not Currently  ?Other Topics Concern  ? Not on file  ?Social History Narrative  ? Not on file  ? ?Social Determinants of Health  ? ?Financial Resource Strain: Not on file  ?Food Insecurity: Not on file  ?Transportation Needs: Not on file  ?Physical Activity: Not on file  ?Stress: Not on file  ?Social Connections: Not on file  ?Intimate Partner Violence: Not on file  ? ? ?Family History  ?Problem Relation Age of Onset  ? Peptic Ulcer Paternal Aunt   ? Crohn's disease Maternal Uncle   ? Heart attack Maternal Uncle   ? Heart attack Maternal Grandfather   ? Stroke Maternal Grandmother   ? Stroke Paternal Grandfather   ? ? ?Past Surgical History:  ?Procedure Laterality Date  ? ESOPHAGEAL BANDING  08/30/2019  ? Procedure: ESOPHAGEAL BANDING;  Surgeon: Kerin Salen, MD;  Location: [facility] ENDOSCOPY;  Service: Gastroenterology;;  ? ESOPHAGEAL BANDING  10/10/2019  ? Procedure: ESOPHAGEAL BANDING;  Surgeon: Kerin Salen, MD;  Location: [facility] ENDOSCOPY;  Service: Gastroenterology;;  ? ESOPHAGEAL BANDING N/A 12/23/2019  ? Procedure: ESOPHAGEAL BANDING;  Surgeon: Kathi Der, MD;  Location: [facility] ENDOSCOPY;  Service: Gastroenterology;  Laterality: N/A;  ? ESOPHAGEAL BANDING N/A 01/12/2020  ? Procedure: ESOPHAGEAL BANDING;  Surgeon: Kathi Der, MD;  Location: [facility] ENDOSCOPY;  Service: Gastroenterology;  Laterality: N/A;  ? ESOPHAGEAL BANDING N/A 09/10/2020  ? Procedure: ESOPHAGEAL BANDING;  Surgeon: Benancio Deeds, MD;  Location: [facility] ENDOSCOPY;  Service: Gastroenterology;  Laterality: N/A;  ? ESOPHAGEAL BANDING  04/16/2021  ? Procedure: ESOPHAGEAL BANDING;  Surgeon: Imogene Burn, MD;  Location: [facility] ENDOSCOPY;   Service: Gastroenterology;;  ? ESOPHAGEAL BANDING N/A 05/21/2021  ? Procedure: ESOPHAGEAL BANDING;  Surgeon: Rachael Fee, MD;  Location: [facility] ENDOSCOPY;  Service: Endoscopy;  Laterality: N/A;  ? ESOPHAGOGASTRODUODENOSCOPY Left 08/21/2015  ? Procedure: ESOPHAGOGASTRODUODENOSCOPY (EGD);  Surgeon: Charlott Rakes, MD;  Location: [facility] ENDOSCOPY;  Service: Endoscopy;  Laterality: Left;  ? ESOPHAGOGASTRODUODENOSCOPY N/A 12/05/2019  ? Procedure: ESOPHAGOGASTRODUODENOSCOPY (EGD);  Surgeon: Kathi Der, MD;  Location: [facility] ENDOSCOPY;  Service: Gastroenterology;  Laterality: N/A;  ? ESOPHAGOGASTRODUODENOSCOPY (EGD) WITH PROPOFOL N/A 08/30/2019  ? Procedure: ESOPHAGOGASTRODUODENOSCOPY (EGD) WITH PROPOFOL;  Surgeon: Kerin SalenKarki, Arya, MD;  [facility];  Service: Gastroenterology;  Laterality: N/A;  ? ESOPHAGOGASTRODUODENOSCOPY (EGD) WITH PROPOFOL N/A 10/10/2019  ? Procedure: ESOPHAGOGASTRODUODENOSCOPY (EGD) WITH PROPOFOL;  Surgeon: Kerin SalenKarki, Arya, MD;  [facility];  Service: Gastroenterology;  Laterality: N/A;  ? ESOPHAGOGASTRODUODENOSCOPY (EGD) WITH PROPOFOL N/A 12/23/2019  ? Procedure: ESOPHAGOGASTRODUODENOSCOPY (EGD) WITH PROPOFOL;  Surgeon: Kathi DerBrahmbhatt, Parag, MD;  Location: [facility] ENDOSCOPY;  Service: Gastroenterology;  Laterality: N/A;  ? ESOPHAGOGASTRODUODENOSCOPY (EGD) WITH PROPOFOL N/A 01/12/2020  ? Procedure: ESOPHAGOGASTRODUODENOSCOPY (EGD) WITH PROPOFOL;  Surgeon: Kathi DerBrahmbhatt, Parag, MD;  Location: [facility] ENDOSCOPY;  Service: Gastroenterology;  Laterality: N/A;  ? ESOPHAGOGASTRODUODENOSCOPY (EGD) WITH PROPOFOL N/A 09/10/2020  ? Procedure: ESOPHAGOGASTRODUODENOSCOPY (EGD) WITH PROPOFOL;  Surgeon: Benancio DeedsArmbruster, Steven P, MD;  Location: [facility] ENDOSCOPY;  Service: Gastroenterology;  Laterality: N/A;  ? ESOPHAGOGASTRODUODENOSCOPY (EGD) WITH PROPOFOL N/A 04/16/2021  ? Procedure: ESOPHAGOGASTRODUODENOSCOPY (EGD) WITH PROPOFOL;  Surgeon: Imogene Burnorsey, Ying C, MD;  Location: [facility] ENDOSCOPY;  Service: Gastroenterology;  Laterality: N/A;  ?  ESOPHAGOGASTRODUODENOSCOPY (EGD) WITH PROPOFOL N/A 05/21/2021  ? Procedure: ESOPHAGOGASTRODUODENOSCOPY (EGD) WITH PROPOFOL;  Surgeon: Rachael FeeJacobs, Deni P, MD;  Location: [facility] ENDOSCOPY;  Service: Endoscopy;  Lora PaulaLatera

## 2021-07-29 ENCOUNTER — Telehealth: Payer: Self-pay

## 2021-07-29 DIAGNOSIS — K7030 Alcoholic cirrhosis of liver without ascites: Secondary | ICD-10-CM

## 2021-07-29 NOTE — Telephone Encounter (Signed)
-----   Message from Cooper Render, CMA sent at 03/12/2021  9:30 AM EST ----- ?Regarding: RUQ U/S ?Patient will be due for RUQ U/S for hcc screening around 4-24 ? ?

## 2021-07-29 NOTE — Telephone Encounter (Signed)
Order placed for liver ultrasound. MyChart Message sent to patient with appointment information.  Scheduled for  ?Monday, 4-24 at 8:00am, to arr 7:45am at [facility], [place] after midnight. ?

## 2021-07-30 ENCOUNTER — Other Ambulatory Visit: Payer: Self-pay

## 2021-07-30 ENCOUNTER — Ambulatory Visit (INDEPENDENT_AMBULATORY_CARE_PROVIDER_SITE_OTHER): Payer: Medicare Other | Admitting: Family

## 2021-07-30 VITALS — BP 124/81 | HR 85 | Temp 98.3°F | Resp 18 | Ht 67.01 in | Wt 145.0 lb

## 2021-07-30 DIAGNOSIS — F32A Depression, unspecified: Secondary | ICD-10-CM

## 2021-07-30 DIAGNOSIS — F419 Anxiety disorder, unspecified: Secondary | ICD-10-CM

## 2021-07-30 DIAGNOSIS — I8501 Esophageal varices with bleeding: Secondary | ICD-10-CM

## 2021-07-30 MED ORDER — SERTRALINE HCL 50 MG PO TABS: 50.0000 mg | ORAL_TABLET | Freq: Every day | ORAL | 2 refills | Status: DC

## 2021-07-30 MED ORDER — PANTOPRAZOLE SODIUM 40 MG PO TBEC: 40.0000 mg | DELAYED_RELEASE_TABLET | Freq: Two times a day (BID) | ORAL | 1 refills | Status: DC

## 2021-07-30 NOTE — Progress Notes (Signed)
Pt presents for anxiety/depression follow-up, pt feels he needs to increase dosage on Zoloft  ?

## 2021-07-30 NOTE — Progress Notes (Signed)
Refill of pantoprazole sent to pharmacy

## 2021-08-01 ENCOUNTER — Encounter (HOSPITAL_COMMUNITY)
Admission: RE | Disposition: A | Discharge status: Home / Self Care | Payer: Self-pay | Source: Ambulatory Visit | Attending: Gastroenterology

## 2021-08-01 ENCOUNTER — Ambulatory Visit (HOSPITAL_BASED_OUTPATIENT_CLINIC_OR_DEPARTMENT_OTHER): Payer: Medicare Other | Admitting: Anesthesiology

## 2021-08-01 ENCOUNTER — Encounter (HOSPITAL_COMMUNITY): Payer: Self-pay | Admitting: Gastroenterology

## 2021-08-01 ENCOUNTER — Other Ambulatory Visit: Payer: Self-pay

## 2021-08-01 ENCOUNTER — Ambulatory Visit (HOSPITAL_COMMUNITY)
Admission: RE | Admit: 2021-08-01 | Discharge: 2021-08-01 | Disposition: A | Discharge status: Home / Self Care | Payer: Medicare Other | Source: Ambulatory Visit | Attending: Gastroenterology | Admitting: Gastroenterology

## 2021-08-01 ENCOUNTER — Ambulatory Visit (HOSPITAL_COMMUNITY): Payer: Medicare Other | Admitting: Anesthesiology

## 2021-08-01 DIAGNOSIS — K7682 Hepatic encephalopathy: Secondary | ICD-10-CM

## 2021-08-01 DIAGNOSIS — K766 Portal hypertension: Secondary | ICD-10-CM | POA: Diagnosis not present

## 2021-08-01 DIAGNOSIS — K219 Gastro-esophageal reflux disease without esophagitis: Secondary | ICD-10-CM | POA: Diagnosis not present

## 2021-08-01 DIAGNOSIS — F419 Anxiety disorder, unspecified: Secondary | ICD-10-CM | POA: Insufficient documentation

## 2021-08-01 DIAGNOSIS — F32A Depression, unspecified: Secondary | ICD-10-CM | POA: Diagnosis not present

## 2021-08-01 DIAGNOSIS — I8510 Secondary esophageal varices without bleeding: Secondary | ICD-10-CM

## 2021-08-01 DIAGNOSIS — Z87891 Personal history of nicotine dependence: Secondary | ICD-10-CM | POA: Diagnosis not present

## 2021-08-01 DIAGNOSIS — K3189 Other diseases of stomach and duodenum: Secondary | ICD-10-CM | POA: Diagnosis not present

## 2021-08-01 DIAGNOSIS — B1920 Unspecified viral hepatitis C without hepatic coma: Secondary | ICD-10-CM

## 2021-08-01 DIAGNOSIS — K7030 Alcoholic cirrhosis of liver without ascites: Secondary | ICD-10-CM

## 2021-08-01 DIAGNOSIS — K765 Hepatic veno-occlusive disease: Secondary | ICD-10-CM | POA: Diagnosis not present

## 2021-08-01 DIAGNOSIS — I8501 Esophageal varices with bleeding: Secondary | ICD-10-CM

## 2021-08-01 DIAGNOSIS — I8500 Esophageal varices without bleeding: Secondary | ICD-10-CM | POA: Diagnosis present

## 2021-08-01 DIAGNOSIS — K7460 Unspecified cirrhosis of liver: Secondary | ICD-10-CM | POA: Insufficient documentation

## 2021-08-01 HISTORY — PX: ESOPHAGOGASTRODUODENOSCOPY (EGD) WITH PROPOFOL: SHX5813

## 2021-08-01 SURGERY — ESOPHAGOGASTRODUODENOSCOPY (EGD) WITH PROPOFOL
Anesthesia: Monitor Anesthesia Care

## 2021-08-01 MED ORDER — PROPOFOL 10 MG/ML IV BOLUS
INTRAVENOUS | Status: DC | PRN
  Administered 2021-08-01: 30 mg via INTRAVENOUS
  Administered 2021-08-01: 40 mg via INTRAVENOUS

## 2021-08-01 MED ORDER — SODIUM CHLORIDE 0.9 % IV SOLN: INTRAVENOUS | Status: DC

## 2021-08-01 MED ORDER — PROPOFOL 500 MG/50ML IV EMUL
INTRAVENOUS | Status: DC | PRN
  Administered 2021-08-01: 200 ug/kg/min via INTRAVENOUS

## 2021-08-01 MED ORDER — LACTATED RINGERS IV SOLN
INTRAVENOUS | Status: DC
  Administered 2021-08-01: 1000 mL via INTRAVENOUS

## 2021-08-01 MED ORDER — LIDOCAINE 2% (20 MG/ML) 5 ML SYRINGE
INTRAMUSCULAR | Status: DC | PRN
  Administered 2021-08-01: 100 mg via INTRAVENOUS

## 2021-08-01 SURGICAL SUPPLY — 15 items

## 2021-08-01 NOTE — Anesthesia Preprocedure Evaluation (Signed)
Anesthesia Evaluation  ?Patient identified by MRN, date of birth, ID band ?Patient awake ? ? ? ?Reviewed: ?Allergy & Precautions, NPO status , Patient's Chart, lab work & pertinent test results ? ?Airway ?Mallampati: II ? ?TM Distance: >3 FB ?Neck ROM: Full ? ? ? Dental ?no notable dental hx. ? ?  ?Pulmonary ?neg pulmonary ROS, former smoker,  ?  ?Pulmonary exam normal ?breath sounds clear to auscultation ? ? ? ? ? ? Cardiovascular ?negative cardio ROS ?Normal cardiovascular exam ?Rhythm:Regular Rate:Normal ? ? ?  ?Neuro/Psych ?negative neurological ROS ? negative psych ROS  ? GI/Hepatic ?negative GI ROS, (+) Cirrhosis  ? Esophageal Varices ? substance abuse ? , Hepatitis -, C  ?Endo/Other  ?negative endocrine ROS ? Renal/GU ?negative Renal ROS  ?negative genitourinary ?  ?Musculoskeletal ?negative musculoskeletal ROS ?(+)  ? Abdominal ?  ?Peds ?negative pediatric ROS ?(+)  Hematology ?negative hematology ROS ?(+)   ?Anesthesia Other Findings ? ? Reproductive/Obstetrics ?negative OB ROS ? ?  ? ? ? ? ? ? ? ? ? ? ? ? ? ?  ?  ? ? ? ? ? ? ? ? ?Anesthesia Physical ?Anesthesia Plan ? ?ASA: 3 ? ?Anesthesia Plan: MAC  ? ?Post-op Pain Management: Minimal or no pain anticipated  ? ?Induction: Intravenous ? ?PONV Risk Score and Plan: 1 and Propofol infusion and Treatment may vary due to age or medical condition ? ?Airway Management Planned: Simple Face Mask ? ?Additional Equipment:  ? ?Intra-op Plan:  ? ?Post-operative Plan:  ? ?Informed Consent: I have reviewed the patients History and Physical, chart, labs and discussed the procedure including the risks, benefits and alternatives for the proposed anesthesia with the patient or authorized representative who has indicated his/her understanding and acceptance.  ? ? ? ?Dental advisory given ? ?Plan Discussed with: CRNA and Surgeon ? ?Anesthesia Plan Comments:   ? ? ? ? ? ? ?Anesthesia Quick Evaluation ? ?

## 2021-08-01 NOTE — Op Note (Signed)
[facility] ?Patient Name: [name] ?Procedure Date: 08/01/2021 ?MRN: [id number] ?Attending MD: Carlota Raspberry. Havery Moros , MD ?Date of Birth: [birth year] ?CSN: [id number] ?Age: 41 ?Admit Type: Outpatient ?Procedure:                Upper GI endoscopy ?Indications:              cirrhosis with history of variceal bleeding in the  ?                          past - multiple EGDs to eradicate varices - 20  ?                          bands placed since May 2022, last exam 04/2021. EGD  ?                          to survey esophageal varices and treat if needed ?Providers:                Carlota Raspberry. Havery Moros, MD, Ladoris Gene, RN, Cindee Salt  ?                          Teaching laboratory technician ?Referring MD:              ?Medicines:                Monitored Anesthesia Care ?Complications:            No immediate complications. Estimated blood loss:  ?                          None. ?Estimated Blood Loss:     Estimated blood loss: none. ?Procedure:                Pre-Anesthesia Assessment: ?                          - Prior to the procedure, a History and Physical  ?                          was performed, and patient medications and  ?                          allergies were reviewed. The patient's tolerance of  ?                          previous anesthesia was also reviewed. The risks  ?                          and benefits of the procedure and the sedation  ?                          options and risks were discussed with the patient.  ?                          All questions were answered, and informed consent  ?  was obtained. Prior Anticoagulants: The patient has  ?                          taken no previous anticoagulant or antiplatelet  ?                          agents. ASA Grade Assessment: III - A patient with  ?                          severe systemic disease. After reviewing the risks  ?                          and benefits, the patient was deemed in  ?                           satisfactory condition to undergo the procedure. ?                          After obtaining informed consent, the endoscope was  ?                          passed under direct vision. Throughout the  ?                          procedure, the patient's blood pressure, pulse, and  ?                          oxygen saturations were monitored continuously. The  ?                          GIF-H190 ([id number]) Olympus endoscope was introduced  ?                          through the mouth, and advanced to the second part  ?                          of duodenum. The upper GI endoscopy was  ?                          accomplished without difficulty. The patient  ?                          tolerated the procedure well. ?Scope In: ?Scope Out: ?Findings: ?     The Z-line was regular and was found 36 cm from the incisors. ?     Grade I varices were found in the middle third of the esophagus and in  ?     the lower third of the esophagus. They were small in size and flattened  ?     with insufflation. Significantly improved over the past few exams. No  ?     varices amenable to banding today. ?     The exam of the esophagus was otherwise normal. ?     Mild portal hypertensive gastropathy was found in the gastric fundus and  ?     in  the gastric body. ?     The exam of the stomach was otherwise normal. No gastric varices ?     The duodenal bulb and second portion of the duodenum were normal. ?Impression:               - Z-line regular, 36 cm from the incisors. ?                          - Grade I esophageal varices flattened with  ?                          insufflation - no high risk varices, significant  ?                          improvement, no banding done today ?                          - Portal hypertensive gastropathy. ?                          - No gastric varices ?                          - Normal stomach otherwise ?                          - Normal duodenal bulb and second portion of the  ?                           duodenum. ?Moderate Sedation: ?     No moderate sedation, case performed with MAC ?Recommendation:           - Patient has a contact number available for  ?                          emergencies. The signs and symptoms of potential  ?                          delayed complications were discussed with the  ?                          patient. Return to normal activities tomorrow.  ?                          Written discharge instructions were provided to the  ?                          patient. ?                          - Resume previous diet. ?                          - Continue present medications. ?                          - Repeat upper endoscopy in 6 months for  ?  surveillance of varices. ?Procedure Code(s):        --- Professional --- ?                          [phone], Esophagogastroduodenoscopy, flexible,  ?                          transoral; diagnostic, including collection of  ?                          specimen(s) by brushing or washing, when performed  ?                          (separate procedure) ?Diagnosis Code(s):        --- Professional --- ?                          I85.00, Esophageal varices without bleeding ?                          K76.6, Portal hypertension ?                          K31.89, Other diseases of stomach and duodenum ?CPT copyright 2019 [employer] Association. All rights reserved. ?The codes documented in this report are preliminary and upon coder review may  ?be revised to meet current compliance requirements. ?Carlota Raspberry. Theadore Blunck, MD ?08/01/2021 9:30:20 AM ?This report has been signed electronically. ?Number of Addenda: 0 ?

## 2021-08-01 NOTE — H&P (Signed)
[facility] History and Physical ? ? ?Primary Care Physician:  Rema Fendt, NP ? ? ?Reason for Procedure:   Esophageal varices / cirrhosis ? ?Plan:    EGD with band ligation of varices ? ? ? ? ?HPI: [name] is a 41 y.o. male  here for EGD to re-evaluate esophageal varices. History of bleeding varices, has had multiple EGDs with band ligation in the past, the last in January. Stopped drinking alcohol, doing much better, making progress on the varices. On nadolol. Otherwise feels well without any cardiopulmonary symptoms.  ? ?I have discussed risks / benefits of EGD and banding, anesthesia with him, he wishes to proceed. ? ?Past Medical History:  ?Diagnosis Date  ? Anxiety   ? Phreesia 05/14/2020  ? Blood transfusion without reported diagnosis   ? [employer]reesia 05/14/2020  ? Cirrhosis of liver (HCC)   ? Depression   ? [employer]reesia 05/14/2020  ? Esophageal varices with hemorrhage (HCC)   ? ETOH abuse   ? GERD (gastroesophageal reflux disease)   ? Seizures (HCC)   ? Phreesia 05/14/2020  ? Substance abuse (HCC)   ? [employer]reesia 05/14/2020  ? ? ?Past Surgical History:  ?Procedure Laterality Date  ? ESOPHAGEAL BANDING  08/30/2019  ? Procedure: ESOPHAGEAL BANDING;  Surgeon: Kerin Salen, MD;  Location: [facility] ENDOSCOPY;  Service: Gastroenterology;;  ? ESOPHAGEAL BANDING  10/10/2019  ? Procedure: ESOPHAGEAL BANDING;  Surgeon: Kerin Salen, MD;  Location: [facility] ENDOSCOPY;  Service: Gastroenterology;;  ? ESOPHAGEAL BANDING N/A 12/23/2019  ? Procedure: ESOPHAGEAL BANDING;  Surgeon: Kathi Der, MD;  Location: [facility] ENDOSCOPY;  Service: Gastroenterology;  Laterality: N/A;  ? ESOPHAGEAL BANDING N/A 01/12/2020  ? Procedure: ESOPHAGEAL BANDING;  Surgeon: Kathi Der, MD;  Location: [facility] ENDOSCOPY;  Service: Gastroenterology;  Laterality: N/A;  ? ESOPHAGEAL BANDING N/A 09/10/2020  ? Procedure: ESOPHAGEAL BANDING;  Surgeon: Benancio Deeds, MD;  Location: [facility] ENDOSCOPY;  Service: Gastroenterology;  Laterality: N/A;  ?  ESOPHAGEAL BANDING  04/16/2021  ? Procedure: ESOPHAGEAL BANDING;  Surgeon: Imogene Burn, MD;  Location: [facility] ENDOSCOPY;  Service: Gastroenterology;;  ? ESOPHAGEAL BANDING N/A 05/21/2021  ? Procedure: ESOPHAGEAL BANDING;  Surgeon: Rachael Fee, MD;  Location: [facility] ENDOSCOPY;  Service: Endoscopy;  Laterality: N/A;  ? ESOPHAGOGASTRODUODENOSCOPY Left 08/21/2015  ? Procedure: ESOPHAGOGASTRODUODENOSCOPY (EGD);  Surgeon: Charlott Rakes, MD;  Location: [facility] ENDOSCOPY;  Service: Endoscopy;  Laterality: Left;  ? ESOPHAGOGASTRODUODENOSCOPY N/A 12/05/2019  ? Procedure: ESOPHAGOGASTRODUODENOSCOPY (EGD);  Surgeon: Kathi Der, MD;  Location: [facility] ENDOSCOPY;  Service: Gastroenterology;  Laterality: N/A;  ? ESOPHAGOGASTRODUODENOSCOPY (EGD) WITH PROPOFOL N/A 08/30/2019  ? Procedure: ESOPHAGOGASTRODUODENOSCOPY (EGD) WITH PROPOFOL;  Surgeon: Kerin Salen, MD;  Location: [facility] ENDOSCOPY;  Service: Gastroenterology;  Laterality: N/A;  ? ESOPHAGOGASTRODUODENOSCOPY (EGD) WITH PROPOFOL N/A 10/10/2019  ? Procedure: ESOPHAGOGASTRODUODENOSCOPY (EGD) WITH PROPOFOL;  Surgeon: Kerin Salen, MD;  Location: [facility] ENDOSCOPY;  Service: Gastroenterology;  Laterality: N/A;  ? ESOPHAGOGASTRODUODENOSCOPY (EGD) WITH PROPOFOL N/A 12/23/2019  ? Procedure: ESOPHAGOGASTRODUODENOSCOPY (EGD) WITH PROPOFOL;  Surgeon: Kathi Der, MD;  Location: [facility] ENDOSCOPY;  Service: Gastroenterology;  Laterality: N/A;  ? ESOPHAGOGASTRODUODENOSCOPY (EGD) WITH PROPOFOL N/A 01/12/2020  ? Procedure: ESOPHAGOGASTRODUODENOSCOPY (EGD) WITH PROPOFOL;  Surgeon: Kathi Der, MD;  Location: [facility] ENDOSCOPY;  Service: Gastroenterology;  Laterality: N/A;  ? ESOPHAGOGASTRODUODENOSCOPY (EGD) WITH PROPOFOL N/A 09/10/2020  ? Procedure: ESOPHAGOGASTRODUODENOSCOPY (EGD) WITH PROPOFOL;  Surgeon: Benancio Deeds, MD;  Location: [facility] ENDOSCOPY;  Service: Gastroenterology;  Laterality: N/A;  ? ESOPHAGOGASTRODUODENOSCOPY (EGD) WITH PROPOFOL N/A 04/16/2021  ? Procedure: ESOPHAGOGASTRODUODENOSCOPY  (EGD) WITH PROPOFOL;  Surgeon: Imogene Burnorsey, Ying C, MD;  [facility];  Service: Gastroenterology;  Laterality: N/A;  ? ESOPHAGOGASTRODUODENOSCOPY (EGD) WITH PROPOFOL N/A 05/21/2021  ? Procedure: ESOPHAGOGASTRODUODENOSCOPY (EGD) WITH PROPOFOL;  Surgeon: Rachael FeeJacobs, Jhoel P, MD;  Location: [facility] ENDOSCOPY;  Service: Endoscopy;  Laterality: N/A;  ? GASTRIC VARICES BANDING  12/05/2019  ? Procedure: GASTRIC VARICES BANDING;  Surgeon: Kathi DerBrahmbhatt, Parag, MD;  Location: [facility] ENDOSCOPY;  Service: Gastroenterology;;  placed 6 bands  ? HOT HEMOSTASIS N/A 12/05/2019  ? Procedure: HOT HEMOSTASIS (ARGON PLASMA COAGULATION/BICAP);  Surgeon: Kathi DerBrahmbhatt, Parag, MD;  [facility];  Service: Gastroenterology;  Laterality: N/A;  ? IR RADIOLOGIST EVAL & MGMT  12/13/2019  ? ? ?Prior to Admission medications   ?Medication Sig Start Date End Date Taking? Authorizing Provider  ?Ferrous Sulfate (IRON) 325 (65 Fe) MG TABS Take 1 tablet (325 mg total) by mouth daily. 09/02/19  Yes Regalado, Belkys A, MD  ?folic acid (FOLVITE) 400 MCG tablet Take 400 mcg by mouth daily.   Yes [provider]  ?hydrocortisone cream 1 % Apply 1 application topically daily as needed for itching.   Yes [provider]  ?lactulose (CHRONULAC) 10 GM/15ML solution Take 22.5 mLs by mouth in the morning and at bedtime. 04/24/21  Yes [provider]  ?Magnesium 500 MG TABS Take 500 mg by mouth in the morning and at bedtime.   Yes [provider]  ?Multiple Vitamin (MULTIVITAMIN WITH MINERALS) TABS tablet Take 1 tablet by mouth daily. 07/19/19  Yes Johnson, Clanford L, MD  ?nadolol (CORGARD) 20 MG tablet TAKE 1 TABLET (20 MG TOTAL) BY MOUTH DAILY. 03/12/21  Yes Omarius Grantham, Willaim RayasSteven P, MD  ?oxymetazoline (AFRIN) 0.05 % nasal spray Place 1 spray into both nostrils 2 (two) times daily as needed for congestion.   Yes [provider]  ?pantoprazole (PROTONIX) 40 MG tablet Take 1 tablet (40 mg total) by mouth 2 (two) times daily before  a meal. 07/30/21  Yes Daissy Yerian, Willaim RayasSteven P, MD  ?sertraline (ZOLOFT) 50 MG tablet Take 1 tablet (50 mg total) by mouth daily. 07/30/21 10/28/21 Yes Zonia KiefStephens, Amy J, NP  ?thiamine (VITAMIN B-1) 100 MG tablet Take 100 mg by mouth daily.   Yes [provider]  ? ? ?Current Facility-Administered Medications  ?Medication Dose Route Frequency Provider Last Rate Last Admin  ? 0.9 %  sodium chloride infusion   Intravenous Continuous Gamble Enderle, Willaim RayasSteven P, MD      ? lactated ringers infusion   Intravenous Continuous Colin Norment, Willaim RayasSteven P, MD 10 mL/hr at 08/01/21 0846 1,000 mL at 08/01/21 0846  ? ? ?Allergies as of 06/26/2021 - Review Complete 05/21/2021  ?Allergen Reaction Noted  ? Penicillins Hives 05/26/2018  ? ? ?Family History  ?Problem Relation Age of Onset  ? Peptic Ulcer Paternal Aunt   ? Crohn's disease Maternal Uncle   ? Heart attack Maternal Uncle   ? Heart attack Maternal Grandfather   ? Stroke Maternal Grandmother   ? Stroke Paternal Grandfather   ? ? ?Social History  ? ?Socioeconomic History  ? Marital status: Single  ?  Spouse name: Not on file  ? Number of children: Not on file  ? Years of education: Not on file  ? Highest education level: Not on file  ?Occupational History  ? Not on file  ?Tobacco Use  ? Smoking status: Former  ?  Packs/day: 1.00  ?  Types: Cigarettes  ? Smokeless tobacco: Never  ?Vaping Use  ? Vaping Use: Never used  ?Substance and Sexual  Activity  ? Alcohol use: Not Currently  ?  Alcohol/week: 3.0 standard drinks  ?  Types: 3 Standard drinks or equivalent per week  ?  Comment: Alcoholism  ? Drug use: Yes  ?  Frequency: 7.0 times per week  ?  Types: Marijuana  ? Sexual activity: Not Currently  ?Other Topics Concern  ? Not on file  ?Social History Narrative  ? Not on file  ? ?Social Determinants of Health  ? ?Financial Resource Strain: Not on file  ?Food Insecurity: Not on file  ?Transportation Needs: Not on file  ?Physical Activity: Not on file  ?Stress: Not on file  ?Social Connections:  Not on file  ?Intimate Partner Violence: Not on file  ? ? ?Review of Systems: ?All other review of systems negative except as mentioned in the HPI. ? ?Physical Exam: ?Vital signs ?BP 121/78   Pulse 75   Temp 98

## 2021-08-01 NOTE — Transfer of Care (Signed)
Immediate Anesthesia Transfer of Care Note ? ?Patient: [name] [facility] ? ?Procedure(s) Performed: ESOPHAGOGASTRODUODENOSCOPY (EGD) WITH PROPOFOL ? ?Patient Location: Endoscopy Unit ? ?Anesthesia Type:MAC ? ?Level of Consciousness: awake, alert  and oriented ? ?Airway & Oxygen Therapy: Patient Spontanous Breathing and Patient connected to face mask oxygen ? ?Post-op Assessment: Report given to RN and Post -op Vital signs reviewed and stable ? ?Post vital signs: Reviewed and stable ? ?Last Vitals:  ?Vitals Value Taken Time  ?BP 107/65   ?Temp    ?Pulse 91 08/01/21 0928  ?Resp 20 08/01/21 0928  ?SpO2 100 % 08/01/21 0928  ?Vitals shown include unvalidated device data. ? ?Last Pain:  ?Vitals:  ? 08/01/21 0830  ?TempSrc: Oral  ?PainSc: 0-No pain  ?   ? ?  ? ?Complications: No notable events documented. ?

## 2021-08-01 NOTE — Anesthesia Postprocedure Evaluation (Signed)
Anesthesia Post Note ? ?Patient: [name] [facility] ? ?Procedure(s) Performed: ESOPHAGOGASTRODUODENOSCOPY (EGD) WITH PROPOFOL ? ?  ? ?Patient location during evaluation: PACU ?Anesthesia Type: MAC ?Level of consciousness: awake and alert ?Pain management: pain level controlled ?Vital Signs Assessment: post-procedure vital signs reviewed and stable ?Respiratory status: spontaneous breathing, nonlabored ventilation, respiratory function stable and patient connected to nasal cannula oxygen ?Cardiovascular status: stable and blood pressure returned to baseline ?Postop Assessment: no apparent nausea or vomiting ?Anesthetic complications: no ? ? ?No notable events documented. ? ?Last Vitals:  ?Vitals:  ? 08/01/21 0937 08/01/21 0947  ?BP: 100/60 101/64  ?Pulse: 76 67  ?Resp: 20 17  ?Temp:    ?SpO2: 97% 97%  ?  ?Last Pain:  ?Vitals:  ? 08/01/21 0947  ?TempSrc:   ?PainSc: 0-No pain  ? ? ?  ?  ?  ?  ?  ?  ? ?[name] ? ? ? ? ?

## 2021-08-01 NOTE — Discharge Instructions (Signed)
YOU HAD AN ENDOSCOPIC PROCEDURE TODAY: Refer to the procedure report and other information in the discharge instructions given to you for any specific questions about what was found during the examination. If this information does not answer your questions, please call [place] office at [phone] to clarify.  ° °YOU SHOULD EXPECT: Some feelings of bloating in the abdomen. Passage of more gas than usual. Walking can help get rid of the air that was put into your GI tract during the procedure and reduce the bloating. If you had a lower endoscopy (such as a colonoscopy or flexible sigmoidoscopy) you may notice spotting of blood in your stool or on the toilet paper. Some abdominal soreness may be present for a day or two, also. ° °DIET: Your first meal following the procedure should be a light meal and then it is ok to progress to your normal diet. A half-sandwich or bowl of soup is an example of a good first meal. Heavy or fried foods are harder to digest and may make you feel nauseous or bloated. Drink plenty of fluids but you should avoid alcoholic beverages for 24 hours. If you had a esophageal dilation, please see attached instructions for diet.   ° °ACTIVITY: Your care partner should take you home directly after the procedure. You should plan to take it easy, moving slowly for the rest of the day. You can resume normal activity the day after the procedure however YOU SHOULD NOT DRIVE, use power tools, machinery or perform tasks that involve climbing or major physical exertion for 24 hours (because of the sedation medicines used during the test).  ° °SYMPTOMS TO REPORT IMMEDIATELY: °A gastroenterologist can be reached at any hour. Please call [phone]  for any of the following symptoms:  °Following lower endoscopy (colonoscopy, flexible sigmoidoscopy) °Excessive amounts of blood in the stool  °Significant tenderness, worsening of abdominal pains  °Swelling of the abdomen that is new, acute  °Fever of 100° or  higher  °Following upper endoscopy (EGD, EUS, ERCP, esophageal dilation) °Vomiting of blood or coffee ground material  °New, significant abdominal pain  °New, significant chest pain or pain under the shoulder blades  °Painful or persistently difficult swallowing  °New shortness of breath  °Black, tarry-looking or red, bloody stools ° °FOLLOW UP:  °If any biopsies were taken you will be contacted by phone or by letter within the next 1-3 weeks. Call [phone]  if you have not heard about the biopsies in 3 weeks.  °Please also call with any specific questions about appointments or follow up tests. ° °

## 2021-08-02 ENCOUNTER — Encounter (HOSPITAL_COMMUNITY): Payer: Self-pay | Admitting: Gastroenterology

## 2021-08-12 ENCOUNTER — Other Ambulatory Visit: Payer: Self-pay

## 2021-08-12 MED ORDER — LACTULOSE 10 GM/15ML PO SOLN
15.0000 g | Freq: Two times a day (BID) | ORAL | 1 refills | Status: DC
Start: 2021-08-12 — End: 2021-08-16

## 2021-08-16 ENCOUNTER — Other Ambulatory Visit: Payer: Self-pay

## 2021-08-16 MED ORDER — LACTULOSE 10 GM/15ML PO SOLN
15.0000 g | Freq: Two times a day (BID) | ORAL | 2 refills | Status: DC
Start: 2021-08-16 — End: 2021-12-04

## 2021-08-19 ENCOUNTER — Ambulatory Visit (HOSPITAL_COMMUNITY)
Admission: RE | Admit: 2021-08-19 | Discharge: 2021-08-19 | Disposition: A | Discharge status: Home / Self Care | Payer: Medicare Other | Source: Ambulatory Visit | Attending: Gastroenterology | Admitting: Gastroenterology

## 2021-08-19 DIAGNOSIS — K7030 Alcoholic cirrhosis of liver without ascites: Secondary | ICD-10-CM | POA: Diagnosis present

## 2021-08-21 NOTE — Progress Notes (Signed)
? ? ?Patient ID: [name], male    DOB: 06/10/1980  MRN: [id number] ? ?CC: Anxiety Depression Follow-Up ? ?Subjective: ?[name] is a 41 y.o. male who presents for anxiety depression follow-up.  ? ?His concerns today include:  ?Anxiety depression follow-up: ?07/30/2021: ?- Increase Sertraline from 25 mg daily to 50 mg daily. ? ?08/27/2021: ?Reports doing well on current regimen, no issues/concerns.  ? ? ?  08/27/2021  ?  8:09 AM 07/30/2021  ?  2:36 PM 10/16/2020  ?  2:36 PM 06/18/2020  ?  9:54 AM 05/16/2020  ? 10:50 AM  ?Depression screen PHQ 2/9  ?Decreased Interest 1 1 0 1 3  ?Down, Depressed, Hopeless 0 1 1 0 3  ?PHQ - 2 Score 1 2 1 1 6   ?Altered sleeping 0 0 0 2 0  ?Tired, decreased energy 0 0 0 0 0  ?Change in appetite 1 1 0 2 1  ?Feeling bad or failure about yourself  1 0 1 1 3   ?Trouble concentrating 0 1 1 0 3  ?Moving slowly or fidgety/restless 0 0 1 0 1  ?Suicidal thoughts 0 0 0 0 1  ?PHQ-9 Score 3 4 4 6 15   ?Difficult doing work/chores Not difficult at all Not difficult at all Not difficult at all    ? ? ? ?Patient Active Problem List  ? Diagnosis Date Noted  ? Gastroesophageal reflux disease   ? Alcoholic hepatitis [id number]  ? Anemia 05/16/2020  ? Encephalopathy, portal systemic ([place]) 05/16/2020  ? Anxiety 05/16/2020  ? Gynecomastia 05/16/2020  ? Hyperprolactinemia ([place]) 05/16/2020  ? 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/16/2020  ? Esophageal varices with bleeding ([place]) 05/16/2020  ? Encephalopathy 05/16/2020  ? GI bleed 10/09/2019  ? Hematemesis 10/09/2019  ? Leukocytosis 10/09/2019  ? Elevated liver enzymes 10/09/2019  ? Elevated lactic acid level 09/02/2019  ? Bleeding esophageal varices ([place]) 09/02/2019  ? Shock ([place]) 08/30/2019  ? Electrolyte disturbance 07/16/2019  ? Lactic acidosis 07/16/2019  ? Hyperammonemia ([place]) 07/16/2019  ? Hyperbilirubinemia 07/16/2019  ? Alcoholic cirrhosis of liver without ascites ([place]) 07/24/2016  ? Acute encephalopathy   ? Acute upper GI bleed   ? Ascites   ?  Distended abdomen   ? Hemorrhagic shock ([place])   ? Encounter for palliative care   ? Goals of care, counseling/discussion   ? Encephalopathy acute 08/27/2015  ? Alcoholic cirrhosis ([place])   ? Acute hypoxemic respiratory failure ([place])   ? SBP (spontaneous bacterial peritonitis) ([place])   ? Hypocalcemia 08/21/2015  ? Hypomagnesemia 08/21/2015  ? Acute blood loss anemia 08/20/2015  ? Alcohol abuse 08/20/2015  ? Elevated INR 08/20/2015  ? Hypoalbuminemia 08/20/2015  ? Hypokalemia 08/20/2015  ?  ? ?Current Outpatient Medications on File Prior to Visit  ?Medication Sig Dispense Refill  ? Ferrous Sulfate (IRON) 325 (65 Fe) MG TABS Take 1 tablet (325 mg total) by mouth daily. 20 tablet 0  ? folic acid (FOLVITE) A999333 MCG tablet Take 400 mcg by mouth daily.    ? hydrocortisone cream 1 % Apply 1 application topically daily as needed for itching.    ? lactulose (CHRONULAC) 10 GM/15ML solution Take 22.5 mLs (15 g total) by mouth in the morning and at bedtime. 1892 mL 2  ? Magnesium 500 MG TABS Take 500 mg by mouth in the morning and at bedtime.    ? Multiple Vitamin (MULTIVITAMIN WITH MINERALS) TABS tablet Take 1 tablet by mouth daily.    ? nadolol (CORGARD) 20 MG  tablet TAKE 1 TABLET (20 MG TOTAL) BY MOUTH DAILY. 90 tablet 3  ? oxymetazoline (AFRIN) 0.05 % nasal spray Place 1 spray into both nostrils 2 (two) times daily as needed for congestion.    ? pantoprazole (PROTONIX) 40 MG tablet Take 1 tablet (40 mg total) by mouth 2 (two) times daily before a meal. 90 tablet 1  ? thiamine (VITAMIN B-1) 100 MG tablet Take 100 mg by mouth daily.    ? ?No current facility-administered medications on file prior to visit.  ? ? ?Allergies  ?Allergen Reactions  ? Penicillins Hives  ?  Infant  ? ? ?Social History  ? ?Socioeconomic History  ? Marital status: Single  ?  Spouse name: Not on file  ? Number of children: Not on file  ? Years of education: Not on file  ? Highest education level: Not on file  ?Occupational History  ? Not on file  ?Tobacco  Use  ? Smoking status: Former  ?  Packs/day: 1.00  ?  Types: Cigarettes  ? Smokeless tobacco: Never  ?Vaping Use  ? Vaping Use: Never used  ?Substance and Sexual Activity  ? Alcohol use: Not Currently  ?  Alcohol/week: 3.0 standard drinks  ?  Types: 3 Standard drinks or equivalent per week  ?  Comment: Alcoholism  ? Drug use: Yes  ?  Frequency: 7.0 times per week  ?  Types: Marijuana  ? Sexual activity: Not Currently  ?Other Topics Concern  ? Not on file  ?Social History Narrative  ? Not on file  ? ?Social Determinants of Health  ? ?Financial Resource Strain: Not on file  ?Food Insecurity: Not on file  ?Transportation Needs: Not on file  ?Physical Activity: Not on file  ?Stress: Not on file  ?Social Connections: Not on file  ?Intimate Partner Violence: Not on file  ? ? ?Family History  ?Problem Relation Age of Onset  ? Peptic Ulcer Paternal Aunt   ? Crohn's disease Maternal Uncle   ? Heart attack Maternal Uncle   ? Heart attack Maternal Grandfather   ? Stroke Maternal Grandmother   ? Stroke Paternal Grandfather   ? ? ?Past Surgical History:  ?Procedure Laterality Date  ? ESOPHAGEAL BANDING  08/30/2019  ? Procedure: ESOPHAGEAL BANDING;  Surgeon: Ronnette Juniper, MD;  Location: [place];  Service: Gastroenterology;;  ? ESOPHAGEAL BANDING  10/10/2019  ? Procedure: ESOPHAGEAL BANDING;  Surgeon: Ronnette Juniper, MD;  Location: [place];  Service: Gastroenterology;;  ? ESOPHAGEAL BANDING N/A 12/23/2019  ? Procedure: ESOPHAGEAL BANDING;  Surgeon: Otis Brace, MD;  [facility];  Service: Gastroenterology;  Laterality: N/A;  ? ESOPHAGEAL BANDING N/A 01/12/2020  ? Procedure: ESOPHAGEAL BANDING;  Surgeon: Otis Brace, MD;  [facility];  Service: Gastroenterology;  Laterality: N/A;  ? ESOPHAGEAL BANDING N/A 09/10/2020  ? Procedure: ESOPHAGEAL BANDING;  Surgeon: Yetta Flock, MD;  Location: [facility];  Service: Gastroenterology;  Laterality: N/A;  ? ESOPHAGEAL BANDING  04/16/2021  ?  Procedure: ESOPHAGEAL BANDING;  Surgeon: Sharyn Creamer, MD;  Location: [facility];  Service: Gastroenterology;;  ? ESOPHAGEAL BANDING N/A 05/21/2021  ? Procedure: ESOPHAGEAL BANDING;  Surgeon: Milus Banister, MD;  Location: [facility];  Service: Endoscopy;  Laterality: N/A;  ? ESOPHAGOGASTRODUODENOSCOPY Left 08/21/2015  ? Procedure: ESOPHAGOGASTRODUODENOSCOPY (EGD);  Surgeon: Wilford Corner, MD;  Location: [facility];  Service: Endoscopy;  Laterality: Left;  ? ESOPHAGOGASTRODUODENOSCOPY N/A 12/05/2019  ? Procedure: ESOPHAGOGASTRODUODENOSCOPY (EGD);  Surgeon: Otis Brace, MD;  Location: [place];  Service: Gastroenterology;  Laterality: N/A;  ? ESOPHAGOGASTRODUODENOSCOPY (EGD) WITH PROPOFOL N/A 08/30/2019  ? Procedure: ESOPHAGOGASTRODUODENOSCOPY (EGD) WITH PROPOFOL;  Surgeon: Ronnette Juniper, MD;  Location: [place];  Service: Gastroenterology;  Laterality: N/A;  ? ESOPHAGOGASTRODUODENOSCOPY (EGD) WITH PROPOFOL N/A 10/10/2019  ? Procedure: ESOPHAGOGASTRODUODENOSCOPY (EGD) WITH PROPOFOL;  Surgeon: Ronnette Juniper, MD;  Location: [place];  Service: Gastroenterology;  Laterality: N/A;  ? ESOPHAGOGASTRODUODENOSCOPY (EGD) WITH PROPOFOL N/A 12/23/2019  ? Procedure: ESOPHAGOGASTRODUODENOSCOPY (EGD) WITH PROPOFOL;  Surgeon: Otis Brace, MD;  [facility];  Service: Gastroenterology;  Laterality: N/A;  ? ESOPHAGOGASTRODUODENOSCOPY (EGD) WITH PROPOFOL N/A 01/12/2020  ? Procedure: ESOPHAGOGASTRODUODENOSCOPY (EGD) WITH PROPOFOL;  Surgeon: Otis Brace, MD;  [facility];  Service: Gastroenterology;  Laterality: N/A;  ? ESOPHAGOGASTRODUODENOSCOPY (EGD) WITH PROPOFOL N/A 09/10/2020  ? Procedure: ESOPHAGOGASTRODUODENOSCOPY (EGD) WITH PROPOFOL;  Surgeon: Yetta Flock, MD;  [facility];  Service: Gastroenterology;  Laterality: N/A;  ? ESOPHAGOGASTRODUODENOSCOPY (EGD) WITH PROPOFOL N/A 04/16/2021  ? Procedure: ESOPHAGOGASTRODUODENOSCOPY (EGD) WITH PROPOFOL;  Surgeon:  Sharyn Creamer, MD;  [facility];  Service: Gastroenterology;  Laterality: N/A;  ? ESOPHAGOGASTRODUODENOSCOPY (EGD) WITH PROPOFOL N/A 05/21/2021  ? Procedure: ESOPHAGOGASTRODUODENOSCOPY (EGD) WITH PROPOFO

## 2021-08-27 ENCOUNTER — Ambulatory Visit (INDEPENDENT_AMBULATORY_CARE_PROVIDER_SITE_OTHER): Payer: Medicare Other | Admitting: Family

## 2021-08-27 VITALS — BP 116/79 | HR 67 | Temp 98.3°F | Resp 18 | Ht 67.01 in | Wt 145.0 lb

## 2021-08-27 DIAGNOSIS — F32A Depression, unspecified: Secondary | ICD-10-CM

## 2021-08-27 DIAGNOSIS — F419 Anxiety disorder, unspecified: Secondary | ICD-10-CM

## 2021-08-27 MED ORDER — SERTRALINE HCL 50 MG PO TABS: 50.0000 mg | ORAL_TABLET | Freq: Every day | ORAL | 1 refills | Status: DC

## 2021-08-27 NOTE — Progress Notes (Signed)
Pt presents for anxiety follow-up, states he is doing well  ?

## 2021-09-17 ENCOUNTER — Other Ambulatory Visit: Payer: Self-pay

## 2021-09-17 DIAGNOSIS — I8501 Esophageal varices with bleeding: Secondary | ICD-10-CM

## 2021-09-17 MED ORDER — PANTOPRAZOLE SODIUM 40 MG PO TBEC: 40.0000 mg | DELAYED_RELEASE_TABLET | Freq: Two times a day (BID) | ORAL | 5 refills | Status: DC

## 2021-09-17 NOTE — Progress Notes (Signed)
Faxed request for refill of pantoprazole BID

## 2021-12-04 ENCOUNTER — Other Ambulatory Visit: Payer: Self-pay

## 2021-12-04 DIAGNOSIS — K7030 Alcoholic cirrhosis of liver without ascites: Secondary | ICD-10-CM

## 2021-12-04 DIAGNOSIS — K7682 Hepatic encephalopathy: Secondary | ICD-10-CM

## 2021-12-04 DIAGNOSIS — I8501 Esophageal varices with bleeding: Secondary | ICD-10-CM

## 2021-12-04 MED ORDER — LACTULOSE 10 GM/15ML PO SOLN: 15.0000 g | Freq: Two times a day (BID) | ORAL | 2 refills | Status: DC

## 2021-12-04 NOTE — Progress Notes (Unsigned)
Refill of lactulose sent to pharmacy per patient request.  Patient scheduled for EGD for surveillance of esophageal varices on Monday 10-16 at 7:30 am al [facility]. Instructions sent to patient's MyChart

## 2021-12-15 NOTE — Progress Notes (Signed)
Patient ID: [name], male    DOB: 1981-01-08  MRN: [id number]  CC: Anxiety Depression   Subjective: [name] is a 41 y.o. male who presents for anxiety depression.   His concerns today include:  Since last visit anxiety depression increased. In the past reported primarily related to his medical diagnoses. More recently stressors include unpredictability of family members visiting home (reports he lives with his parents) and his dogs. States he does not have any plans to kill himself, harm himself, or others. States "Sometimes I just wonder why am I here." He would like referral to Psychiatry. Reports in the past Xanax helped with symptoms and would like to be prescribed the same. He is not drinking alcohol.       12/24/2021    8:22 AM 08/27/2021    8:09 AM 07/30/2021    2:36 PM 10/16/2020    2:36 PM 06/18/2020    9:54 AM  Depression screen PHQ 2/9  Decreased Interest 3 1 1  0 1  Down, Depressed, Hopeless 3 0 1 1 0  PHQ - 2 Score 6 1 2 1 1   Altered sleeping 3 0 0 0 2  Tired, decreased energy 2 0 0 0 0  Change in appetite 2 1 1  0 2  Feeling bad or failure about yourself  3 1 0 1 1  Trouble concentrating 1 0 1 1 0  Moving slowly or fidgety/restless 2 0 0 1 0  Suicidal thoughts 3 0 0 0 0  PHQ-9 Score 22 3 4 4 6   Difficult doing work/chores Extremely dIfficult Not difficult at all Not difficult at all Not difficult at all      Patient Active Problem List   Diagnosis Date Noted   Gastroesophageal reflux disease    Alcoholic hepatitis 05/16/2020   Anemia 05/16/2020   Encephalopathy, portal systemic ([facility]) 05/16/2020   Anxiety 05/16/2020   Gynecomastia 05/16/2020   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/16/2020   Esophageal varices with bleeding ([facility]) 05/16/2020   Encephalopathy 05/16/2020   GI bleed 10/09/2019   Hematemesis 10/09/2019   Leukocytosis 10/09/2019   Elevated liver enzymes 10/09/2019   Elevated lactic acid  level 09/02/2019   Bleeding esophageal varices ([facility]) 09/02/2019   Shock (HCC) 08/30/2019   Electrolyte disturbance 07/16/2019   Lactic acidosis 07/16/2019   Hyperammonemia ([facility]CC) 07/16/2019   Hyperbilirubinemia 07/16/2019   Alcoholic cirrhosis of liver without ascites ([facility]CC) 07/24/2016   Acute encephalopathy    Acute upper GI bleed    Ascites    Distended abdomen    Hemorrhagic shock (HCC)    Encounter for palliative care    Goals of care, counseling/discussion    Encephalopathy acute 08/27/2015   Alcoholic cirrhosis (HCC)    Acute hypoxemic respiratory failure (HCC)    SBP (spontaneous bacterial peritonitis) (HCC)    Hypocalcemia 08/21/2015   Hypomagnesemia 08/21/2015   Acute blood loss anemia 08/20/2015   Alcohol abuse 08/20/2015   Elevated INR 08/20/2015   Hypoalbuminemia 08/20/2015   Hypokalemia 08/20/2015     Current Outpatient Medications on File Prior to Visit  Medication Sig Dispense Refill   Ferrous Sulfate (IRON) 325 (65 Fe) MG TABS Take 1 tablet (325 mg total) by mouth daily. 20 tablet 0   folic acid (FOLVITE) 400 MCG tablet Take 400 mcg by mouth daily.     hydrocortisone cream 1 % Apply 1 application topically daily as needed for itching.     lactulose (CHRONULAC)  10 GM/15ML solution Take 22.5 mLs (15 g total) by mouth in the morning and at bedtime. 1892 mL 2   Magnesium 500 MG TABS Take 500 mg by mouth in the morning and at bedtime.     Multiple Vitamin (MULTIVITAMIN WITH MINERALS) TABS tablet Take 1 tablet by mouth daily.     nadolol (CORGARD) 20 MG tablet TAKE 1 TABLET (20 MG TOTAL) BY MOUTH DAILY. 90 tablet 3   oxymetazoline (AFRIN) 0.05 % nasal spray Place 1 spray into both nostrils 2 (two) times daily as needed for congestion.     pantoprazole (PROTONIX) 40 MG tablet Take 1 tablet (40 mg total) by mouth 2 (two) times daily before a meal. 60 tablet 5   thiamine (VITAMIN B-1) 100 MG tablet Take 100 mg by mouth daily.     No current facility-administered  medications on file prior to visit.    Allergies  Allergen Reactions   Penicillins Hives    Infant    Social History   Socioeconomic History   Marital status: Single    Spouse name: Not on file   Number of children: Not on file   Years of education: Not on file   Highest education level: Not on file  Occupational History   Not on file  Tobacco Use   Smoking status: Former    Packs/day: 1.00    Types: Cigarettes   Smokeless tobacco: Never  Vaping Use   Vaping Use: Never used  Substance and Sexual Activity   Alcohol use: Not Currently    Alcohol/week: 3.0 standard drinks of alcohol    Types: 3 Standard drinks or equivalent per week    Comment: Alcoholism   Drug use: Yes    Frequency: 7.0 times per week    Types: Marijuana   Sexual activity: Not Currently  Other Topics Concern   Not on file  Social History Narrative   Not on file   Social Determinants of Health   Financial Resource Strain: Not on file  Food Insecurity: Not on file  Transportation Needs: Not on file  Physical Activity: Not on file  Stress: Not on file  Social Connections: Not on file  Intimate Partner Violence: Not on file    Family History  Problem Relation Age of Onset   Peptic Ulcer Paternal Aunt    Crohn's disease Maternal Uncle    Heart attack Maternal Uncle    Heart attack Maternal Grandfather    Stroke Maternal Grandmother    Stroke Paternal Grandfather     Past Surgical History:  Procedure Laterality Date   ESOPHAGEAL BANDING  08/30/2019   Procedure: ESOPHAGEAL BANDING;  Surgeon: Kerin Salen, MD;  Location: [facility];  Service: Gastroenterology;;   ESOPHAGEAL BANDING  10/10/2019   Procedure: ESOPHAGEAL BANDING;  Surgeon: Kerin Salen, MD;  Location: [facility];  Service: Gastroenterology;;   ESOPHAGEAL BANDING N/A 12/23/2019   Procedure: ESOPHAGEAL BANDING;  Surgeon: Kathi Der, MD;  Location: [facility];  Service: Gastroenterology;  Laterality: N/A;   ESOPHAGEAL  BANDING N/A 01/12/2020   Procedure: ESOPHAGEAL BANDING;  Surgeon: Kathi Der, MD;  Location: [facility];  Service: Gastroenterology;  Laterality: N/A;   ESOPHAGEAL BANDING N/A 09/10/2020   Procedure: ESOPHAGEAL BANDING;  Surgeon: Benancio Deeds, MD;  Location[facility];  Service: Gastroenterology;  Laterality: N/A;   ESOPHAGEAL BANDING  04/16/2021   Procedure: ESOPHAGEAL BANDING;  Surgeon: Imogene Burn, MD;  Location: [facility];  Service: Gastroenterology;;   ESOPHAGEAL BANDING N/A 05/21/2021  Procedure: ESOPHAGEAL BANDING;  Surgeon: Milus Banister, MD;  Location: [facility];  Service: Endoscopy;  Laterality: N/A;   ESOPHAGOGASTRODUODENOSCOPY Left 08/21/2015   Procedure: ESOPHAGOGASTRODUODENOSCOPY (EGD);  Surgeon: Wilford Corner, MD;  Location: [facility];  Service: Endoscopy;  Laterality: Left;   ESOPHAGOGASTRODUODENOSCOPY N/A 12/05/2019   Procedure: ESOPHAGOGASTRODUODENOSCOPY (EGD);  Surgeon: Otis Brace, MD;  Location: [facility];  Service: Gastroenterology;  Laterality: N/A;   ESOPHAGOGASTRODUODENOSCOPY (EGD) WITH PROPOFOL N/A 08/30/2019   Procedure: ESOPHAGOGASTRODUODENOSCOPY (EGD) WITH PROPOFOL;  Surgeon: Ronnette Juniper, MD;  Location: [place];  Service: Gastroenterology;  Laterality: N/A;   ESOPHAGOGASTRODUODENOSCOPY (EGD) WITH PROPOFOL N/A 10/10/2019   Procedure: ESOPHAGOGASTRODUODENOSCOPY (EGD) WITH PROPOFOL;  Surgeon: Ronnette Juniper, MD;  Location: [place];  Service: Gastroenterology;  Laterality: N/A;   ESOPHAGOGASTRODUODENOSCOPY (EGD) WITH PROPOFOL N/A 12/23/2019   Procedure: ESOPHAGOGASTRODUODENOSCOPY (EGD) WITH PROPOFOL;  Surgeon: Otis Brace, MD;  [facility];  Service: Gastroenterology;  Laterality: N/A;   ESOPHAGOGASTRODUODENOSCOPY (EGD) WITH PROPOFOL N/A 01/12/2020   Procedure: ESOPHAGOGASTRODUODENOSCOPY (EGD) WITH PROPOFOL;  Surgeon: Otis Brace, MD;  Location: [facility];  Service: Gastroenterology;  Laterality: N/A;    ESOPHAGOGASTRODUODENOSCOPY (EGD) WITH PROPOFOL N/A 09/10/2020   Procedure: ESOPHAGOGASTRODUODENOSCOPY (EGD) WITH PROPOFOL;  Surgeon: Yetta Flock, MD;  Location: [facility];  Service: Gastroenterology;  Laterality: N/A;   ESOPHAGOGASTRODUODENOSCOPY (EGD) WITH PROPOFOL N/A 04/16/2021   Procedure: ESOPHAGOGASTRODUODENOSCOPY (EGD) WITH PROPOFOL;  Surgeon: Sharyn Creamer, MD;  Location: [facility];  Service: Gastroenterology;  Laterality: N/A;   ESOPHAGOGASTRODUODENOSCOPY (EGD) WITH PROPOFOL N/A 05/21/2021   Procedure: ESOPHAGOGASTRODUODENOSCOPY (EGD) WITH PROPOFOL;  Surgeon: Milus Banister, MD;  Location: [facility];  Service: Endoscopy;  Laterality: N/A;   ESOPHAGOGASTRODUODENOSCOPY (EGD) WITH PROPOFOL N/A 08/01/2021   Procedure: ESOPHAGOGASTRODUODENOSCOPY (EGD) WITH PROPOFOL;  Surgeon: Yetta Flock, MD;  Location: [facility];  Service: Gastroenterology;  Laterality: N/A;   GASTRIC VARICES BANDING  12/05/2019   Procedure: GASTRIC VARICES BANDING;  Surgeon: Otis Brace, MD;  Location: [place];  Service: Gastroenterology;;  placed 6 bands   HOT HEMOSTASIS N/A 12/05/2019   Procedure: HOT HEMOSTASIS (ARGON PLASMA COAGULATION/BICAP);  Surgeon: Otis Brace, MD;  Location: [facility];  Service: Gastroenterology;  Laterality: N/A;   IR RADIOLOGIST EVAL & MGMT  12/13/2019    ROS: Review of Systems Negative except as stated above  PHYSICAL EXAM: BP 124/82 (BP Location: Left Arm, Patient Position: Sitting, Cuff Size: Normal)   Pulse 60   Temp 98.3 F (36.8 C)   Resp 16   Ht 5' 7.01" (1.702 m)   Wt 145 lb (65.8 kg)   SpO2 98%   BMI 22.70 kg/m   Physical Exam HENT:     Head: Normocephalic and atraumatic.  Eyes:     Extraocular Movements: Extraocular movements intact.     Conjunctiva/sclera: Conjunctivae normal.     Pupils: Pupils are equal, round, and reactive to light.  Cardiovascular:     Rate and Rhythm: Normal rate and regular rhythm.     Pulses:  Normal pulses.     Heart sounds: Normal heart sounds.  Pulmonary:     Effort: Pulmonary effort is normal.     Breath sounds: Normal breath sounds.  Musculoskeletal:     Cervical back: Normal range of motion and neck supple.  Neurological:     General: No focal deficit present.     Mental Status: He is alert and oriented to person, place, and time.  Psychiatric:        Mood and Affect: Mood is anxious. Affect is  tearful.      ASSESSMENT AND PLAN: 1. Anxiety and depression - Patient denies thoughts of self-harm, suicidal ideations, homicidal ideations. - Increase Sertraline from 50 mg daily to 100 mg daily.  - Begin Hydroxyzine as prescribed. Counseled on medication adherence and adverse effects. - Referral to Psychiatry for further evaluation and management.  - Patient declined referral to [facility] for counseling services to bridge appointment to Psychiatry referral.  - Patient provided with contact information to [facility] should he need it prior to appointment with Psychiatry.  - If patient has not established with Psychiatry within the next 4 weeks he should see me back in office at that time for follow-up or sooner if needed. Patient verbalized understanding. - sertraline (ZOLOFT) 100 MG tablet; Take 1 tablet (100 mg total) by mouth daily.  Dispense: 30 tablet; Refill: 3 - Ambulatory referral to Psychiatry - hydrOXYzine (VISTARIL) 25 MG capsule; Take 1 capsule (25 mg total) by mouth every 8 (eight) hours as needed.  Dispense: 30 capsule; Refill: 2   Patient was given the opportunity to ask questions.  Patient verbalized understanding of the plan and was able to repeat key elements of the plan. Patient was given clear instructions to go to Emergency Department or return to medical center if symptoms don't improve, worsen, or new problems develop.The patient verbalized understanding.   Orders Placed This Encounter  Procedures   Ambulatory referral  to Psychiatry     Requested Prescriptions   Signed Prescriptions Disp Refills   sertraline (ZOLOFT) 100 MG tablet 30 tablet 3    Sig: Take 1 tablet (100 mg total) by mouth daily.   hydrOXYzine (VISTARIL) 25 MG capsule 30 capsule 2    Sig: Take 1 capsule (25 mg total) by mouth every 8 (eight) hours as needed.    Follow-up with primary provider as scheduled.  Camillia Herter, NP

## 2021-12-24 ENCOUNTER — Ambulatory Visit (INDEPENDENT_AMBULATORY_CARE_PROVIDER_SITE_OTHER): Payer: Medicare Other | Admitting: Family

## 2021-12-24 VITALS — BP 124/82 | HR 60 | Temp 98.3°F | Resp 16 | Ht 67.01 in | Wt 145.0 lb

## 2021-12-24 DIAGNOSIS — Z131 Encounter for screening for diabetes mellitus: Secondary | ICD-10-CM

## 2021-12-24 DIAGNOSIS — F32A Depression, unspecified: Secondary | ICD-10-CM | POA: Diagnosis not present

## 2021-12-24 DIAGNOSIS — F419 Anxiety disorder, unspecified: Secondary | ICD-10-CM | POA: Diagnosis not present

## 2021-12-24 MED ORDER — HYDROXYZINE PAMOATE 25 MG PO CAPS: 25.0000 mg | ORAL_CAPSULE | Freq: Three times a day (TID) | ORAL | 2 refills | Status: DC | PRN

## 2021-12-24 MED ORDER — SERTRALINE HCL 100 MG PO TABS: 100.0000 mg | ORAL_TABLET | Freq: Every day | ORAL | 3 refills | Status: DC

## 2021-12-24 NOTE — Progress Notes (Signed)
Pt presents for anxiety and depression -request referral to Psychiatry depression/anxiety has worsened

## 2021-12-24 NOTE — Patient Instructions (Signed)
Major Depressive Disorder, Adult Major depressive disorder is a mental health condition. This disorder affects feelings. It can also affect the body. Symptoms of this condition last most of the day, almost every day, for 2 weeks. This disorder can affect: Relationships. Daily activities, such as work and school. Activities that you normally like to do. What are the causes? The cause of this condition is not known. The disorder is likely caused by a mix of things, including: Your personality, such as being a shy person. Your behavior, or how you act toward others. Your thoughts and feelings. Too much alcohol or drugs. How you react to stress. Health and mental problems that you have had for a long time. Things that hurt you in the past (trauma). Big changes in your life, such as divorce. What increases the risk? The following factors may make you more likely to develop this condition: Having family members with depression. Being a woman. Problems in the family. Low levels of some brain chemicals. Things that caused you pain as a child, especially if you lost a parent or were abused. A lot of stress in your life, such as from: Living without basic needs of life, such as food and shelter. Being treated poorly because of race, sex, or religion (discrimination). Health and mental problems that you have had for a long time. What are the signs or symptoms? The main symptoms of this condition are: Being sad all the time. Being grouchy all the time. Loss of interest in things and activities. Other symptoms include: Sleeping too much or too little. Eating too much or too little. Gaining or losing weight, without knowing why. Feeling tired or having low energy. Being restless and weak. Feeling hopeless, worthless, or guilty. Trouble thinking clearly or making decisions. Thoughts of hurting yourself or others, or thoughts of ending your life. Spending a lot of time alone. Inability to  complete common tasks of daily life. If you have very bad MDD, you may: Believe things that are not true. Hear, see, taste, or feel things that are not there. Have mild depression that lasts for at least 2 years. Feel very sad and hopeless. Have trouble speaking or moving. How is this treated? This condition may be treated with: Talk therapy. This teaches you to know bad thoughts, feelings, and actions and how to change them. This can also help you to communicate with others. This can be done with members of your family. Medicines. These can be used to treat worry (anxiety), depression, or low levels of chemicals in the brain. Lifestyle changes. You may need to: Limit alcohol use. Limit drug use. Get regular exercise. Get plenty of sleep. Make healthy eating choices. Spend more time outdoors. Brain stimulation. This treatment excites the brain. This is done when symptoms are very bad or have not gotten better with other treatments. Follow these instructions at home: Activity Get regular exercise as told. Spend time outdoors as told. Make time to do the things you enjoy. Find ways to deal with stress. Try to: Meditate. Do deep breathing. Spend time in nature. Keep a journal. Return to your normal activities as told by your doctor. Ask your doctor what activities are safe for you. Alcohol and drug use If you drink alcohol: Limit how much you use to: 0-1 drink a day for women. 0-2 drinks a day for men. Be aware of how much alcohol is in your drink. In the [place] one drink equals one 12 oz bottle of beer (355 mL),   one 5 oz glass of wine (148 mL), or one 1 oz glass of hard liquor (44 mL). Talk to your doctor about: Alcohol use. Alcohol can affect some medicines. Any drug use. General instructions  Take over-the-counter and prescription medicines and herbal preparations only as told by your doctor. Eat a healthy diet. Get a lot of sleep. Think about joining a support group.  Your doctor may be able to suggest one. Keep all follow-up visits as told by your doctor. This is important. Where to find more information: National Alliance on Mental Illness: www.nami.org [place] National Institute of Mental Health: www.nimh.nih.gov American Psychiatric Association: www.psychiatry.org/patients-families/ Contact a doctor if: Your symptoms get worse. You get new symptoms. Get help right away if: You hurt yourself. You have serious thoughts about hurting yourself or others. You see, hear, taste, smell, or feel things that are not there. If you ever feel like you may hurt yourself or others, or have thoughts about taking your own life, get help right away. Go to your nearest emergency department or: Call your local emergency services (911 in the [place] Call a suicide crisis helpline, such as the National Suicide Prevention Lifeline at 1-800-273-8255 or 988 in the [place] This is open 24 hours a day in the [place] Text the Crisis Text Line at 741741 (in the [place] Summary Major depressive disorder is a mental health condition. This disorder affects feelings. Symptoms of this condition last most of the day, almost every day, for 2 weeks. The symptoms of this disorder can cause problems with relationships and with daily activities. There are treatments and support for people who get this disorder. You may need more than one type of treatment. Get help right away if you have serious thoughts about hurting yourself or others. This information is not intended to replace advice given to you by your health care provider. Make sure you discuss any questions you have with your health care provider. Document Revised: 11/07/2020 Document Reviewed: 03/26/2019 Elsevier Patient Education  2023 Elsevier Inc.  

## 2021-12-30 ENCOUNTER — Encounter (HOSPITAL_COMMUNITY): Payer: Self-pay

## 2021-12-30 ENCOUNTER — Inpatient Hospital Stay (HOSPITAL_COMMUNITY): Payer: Medicare Other

## 2021-12-30 ENCOUNTER — Other Ambulatory Visit: Payer: Self-pay

## 2021-12-30 ENCOUNTER — Inpatient Hospital Stay (HOSPITAL_BASED_OUTPATIENT_CLINIC_OR_DEPARTMENT_OTHER)
Admission: EM | Admit: 2021-12-30 | Discharge: 2022-01-01 | DRG: 378 | Disposition: A | Discharge status: Home / Self Care | Payer: Medicare Other | Attending: Internal Medicine | Admitting: Internal Medicine

## 2021-12-30 ENCOUNTER — Encounter (HOSPITAL_BASED_OUTPATIENT_CLINIC_OR_DEPARTMENT_OTHER): Payer: Self-pay | Admitting: Emergency Medicine

## 2021-12-30 DIAGNOSIS — Z7141 Alcohol abuse counseling and surveillance of alcoholic: Secondary | ICD-10-CM

## 2021-12-30 DIAGNOSIS — Z87891 Personal history of nicotine dependence: Secondary | ICD-10-CM | POA: Diagnosis not present

## 2021-12-30 DIAGNOSIS — R7989 Other specified abnormal findings of blood chemistry: Secondary | ICD-10-CM

## 2021-12-30 DIAGNOSIS — K920 Hematemesis: Secondary | ICD-10-CM | POA: Diagnosis present

## 2021-12-30 DIAGNOSIS — Z8249 Family history of ischemic heart disease and other diseases of the circulatory system: Secondary | ICD-10-CM | POA: Diagnosis not present

## 2021-12-30 DIAGNOSIS — K7030 Alcoholic cirrhosis of liver without ascites: Secondary | ICD-10-CM | POA: Diagnosis present

## 2021-12-30 DIAGNOSIS — I8510 Secondary esophageal varices without bleeding: Secondary | ICD-10-CM | POA: Diagnosis present

## 2021-12-30 DIAGNOSIS — E871 Hypo-osmolality and hyponatremia: Secondary | ICD-10-CM | POA: Diagnosis present

## 2021-12-30 DIAGNOSIS — Z79899 Other long term (current) drug therapy: Secondary | ICD-10-CM

## 2021-12-30 DIAGNOSIS — K3189 Other diseases of stomach and duodenum: Secondary | ICD-10-CM | POA: Diagnosis present

## 2021-12-30 DIAGNOSIS — K766 Portal hypertension: Secondary | ICD-10-CM | POA: Diagnosis present

## 2021-12-30 DIAGNOSIS — I8511 Secondary esophageal varices with bleeding: Secondary | ICD-10-CM | POA: Diagnosis not present

## 2021-12-30 DIAGNOSIS — F418 Other specified anxiety disorders: Secondary | ICD-10-CM | POA: Diagnosis present

## 2021-12-30 DIAGNOSIS — Z880 Allergy status to penicillin: Secondary | ICD-10-CM | POA: Diagnosis not present

## 2021-12-30 DIAGNOSIS — K922 Gastrointestinal hemorrhage, unspecified: Secondary | ICD-10-CM

## 2021-12-30 DIAGNOSIS — K219 Gastro-esophageal reflux disease without esophagitis: Secondary | ICD-10-CM | POA: Diagnosis present

## 2021-12-30 DIAGNOSIS — R791 Abnormal coagulation profile: Secondary | ICD-10-CM | POA: Diagnosis present

## 2021-12-30 DIAGNOSIS — I8501 Esophageal varices with bleeding: Secondary | ICD-10-CM

## 2021-12-30 DIAGNOSIS — K7682 Hepatic encephalopathy: Secondary | ICD-10-CM

## 2021-12-30 LAB — URINALYSIS, ROUTINE W REFLEX MICROSCOPIC
Bilirubin Urine: NEGATIVE
Glucose, UA: NEGATIVE mg/dL
Hgb urine dipstick: NEGATIVE
Ketones, ur: >80 mg/dL — AB
Leukocytes,Ua: NEGATIVE
Nitrite: NEGATIVE
Protein, ur: 30 mg/dL — AB
Specific Gravity, Urine: 1.034 — ABNORMAL HIGH (ref 1.005–1.030)
pH: 5.5 (ref 5.0–8.0)

## 2021-12-30 LAB — MAGNESIUM: Magnesium: 2 mg/dL (ref 1.7–2.4)

## 2021-12-30 LAB — HEMOGLOBIN AND HEMATOCRIT, BLOOD
HCT: 40.3 % (ref 39.0–52.0)
Hemoglobin: 14 g/dL (ref 13.0–17.0)

## 2021-12-30 LAB — HIV ANTIBODY (ROUTINE TESTING W REFLEX): HIV Screen 4th Generation wRfx: NONREACTIVE

## 2021-12-30 LAB — CBC
HCT: 42.6 % (ref 39.0–52.0)
Hemoglobin: 15.2 g/dL (ref 13.0–17.0)
MCH: 32.9 pg (ref 26.0–34.0)
MCHC: 35.7 g/dL (ref 30.0–36.0)
MCV: 92.2 fL (ref 80.0–100.0)
Platelets: 185 10*3/uL (ref 150–400)
RBC: 4.62 MIL/uL (ref 4.22–5.81)
RDW: 12.6 % (ref 11.5–15.5)
WBC: 10.1 10*3/uL (ref 4.0–10.5)
nRBC: 0 % (ref 0.0–0.2)

## 2021-12-30 LAB — COMPREHENSIVE METABOLIC PANEL
ALT: 54 U/L — ABNORMAL HIGH (ref 0–44)
AST: 91 U/L — ABNORMAL HIGH (ref 15–41)
Albumin: 4.6 g/dL (ref 3.5–5.0)
Alkaline Phosphatase: 50 U/L (ref 38–126)
Anion gap: 22 — ABNORMAL HIGH (ref 5–15)
BUN: 14 mg/dL (ref 6–20)
CO2: 22 mmol/L (ref 22–32)
Calcium: 9.7 mg/dL (ref 8.9–10.3)
Chloride: 88 mmol/L — ABNORMAL LOW (ref 98–111)
Creatinine, Ser: 0.65 mg/dL (ref 0.61–1.24)
GFR, Estimated: >60 mL/min (ref >60)
Glucose, Bld: 102 mg/dL — ABNORMAL HIGH (ref 70–99)
Potassium: 3.7 mmol/L (ref 3.5–5.1)
Sodium: 132 mmol/L — ABNORMAL LOW (ref 135–145)
Total Bilirubin: 2.5 mg/dL — ABNORMAL HIGH (ref 0.3–1.2)
Total Protein: 8.3 g/dL — ABNORMAL HIGH (ref 6.5–8.1)

## 2021-12-30 LAB — OCCULT BLOOD X 1 CARD TO LAB, STOOL: Fecal Occult Bld: POSITIVE — AB

## 2021-12-30 LAB — AMMONIA: Ammonia: 52 umol/L — ABNORMAL HIGH (ref 9–35)

## 2021-12-30 LAB — LIPASE, BLOOD: Lipase: 29 U/L (ref 11–51)

## 2021-12-30 LAB — PROTIME-INR
INR: 1.3 — ABNORMAL HIGH (ref 0.8–1.2)
Prothrombin Time: 16.3 seconds — ABNORMAL HIGH (ref 11.4–15.2)

## 2021-12-30 MED ORDER — ONDANSETRON 4 MG PO TBDP: 4.0000 mg | ORAL_TABLET | Freq: Once | ORAL | Status: DC | PRN

## 2021-12-30 MED ORDER — SODIUM CHLORIDE 0.9 % IV SOLN
2.0000 g | INTRAVENOUS | Status: DC
  Administered 2021-12-31 – 2022-01-01 (×2): 2 g via INTRAVENOUS
  Filled 2021-12-30 (×2): qty 20

## 2021-12-30 MED ORDER — PANTOPRAZOLE INFUSION (NEW) - SIMPLE MED
8.0000 mg/h | INTRAVENOUS | Status: DC
  Administered 2021-12-30 – 2022-01-01 (×5): 8 mg/h via INTRAVENOUS
  Filled 2021-12-30: qty 80
  Filled 2021-12-30 (×3): qty 100
  Filled 2021-12-30: qty 80
  Filled 2021-12-30: qty 100
  Filled 2021-12-30 (×2): qty 80

## 2021-12-30 MED ORDER — ONDANSETRON HCL 4 MG/2ML IJ SOLN
4.0000 mg | Freq: Once | INTRAMUSCULAR | Status: AC
Start: 2021-12-30 — End: 2021-12-30
  Administered 2021-12-30: 4 mg via INTRAVENOUS
  Filled 2021-12-30: qty 2

## 2021-12-30 MED ORDER — SODIUM CHLORIDE 0.9 % IV SOLN
50.0000 ug/h | INTRAVENOUS | Status: DC
  Administered 2021-12-30 – 2022-01-01 (×5): 50 ug/h via INTRAVENOUS
  Filled 2021-12-30 (×8): qty 1

## 2021-12-30 MED ORDER — SODIUM CHLORIDE 0.9 % IV SOLN: INTRAVENOUS | Status: DC

## 2021-12-30 MED ORDER — PROCHLORPERAZINE EDISYLATE 10 MG/2ML IJ SOLN: 10.0000 mg | Freq: Four times a day (QID) | INTRAMUSCULAR | Status: DC | PRN

## 2021-12-30 MED ORDER — PANTOPRAZOLE 80MG IVPB - SIMPLE MED
80.0000 mg | Freq: Once | INTRAVENOUS | Status: AC
  Administered 2021-12-30: 80 mg via INTRAVENOUS
  Filled 2021-12-30: qty 100

## 2021-12-30 MED ORDER — SODIUM CHLORIDE 0.9 % IV SOLN
2.0000 g | Freq: Once | INTRAVENOUS | Status: AC
  Administered 2021-12-30: 2 g via INTRAVENOUS
  Filled 2021-12-30: qty 20

## 2021-12-30 MED ORDER — ORAL CARE MOUTH RINSE: 15.0000 mL | OROMUCOSAL | Status: DC | PRN

## 2021-12-30 MED ORDER — OCTREOTIDE LOAD VIA INFUSION
100.0000 ug | Freq: Once | INTRAVENOUS | Status: AC
  Administered 2021-12-30: 100 ug via INTRAVENOUS
  Filled 2021-12-30: qty 50

## 2021-12-30 NOTE — ED Notes (Signed)
Patient reports no emesis since Zofran was given. Patient resting comfortably. Will continue to monitor.

## 2021-12-30 NOTE — H&P (Signed)
History and Physical    Patient: [name] GBT:[id number] DOB: 10/09/1980 DOA: 12/30/2021 DOS: the patient was seen and examined on 12/30/2021 PCP: Rema Fendt, NP  Patient coming from: Home  Chief Complaint:  Chief Complaint  Patient presents with   Hematemesis   HPI: [name] is a 41 y.o. male with medical history significant of alcoholic cirrhosis, anxiety/depression, GERD. Presenting with hematemesis. He reports early this morning around 0200hrs, he had sudden onset hematemesis. Also during this time, he noticed black stools. He didn't try any meds to help the N/V. He has dizziness with his vomiting, but no other symptoms. He denies current alcohol use. When her vomiting persisted, he decided to come to the ED for evaluation. He denies any other aggravating or alleviating factors.    Review of Systems: As mentioned in the history of present illness. All other systems reviewed and are negative. Past Medical History:  Diagnosis Date   Anxiety    Phreesia 05/14/2020   Blood transfusion without reported diagnosis    Phreesia 05/14/2020   Cirrhosis of liver (HCC)    Depression    Phreesia 05/14/2020   Esophageal varices with hemorrhage (HCC)    ETOH abuse    GERD (gastroesophageal reflux disease)    Seizures (HCC)    Phreesia 05/14/2020   Substance abuse (HCC)    Phreesia 05/14/2020   Past Surgical History:  Procedure Laterality Date   ESOPHAGEAL BANDING  08/30/2019   Procedure: ESOPHAGEAL BANDING;  Surgeon: Kerin Salen, MD;  Location: [facility] ENDOSCOPY;  Service: Gastroenterology;;   ESOPHAGEAL BANDING  10/10/2019   Procedure: ESOPHAGEAL BANDING;  Surgeon: Kerin Salen, MD;  Location: [facility] ENDOSCOPY;  Service: Gastroenterology;;   ESOPHAGEAL BANDING N/A 12/23/2019   Procedure: ESOPHAGEAL BANDING;  Surgeon: Kathi Der, MD;  Location: [facility] ENDOSCOPY;  Service: Gastroenterology;  Laterality: N/A;   ESOPHAGEAL BANDING N/A 01/12/2020   Procedure: ESOPHAGEAL BANDING;   Surgeon: Kathi Der, MD;  Location: [facility] ENDOSCOPY;  Service: Gastroenterology;  Laterality: N/A;   ESOPHAGEAL BANDING N/A 09/10/2020   Procedure: ESOPHAGEAL BANDING;  Surgeon: Benancio Deeds, MD;  Location: [facility] ENDOSCOPY;  Service: Gastroenterology;  Laterality: N/A;   ESOPHAGEAL BANDING  04/16/2021   Procedure: ESOPHAGEAL BANDING;  Surgeon: Imogene Burn, MD;  Location: [facility] ENDOSCOPY;  Service: Gastroenterology;;   ESOPHAGEAL BANDING N/A 05/21/2021   Procedure: ESOPHAGEAL BANDING;  Surgeon: Rachael Fee, MD;  Location: [facility] ENDOSCOPY;  Service: Endoscopy;  Laterality: N/A;   ESOPHAGOGASTRODUODENOSCOPY Left 08/21/2015   Procedure: ESOPHAGOGASTRODUODENOSCOPY (EGD);  Surgeon: Charlott Rakes, MD;  Location: [facility] ENDOSCOPY;  Service: Endoscopy;  Laterality: Left;   ESOPHAGOGASTRODUODENOSCOPY N/A 12/05/2019   Procedure: ESOPHAGOGASTRODUODENOSCOPY (EGD);  Surgeon: Kathi Der, MD;  Location: [facility] ENDOSCOPY;  Service: Gastroenterology;  Laterality: N/A;   ESOPHAGOGASTRODUODENOSCOPY (EGD) WITH PROPOFOL N/A 08/30/2019   Procedure: ESOPHAGOGASTRODUODENOSCOPY (EGD) WITH PROPOFOL;  Surgeon: Kerin Salen, MD;  Location: [facility] ENDOSCOPY;  Service: Gastroenterology;  Laterality: N/A;   ESOPHAGOGASTRODUODENOSCOPY (EGD) WITH PROPOFOL N/A 10/10/2019   Procedure: ESOPHAGOGASTRODUODENOSCOPY (EGD) WITH PROPOFOL;  Surgeon: Kerin Salen, MD;  Location: [facility] ENDOSCOPY;  Service: Gastroenterology;  Laterality: N/A;   ESOPHAGOGASTRODUODENOSCOPY (EGD) WITH PROPOFOL N/A 12/23/2019   Procedure: ESOPHAGOGASTRODUODENOSCOPY (EGD) WITH PROPOFOL;  Surgeon: Kathi Der, MD;  Location: [facility] ENDOSCOPY;  Service: Gastroenterology;  Laterality: N/A;   ESOPHAGOGASTRODUODENOSCOPY (EGD) WITH PROPOFOL N/A 01/12/2020   Procedure: ESOPHAGOGASTRODUODENOSCOPY (EGD) WITH PROPOFOL;  Surgeon: Kathi Der, MD;  Location: [facility] ENDOSCOPY;  Service: Gastroenterology;  Laterality: N/A;   ESOPHAGOGASTRODUODENOSCOPY (EGD) WITH PROPOFOL N/A  09/10/2020   Procedure: ESOPHAGOGASTRODUODENOSCOPY (EGD) WITH PROPOFOL;  Surgeon: Benancio Deeds, MD;  Location: [facility] ENDOSCOPY;  Service: Gastroenterology;  Laterality: N/A;   ESOPHAGOGASTRODUODENOSCOPY (EGD) WITH PROPOFOL N/A 04/16/2021   Procedure: ESOPHAGOGASTRODUODENOSCOPY (EGD) WITH PROPOFOL;  Surgeon: Imogene Burn, MD;  Location: [facility] ENDOSCOPY;  Service: Gastroenterology;  Laterality: N/A;   ESOPHAGOGASTRODUODENOSCOPY (EGD) WITH PROPOFOL N/A 05/21/2021   Procedure: ESOPHAGOGASTRODUODENOSCOPY (EGD) WITH PROPOFOL;  Surgeon: Rachael Fee, MD;  Location: [facility] ENDOSCOPY;  Service: Endoscopy;  Laterality: N/A;   ESOPHAGOGASTRODUODENOSCOPY (EGD) WITH PROPOFOL N/A 08/01/2021   Procedure: ESOPHAGOGASTRODUODENOSCOPY (EGD) WITH PROPOFOL;  Surgeon: Benancio Deeds, MD;  Location: [facility] ENDOSCOPY;  Service: Gastroenterology;  Laterality: N/A;   GASTRIC VARICES BANDING  12/05/2019   Procedure: GASTRIC VARICES BANDING;  Surgeon: Kathi Der, MD;  Location: [facility] ENDOSCOPY;  Service: Gastroenterology;;  placed 6 bands   HOT HEMOSTASIS N/A 12/05/2019   Procedure: HOT HEMOSTASIS (ARGON PLASMA COAGULATION/BICAP);  Surgeon: Kathi Der, MD;  Location: [facility] ENDOSCOPY;  Service: Gastroenterology;  Laterality: N/A;   IR RADIOLOGIST EVAL & MGMT  12/13/2019   Social History:  reports that he has quit smoking. His smoking use included cigarettes. He smoked an average of 1 pack per day. He has never used smokeless tobacco. He reports that he does not currently use alcohol after a past usage of about 3.0 standard drinks of alcohol per week. He reports current drug use. Frequency: 7.00 times per week. Drug: Marijuana.  Allergies  Allergen Reactions   Penicillins Hives    Infant    Family History  Problem Relation Age of Onset   Peptic Ulcer Paternal Aunt    Crohn's disease Maternal Uncle    Heart attack Maternal Uncle    Heart attack Maternal Grandfather    Stroke Maternal Grandmother    Stroke  Paternal Grandfather     Prior to Admission medications   Medication Sig Start Date End Date Taking? Authorizing Provider  Ferrous Sulfate (IRON) 325 (65 Fe) MG TABS Take 1 tablet (325 mg total) by mouth daily. 09/02/19   Regalado, Belkys A, MD  folic acid (FOLVITE) 400 MCG tablet Take 400 mcg by mouth daily.    [provider]  hydrocortisone cream 1 % Apply 1 application topically daily as needed for itching.    [provider]  hydrOXYzine (VISTARIL) 25 MG capsule Take 1 capsule (25 mg total) by mouth every 8 (eight) hours as needed. 12/24/21   Rema Fendt, NP  lactulose (CHRONULAC) 10 GM/15ML solution Take 22.5 mLs (15 g total) by mouth in the morning and at bedtime. 12/04/21   Armbruster, Willaim Rayas, MD  Magnesium 500 MG TABS Take 500 mg by mouth in the morning and at bedtime.    [provider]  Multiple Vitamin (MULTIVITAMIN WITH MINERALS) TABS tablet Take 1 tablet by mouth daily. 07/19/19   Johnson, Clanford L, MD  nadolol (CORGARD) 20 MG tablet TAKE 1 TABLET (20 MG TOTAL) BY MOUTH DAILY. 03/12/21   Armbruster, Willaim Rayas, MD  oxymetazoline (AFRIN) 0.05 % nasal spray Place 1 spray into both nostrils 2 (two) times daily as needed for congestion.    [provider]  pantoprazole (PROTONIX) 40 MG tablet Take 1 tablet (40 mg total) by mouth 2 (two) times daily before a meal. 09/17/21   Armbruster, Willaim Rayas, MD  sertraline (ZOLOFT) 100 MG tablet Take 1 tablet (100 mg total) by mouth daily. 12/24/21   Rema Fendt, NP  thiamine (VITAMIN B-1) 100 MG tablet Take  100 mg by mouth daily.    [provider]    Physical Exam: Vitals:   12/30/21 1145 12/30/21 1230 12/30/21 1245 12/30/21 1315  BP: 125/85 124/85 124/78 124/79  Pulse: 91 86 88 86  Resp: 15   16  Temp:      SpO2: 96% 98% 94% 97%   General: 41 y.o. male resting in bed in NAD Eyes: PERRL, normal sclera ENMT: Nares patent w/o discharge, orophaynx clear, dentition normal, ears w/o  discharge/lesions/ulcers Neck: Supple, trachea midline Cardiovascular: RRR, +S1, S2, no m/g/r, equal pulses throughout Respiratory: CTABL, no w/r/r, normal WOB GI: BS+, NDNT, no masses noted, no organomegaly noted MSK: No e/c/c Neuro: A&O x 3, no focal deficits Psyc: Appropriate interaction and affect, calm/cooperative  Data Reviewed:  Lab Results  Component Value Date   NA 132 (L) 12/30/2021   K 3.7 12/30/2021   CO2 22 12/30/2021   GLUCOSE 102 (H) 12/30/2021   BUN 14 12/30/2021   CREATININE 0.65 12/30/2021   CALCIUM 9.7 12/30/2021   GFRNONAA >60 12/30/2021   Lab Results  Component Value Date   WBC 10.1 12/30/2021   HGB 15.2 12/30/2021   HCT 42.6 12/30/2021   MCV 92.2 12/30/2021   PLT 185 12/30/2021     Assessment and Plan: GIB     - admit to inpt, progressive     - continue octreotide, protonix, rocephin     - LBGI consulted, appreciate assistance     - NPO til eval'd by GI     - fluids, anti-emetics     - trend q6h H&H; transfuse for Hgb < 7  Hx of alcoholic cirrhosis Elevated LFTs     - check RUQ [place] ab     - continue home regimen when off NPO status  Anxiety/depression     - continue home regimen when off NPO status  Hyponatremia     - mild, continue fluids  GERD     - PPI  Advance Care Planning:   Code Status: FULL  Consults: LBGI  Family Communication: w/ mom at bedside  Severity of Illness: The appropriate patient status for this patient is INPATIENT. Inpatient status is judged to be reasonable and necessary in order to provide the required intensity of service to ensure the patient's safety. The patient's presenting symptoms, physical exam findings, and initial radiographic and laboratory data in the context of their chronic comorbidities is felt to place them at high risk for further clinical deterioration. Furthermore, it is not anticipated that the patient will be medically stable for discharge from the hospital within 2 midnights of admission.    * I certify that at the point of admission it is my clinical judgment that the patient will require inpatient hospital care spanning beyond 2 midnights from the point of admission due to high intensity of service, high risk for further deterioration and high frequency of surveillance required.*  Time spent in coordination of this H&P: 61 minutes  Author: Teddy Spike, DO 12/30/2021 2:11 PM  For on call review [email or web address].

## 2021-12-30 NOTE — ED Provider Notes (Signed)
[place] [facility]TH FLOOR PROGRESSIVE CARE AND UROLOGY Provider Note   CSN: [id number] Arrival date & time: 12/30/21  0944     History  Chief Complaint  Patient presents with   Hematemesis    TERON BLAIS is a 41 y.o. male.  HPI     41yo male with history of alcoholic cirrhosis, esophageal varices, seizures, who presents with concern for hematemesis.  Reports having episode last night of emesis with dark red blood. Denies having emesis before this.  This morning, had another episode of hematemesis on the way here.  Had one black stool today.  Reports feeling sensation of tingling all over like he has low Mg. Drank etoh again 2 days ago then stopped. Has hx of withdrawal in distant past. Does not believe he is in withdrawal now. Mild headache. No abdominal pain. No diarrhea/constipation. Mild lightheadedness. Sees [facility].   Past Medical History:  Diagnosis Date   Anxiety    Phreesia 05/14/2020   Blood transfusion without reported diagnosis    Phreesia 05/14/2020   Cirrhosis of liver (HCC)    Depression    Phreesia 05/14/2020   Esophageal varices with hemorrhage (HCC)    ETOH abuse    GERD (gastroesophageal reflux disease)    Seizures (HCC)    Phreesia 05/14/2020   Substance abuse (HCC)    Phreesia 05/14/2020     Home Medications Prior to Admission medications   Medication Sig Start Date End Date Taking? Authorizing Provider  Ferrous Sulfate (IRON) 325 (65 Fe) MG TABS Take 1 tablet (325 mg total) by mouth daily. Patient taking differently: Take 325 mg by mouth daily with breakfast. 09/02/19  Yes Regalado, Belkys A, MD  folic acid (FOLVITE) 400 MCG tablet Take 400 mcg by mouth daily.   Yes [provider]  hydrocortisone cream 1 % Apply 1 application topically daily as needed for itching.   Yes [provider]  hydrOXYzine (VISTARIL) 25 MG capsule Take 1 capsule (25 mg total) by mouth every 8 (eight) hours as needed. Patient taking differently:  Take 25 mg by mouth every 8 (eight) hours as needed for anxiety. 12/24/21  Yes Zonia Kief, Amy J, NP  lactulose (CHRONULAC) 10 GM/15ML solution Take 22.5 mLs (15 g total) by mouth in the morning and at bedtime. 12/04/21  Yes Armbruster, Willaim Rayas, MD  Magnesium 500 MG TABS Take 500 mg by mouth in the morning and at bedtime.   Yes [provider]  Multiple Vitamin (MULTIVITAMIN WITH MINERALS) TABS tablet Take 1 tablet by mouth daily. Patient taking differently: Take 1 tablet by mouth daily with breakfast. 07/19/19  Yes Johnson, Clanford L, MD  nadolol (CORGARD) 20 MG tablet TAKE 1 TABLET (20 MG TOTAL) BY MOUTH DAILY. Patient taking differently: Take 20 mg by mouth daily. 03/12/21  Yes Armbruster, Willaim Rayas, MD  oxymetazoline (AFRIN) 0.05 % nasal spray Place 1 spray into both nostrils 2 (two) times daily as needed for congestion.   Yes [provider]  pantoprazole (PROTONIX) 40 MG tablet Take 1 tablet (40 mg total) by mouth 2 (two) times daily before a meal. 09/17/21  Yes Armbruster, Willaim Rayas, MD  sertraline (ZOLOFT) 100 MG tablet Take 1 tablet (100 mg total) by mouth daily. 12/24/21  Yes Zonia Kief, Amy J, NP  thiamine (VITAMIN B-1) 100 MG tablet Take 100 mg by mouth daily.   Yes [provider]      Allergies    Penicillins    Review of Systems  Review of Systems  Physical Exam Updated Vital Signs BP 120/73 (BP Location: Left Arm)   Pulse 81   Temp 98.3 F (36.8 C) (Oral)   Resp (!) 21   Ht 5\' 7"  (1.702 m)   Wt 60.1 kg   SpO2 98%   BMI 20.75 kg/m  Physical Exam  ED Results / Procedures / Treatments   Labs (all labs ordered are listed, but only abnormal results are displayed) Labs Reviewed  COMPREHENSIVE METABOLIC PANEL - Abnormal; Notable for the following components:      Result Value   Sodium 132 (*)    Chloride 88 (*)    Glucose, Bld 102 (*)    Total Protein 8.3 (*)    AST 91 (*)    ALT 54 (*)    Total Bilirubin 2.5 (*)    Anion gap 22 (*)    All  other components within normal limits  URINALYSIS, ROUTINE W REFLEX MICROSCOPIC - Abnormal; Notable for the following components:   Specific Gravity, Urine 1.034 (*)    Ketones, ur >80 (*)    Protein, ur 30 (*)    All other components within normal limits  PROTIME-INR - Abnormal; Notable for the following components:   Prothrombin Time 16.3 (*)    INR 1.3 (*)    All other components within normal limits  AMMONIA - Abnormal; Notable for the following components:   Ammonia 52 (*)    All other components within normal limits  OCCULT BLOOD X 1 CARD TO LAB, STOOL - Abnormal; Notable for the following components:   Fecal Occult Bld POSITIVE (*)    All other components within normal limits  CBC  LIPASE, BLOOD  MAGNESIUM  HIV ANTIBODY (ROUTINE TESTING W REFLEX)  HEMOGLOBIN AND HEMATOCRIT, BLOOD  HEMOGLOBIN AND HEMATOCRIT, BLOOD  COMPREHENSIVE METABOLIC PANEL  CBC    EKG None  Radiology Abdomen Limited RUQ (LIVER/GB)  Result Date: 12/30/2021 CLINICAL DATA:  Abnormal liver function tests EXAM: ULTRASOUND ABDOMEN LIMITED RIGHT UPPER QUADRANT COMPARISON:  08/19/2021 FINDINGS: Gallbladder: No gallstones or wall thickening visualized. No sonographic Murphy sign noted by sonographer. Common bile duct: Diameter: 2 mm Liver: There is inhomogeneous increased echogenicity. There is nodularity in liver surface. No focal abnormalities are seen in visualized portions of liver. Portal vein is patent on color Doppler imaging with normal direction of blood flow towards the liver. Other: None. IMPRESSION: Increased echogenicity and nodularity in the surface in the liver suggest cirrhosis. No focal abnormalities are seen in the visualized portions of liver. No sonographic abnormalities are seen in gallbladder. There is no dilation of bile ducts. Electronically Signed   By: 08/21/2021 M.D.   On: 12/30/2021 16:23    Procedures Procedures    Medications Ordered in ED Medications  octreotide  (SANDOSTATIN) 2 mcg/mL load via infusion 100 mcg (100 mcg Intravenous Bolus from Bag 12/30/21 1114)    And  octreotide (SANDOSTATIN) 500 mcg in sodium chloride 0.9 % 250 mL (2 mcg/mL) infusion (50 mcg/hr Intravenous New Bag/Given 12/30/21 1934)  pantoprozole (PROTONIX) 80 mg /NS 100 mL infusion (8 mg/hr Intravenous New Bag/Given 12/30/21 2001)  prochlorperazine (COMPAZINE) injection 10 mg (has no administration in time range)  0.9 %  sodium chloride infusion ( Intravenous New Bag/Given 12/30/21 1618)  cefTRIAXone (ROCEPHIN) 2 g in sodium chloride 0.9 % 100 mL IVPB (has no administration in time range)  Oral care mouth rinse (has no administration in time range)  ondansetron (ZOFRAN) injection 4 mg (4 mg  Intravenous Given 12/30/21 1118)  pantoprazole (PROTONIX) 80 mg /NS 100 mL IVPB (0 mg Intravenous Stopped 12/30/21 1224)  cefTRIAXone (ROCEPHIN) 2 g in sodium chloride 0.9 % 100 mL IVPB (2 g Intravenous New Bag/Given 12/30/21 1226)    ED Course/ Medical Decision Making/ A&P                            41yo male with history of alcoholic cirrhosis, esophageal varices, seizures, who presents with concern for hematemesis.  DDx includes PUD, bleeding varices, mallory-weiss tear.  Hemodynamically stable. Hgb WNL.  No melena on rectal exam, however scant sample hemoccult positive. Does not appear to be in withdrawal. Ammonia not significantly elevated, no signs of hepatic encephalopathy.  Given history of variceal bleeding and description of symptoms, initiated octreotide, pantoprazole, rocephin.  Consulted [facility].  Will admit for continued care.        Final Clinical Impression(s) / ED Diagnoses Final diagnoses:  Upper GI bleed    Rx / DC Orders ED Discharge Orders     None         Alvira Monday, MD 12/30/21 2204

## 2021-12-30 NOTE — Progress Notes (Signed)
Plan of Care Note for accepted transfer   Patient: [name]: [id number]   DOA: 12/30/2021  Facility requesting transfer: [facility] Requesting Provider: Dr. Dalene Seltzer Reason for transfer: GIB Facility course: 41 yo M w/ history of alcoholic cirrhosis, esophageal varices, anxiety/depression. Presenting with upper GIB. Patient had hematemesis multiple times over the last 24 hours. He's hemodynamically stable. He's FOBT positive. He was started on protonix, octreotide and rocephin. [facility] is aware and will see in consult.   Plan of care: The patient is accepted for admission to Progressive unit, at [facility].  While holding at [facility], medical decision making responsibilities remain with the EDP. Upon arrival to [facility], [facility] will assume care. Thank you.  Author: Teddy Spike, DO 12/30/2021  Check [email or web address] for on-call coverage.  Nursing staff, Please call [facility] Admits & Consults System-Wide number on Amion as soon as patient's arrival, so appropriate admitting provider can evaluate the pt.

## 2021-12-30 NOTE — ED Triage Notes (Signed)
Pt reports dark red vomitus and black stools since yesterday. H/o esophageal varices and low Mag levels.  States bilateral upper extremity muscle tightening since this morning.  Emetis x2 since yesterday. Pt took "1 pill" Mag supplement this morning before coming in.

## 2021-12-30 NOTE — ED Notes (Signed)
Patient is actively vomiting blood at this time. Patient has had multiple bouts of emesis prior to this RN giving zofran and starting the protonix. Will continue to monitor.

## 2021-12-30 NOTE — Plan of Care (Signed)
  Problem: Activity: Goal: Risk for activity intolerance will decrease Outcome: Progressing   Problem: Coping: Goal: Level of anxiety will decrease Outcome: Progressing   Problem: Pain Managment: Goal: General experience of comfort will improve Outcome: Progressing   Problem: Safety: Goal: Ability to remain free from injury will improve Outcome: Progressing   Problem: Education: Goal: Knowledge of General Education information will improve Description: Including pain rating scale, medication(s)/side effects and non-pharmacologic comfort measures Outcome: Completed/Met

## 2021-12-31 DIAGNOSIS — K7030 Alcoholic cirrhosis of liver without ascites: Secondary | ICD-10-CM

## 2021-12-31 DIAGNOSIS — K219 Gastro-esophageal reflux disease without esophagitis: Secondary | ICD-10-CM | POA: Diagnosis not present

## 2021-12-31 DIAGNOSIS — F418 Other specified anxiety disorders: Secondary | ICD-10-CM

## 2021-12-31 DIAGNOSIS — E871 Hypo-osmolality and hyponatremia: Secondary | ICD-10-CM

## 2021-12-31 DIAGNOSIS — I8510 Secondary esophageal varices without bleeding: Secondary | ICD-10-CM | POA: Diagnosis not present

## 2021-12-31 DIAGNOSIS — K922 Gastrointestinal hemorrhage, unspecified: Secondary | ICD-10-CM | POA: Diagnosis not present

## 2021-12-31 DIAGNOSIS — K920 Hematemesis: Principal | ICD-10-CM

## 2021-12-31 LAB — CBC
HCT: 38.3 % — ABNORMAL LOW (ref 39.0–52.0)
Hemoglobin: 13.5 g/dL (ref 13.0–17.0)
MCH: 33.8 pg (ref 26.0–34.0)
MCHC: 35.2 g/dL (ref 30.0–36.0)
MCV: 96 fL (ref 80.0–100.0)
Platelets: 122 10*3/uL — ABNORMAL LOW (ref 150–400)
RBC: 3.99 MIL/uL — ABNORMAL LOW (ref 4.22–5.81)
RDW: 12.8 % (ref 11.5–15.5)
WBC: 6.9 10*3/uL (ref 4.0–10.5)
nRBC: 0 % (ref 0.0–0.2)

## 2021-12-31 LAB — COMPREHENSIVE METABOLIC PANEL
ALT: 54 U/L — ABNORMAL HIGH (ref 0–44)
AST: 87 U/L — ABNORMAL HIGH (ref 15–41)
Albumin: 3.8 g/dL (ref 3.5–5.0)
Alkaline Phosphatase: 45 U/L (ref 38–126)
Anion gap: 13 (ref 5–15)
BUN: 15 mg/dL (ref 6–20)
CO2: 28 mmol/L (ref 22–32)
Calcium: 8.8 mg/dL — ABNORMAL LOW (ref 8.9–10.3)
Chloride: 97 mmol/L — ABNORMAL LOW (ref 98–111)
Creatinine, Ser: 0.85 mg/dL (ref 0.61–1.24)
GFR, Estimated: >60 mL/min (ref >60)
Glucose, Bld: 108 mg/dL — ABNORMAL HIGH (ref 70–99)
Potassium: 4.2 mmol/L (ref 3.5–5.1)
Sodium: 138 mmol/L (ref 135–145)
Total Bilirubin: 3.1 mg/dL — ABNORMAL HIGH (ref 0.3–1.2)
Total Protein: 7.3 g/dL (ref 6.5–8.1)

## 2021-12-31 LAB — HEMOGLOBIN AND HEMATOCRIT, BLOOD
HCT: 35.3 % — ABNORMAL LOW (ref 39.0–52.0)
HCT: 39.3 % (ref 39.0–52.0)
Hemoglobin: 12 g/dL — ABNORMAL LOW (ref 13.0–17.0)
Hemoglobin: 13.4 g/dL (ref 13.0–17.0)

## 2021-12-31 MED ORDER — METOCLOPRAMIDE HCL 5 MG/ML IJ SOLN: 10.0000 mg | Freq: Once | INTRAMUSCULAR | Status: DC

## 2021-12-31 MED ORDER — SODIUM CHLORIDE 0.9 % IV SOLN: INTRAVENOUS | Status: DC

## 2021-12-31 MED ORDER — METOCLOPRAMIDE HCL 5 MG/ML IJ SOLN
10.0000 mg | Freq: Once | INTRAMUSCULAR | Status: DC
  Filled 2021-12-31: qty 2

## 2021-12-31 MED ORDER — NADOLOL 20 MG PO TABS
20.0000 mg | ORAL_TABLET | Freq: Every day | ORAL | Status: DC
  Administered 2021-12-31: 20 mg via ORAL
  Filled 2021-12-31 (×2): qty 1

## 2021-12-31 MED ORDER — HYDROXYZINE HCL 10 MG PO TABS: 25.0000 mg | ORAL_TABLET | Freq: Three times a day (TID) | ORAL | Status: DC | PRN

## 2021-12-31 MED ORDER — LACTULOSE 10 GM/15ML PO SOLN
20.0000 g | Freq: Two times a day (BID) | ORAL | Status: DC
  Administered 2021-12-31 (×2): 20 g via ORAL
  Filled 2021-12-31 (×2): qty 30

## 2021-12-31 MED ORDER — SERTRALINE HCL 100 MG PO TABS
100.0000 mg | ORAL_TABLET | Freq: Every day | ORAL | Status: DC
  Administered 2021-12-31: 100 mg via ORAL
  Filled 2021-12-31: qty 1

## 2021-12-31 NOTE — H&P (View-Only) (Signed)
Consultation  Referring Provider:   [facility] Primary Care Physician:  Rema Fendt, NP Primary Gastroenterologist:  Dr. Adela Lank       Reason for Consultation:     Hematemesis in setting of alcoholic cirrhosis with history of variceal bleeding   Impression and Plan:   Decompensated cirrhosis secondary to ETOH WBC 6.9 HGB 13.5 MCV 96.0 Platelets 122 AST 87 ALT 54  Alkphos 45 TBili 3.1 GFR >60  INR 1.3 MELD 3.0: 14 at 12/31/2021 12:15 AM MELD-Na: 14 at 12/31/2021 12:15 AM Calculated from: Serum Creatinine: 0.85 mg/dL (Using min of 1 mg/dL) at 0/05/7739 28:78 AM Serum Sodium: 138 mmol/L (Using max of 137 mmol/L) at 12/31/2021 12:15 AM Total Bilirubin: 3.1 mg/dL at 10/03/6718 94:70 AM Serum Albumin: 3.8 g/dL (Using max of 3.5 g/dL) at 01/02/2835 62:94 AM INR(ratio): 1.3 at 12/30/2021 10:33 AM Age at listing (hypothetical): 41 years Sex: Male at 12/31/2021 12:15 AM  -Daily CBC, CMET, INR -Alcohol Abstinence counseling discussed with patient as continued use is strongly associated with worsening liver disease progression  - per patient a year ago was last ETOH but also states had some "by mistake" a few days ago [name] SCREEN: 12/30/2021 showed cirrhosis with no focal abnormalities, normal gallbladder and no ductal dilatation.  Esophageal Varices with suspected bleed Last EGD 07/2021 with Dr. Adela Lank that showed grade 1 varices however prior January 2023 and December 2022 endoscopies had 7 and 6 varices banded -IV Protonix  Drip -IV Octreotide Drip @50  mcg/hr -IV Ceftriaxone if varices noted then will continue treatment for at least 5 days -Will plan for tentative EGD tomorrow due to restrictions from anesthesia -Clear liquid diet today, n.p.o. at midnight -Would give 10 mg IV Reglan 30 minutes prior to endoscopy I discussed risks of EGD with patient today, including risk of sedation, bleeding or perforation. Patient provides understanding and gave verbal consent to proceed.  -Trend  Hgb/Hct  -Continue volume resuscitation with Hgb goal > 7 (higher Hgb rates have been associated with worsened mortality in the setting of portal hypertensive variceal bleeds); unless active bleeding is occuring -INR Goal <2.0 (may proceed with FFP if necessary and give IV Vitamin K 10 to optimize prior to procedure) -Monitor I/Os closely  without Coagulopathy 12/30/2021 INR 1.3 Check PT/INR daily (Vitamin K/FFP if elevated for procedure)  Hepatic Encephalopathy:  12/30/2021 Ammonia 52 -There is no evidence of encephalopathy - Lactulose 72ml BID titrate to 3bms per day- will add on to med list- has not had BM in 2-3 days.  -Patient advised to not drive due to impaired reflexes with history of HE  Ascites No evidence of ascites on exam.   Thank you for your kind consultation, we will continue to follow.   Attending Physician Note   I have taken a history, reviewed the chart and examined the patient. I performed more than 50% of this encounter in conjunction with the APP. I agree with the APP's note, impression and recommendations with my edits. My additional impressions and recommendations are as follows.   Hematemesis. Esophageal varices previously banded. R/O varices, portal gastropathy, MW tear, ulcer, other source. IV Protonix infusion, IV octreotide infusion, IV Ceftriaxone. Monitor CBC. EGD tomorrow.  Decompensated etoh cirrhosis with varices, HE. MELD=14. Continue lactulose. Treated with nadolol as outpatient. Avoid all alcohol.   30m, MD [facility] See [facility] RAFAEL LOPEZ NUSSA, [facility], for our on call provider            HPI:   James Gautreau  Best is a 41 y.o. male with past medical history significant for alcoholic cirrhosis, history of variceal bleeding, history of hepatic encephalopathy presents with hematemesis.  Patient was last seen in the office 03/12/2021 with Dr. Armbruster.  Patient had longstanding history of esophageal varices, first variceal bleed 2017.  Has had multiple  admissions for variceal bleeding and EGDs since that time.  Last admission for variceal bleeding 2021 requiring blood transfusions, last endoscopy was 07/2021 with Dr. Armbruster that showed grade 1 varices however prior January 2023 and December 2022 endoscopies had 7 and 6 varices banded Patient is being considered for TIPS in the past but never had this done. Patient's been on nadolol 20 mg daily. She is on lactulose daily for hepatic encephalopathy. Patient is also been evaluated by [facility] hepatology however MELD score has not been enough to consider as candidate.  Patient states she reported 2 AM yesterday morning onset of sudden hematemesis 3 times with 1 episode black stools. Patient denies any nausea prior to this, did have some dizziness with it but denies chest pain, shortness of breath, fever, chills.  Denies swelling in abdomen or lower legs. Patient denies jaundice. Denies NSAIDs. Has not had any alcohol in at least a year.  In prior ER note states had alcohol 2 days ago when questioned, patient states he thought it was a beverage without alcohol but drank it and it was but never drink any further. Denies any abdominal pain. Has been compliant with taking nadolol 20 mg and lactulose 20 mils twice daily.  Denies any confusion.  In the ER patient had AST 91, ALT 54, total bilirubin 2.5, sodium 132, INR 1.3, ammonia 52, fecal occult positive patient was hemodynamically stable, started on  octreotide, pantoprazole, rocephin.  Patient's initial hemoglobin 15.2 currently at 13.5, platelets 122 No elevation of BUN, creatinine 0.85.  08/01/2021 endoscopy with Dr. Armbruster showed grade 1 disease that flattened with insufflation, normal stomach and duodenum.  Did show portal hypertensive gastropathy 05/21/2021 Endoscopy with Dr. Jacobs medium size esophageal varices distal mid esophagus 7 ligating bands complete eradication mild portal gastropathy 04/16/2021 endoscopy with Dr. Dorsey 6 ligating  bands completely eradicated  Abnormal ED labs: Abnormal Labs Reviewed  COMPREHENSIVE METABOLIC PANEL - Abnormal; Notable for the following components:      Result Value   Sodium 132 (*)    Chloride 88 (*)    Glucose, Bld 102 (*)    Total Protein 8.3 (*)    AST 91 (*)    ALT 54 (*)    Total Bilirubin 2.5 (*)    Anion gap 22 (*)    All other components within normal limits  URINALYSIS, ROUTINE W REFLEX MICROSCOPIC - Abnormal; Notable for the following components:   Specific Gravity, Urine 1.034 (*)    Ketones, ur >80 (*)    Protein, ur 30 (*)    All other components within normal limits  PROTIME-INR - Abnormal; Notable for the following components:   Prothrombin Time 16.3 (*)    INR 1.3 (*)    All other components within normal limits  AMMONIA - Abnormal; Notable for the following components:   Ammonia 52 (*)    All other components within normal limits  OCCULT BLOOD X 1 CARD TO LAB, STOOL - Abnormal; Notable for the following components:   Fecal Occult Bld POSITIVE (*)    All other components within normal limits  COMPREHENSIVE METABOLIC PANEL - Abnormal; Notable for the following components:   Chloride 97 (*)      Glucose, Bld 108 (*)    Calcium 8.8 (*)    AST 87 (*)    ALT 54 (*)    Total Bilirubin 3.1 (*)    All other components within normal limits  CBC - Abnormal; Notable for the following components:   RBC 3.99 (*)    HCT 38.3 (*)    Platelets 122 (*)    All other components within normal limits     Past Medical History:  Diagnosis Date   Anxiety    Phreesia 05/14/2020   Blood transfusion without reported diagnosis    Phreesia 05/14/2020   Cirrhosis of liver (HCC)    Depression    Phreesia 05/14/2020   Esophageal varices with hemorrhage (HCC)    ETOH abuse    GERD (gastroesophageal reflux disease)    Seizures (HCC)    Phreesia 05/14/2020   Substance abuse (HCC)    Phreesia 05/14/2020    Surgical History:  He  has a past surgical history that  includes Esophagogastroduodenoscopy (Left, 08/21/2015); Esophagogastroduodenoscopy (egd) with propofol (N/A, 08/30/2019); esophageal banding (08/30/2019); Esophagogastroduodenoscopy (egd) with propofol (N/A, 10/10/2019); esophageal banding (10/10/2019); Esophagogastroduodenoscopy (N/A, 12/05/2019); Hot hemostasis (N/A, 12/05/2019); gastric varices banding (12/05/2019); IR Radiologist Eval & Mgmt (12/13/2019); Esophagogastroduodenoscopy (egd) with propofol (N/A, 12/23/2019); esophageal banding (N/A, 12/23/2019); Esophagogastroduodenoscopy (egd) with propofol (N/A, 01/12/2020); esophageal banding (N/A, 01/12/2020); Esophagogastroduodenoscopy (egd) with propofol (N/A, 09/10/2020); esophageal banding (N/A, 09/10/2020); Esophagogastroduodenoscopy (egd) with propofol (N/A, 04/16/2021); esophageal banding (04/16/2021); Esophagogastroduodenoscopy (egd) with propofol (N/A, 05/21/2021); esophageal banding (N/A, 05/21/2021); and Esophagogastroduodenoscopy (egd) with propofol (N/A, 08/01/2021). Family History:  His family history includes Crohn's disease in his maternal uncle; Heart attack in his maternal grandfather and maternal uncle; Peptic Ulcer in his paternal aunt; Stroke in his maternal grandmother and paternal grandfather. Social History:   reports that he has quit smoking. His smoking use included cigarettes. He smoked an average of 1 pack per day. He has never used smokeless tobacco. He reports that he does not currently use alcohol after a past usage of about 3.0 standard drinks of alcohol per week. He reports current drug use. Frequency: 7.00 times per week. Drug: Marijuana.  Prior to Admission medications   Medication Sig Start Date End Date Taking? Authorizing Provider  Ferrous Sulfate (IRON) 325 (65 Fe) MG TABS Take 1 tablet (325 mg total) by mouth daily. Patient taking differently: Take 325 mg by mouth daily with breakfast. 09/02/19  Yes Regalado, Belkys A, MD  folic acid (FOLVITE) 400 MCG tablet Take 400 mcg by mouth daily.    Yes [provider]  hydrocortisone cream 1 % Apply 1 application topically daily as needed for itching.   Yes [provider]  hydrOXYzine (VISTARIL) 25 MG capsule Take 1 capsule (25 mg total) by mouth every 8 (eight) hours as needed. Patient taking differently: Take 25 mg by mouth every 8 (eight) hours as needed for anxiety. 12/24/21  Yes Zonia Kief, Amy J, NP  lactulose (CHRONULAC) 10 GM/15ML solution Take 22.5 mLs (15 g total) by mouth in the morning and at bedtime. 12/04/21  Yes Armbruster, Willaim Rayas, MD  Magnesium 500 MG TABS Take 500 mg by mouth in the morning and at bedtime.   Yes [provider]  Multiple Vitamin (MULTIVITAMIN WITH MINERALS) TABS tablet Take 1 tablet by mouth daily. Patient taking differently: Take 1 tablet by mouth daily with breakfast. 07/19/19  Yes Johnson, Clanford L, MD  nadolol (CORGARD) 20 MG tablet TAKE 1 TABLET (20 MG TOTAL) BY MOUTH DAILY. Patient taking  differently: Take 20 mg by mouth daily. 03/12/21  Yes Armbruster, Willaim Rayas, MD  oxymetazoline (AFRIN) 0.05 % nasal spray Place 1 spray into both nostrils 2 (two) times daily as needed for congestion.   Yes [provider]  pantoprazole (PROTONIX) 40 MG tablet Take 1 tablet (40 mg total) by mouth 2 (two) times daily before a meal. 09/17/21  Yes Armbruster, Willaim Rayas, MD  sertraline (ZOLOFT) 100 MG tablet Take 1 tablet (100 mg total) by mouth daily. 12/24/21  Yes Zonia Kief, Amy J, NP  thiamine (VITAMIN B-1) 100 MG tablet Take 100 mg by mouth daily.   Yes [provider]    Current Facility-Administered Medications  Medication Dose Route Frequency Provider Last Rate Last Admin   0.9 %  sodium chloride infusion   Intravenous Continuous Ronaldo Miyamoto, Tyrone A, DO 75 mL/hr at 12/31/21 0449 New Bag at 12/31/21 0449   0.9 %  sodium chloride infusion   Intravenous Continuous Armbruster, Willaim Rayas, MD       cefTRIAXone (ROCEPHIN) 2 g in sodium chloride 0.9 % 100 mL IVPB  2 g Intravenous Q24H  Ronaldo Miyamoto, Tyrone A, DO       octreotide (SANDOSTATIN) 500 mcg in sodium chloride 0.9 % 250 mL (2 mcg/mL) infusion  50 mcg/hr Intravenous Continuous Ronaldo Miyamoto, Tyrone A, DO 25 mL/hr at 12/31/21 0613 50 mcg/hr at 12/31/21 6803   Oral care mouth rinse  15 mL Mouth Rinse PRN Ronaldo Miyamoto, Tyrone A, DO       pantoprozole (PROTONIX) 80 mg /NS 100 mL infusion  8 mg/hr Intravenous Continuous Ronaldo Miyamoto, Tyrone A, DO 10 mL/hr at 12/31/21 2122 8 mg/hr at 12/31/21 4825   prochlorperazine (COMPAZINE) injection 10 mg  10 mg Intravenous Q6H PRN Margie Ege A, DO        Allergies as of 12/30/2021 - Review Complete 12/30/2021  Allergen Reaction Noted   Penicillins Hives and Other (See Comments) 05/26/2018    Review of Systems:    Constitutional: No weight loss, fever, chills, weakness or fatigue HEENT: Eyes: No change in vision               Ears, Nose, Throat:  No change in hearing or congestion Skin: No rash or itching Cardiovascular: No chest pain, chest pressure or palpitations   Respiratory: No SOB or cough Gastrointestinal: See HPI and otherwise negative Genitourinary: No dysuria or change in urinary frequency Neurological: No headache, dizziness or syncope Musculoskeletal: No new muscle or joint pain Hematologic: No bleeding or bruising Psychiatric: No history of depression or anxiety     Physical Exam:  Vital signs in last 24 hours: Temp:  [98.2 F (36.8 C)-98.6 F (37 C)] 98.4 F (36.9 C) (09/05 0617) Pulse Rate:  [71-93] 71 (09/05 0617) Resp:  [14-21] 21 (09/05 0617) BP: (112-136)/(66-86) 112/66 (09/05 0617) SpO2:  [94 %-100 %] 97 % (09/05 0617) Weight:  [60.1 kg] 60.1 kg (09/04 1412) Last BM Date : 12/30/21 Last BM recorded by nurses in past 5 days No data recorded  General :  Alert, well developed male in no acute distress Head:  Normocephalic and atraumatic. Eyes :  sclerae anicteric,conjunctive pink  Heart:  regular rate and rhythm Pulm:  Clear anteriorly; no wheezing Abdomen:   Soft, non  distended AB, skin exam normal, Normal bowel sounds.  no  tenderness, without organomegaly. no  fluid wave, no  shifting dullness.  Extremities:   Without edema. Msk:  Symmetrical without gross deformities. Peripheral pulses intact.  Neurologic: Alert and  oriented x4;  grossly normal neurologically. without asterixis or clonus.  Skin:   without jaundice. no palmar erythema or spider angioma.   Psychiatric:  Demonstrates good judgement and reason without abnormal affect or behaviors.  LAB RESULTS: Recent Labs    12/30/21 1033 12/30/21 1759 12/31/21 0015  WBC 10.1  --  6.9  HGB 15.2 14.0 13.5  HCT 42.6 40.3 38.3*  PLT 185  --  122*   BMET Recent Labs    12/30/21 1033 12/31/21 0015  NA 132* 138  K 3.7 4.2  CL 88* 97*  CO2 22 28  GLUCOSE 102* 108*  BUN 14 15  CREATININE 0.65 0.85  CALCIUM 9.7 8.8*   LFT Recent Labs    12/31/21 0015  PROT 7.3  ALBUMIN 3.8  AST 87*  ALT 54*  ALKPHOS 45  BILITOT 3.1*   PT/INR Recent Labs    12/30/21 1033  LABPROT 16.3*  INR 1.3*    STUDIES: US Abdomen Limited RUQ (LIVER/GB)  Result Date: 12/30/2021 CLINICAL DATA:  Abnormal liver function tests EXAM: ULTRASOUND ABDOMEN LIMITED RIGHT UPPER QUADRANT COMPARISON:  08/19/2021 FINDINGS: Gallbladder: No gallstones or wall thickening visualized. No sonographic Murphy sign noted by sonographer. Common bile duct: Diameter: 2 mm Liver: There is inhomogeneous increased echogenicity. There is nodularity in liver surface. No focal abnormalities are seen in visualized portions of liver. Portal vein is patent on color Doppler imaging with normal direction of blood flow towards the liver. Other: None. IMPRESSION: Increased echogenicity and nodularity in the surface in the liver suggest cirrhosis. No focal abnormalities are seen in the visualized portions of liver. No sonographic abnormalities are seen in gallbladder. There is no dilation of bile ducts. Electronically Signed   By: Ernie Avena M.D.    On: 12/30/2021 16:23     [name]  12/31/2021, 8:17 AM

## 2021-12-31 NOTE — Anesthesia Preprocedure Evaluation (Signed)
Anesthesia Evaluation  Patient identified by MRN, date of birth, ID band Patient awake    Reviewed: Allergy & Precautions, NPO status , Patient's Chart, lab work & pertinent test results  History of Anesthesia Complications Negative for: history of anesthetic complications  Airway Mallampati: II  TM Distance: >3 FB Neck ROM: Full    Dental  (+) Poor Dentition, Missing, Chipped,    Pulmonary former smoker,    Pulmonary exam normal        Cardiovascular negative cardio ROS Normal cardiovascular exam     Neuro/Psych Seizures - (EtOH withdrawal), Well Controlled,  Anxiety Depression    GI/Hepatic GERD  Medicated,(+) Cirrhosis     substance abuse (last drank approx 1 month ago)  alcohol use, UGIB   Endo/Other  negative endocrine ROS  Renal/GU negative Renal ROS  negative genitourinary   Musculoskeletal negative musculoskeletal ROS (+)   Abdominal   Peds  Hematology  (+) Blood dyscrasia (Hgb 13.4, Plt 104k, INR 1.3  ), anemia ,   Anesthesia Other Findings Day of surgery medications reviewed with patient.  Reproductive/Obstetrics negative OB ROS                           Anesthesia Physical Anesthesia Plan  ASA: 4  Anesthesia Plan: MAC   Post-op Pain Management: Minimal or no pain anticipated   Induction:   PONV Risk Score and Plan: 1 and Treatment may vary due to age or medical condition and Propofol infusion  Airway Management Planned: Natural Airway and Nasal Cannula  Additional Equipment: None  Intra-op Plan:   Post-operative Plan:   Informed Consent: I have reviewed the patients History and Physical, chart, labs and discussed the procedure including the risks, benefits and alternatives for the proposed anesthesia with the patient or authorized representative who has indicated his/her understanding and acceptance.       Plan Discussed with: CRNA  Anesthesia Plan  Comments:        Anesthesia Quick Evaluation

## 2021-12-31 NOTE — Progress Notes (Signed)
PROGRESS NOTE    [name]  NWG:[id number] DOB: 09/08/1980 DOA: 12/30/2021 PCP: Rema Fendt, NP    Brief Narrative:   [name] is a 41 y.o. male with past medical history significant for EtOH cirrhosis, history of esophageal varices, history of hepatic encephalopathy, anxiety/depression, GERD who presented to [facility] ED on 9/4 with hematemesis and dark stools.  Patient also reports associated dizziness.  Denies current alcohol abuse.  Given recurrence of hematemesis he came to the ED for further evaluation.  Does not take antiplatelets/blood thinners.  In the ED, temperature 98.2 F, HR 77, RR 14, BP 129/86, SPO2 99% on room air.  WBC 10.1, hemoglobin 15.2, platelets 185.  Sodium 132, potassium 3.7, chloride 88, CO2 22, glucose 102, BUN 14, creatinine 0.65.  AST 91, ALT 54, total bilirubin 2.5.  Ammonia level 52.  INR 1.3.  Urinalysis with greater than 80 ketones, 30 protein otherwise unrevealing.  FOBT positive.  Ultrasound abdomen right upper quadrant with increased echogenicity and nodularity of the surface of the liver suggestive of cirrhosis, no focal abnormalities within the liver or gallbladder noted, no dilation of bile ducts.  GI was consulted.  Patient was placed on Protonix and octreotide drip.  TRH consulted for further evaluation management of acute GI bleed.  Assessment & Plan:   Upper GI bleed Hx esophageal variceal bleeding Patient presenting to the ED with hematemesis associated with dark stools.  Denies any current alcohol abuse, no NSAIDs or antiplatelet/anticoagulant use.  Hemoglobin 15.2 on arrival. --[facility] following; appreciate assistance --Hgb 15.2>>13.5 --Octreotide drip --Protonix drip --Ceftriaxone 2 g IV every 24 hours --Clear liquid diet, n.p.o. after midnight --EGD planned tomorrow --H/H q8h, CBC in am --Transfuse for hemoglobin less than 7.0  Elevated ammonia level No signs of encephalopathy. -- Lactulose 20g PO BID, goal 2-3 BMs  daily  Hyponatremia: Resolved Na 132 on admission, improved to 138 with IV fluid hydration, likely hypovolemic hyponatremia. --NS at 21mL/h -- CMP in a.m.  EtOH cirrhosis Ultrasound right upper quadrant unrevealing other than known cirrhosis, no bile duct dilation or abnormalities within the gallbladder.  INR 1.3.  Meld-Na = 14.  --Nadolol 20 mg p.o. daily  Anxiety/depression:  --Sertraline 100 mg p.o. daily -- Hydroxyzine 25 mg p.o. every 8 hours as needed anxiety  GERD: On PPI drip as above  DVT prophylaxis: SCDs Start: 12/30/21 1504    Code Status: Full Code Family Communication: No family present at bedside this morning  Disposition Plan:  Level of care: Progressive Status is: Inpatient Remains inpatient appropriate because: Pending EGD tomorrow    Consultants:  [facility]  Procedures:  None  Antimicrobials:  Ceftriaxone 9/4>>   Subjective: Patient seen and examined at bedside, resting comfortably.  No further hematemesis.  Has not had a bowel movement since arriving to the hospital.  Seen by GI with plan EGD tomorrow.  No other specific complaints or concerns at this time.  Denies headache, no dizziness, no chest pain, no palpitations, no shortness of breath, no abdominal pain, no focal weakness, no cough/congestion, no fatigue, no fever/chills/night sweats, no nausea/vomiting/diarrhea.  No acute events overnight per nursing staff.  Objective: Vitals:   12/30/21 2203 12/31/21 0203 12/31/21 0617 12/31/21 1049  BP: 120/73 112/70 112/66 125/82  Pulse: 81 72 71 89  Resp: (!) 21 19 (!) 21 14  Temp: 98.3 F (36.8 C)  98.4 F (36.9 C) 98.3 F (36.8 C)  TempSrc: Oral  Oral Oral  SpO2: 98% 98% 97% 99%  Weight:      Height:        Intake/Output Summary (Last 24 hours) at 12/31/2021 1214 Last data filed at 12/31/2021 0600 Gross per 24 hour  Intake 1832.89 ml  Output 0 ml  Net 1832.89 ml   Filed Weights   12/30/21 1412  Weight: 60.1 kg     Examination:  Physical Exam: GEN: NAD, alert and oriented x 3, wd/wn HEENT: NCAT, PERRL, EOMI, sclera clear, MMM PULM: CTAB w/o wheezes/crackles, normal respiratory effort, on room air CV: RRR w/o M/G/R GI: abd soft, NTND, NABS, no R/G/M MSK: no peripheral edema, muscle strength globally intact 5/5 bilateral upper/lower extremities NEURO: CN II-XII intact, no focal deficits, sensation to light touch intact PSYCH: normal mood/affect Integumentary: dry/intact, no rashes or wounds    Data Reviewed: I have personally reviewed following labs and imaging studies  CBC: Recent Labs  Lab 12/30/21 1033 12/30/21 1759 12/31/21 0015  WBC 10.1  --  6.9  HGB 15.2 14.0 13.5  HCT 42.6 40.3 38.3*  MCV 92.2  --  96.0  PLT 185  --  [id number]*   Basic Metabolic Panel: Recent Labs  Lab 12/30/21 1033 12/31/21 0015  NA 132* 138  K 3.7 4.2  CL 88* 97*  CO2 22 28  GLUCOSE 102* 108*  BUN 14 15  CREATININE 0.65 0.85  CALCIUM 9.7 8.8*  MG 2.0  --    GFR: Estimated Creatinine Clearance: 96.2 mL/min (by C-G formula based on SCr of 0.85 mg/dL). Liver Function Tests: Recent Labs  Lab 12/30/21 1033 12/31/21 0015  AST 91* 87*  ALT 54* 54*  ALKPHOS 50 45  BILITOT 2.5* 3.1*  PROT 8.3* 7.3  ALBUMIN 4.6 3.8   Recent Labs  Lab 12/30/21 1033  LIPASE 29   Recent Labs  Lab 12/30/21 1033  AMMONIA 52*   Coagulation Profile: Recent Labs  Lab 12/30/21 1033  INR 1.3*   Cardiac Enzymes: No results for input(s): "CKTOTAL", "CKMB", "CKMBINDEX", "TROPONINI" in the last 168 hours. BNP (last 3 results) No results for input(s): "PROBNP" in the last 8760 hours. HbA1C: No results for input(s): "HGBA1C" in the last 72 hours. CBG: No results for input(s): "GLUCAP" in the last 168 hours. Lipid Profile: No results for input(s): "CHOL", "HDL", "LDLCALC", "TRIG", "CHOLHDL", "LDLDIRECT" in the last 72 hours. Thyroid Function Tests: No results for input(s): "TSH", "T4TOTAL", "FREET4", "T3FREE",  "THYROIDAB" in the last 72 hours. Anemia Panel: No results for input(s): "VITAMINB12", "FOLATE", "FERRITIN", "TIBC", "IRON", "RETICCTPCT" in the last 72 hours. Sepsis Labs: No results for input(s): "PROCALCITON", "LATICACIDVEN" in the last 168 hours.  No results found for this or any previous visit (from the past 240 hour(s)).       Radiology Studies: US Abdomen Limited RUQ (LIVER/GB)  Result Date: 12/30/2021 CLINICAL DATA:  Abnormal liver function tests EXAM: ULTRASOUND ABDOMEN LIMITED RIGHT UPPER QUADRANT COMPARISON:  08/19/2021 FINDINGS: Gallbladder: No gallstones or wall thickening visualized. No sonographic Murphy sign noted by sonographer. Common bile duct: Diameter: 2 mm Liver: There is inhomogeneous increased echogenicity. There is nodularity in liver surface. No focal abnormalities are seen in visualized portions of liver. Portal vein is patent on color Doppler imaging with normal direction of blood flow towards the liver. Other: None. IMPRESSION: Increased echogenicity and nodularity in the surface in the liver suggest cirrhosis. No focal abnormalities are seen in the visualized portions of liver. No sonographic abnormalities are seen in gallbladder. There is no dilation of bile ducts. Electronically Signed   By:  Ernie Avena M.D.   On: 12/30/2021 16:23        Scheduled Meds:  lactulose  20 g Oral BID   [START ON 01/01/2022] metoCLOPramide (REGLAN) injection  10 mg Intravenous Once   Continuous Infusions:  sodium chloride 75 mL/hr at 12/31/21 0449   sodium chloride     sodium chloride     cefTRIAXone (ROCEPHIN)  IV 2 g (12/31/21 1127)   octreotide (SANDOSTATIN) 500 mcg in sodium chloride 0.9 % 250 mL (2 mcg/mL) infusion 50 mcg/hr (12/31/21 3295)   pantoprazole 8 mg/hr (12/31/21 0611)     LOS: 1 day    Time spent: 50 minutes spent on chart review, discussion with nursing staff, consultants, updating family and interview/physical exam; more than 50% of that time was  spent in counseling and/or coordination of care.    Alvira Philips [place], DO [facility] Available via Epic secure chat 7am-7pm After these hours, please refer to coverage provider listed on [email or web address] 12/31/2021, 12:14 PM

## 2021-12-31 NOTE — Consult Note (Addendum)
Consultation  Referring Provider:   [facility] Primary Care Physician:  Rema Fendt, NP Primary Gastroenterologist:  Dr. Adela Lank       Reason for Consultation:     Hematemesis in setting of alcoholic cirrhosis with history of variceal bleeding   Impression and Plan:   Decompensated cirrhosis secondary to ETOH WBC 6.9 HGB 13.5 MCV 96.0 Platelets 122 AST 87 ALT 54  Alkphos 45 TBili 3.1 GFR >60  INR 1.3 MELD 3.0: 14 at 12/31/2021 12:15 AM MELD-Na: 14 at 12/31/2021 12:15 AM Calculated from: Serum Creatinine: 0.85 mg/dL (Using min of 1 mg/dL) at 0/05/7739 28:78 AM Serum Sodium: 138 mmol/L (Using max of 137 mmol/L) at 12/31/2021 12:15 AM Total Bilirubin: 3.1 mg/dL at 10/03/6718 94:70 AM Serum Albumin: 3.8 g/dL (Using max of 3.5 g/dL) at 01/02/2835 62:94 AM INR(ratio): 1.3 at 12/30/2021 10:33 AM Age at listing (hypothetical): 41 years Sex: Male at 12/31/2021 12:15 AM  -Daily CBC, CMET, INR -Alcohol Abstinence counseling discussed with patient as continued use is strongly associated with worsening liver disease progression  - per patient a year ago was last ETOH but also states had some "by mistake" a few days ago [facility] SCREEN: 12/30/2021 showed cirrhosis with no focal abnormalities, normal gallbladder and no ductal dilatation.  Esophageal Varices with suspected bleed Last EGD 07/2021 with Dr. Adela Lank that showed grade 1 varices however prior January 2023 and December 2022 endoscopies had 7 and 6 varices banded -IV Protonix  Drip -IV Octreotide Drip @50  mcg/hr -IV Ceftriaxone if varices noted then will continue treatment for at least 5 days -Will plan for tentative EGD tomorrow due to restrictions from anesthesia -Clear liquid diet today, n.p.o. at midnight -Would give 10 mg IV Reglan 30 minutes prior to endoscopy I discussed risks of EGD with patient today, including risk of sedation, bleeding or perforation. Patient provides understanding and gave verbal consent to proceed.  -Trend  Hgb/Hct  -Continue volume resuscitation with Hgb goal > 7 (higher Hgb rates have been associated with worsened mortality in the setting of portal hypertensive variceal bleeds); unless active bleeding is occuring -INR Goal <2.0 (may proceed with FFP if necessary and give IV Vitamin K 10 to optimize prior to procedure) -Monitor I/Os closely  without Coagulopathy 12/30/2021 INR 1.3 Check PT/INR daily (Vitamin K/FFP if elevated for procedure)  Hepatic Encephalopathy:  12/30/2021 Ammonia 52 -There is no evidence of encephalopathy - Lactulose 72ml BID titrate to 3bms per day- will add on to med list- has not had BM in 2-3 days.  -Patient advised to not drive due to impaired reflexes with history of HE  Ascites No evidence of ascites on exam.   Thank you for your kind consultation, we will continue to follow.   Attending Physician Note   I have taken a history, reviewed the chart and examined the patient. I performed more than 50% of this encounter in conjunction with the APP. I agree with the APP's note, impression and recommendations with my edits. My additional impressions and recommendations are as follows.   Hematemesis. Esophageal varices previously banded. R/O varices, portal gastropathy, MW tear, ulcer, other source. IV Protonix infusion, IV octreotide infusion, IV Ceftriaxone. Monitor CBC. EGD tomorrow.  Decompensated etoh cirrhosis with varices, HE. MELD=14. Continue lactulose. Treated with nadolol as outpatient. Avoid all alcohol.   30m, MD [facility] See [facility] RAFAEL LOPEZ NUSSA, [facility], for our on call provider            HPI:   James Gautreau  Rudene Best is a 41 y.o. male with past medical history significant for alcoholic cirrhosis, history of variceal bleeding, history of hepatic encephalopathy presents with hematemesis.  Patient was last seen in the office 03/12/2021 with Dr. Adela LankArmbruster.  Patient had longstanding history of esophageal varices, first variceal bleed 2017.  Has had multiple  admissions for variceal bleeding and EGDs since that time.  Last admission for variceal bleeding 2021 requiring blood transfusions, last endoscopy was 07/2021 with Dr. Adela LankArmbruster that showed grade 1 varices however prior January 2023 and December 2022 endoscopies had 7 and 6 varices banded Patient is being considered for TIPS in the past but never had this done. Patient's been on nadolol 20 mg daily. She is on lactulose daily for hepatic encephalopathy. Patient is also [facility] however MELD score has not been enough to consider as candidate.  Patient states she reported 2 AM yesterday morning onset of sudden hematemesis 3 times with 1 episode black stools. Patient denies any nausea prior to this, did have some dizziness with it but denies chest pain, shortness of breath, fever, chills.  Denies swelling in abdomen or lower legs. Patient denies jaundice. Denies NSAIDs. Has not had any alcohol in at least a year.  In prior ER note states had alcohol 2 days ago when questioned, patient states he thought it was a beverage without alcohol but drank it and it was but never drink any further. Denies any abdominal pain. Has been compliant with taking nadolol 20 mg and lactulose 20 mils twice daily.  Denies any confusion.  In the ER patient had AST 91, ALT 54, total bilirubin 2.5, sodium 132, INR 1.3, ammonia 52, fecal occult positive patient was hemodynamically stable, started on  octreotide, pantoprazole, rocephin.  Patient's initial hemoglobin 15.2 currently at 13.5, platelets 122 No elevation of BUN, creatinine 0.85.  08/01/2021 endoscopy with Dr. Adela LankArmbruster showed grade 1 disease that flattened with insufflation, normal stomach and duodenum.  Did show portal hypertensive gastropathy 05/21/2021 Endoscopy with Dr. Christella HartiganJacobs medium size esophageal varices distal mid esophagus 7 ligating bands complete eradication mild portal gastropathy 04/16/2021 endoscopy with Dr. Leonides Schanzorsey 6 ligating  bands completely eradicated  Abnormal ED labs: Abnormal Labs Reviewed  COMPREHENSIVE METABOLIC PANEL - Abnormal; Notable for the following components:      Result Value   Sodium 132 (*)    Chloride 88 (*)    Glucose, Bld 102 (*)    Total Protein 8.3 (*)    AST 91 (*)    ALT 54 (*)    Total Bilirubin 2.5 (*)    Anion gap 22 (*)    All other components within normal limits  URINALYSIS, ROUTINE W REFLEX MICROSCOPIC - Abnormal; Notable for the following components:   Specific Gravity, Urine 1.034 (*)    Ketones, ur >80 (*)    Protein, ur 30 (*)    All other components within normal limits  PROTIME-INR - Abnormal; Notable for the following components:   Prothrombin Time 16.3 (*)    INR 1.3 (*)    All other components within normal limits  AMMONIA - Abnormal; Notable for the following components:   Ammonia 52 (*)    All other components within normal limits  OCCULT BLOOD X 1 CARD TO LAB, STOOL - Abnormal; Notable for the following components:   Fecal Occult Bld POSITIVE (*)    All other components within normal limits  COMPREHENSIVE METABOLIC PANEL - Abnormal; Notable for the following components:   Chloride 97 (*)  Glucose, Bld 108 (*)    Calcium 8.8 (*)    AST 87 (*)    ALT 54 (*)    Total Bilirubin 3.1 (*)    All other components within normal limits  CBC - Abnormal; Notable for the following components:   RBC 3.99 (*)    HCT 38.3 (*)    Platelets 122 (*)    All other components within normal limits     Past Medical History:  Diagnosis Date   Anxiety    Phreesia 05/14/2020   Blood transfusion without reported diagnosis    Phreesia 05/14/2020   Cirrhosis of liver (HCC)    Depression    Phreesia 05/14/2020   Esophageal varices with hemorrhage (HCC)    ETOH abuse    GERD (gastroesophageal reflux disease)    Seizures (HCC)    Phreesia 05/14/2020   Substance abuse (HCC)    Phreesia 05/14/2020    Surgical History:  He  has a past surgical history that  includes Esophagogastroduodenoscopy (Left, 08/21/2015); Esophagogastroduodenoscopy (egd) with propofol (N/A, 08/30/2019); esophageal banding (08/30/2019); Esophagogastroduodenoscopy (egd) with propofol (N/A, 10/10/2019); esophageal banding (10/10/2019); Esophagogastroduodenoscopy (N/A, 12/05/2019); Hot hemostasis (N/A, 12/05/2019); gastric varices banding (12/05/2019); IR Radiologist Eval & Mgmt (12/13/2019); Esophagogastroduodenoscopy (egd) with propofol (N/A, 12/23/2019); esophageal banding (N/A, 12/23/2019); Esophagogastroduodenoscopy (egd) with propofol (N/A, 01/12/2020); esophageal banding (N/A, 01/12/2020); Esophagogastroduodenoscopy (egd) with propofol (N/A, 09/10/2020); esophageal banding (N/A, 09/10/2020); Esophagogastroduodenoscopy (egd) with propofol (N/A, 04/16/2021); esophageal banding (04/16/2021); Esophagogastroduodenoscopy (egd) with propofol (N/A, 05/21/2021); esophageal banding (N/A, 05/21/2021); and Esophagogastroduodenoscopy (egd) with propofol (N/A, 08/01/2021). Family History:  His family history includes Crohn's disease in his maternal uncle; Heart attack in his maternal grandfather and maternal uncle; Peptic Ulcer in his paternal aunt; Stroke in his maternal grandmother and paternal grandfather. Social History:   reports that he has quit smoking. His smoking use included cigarettes. He smoked an average of 1 pack per day. He has never used smokeless tobacco. He reports that he does not currently use alcohol after a past usage of about 3.0 standard drinks of alcohol per week. He reports current drug use. Frequency: 7.00 times per week. Drug: Marijuana.  Prior to Admission medications   Medication Sig Start Date End Date Taking? Authorizing Provider  Ferrous Sulfate (IRON) 325 (65 Fe) MG TABS Take 1 tablet (325 mg total) by mouth daily. Patient taking differently: Take 325 mg by mouth daily with breakfast. 09/02/19  Yes Regalado, Belkys A, MD  folic acid (FOLVITE) 400 MCG tablet Take 400 mcg by mouth daily.    Yes [provider]  hydrocortisone cream 1 % Apply 1 application topically daily as needed for itching.   Yes [provider]  hydrOXYzine (VISTARIL) 25 MG capsule Take 1 capsule (25 mg total) by mouth every 8 (eight) hours as needed. Patient taking differently: Take 25 mg by mouth every 8 (eight) hours as needed for anxiety. 12/24/21  Yes Zonia Kief, Amy J, NP  lactulose (CHRONULAC) 10 GM/15ML solution Take 22.5 mLs (15 g total) by mouth in the morning and at bedtime. 12/04/21  Yes Armbruster, Willaim Rayas, MD  Magnesium 500 MG TABS Take 500 mg by mouth in the morning and at bedtime.   Yes [provider]  Multiple Vitamin (MULTIVITAMIN WITH MINERALS) TABS tablet Take 1 tablet by mouth daily. Patient taking differently: Take 1 tablet by mouth daily with breakfast. 07/19/19  Yes Johnson, Clanford L, MD  nadolol (CORGARD) 20 MG tablet TAKE 1 TABLET (20 MG TOTAL) BY MOUTH DAILY. Patient taking  differently: Take 20 mg by mouth daily. 03/12/21  Yes Armbruster, Willaim Rayas, MD  oxymetazoline (AFRIN) 0.05 % nasal spray Place 1 spray into both nostrils 2 (two) times daily as needed for congestion.   Yes [provider]  pantoprazole (PROTONIX) 40 MG tablet Take 1 tablet (40 mg total) by mouth 2 (two) times daily before a meal. 09/17/21  Yes Armbruster, Willaim Rayas, MD  sertraline (ZOLOFT) 100 MG tablet Take 1 tablet (100 mg total) by mouth daily. 12/24/21  Yes Zonia Kief, Amy J, NP  thiamine (VITAMIN B-1) 100 MG tablet Take 100 mg by mouth daily.   Yes [provider]    Current Facility-Administered Medications  Medication Dose Route Frequency Provider Last Rate Last Admin   0.9 %  sodium chloride infusion   Intravenous Continuous Ronaldo Miyamoto, Tyrone A, DO 75 mL/hr at 12/31/21 0449 New Bag at 12/31/21 0449   0.9 %  sodium chloride infusion   Intravenous Continuous Armbruster, Willaim Rayas, MD       cefTRIAXone (ROCEPHIN) 2 g in sodium chloride 0.9 % 100 mL IVPB  2 g Intravenous Q24H  Ronaldo Miyamoto, Tyrone A, DO       octreotide (SANDOSTATIN) 500 mcg in sodium chloride 0.9 % 250 mL (2 mcg/mL) infusion  50 mcg/hr Intravenous Continuous Ronaldo Miyamoto, Tyrone A, DO 25 mL/hr at 12/31/21 0613 50 mcg/hr at 12/31/21 6803   Oral care mouth rinse  15 mL Mouth Rinse PRN Ronaldo Miyamoto, Tyrone A, DO       pantoprozole (PROTONIX) 80 mg /NS 100 mL infusion  8 mg/hr Intravenous Continuous Ronaldo Miyamoto, Tyrone A, DO 10 mL/hr at 12/31/21 2122 8 mg/hr at 12/31/21 4825   prochlorperazine (COMPAZINE) injection 10 mg  10 mg Intravenous Q6H PRN Margie Ege A, DO        Allergies as of 12/30/2021 - Review Complete 12/30/2021  Allergen Reaction Noted   Penicillins Hives and Other (See Comments) 05/26/2018    Review of Systems:    Constitutional: No weight loss, fever, chills, weakness or fatigue HEENT: Eyes: No change in vision               Ears, Nose, Throat:  No change in hearing or congestion Skin: No rash or itching Cardiovascular: No chest pain, chest pressure or palpitations   Respiratory: No SOB or cough Gastrointestinal: See HPI and otherwise negative Genitourinary: No dysuria or change in urinary frequency Neurological: No headache, dizziness or syncope Musculoskeletal: No new muscle or joint pain Hematologic: No bleeding or bruising Psychiatric: No history of depression or anxiety     Physical Exam:  Vital signs in last 24 hours: Temp:  [98.2 F (36.8 C)-98.6 F (37 C)] 98.4 F (36.9 C) (09/05 0617) Pulse Rate:  [71-93] 71 (09/05 0617) Resp:  [14-21] 21 (09/05 0617) BP: (112-136)/(66-86) 112/66 (09/05 0617) SpO2:  [94 %-100 %] 97 % (09/05 0617) Weight:  [60.1 kg] 60.1 kg (09/04 1412) Last BM Date : 12/30/21 Last BM recorded by nurses in past 5 days No data recorded  General :  Alert, well developed male in no acute distress Head:  Normocephalic and atraumatic. Eyes :  sclerae anicteric,conjunctive pink  Heart:  regular rate and rhythm Pulm:  Clear anteriorly; no wheezing Abdomen:   Soft, non  distended AB, skin exam normal, Normal bowel sounds.  no  tenderness, without organomegaly. no  fluid wave, no  shifting dullness.  Extremities:   Without edema. Msk:  Symmetrical without gross deformities. Peripheral pulses intact.  Neurologic: Alert and  oriented x4;  grossly normal neurologically. without asterixis or clonus.  Skin:   without jaundice. no palmar erythema or spider angioma.   Psychiatric:  Demonstrates good judgement and reason without abnormal affect or behaviors.  LAB RESULTS: Recent Labs    12/30/21 1033 12/30/21 1759 12/31/21 0015  WBC 10.1  --  6.9  HGB 15.2 14.0 13.5  HCT 42.6 40.3 38.3*  PLT 185  --  122*   BMET Recent Labs    12/30/21 1033 12/31/21 0015  NA 132* 138  K 3.7 4.2  CL 88* 97*  CO2 22 28  GLUCOSE 102* 108*  BUN 14 15  CREATININE 0.65 0.85  CALCIUM 9.7 8.8*   LFT Recent Labs    12/31/21 0015  PROT 7.3  ALBUMIN 3.8  AST 87*  ALT 54*  ALKPHOS 45  BILITOT 3.1*   PT/INR Recent Labs    12/30/21 1033  LABPROT 16.3*  INR 1.3*    STUDIES: US Abdomen Limited RUQ (LIVER/GB)  Result Date: 12/30/2021 CLINICAL DATA:  Abnormal liver function tests EXAM: ULTRASOUND ABDOMEN LIMITED RIGHT UPPER QUADRANT COMPARISON:  08/19/2021 FINDINGS: Gallbladder: No gallstones or wall thickening visualized. No sonographic Murphy sign noted by sonographer. Common bile duct: Diameter: 2 mm Liver: There is inhomogeneous increased echogenicity. There is nodularity in liver surface. No focal abnormalities are seen in visualized portions of liver. Portal vein is patent on color Doppler imaging with normal direction of blood flow towards the liver. Other: None. IMPRESSION: Increased echogenicity and nodularity in the surface in the liver suggest cirrhosis. No focal abnormalities are seen in the visualized portions of liver. No sonographic abnormalities are seen in gallbladder. There is no dilation of bile ducts. Electronically Signed   By: Ernie Avena M.D.    On: 12/30/2021 16:23     Doree Albee  12/31/2021, 8:17 AM

## 2021-12-31 NOTE — Plan of Care (Signed)
  Problem: Health Behavior/Discharge Planning: Goal: Ability to manage health-related needs will improve Outcome: Progressing   Problem: Clinical Measurements: Goal: Ability to maintain clinical measurements within normal limits will improve Outcome: Progressing Goal: Will remain free from infection Outcome: Progressing   Problem: Nutrition: Goal: Adequate nutrition will be maintained Outcome: Progressing   Problem: Coping: Goal: Level of anxiety will decrease Outcome: Progressing   

## 2022-01-01 ENCOUNTER — Inpatient Hospital Stay (HOSPITAL_COMMUNITY): Payer: Medicare Other | Admitting: Anesthesiology

## 2022-01-01 ENCOUNTER — Encounter (HOSPITAL_COMMUNITY)
Admission: EM | Disposition: A | Discharge status: Home / Self Care | Payer: Self-pay | Source: Home / Self Care | Attending: Internal Medicine

## 2022-01-01 ENCOUNTER — Encounter (HOSPITAL_COMMUNITY): Payer: Self-pay | Admitting: Internal Medicine

## 2022-01-01 ENCOUNTER — Telehealth: Payer: Self-pay

## 2022-01-01 DIAGNOSIS — K3189 Other diseases of stomach and duodenum: Secondary | ICD-10-CM | POA: Diagnosis not present

## 2022-01-01 DIAGNOSIS — F418 Other specified anxiety disorders: Secondary | ICD-10-CM

## 2022-01-01 DIAGNOSIS — K766 Portal hypertension: Secondary | ICD-10-CM | POA: Diagnosis not present

## 2022-01-01 DIAGNOSIS — I8510 Secondary esophageal varices without bleeding: Secondary | ICD-10-CM | POA: Diagnosis not present

## 2022-01-01 DIAGNOSIS — I8511 Secondary esophageal varices with bleeding: Secondary | ICD-10-CM

## 2022-01-01 DIAGNOSIS — K922 Gastrointestinal hemorrhage, unspecified: Secondary | ICD-10-CM | POA: Diagnosis not present

## 2022-01-01 HISTORY — PX: ESOPHAGOGASTRODUODENOSCOPY (EGD) WITH PROPOFOL: SHX5813

## 2022-01-01 LAB — COMPREHENSIVE METABOLIC PANEL
ALT: 51 U/L — ABNORMAL HIGH (ref 0–44)
AST: 71 U/L — ABNORMAL HIGH (ref 15–41)
Albumin: 3.4 g/dL — ABNORMAL LOW (ref 3.5–5.0)
Alkaline Phosphatase: 38 U/L (ref 38–126)
Anion gap: 7 (ref 5–15)
BUN: 9 mg/dL (ref 6–20)
CO2: 27 mmol/L (ref 22–32)
Calcium: 8.7 mg/dL — ABNORMAL LOW (ref 8.9–10.3)
Chloride: 107 mmol/L (ref 98–111)
Creatinine, Ser: 0.87 mg/dL (ref 0.61–1.24)
GFR, Estimated: >60 mL/min (ref >60)
Glucose, Bld: 100 mg/dL — ABNORMAL HIGH (ref 70–99)
Potassium: 4.1 mmol/L (ref 3.5–5.1)
Sodium: 141 mmol/L (ref 135–145)
Total Bilirubin: 2 mg/dL — ABNORMAL HIGH (ref 0.3–1.2)
Total Protein: 6.5 g/dL (ref 6.5–8.1)

## 2022-01-01 LAB — CBC
HCT: 39.9 % (ref 39.0–52.0)
Hemoglobin: 13.4 g/dL (ref 13.0–17.0)
MCH: 32.8 pg (ref 26.0–34.0)
MCHC: 33.6 g/dL (ref 30.0–36.0)
MCV: 97.8 fL (ref 80.0–100.0)
Platelets: 104 10*3/uL — ABNORMAL LOW (ref 150–400)
RBC: 4.08 MIL/uL — ABNORMAL LOW (ref 4.22–5.81)
RDW: 12.6 % (ref 11.5–15.5)
WBC: 5.7 10*3/uL (ref 4.0–10.5)
nRBC: 0 % (ref 0.0–0.2)

## 2022-01-01 LAB — MAGNESIUM: Magnesium: 1.8 mg/dL (ref 1.7–2.4)

## 2022-01-01 SURGERY — ESOPHAGOGASTRODUODENOSCOPY (EGD) WITH PROPOFOL
Anesthesia: Monitor Anesthesia Care

## 2022-01-01 MED ORDER — LIDOCAINE HCL (CARDIAC) PF 100 MG/5ML IV SOSY
PREFILLED_SYRINGE | INTRAVENOUS | Status: DC | PRN
  Administered 2022-01-01: 100 mg via INTRAVENOUS

## 2022-01-01 MED ORDER — PROPOFOL 10 MG/ML IV BOLUS
INTRAVENOUS | Status: DC | PRN
  Administered 2022-01-01: 50 mg via INTRAVENOUS

## 2022-01-01 MED ORDER — PROPOFOL 500 MG/50ML IV EMUL
INTRAVENOUS | Status: DC | PRN
  Administered 2022-01-01: 175 ug/kg/min via INTRAVENOUS

## 2022-01-01 MED ORDER — PANTOPRAZOLE SODIUM 40 MG PO TBEC
40.0000 mg | DELAYED_RELEASE_TABLET | Freq: Every day | ORAL | Status: DC
  Administered 2022-01-01: 40 mg via ORAL
  Filled 2022-01-01: qty 1

## 2022-01-01 MED ORDER — LACTATED RINGERS IV SOLN: INTRAVENOUS | Status: DC | PRN

## 2022-01-01 SURGICAL SUPPLY — 15 items

## 2022-01-01 NOTE — Op Note (Signed)
[facility] Patient Name: [name] Procedure Date: 01/01/2022 MRN: [id number] Attending MD: Meryl Dare , MD Date of Birth: February 15, 1981 CSN: [id number] Age: 41 Admit Type: Inpatient Procedure:                Upper GI endoscopy Indications:              Hematemesis Providers:                Venita Lick. Russella Dar, MD, Lorenza Evangelist, RN, Salley Scarlet, Technician, Judeth Cornfield [place], CRNA Referring MD:             [facility] Medicines:                Monitored Anesthesia Care Complications:            No immediate complications. Estimated Blood Loss:     Estimated blood loss: none. Procedure:                Pre-Anesthesia Assessment:                           - Prior to the procedure, a History and Physical                            was performed, and patient medications and                            allergies were reviewed. The patient's tolerance of                            previous anesthesia was also reviewed. The risks                            and benefits of the procedure and the sedation                            options and risks were discussed with the patient.                            All questions were answered, and informed consent                            was obtained. Prior Anticoagulants: The patient has                            taken no previous anticoagulant or antiplatelet                            agents. ASA Grade Assessment: III - A patient with                            severe systemic disease. After reviewing the risks  and benefits, the patient was deemed in                            satisfactory condition to undergo the procedure.                           After obtaining informed consent, the endoscope was                            passed under direct vision. Throughout the                            procedure, the patient's blood pressure, pulse, and                             oxygen saturations were monitored continuously. The                            [id number]-[id number] ([id number]) Olympus endoscope was introduced                            through the mouth, and advanced to the second part                            of duodenum. The upper GI endoscopy was                            accomplished without difficulty. The patient                            tolerated the procedure well. Scope In: Scope Out: Findings:      Grade I varices were found in the distal esophagus, flattened with       insufflation. They were 4 mm in largest diameter.      The exam of the esophagus was otherwise normal.      Moderate portal hypertensive gastropathy was found in the gastric fundus       and in the gastric body.      The exam of the stomach was otherwise normal. No gastric varices.      The duodenal bulb and second portion of the duodenum were normal. Impression:               - Grade I esophageal varices.                           - Portal hypertensive gastropathy.                           - Normal duodenal bulb and second portion of the                            duodenum.                           - No specimens collected. Moderate Sedation:      Not Applicable - Patient  had care per Anesthesia. Recommendation:           - Return patient to hospital ward for ongoing care,                            possible discharge later today/tomorrow.                           - Resume regular diet.                           - Suspected bleeding from portal gastropathy.                           - Continue present medications, except discontinue                            octreotide.                           - Return to GI office at appointment to be                            scheduled with Dr. Adela Lank. Procedure Code(s):        --- Professional ---                           [phone], Esophagogastroduodenoscopy, flexible,                            transoral; diagnostic, including  collection of                            specimen(s) by brushing or washing, when performed                            (separate procedure) Diagnosis Code(s):        --- Professional ---                           I85.00, Esophageal varices without bleeding                           K76.6, Portal hypertension                           K31.89, Other diseases of stomach and duodenum                           K92.0, Hematemesis CPT copyright 2019 American Medical Association. All rights reserved. The codes documented in this report are preliminary and upon coder review may  be revised to meet current compliance requirements. Meryl Dare, MD 01/01/2022 1:08:46 PM This report has been signed electronically. Number of Addenda: 0

## 2022-01-01 NOTE — TOC Transition Note (Signed)
Transition of Care [facility]) - CM/SW Discharge Note   Patient Details  Name: [name]: [id number] Date of Birth: 08-Oct-1980  [facility] CM/SW Contact:  Lavenia Atlas, RN Phone Number: 01/01/2022, 2:07 PM   Clinical Narrative:   Patient lives with his parents, has no needs for home services. Prior to admission DME: walker and bp cuff.  Pharmacy: [facility] on [address]   No TOC needs at this time.    Final next level of care: Home/Self Care Barriers to Discharge: No Barriers Identified   Patient Goals and CMS Choice Patient states their goals for this hospitalization and ongoing recovery are:: return home   Choice offered to / list presented to : NA  Discharge Placement                       Discharge Plan and Services In-house Referral: NA Discharge Planning Services: CM Consult Post Acute Care Choice: NA          DME Arranged: N/A DME Agency: NA         HH Agency: NA        Social Determinants of Health (SDOH) Interventions     Readmission Risk Interventions     No data to display

## 2022-01-01 NOTE — Progress Notes (Signed)
Pt off the unit to Endo at this time. Stable at time of departure.

## 2022-01-01 NOTE — Telephone Encounter (Signed)
Patient has been scheduled for a 5-week follow up with Quentin Mulling, PA-C on Wednesday, 10/11//23 at 9:15 am. Appt information sent to patient via MyChart and mailed. Appt information will also be added to hospital discharge summary.

## 2022-01-01 NOTE — Anesthesia Postprocedure Evaluation (Signed)
Anesthesia Post Note  Patient: [name] [facility]  Procedure(s) Performed: ESOPHAGOGASTRODUODENOSCOPY (EGD) WITH PROPOFOL     Patient location during evaluation: PACU Anesthesia Type: MAC Level of consciousness: awake and alert Pain management: pain level controlled Vital Signs Assessment: post-procedure vital signs reviewed and stable Respiratory status: spontaneous breathing, nonlabored ventilation and respiratory function stable Cardiovascular status: blood pressure returned to baseline Postop Assessment: no apparent nausea or vomiting Anesthetic complications: no   No notable events documented.  Last Vitals:  Vitals:   01/01/22 1320 01/01/22 1323  BP: 110/70   Pulse: (!) 56 63  Resp: 19 18  Temp:    SpO2: 100% 100%    Last Pain:  Vitals:   01/01/22 1323  TempSrc:   PainSc: 0-No pain                 Marthenia Rolling

## 2022-01-01 NOTE — Progress Notes (Signed)
PROGRESS NOTE    [name]  YQM:[id number] DOB: 07/05/1980 DOA: 12/30/2021 PCP: Rema Fendt, NP    Brief Narrative:   [name] is a 41 y.o. male with past medical history significant for EtOH cirrhosis, history of esophageal varices, history of hepatic encephalopathy, anxiety/depression, GERD who presented to [facility] ED on 9/4 with hematemesis and dark stools.  Patient also reports associated dizziness.  Denies current alcohol abuse.  Given recurrence of hematemesis he came to the ED for further evaluation.  Does not take antiplatelets/blood thinners.  TRH consulted for further evaluation management of acute GI bleed.  EGD planned for 9/6.  Assessment & Plan:   Upper GI bleed Hx esophageal variceal bleeding Patient presenting to the ED with hematemesis associated with dark stools.  Denies any current alcohol abuse, no NSAIDs or antiplatelet/anticoagulant use.  Hemoglobin 15.2 on arrival. --[facility] following; appreciate assistance --Octreotide drip --Protonix drip --Ceftriaxone 2 g IV every 24 hours --EGD planned  --Transfuse for hemoglobin less than 7.0  Elevated ammonia level No signs of encephalopathy. -- Lactulose 20g PO BID, goal 2-3 BMs daily  Hyponatremia: Resolved Na 132 on admission, improved to 138 with IV fluid hydration, likely hypovolemic hyponatremia.  EtOH cirrhosis Ultrasound right upper quadrant unrevealing other than known cirrhosis, no bile duct dilation or abnormalities within the gallbladder.  INR 1.3.  Meld-Na = 14.  --Nadolol 20 mg p.o. daily  Anxiety/depression:  --Sertraline 100 mg p.o. daily -- Hydroxyzine 25 mg p.o. every 8 hours as needed anxiety  GERD: On PPI drip as above  DVT prophylaxis: SCDs Start: 12/30/21 1504    Code Status: Full Code Family Communication: No family present at bedside this morning  Disposition Plan:  Level of care: Progressive Status is: Inpatient Remains inpatient appropriate because: Pending EGD  tomorrow    Consultants:  [facility]    Subjective: No complaints  Objective: Vitals:   12/31/21 1049 12/31/21 1406 12/31/21 2037 01/01/22 0428  BP: 125/82 121/76 116/75 107/62  Pulse: 89 79 61 (!) 57  Resp: 14 16 18 16   Temp: 98.3 F (36.8 C) 98.6 F (37 C) 98.5 F (36.9 C) 97.6 F (36.4 C)  TempSrc: Oral Oral Oral Oral  SpO2: 99% 99% 99% 98%  Weight:      Height:        Intake/Output Summary (Last 24 hours) at 01/01/2022 1140 Last data filed at 01/01/2022 0600 Gross per 24 hour  Intake 3324.78 ml  Output --  Net 3324.78 ml   Filed Weights   12/30/21 1412  Weight: 60.1 kg    Examination:  Physical Exam:  General: Appearance:    Well developed, well nourished male in no acute distress     Lungs:     Clear to auscultation bilaterally, respirations unlabored  Heart:    Bradycardic. Normal rhythm. No murmurs, rubs, or gallops.   MS:   All extremities are intact.   Neurologic:   Awake, alert, oriented x 3. No apparent focal neurological           defect.        Data Reviewed: I have personally reviewed following labs and imaging studies  CBC: Recent Labs  Lab 12/30/21 1033 12/30/21 1759 12/31/21 0015 12/31/21 1341 12/31/21 2213 01/01/22 0423  WBC 10.1  --  6.9  --   --  5.7  HGB 15.2 14.0 13.5 12.0* 13.4 13.4  HCT 42.6 40.3 38.3* 35.3* 39.3 39.9  MCV 92.2  --  96.0  --   --  97.8  PLT 185  --  122*  --   --  104*   Basic Metabolic Panel: Recent Labs  Lab 12/30/21 1033 12/31/21 0015 01/01/22 0423  NA 132* 138 141  K 3.7 4.2 4.1  CL 88* 97* 107  CO2 22 28 27   GLUCOSE 102* 108* 100*  BUN 14 15 9   CREATININE 0.65 0.85 0.87  CALCIUM 9.7 8.8* 8.7*  MG 2.0  --  1.8   GFR: Estimated Creatinine Clearance: 94 mL/min (by C-G formula based on SCr of 0.87 mg/dL). Liver Function Tests: Recent Labs  Lab 12/30/21 1033 12/31/21 0015 01/01/22 0423  AST 91* 87* 71*  ALT 54* 54* 51*  ALKPHOS 50 45 38  BILITOT 2.5* 3.1* 2.0*  PROT 8.3* 7.3 6.5   ALBUMIN 4.6 3.8 3.4*   Recent Labs  Lab 12/30/21 1033  LIPASE 29   Recent Labs  Lab 12/30/21 1033  AMMONIA 52*   Coagulation Profile: Recent Labs  Lab 12/30/21 1033  INR 1.3*   Cardiac Enzymes: No results for input(s): "CKTOTAL", "CKMB", "CKMBINDEX", "TROPONINI" in the last 168 hours. BNP (last 3 results) No results for input(s): "PROBNP" in the last 8760 hours. HbA1C: No results for input(s): "HGBA1C" in the last 72 hours. CBG: No results for input(s): "GLUCAP" in the last 168 hours. Lipid Profile: No results for input(s): "CHOL", "HDL", "LDLCALC", "TRIG", "CHOLHDL", "LDLDIRECT" in the last 72 hours. Thyroid Function Tests: No results for input(s): "TSH", "T4TOTAL", "FREET4", "T3FREE", "THYROIDAB" in the last 72 hours. Anemia Panel: No results for input(s): "VITAMINB12", "FOLATE", "FERRITIN", "TIBC", "IRON", "RETICCTPCT" in the last 72 hours. Sepsis Labs: No results for input(s): "PROCALCITON", "LATICACIDVEN" in the last 168 hours.  No results found for this or any previous visit (from the past 240 hour(s)).       Radiology Studies: 03/01/22 Abdomen Limited RUQ (LIVER/GB)  Result Date: 12/30/2021 CLINICAL DATA:  Abnormal liver function tests EXAM: ULTRASOUND ABDOMEN LIMITED RIGHT UPPER QUADRANT COMPARISON:  08/19/2021 FINDINGS: Gallbladder: No gallstones or wall thickening visualized. No sonographic Murphy sign noted by sonographer. Common bile duct: Diameter: 2 mm Liver: There is inhomogeneous increased echogenicity. There is nodularity in liver surface. No focal abnormalities are seen in visualized portions of liver. Portal vein is patent on color Doppler imaging with normal direction of blood flow towards the liver. Other: None. IMPRESSION: Increased echogenicity and nodularity in the surface in the liver suggest cirrhosis. No focal abnormalities are seen in the visualized portions of liver. No sonographic abnormalities are seen in gallbladder. There is no dilation of bile  ducts. Electronically Signed   By: 03/01/2022 M.D.   On: 12/30/2021 16:23        Scheduled Meds:  lactulose  20 g Oral BID   metoCLOPramide (REGLAN) injection  10 mg Intravenous Once   nadolol  20 mg Oral Daily   sertraline  100 mg Oral Daily   Continuous Infusions:  sodium chloride 75 mL/hr at 01/01/22 0602   sodium chloride     sodium chloride     cefTRIAXone (ROCEPHIN)  IV 2 g (01/01/22 0853)   octreotide (SANDOSTATIN) 500 mcg in sodium chloride 0.9 % 250 mL (2 mcg/mL) infusion Stopped (01/01/22 1138)   pantoprazole 8 mg/hr (01/01/22 0304)     LOS: 2 days    Time spent: 50 minutes spent on chart review, discussion with nursing staff, consultants, updating family and interview/physical exam; more than 50% of that time was spent in counseling and/or coordination of care.  Joseph Art, DO [facility] Available via Epic secure chat 7am-7pm After these hours, please refer to coverage provider listed on [email or web address] 01/01/2022, 11:40 AM

## 2022-01-01 NOTE — Interval H&P Note (Signed)
History and Physical Interval Note:  01/01/2022 12:43 PM  [name]  has presented today for surgery, with the diagnosis of hematemesis, cirrhosis, history of varices.  The various methods of treatment have been discussed with the patient and family. After consideration of risks, benefits and other options for treatment, the patient has consented to  Procedure(s): ESOPHAGOGASTRODUODENOSCOPY (EGD) WITH PROPOFOL (N/A) as a surgical intervention.  The patient's history has been reviewed, patient examined, no change in status, stable for surgery.  I have reviewed the patient's chart and labs.  Questions were answered to the patient's satisfaction.     Venita Lick. Russella Dar

## 2022-01-01 NOTE — Transfer of Care (Signed)
Immediate Anesthesia Transfer of Care Note  Patient: [name] [facility]  Procedure(s) Performed: ESOPHAGOGASTRODUODENOSCOPY (EGD) WITH PROPOFOL  Patient Location: Endoscopy Unit  Anesthesia Type:MAC  Level of Consciousness: awake and drowsy  Airway & Oxygen Therapy: Patient Spontanous Breathing and Patient connected to face mask oxygen  Post-op Assessment: Report given to RN and Post -op Vital signs reviewed and stable  Post vital signs: Reviewed and stable  Last Vitals:  Vitals Value Taken Time  BP 82/56 01/01/22 1311  Temp    Pulse 81 01/01/22 1311  Resp 21 01/01/22 1311  SpO2 95 % 01/01/22 1311  Vitals shown include unvalidated device data.  Last Pain:  Vitals:   01/01/22 1228  TempSrc: Temporal  PainSc: 0-No pain      Patients Stated Pain Goal: 0 (66/29/47 6546)  Complications: No notable events documented.

## 2022-01-01 NOTE — Discharge Summary (Signed)
Physician Discharge Summary  [name] DOB: [birth year] DOA: 12/30/2021  PCP: Camillia Herter, NP  Admit date: 12/30/2021 Discharge date: 01/01/2022  Admitted From: home Discharge disposition: home   Recommendations for Outpatient Follow-Up:   Cbc 1 week Close GI follow up   Discharge Diagnosis:   Principal Problem:   GIB (gastrointestinal bleeding) Active Problems:   Alcoholic cirrhosis (HCC)   Mixed anxiety and depressive disorder   GERD (gastroesophageal reflux disease)   Hyponatremia   Elevated LFTs   Portal hypertensive gastropathy ([place])    Discharge Condition: Improved.  Diet recommendation: Low sodium, heart healthy  Wound care: None.  Code status: Full.   History of Present Illness:   [name] is a 41 y.o. male with medical history significant of alcoholic cirrhosis, anxiety/depression, GERD. Presenting with hematemesis. He reports early this morning around 0200hrs, he had sudden onset hematemesis. Also during this time, he noticed black stools. He didn't try any meds to help the N/V. He has dizziness with his vomiting, but no other symptoms. He denies current alcohol use. When her vomiting persisted, he decided to come to the ED for evaluation. He denies any other aggravating or alleviating factors.     Hospital Course by Problem:   Upper GI bleed Hx esophageal variceal bleeding Patient presenting to the ED with hematemesis associated with dark stools.  Denies any current alcohol abuse, no NSAIDs or antiplatelet/anticoagulant use.  Hemoglobin 15.2 on arrival. --[facility] following; appreciate assistance --EGD-bled from portal hypertensive gastropathy which has stopped. Can advance diet and can discharge from GI standpoint with follow up with Dr. Havery Moros- no changed to out patient meds --Transfuse for hemoglobin less than 7.0   Elevated ammonia level No signs of encephalopathy. -- Lactulose 20g PO BID, goal 2-3 BMs  daily   Hyponatremia: Resolved Na 132 on admission, improved to 138 with IV fluid hydration, likely hypovolemic hyponatremia.   EtOH cirrhosis Ultrasound right upper quadrant unrevealing other than known cirrhosis, no bile duct dilation or abnormalities within the gallbladder.  INR 1.3.  Meld-Na = 14.  --Nadolol 20 mg p.o. daily   Anxiety/depression:  --Sertraline 100 mg p.o. daily -- Hydroxyzine 25 mg p.o. every 8 hours as needed anxiety      Medical Consultants:   GI   Discharge Exam:   Vitals:   01/01/22 1323 01/01/22 1330  BP:  120/82  Pulse: 63 61  Resp: 18 13  Temp:    SpO2: 100% 98%   Vitals:   01/01/22 1317 01/01/22 1320 01/01/22 1323 01/01/22 1330  BP: 104/70 110/70  120/82  Pulse: (!) 59 (!) 56 63 61  Resp: 20 19 18 13   Temp:      TempSrc:      SpO2: 100% 100% 100% 98%  Weight:      Height:        General exam: Appears calm and comfortable.    The results of significant diagnostics from this hospitalization (including imaging, microbiology, ancillary and laboratory) are listed below for reference.     Procedures and Diagnostic Studies:   US Abdomen Limited RUQ (LIVER/GB)  Result Date: 12/30/2021 CLINICAL DATA:  Abnormal liver function tests EXAM: ULTRASOUND ABDOMEN LIMITED RIGHT UPPER QUADRANT COMPARISON:  08/19/2021 FINDINGS: Gallbladder: No gallstones or wall thickening visualized. No sonographic Murphy sign noted by sonographer. Common bile duct: Diameter: 2 mm Liver: There is inhomogeneous increased echogenicity. There is nodularity in liver surface. No focal abnormalities are seen in visualized  portions of liver. Portal vein is patent on color Doppler imaging with normal direction of blood flow towards the liver. Other: None. IMPRESSION: Increased echogenicity and nodularity in the surface in the liver suggest cirrhosis. No focal abnormalities are seen in the visualized portions of liver. No sonographic abnormalities are seen in gallbladder. There is  no dilation of bile ducts. Electronically Signed   By: Ernie Avena M.D.   On: 12/30/2021 16:23     Labs:   Basic Metabolic Panel: Recent Labs  Lab 12/30/21 1033 12/31/21 0015 01/01/22 0423  NA 132* 138 141  K 3.7 4.2 4.1  CL 88* 97* 107  CO2 22 28 27   GLUCOSE 102* 108* 100*  BUN 14 15 9   CREATININE 0.65 0.85 0.87  CALCIUM 9.7 8.8* 8.7*  MG 2.0  --  1.8   GFR Estimated Creatinine Clearance: 93.7 mL/min (by C-G formula based on SCr of 0.87 mg/dL). Liver Function Tests: Recent Labs  Lab 12/30/21 1033 12/31/21 0015 01/01/22 0423  AST 91* 87* 71*  ALT 54* 54* 51*  ALKPHOS 50 45 38  BILITOT 2.5* 3.1* 2.0*  PROT 8.3* 7.3 6.5  ALBUMIN 4.6 3.8 3.4*   Recent Labs  Lab 12/30/21 1033  LIPASE 29   Recent Labs  Lab 12/30/21 1033  AMMONIA 52*   Coagulation profile Recent Labs  Lab 12/30/21 1033  INR 1.3*    CBC: Recent Labs  Lab 12/30/21 1033 12/30/21 1759 12/31/21 0015 12/31/21 1341 12/31/21 2213 01/01/22 0423  WBC 10.1  --  6.9  --   --  5.7  HGB 15.2 14.0 13.5 12.0* 13.4 13.4  HCT 42.6 40.3 38.3* 35.3* 39.3 39.9  MCV 92.2  --  96.0  --   --  97.8  PLT 185  --  122*  --   --  104*   Cardiac Enzymes: No results for input(s): "CKTOTAL", "CKMB", "CKMBINDEX", "TROPONINI" in the last 168 hours. BNP: Invalid input(s): "POCBNP" CBG: No results for input(s): "GLUCAP" in the last 168 hours. D-Dimer No results for input(s): "DDIMER" in the last 72 hours. Hgb A1c No results for input(s): "HGBA1C" in the last 72 hours. Lipid Profile No results for input(s): "CHOL", "HDL", "LDLCALC", "TRIG", "CHOLHDL", "LDLDIRECT" in the last 72 hours. Thyroid function studies No results for input(s): "TSH", "T4TOTAL", "T3FREE", "THYROIDAB" in the last 72 hours.  Invalid input(s): "FREET3" Anemia work up No results for input(s): "VITAMINB12", "FOLATE", "FERRITIN", "TIBC", "IRON", "RETICCTPCT" in the last 72 hours. Microbiology No results found for this or any  previous visit (from the past 240 hour(s)).   Discharge Instructions:   Discharge Instructions     Diet - low sodium heart healthy   Complete by: As directed    Increase activity slowly   Complete by: As directed       Allergies as of 01/01/2022       Reactions   Penicillins Hives, Other (See Comments)   Occurred when he was an infant        Medication List     TAKE these medications    folic acid 400 MCG tablet Commonly known as: FOLVITE Take 400 mcg by mouth daily.   hydrocortisone cream 1 % Apply 1 application topically daily as needed for itching.   hydrOXYzine 25 MG capsule Commonly known as: VISTARIL Take 1 capsule (25 mg total) by mouth every 8 (eight) hours as needed. What changed: reasons to take this   Iron 325 (65 Fe) MG Tabs Take 1 tablet (325  mg total) by mouth daily. What changed: when to take this   lactulose 10 GM/15ML solution Commonly known as: CHRONULAC Take 22.5 mLs (15 g total) by mouth in the morning and at bedtime.   Magnesium 500 MG Tabs Take 500 mg by mouth in the morning and at bedtime.   multivitamin with minerals Tabs tablet Take 1 tablet by mouth daily. What changed: when to take this   nadolol 20 MG tablet Commonly known as: CORGARD TAKE 1 TABLET (20 MG TOTAL) BY MOUTH DAILY. What changed: how much to take   oxymetazoline 0.05 % nasal spray Commonly known as: AFRIN Place 1 spray into both nostrils 2 (two) times daily as needed for congestion.   pantoprazole 40 MG tablet Commonly known as: PROTONIX Take 1 tablet (40 mg total) by mouth 2 (two) times daily before a meal.   sertraline 100 MG tablet Commonly known as: ZOLOFT Take 1 tablet (100 mg total) by mouth daily.   thiamine 100 MG tablet Commonly known as: Vitamin B-1 Take 100 mg by mouth daily.        Follow-up Information     Doree Albee, PA-C Follow up on 02/05/2022.   Specialty: Gastroenterology Why: Wednesday, 02/05/22 at 9:15 am, please arrive  15-20 mins early for your appointment. Call the office if you need to change it. Contact information: [address] [place] [address] [phone]         Rema Fendt, NP Follow up in 1 week(s).   Specialty: Nurse Practitioner Why: cbc Contact information: [address] [place] [address] [phone]                  Time coordinating discharge: 35 min  Signed:  Joseph Art DO  [facility] 01/01/2022, 1:40 PM

## 2022-01-02 ENCOUNTER — Telehealth: Payer: Self-pay | Admitting: *Deleted

## 2022-01-02 NOTE — Patient Outreach (Addendum)
  Care Coordination [facility] Note Transition Care Management Follow-up Telephone Call Date of discharge and from where: 01/01/22 [facility] How have you been since you were released from the hospital? Patient states  he is doing well. Any questions or concerns? No  Items Reviewed: Did the pt receive and understand the discharge instructions provided? Yes  Medications obtained and verified? Yes  Other? No  Any new allergies since your discharge? No  Dietary orders reviewed? Yes Do you have support at home? Yes   Home Care and Equipment/Supplies: Were home health services ordered? no If so, what is the name of the agency? N/A  Has the agency set up a time to come to the patient's home? not applicable Were any new equipment or medical supplies ordered?  No What is the name of the medical supply agency? N/A Were you able to get the supplies/equipment? not applicable Do you have any questions related to the use of the equipment or supplies? No  Functional Questionnaire: (I = Independent and D = Dependent) ADLs: I  Bathing/Dressing- I  Meal Prep- I  Eating- I  Maintaining continence- I  Transferring/Ambulation- I  Managing Meds- I  Follow up appointments reviewed:  PCP Hospital f/u appt confirmed? No  , nurse will inform Steffainie Cairrikier [facility] f/u appt confirmed? Yes  Scheduled to see [facility] on 02/05/22 @ 0915. Are transportation arrangements needed? No  If their condition worsens, is the pt aware to call PCP or go to the Emergency Dept.? Yes Was the patient provided with contact information for the PCP's office or ED? Yes Was to pt encouraged to call back with questions or concerns? Yes  SDOH assessments and interventions completed:   Yes  Care Coordination Interventions Activated:  Yes   Care Coordination Interventions:  PCP follow up appointment requested    Encounter Outcome:  Pt. Visit Completed    Blanchie Serve RN, BSN [facility]  Care Management [facility] [phone] [email or web address]

## 2022-01-03 ENCOUNTER — Encounter (HOSPITAL_COMMUNITY): Payer: Self-pay | Admitting: Gastroenterology

## 2022-01-14 ENCOUNTER — Encounter (HOSPITAL_COMMUNITY): Payer: Self-pay | Admitting: Gastroenterology

## 2022-01-14 NOTE — Addendum Note (Signed)
Addendum  created 01/14/22 1408 by Brennan Bailey, MD   Intraprocedure Event edited, Intraprocedure Staff edited

## 2022-01-17 ENCOUNTER — Other Ambulatory Visit: Payer: Self-pay

## 2022-01-17 DIAGNOSIS — F419 Anxiety disorder, unspecified: Secondary | ICD-10-CM

## 2022-01-17 MED ORDER — SERTRALINE HCL 100 MG PO TABS: 100.0000 mg | ORAL_TABLET | Freq: Every day | ORAL | 0 refills | Status: DC

## 2022-01-22 ENCOUNTER — Ambulatory Visit (HOSPITAL_COMMUNITY): Payer: Medicare Other | Admitting: Student in an Organized Health Care Education/Training Program

## 2022-01-22 ENCOUNTER — Encounter (HOSPITAL_COMMUNITY): Payer: Self-pay

## 2022-02-03 NOTE — Progress Notes (Unsigned)
02/03/2022 [name] [id number] [birth year]  Referring provider: Rema Fendt, NP Primary GI doctor: Dr. Adela Lank  ASSESSMENT AND PLAN:   Assessment: 41 y.o. male here for assessment of the following: No diagnosis found.  Plan:  No orders of the defined types were placed in this encounter.   No orders of the defined types were placed in this encounter.   History of Present Illness:  41 y.o. male  with a past medical history of alcoholic cirrhosis, history of variceal bleeding (2017), history of hepatic encephalopathy  and others listed below, returns to clinic today for evaluation of hospital follow-up for hematemesis.  08/01/2021 endoscopy with Dr. Adela Lank showed grade 1 disease that flattened with insufflation, normal stomach and duodenum.  Did show portal hypertensive gastropathy 05/21/2021 Endoscopy with Dr. Christella Hartigan medium size esophageal varices distal mid esophagus 7 ligating bands complete eradication mild portal gastropathy 04/16/2021 endoscopy with Dr. Leonides Schanz 6 ligating bands completely eradicated  Patient has been evaluated by [facility] hepatology however MELD score has not been high enough. Patient's been on nadolol 20 mg daily, has been considered for TIPS in the past. Went to the ER with hematemesis 3 times and one episode of black stools.  States has not had alcohol within the last year however ER note states had alcohol 2 days ago when questioned. 01/01/2022-EGD Dr. Russella Dar during hospitalization for hematemesis showed grade 1 varices flattened with insufflation, moderate portal hypertensive gastropathy.  Suspected from portal gastropathy.  {cirrhosishepaticencephalopathy:26796} He {Denies/complains:31533} swelling.  He {Actions; are/are not:16769} on spironolactone and lasix.  Wt Readings from Last 3 Encounters:  01/01/22 132 lb (59.9 kg)  12/24/21 145 lb (65.8 kg)  08/27/21 145 lb (65.8 kg)   Last HCC screen: *** Last AFP 03/12/2021 4.4  Last INR:  12/30/2021 1.3   Hepatitis immunity status:  08/15/2020 HepA REACTIVE  08/15/2020 HepAsAB REACTIVE   ?PETH test  ***  He  reports that he has quit smoking. His smoking use included cigarettes. He smoked an average of 1 pack per day. He has never used smokeless tobacco. He reports that he does not currently use alcohol after a past usage of about 3.0 standard drinks of alcohol per week. He reports current drug use. Frequency: 7.00 times per week. Drug: Marijuana. His family history includes Crohn's disease in his maternal uncle; Heart attack in his maternal grandfather and maternal uncle; Peptic Ulcer in his paternal aunt; Stroke in his maternal grandmother and paternal grandfather.   Current Medications:    Current Outpatient Medications (Cardiovascular):    nadolol (CORGARD) 20 MG tablet, TAKE 1 TABLET (20 MG TOTAL) BY MOUTH DAILY. (Patient taking differently: Take 20 mg by mouth daily.)  Current Outpatient Medications (Respiratory):    oxymetazoline (AFRIN) 0.05 % nasal spray, Place 1 spray into both nostrils 2 (two) times daily as needed for congestion.   Current Outpatient Medications (Hematological):    Ferrous Sulfate (IRON) 325 (65 Fe) MG TABS, Take 1 tablet (325 mg total) by mouth daily. (Patient taking differently: Take 325 mg by mouth daily with breakfast.)   folic acid (FOLVITE) 400 MCG tablet, Take 400 mcg by mouth daily.  Current Outpatient Medications (Other):    hydrocortisone cream 1 %, Apply 1 application topically daily as needed for itching.   hydrOXYzine (VISTARIL) 25 MG capsule, Take 1 capsule (25 mg total) by mouth every 8 (eight) hours as needed. (Patient taking differently: Take 25 mg by mouth every 8 (eight) hours as needed for anxiety.)   lactulose (  CHRONULAC) 10 GM/15ML solution, Take 22.5 mLs (15 g total) by mouth in the morning and at bedtime.   Magnesium 500 MG TABS, Take 500 mg by mouth in the morning and at bedtime.   Multiple Vitamin (MULTIVITAMIN WITH  MINERALS) TABS tablet, Take 1 tablet by mouth daily. (Patient taking differently: Take 1 tablet by mouth daily with breakfast.)   pantoprazole (PROTONIX) 40 MG tablet, Take 1 tablet (40 mg total) by mouth 2 (two) times daily before a meal.   sertraline (ZOLOFT) 100 MG tablet, Take 1 tablet (100 mg total) by mouth daily.   thiamine (VITAMIN B-1) 100 MG tablet, Take 100 mg by mouth daily.  Surgical History:  He  has a past surgical history that includes Esophagogastroduodenoscopy (Left, 08/21/2015); Esophagogastroduodenoscopy (egd) with propofol (N/A, 08/30/2019); esophageal banding (08/30/2019); Esophagogastroduodenoscopy (egd) with propofol (N/A, 10/10/2019); esophageal banding (10/10/2019); Esophagogastroduodenoscopy (N/A, 12/05/2019); Hot hemostasis (N/A, 12/05/2019); gastric varices banding (12/05/2019); IR Radiologist Eval & Mgmt (12/13/2019); Esophagogastroduodenoscopy (egd) with propofol (N/A, 12/23/2019); esophageal banding (N/A, 12/23/2019); Esophagogastroduodenoscopy (egd) with propofol (N/A, 01/12/2020); esophageal banding (N/A, 01/12/2020); Esophagogastroduodenoscopy (egd) with propofol (N/A, 09/10/2020); esophageal banding (N/A, 09/10/2020); Esophagogastroduodenoscopy (egd) with propofol (N/A, 04/16/2021); esophageal banding (04/16/2021); Esophagogastroduodenoscopy (egd) with propofol (N/A, 05/21/2021); esophageal banding (N/A, 05/21/2021); Esophagogastroduodenoscopy (egd) with propofol (N/A, 08/01/2021); and Esophagogastroduodenoscopy (egd) with propofol (N/A, 01/01/2022).  Current Medications, Allergies, Past Medical History, Past Surgical History, Family History and Social History were reviewed in [employer] record.  Physical Exam: There were no vitals taken for this visit. General:   Pleasant, well developed male in no acute distress Heart : Regular rate and rhythm; no murmurs Pulm: Clear anteriorly; no wheezing Abdomen:  {BlankSingle:19197::"Distended","Ridged","Soft"},  {BlankSingle:19197::"Flat","Obese","Non-distended"} AB, {BlankSingle:19197::"Absent","Hyperactive, tinkling","Hypoactive","Sluggish","Active"} bowel sounds. {actendernessAB:27319} tenderness {anatomy; site abdomen:5010}. {BlankMultiple:19196::"Without guarding","With guarding","Without rebound","With rebound"}, No organomegaly appreciated. Rectal: {acrectalexam:27461} Extremities:  {With/without:5700}  edema. Neurologic:  Alert and  oriented x4;  No focal deficits.  Psych:  Cooperative. Normal mood and affect.   Vladimir Crofts, PA-C 02/03/22

## 2022-02-05 ENCOUNTER — Encounter: Payer: Self-pay | Admitting: Physician Assistant

## 2022-02-05 ENCOUNTER — Other Ambulatory Visit (INDEPENDENT_AMBULATORY_CARE_PROVIDER_SITE_OTHER): Payer: Medicare Other

## 2022-02-05 ENCOUNTER — Ambulatory Visit (INDEPENDENT_AMBULATORY_CARE_PROVIDER_SITE_OTHER): Payer: Medicare Other | Admitting: Physician Assistant

## 2022-02-05 VITALS — BP 130/70 | HR 82 | Ht 67.0 in | Wt 149.0 lb

## 2022-02-05 DIAGNOSIS — K7030 Alcoholic cirrhosis of liver without ascites: Secondary | ICD-10-CM

## 2022-02-05 DIAGNOSIS — I8500 Esophageal varices without bleeding: Secondary | ICD-10-CM | POA: Diagnosis not present

## 2022-02-05 DIAGNOSIS — K219 Gastro-esophageal reflux disease without esophagitis: Secondary | ICD-10-CM | POA: Diagnosis not present

## 2022-02-05 DIAGNOSIS — K766 Portal hypertension: Secondary | ICD-10-CM

## 2022-02-05 DIAGNOSIS — K7682 Hepatic encephalopathy: Secondary | ICD-10-CM | POA: Diagnosis not present

## 2022-02-05 DIAGNOSIS — K3189 Other diseases of stomach and duodenum: Secondary | ICD-10-CM

## 2022-02-05 LAB — CBC WITH DIFFERENTIAL/PLATELET
Basophils Absolute: 0.1 10*3/uL (ref 0.0–0.1)
Basophils Relative: 2.3 % (ref 0.0–3.0)
Eosinophils Absolute: 0.1 10*3/uL (ref 0.0–0.7)
Eosinophils Relative: 1.9 % (ref 0.0–5.0)
HCT: 42.4 % (ref 39.0–52.0)
Hemoglobin: 14.2 g/dL (ref 13.0–17.0)
Lymphocytes Relative: 9.8 % — ABNORMAL LOW (ref 12.0–46.0)
Lymphs Abs: 0.6 10*3/uL — ABNORMAL LOW (ref 0.7–4.0)
MCHC: 33.6 g/dL (ref 30.0–36.0)
MCV: 96.8 fl (ref 78.0–100.0)
Monocytes Absolute: 1.2 10*3/uL — ABNORMAL HIGH (ref 0.1–1.0)
Monocytes Relative: 20.9 % — ABNORMAL HIGH (ref 3.0–12.0)
Neutro Abs: 3.8 10*3/uL (ref 1.4–7.7)
Neutrophils Relative %: 65.1 % (ref 43.0–77.0)
Platelets: 152 10*3/uL (ref 150.0–400.0)
RBC: 4.38 Mil/uL (ref 4.22–5.81)
RDW: 13.5 % (ref 11.5–15.5)
WBC: 5.8 10*3/uL (ref 4.0–10.5)

## 2022-02-05 LAB — COMPREHENSIVE METABOLIC PANEL
ALT: 29 U/L (ref 0–53)
AST: 31 U/L (ref 0–37)
Albumin: 3.8 g/dL (ref 3.5–5.2)
Alkaline Phosphatase: 55 U/L (ref 39–117)
BUN: 11 mg/dL (ref 6–23)
CO2: 31 mEq/L (ref 19–32)
Calcium: 9.1 mg/dL (ref 8.4–10.5)
Chloride: 102 mEq/L (ref 96–112)
Creatinine, Ser: 0.95 mg/dL (ref 0.40–1.50)
GFR: 98.97 mL/min (ref >60.00)
Glucose, Bld: 108 mg/dL — ABNORMAL HIGH (ref 70–99)
Potassium: 4.3 mEq/L (ref 3.5–5.1)
Sodium: 140 mEq/L (ref 135–145)
Total Bilirubin: 0.9 mg/dL (ref 0.2–1.2)
Total Protein: 7.5 g/dL (ref 6.0–8.3)

## 2022-02-05 LAB — PROTIME-INR
INR: 1.1 ratio — ABNORMAL HIGH (ref 0.8–1.0)
Prothrombin Time: 11.6 s (ref 9.6–13.1)

## 2022-02-05 NOTE — Progress Notes (Signed)
Agree with assessment and plan as outlined.  

## 2022-02-05 NOTE — Patient Instructions (Addendum)
Your provider has requested that you go to the basement level for lab work before leaving today. Press "B" on the elevator. The lab is located at the first door on the left as you exit the elevator.   TYLENOL (ACETAMINOPHEN) IS SAFE IN LIVER DISEASE: You can take tylenol (acetaminophen) up to 2,000 mg/day. This would be four extra strength (500mg ) tablets over 24 hours OR six regular strength (325 mg) tablets over 24 hours. Please be sure to read the ingredients of over the counter medications and prescription pain medications as many contain acetaminophen.   NO NSAIDS (ibuprofen, advil, naproxen, aleve, motrin...)  HEPATIC ENCEPHALOPATHY HEPATIC ENCEPHALOPATHY: Confusion caused by a build up of toxins in the blood due to the liver not being able to filter toxins. This can cause confusion mild or severe, increase falls. If you have been diagnosed with Hepatic encephalopathy, advised to not drive due to increased risk   LACTULOSE:  helps pull ammonia and other toxins from the blood into your stool when you have a bowel movement NO NEED TO CHECK AMMONIA LEVEL IN BLOOD! Only need to check if you are having symptoms. 30-67ml up to four times a day Take a dose in the morning If by lunch time you have not had AT LEAST 2 bowel movements take another dose If by dinner you have not had AT LEAST 2 bowel movements that day take another dose If by bedtime you still have not had 2 bowel movements take another dose Goal of 3-4 bowel movements per day Avoid taking with food as this will cause more gas  IF YOU ARE VERY SLEEPY, HARD FOR YOUR FAMILY TO WAKE YOU UP, OR FALLING ASLEEP DURING CONVERSATIONS -INCREASE LACTULOSE/GO TO THE EMERGENCY ROOM  XIFAXAN (rifaximin)- you are not on this, can add An antibiotic that helps limit excess bacteria in the intestine.  The bacteria can cause increased ammonia One 550mg  tablet twice daily  SUPPLEMENTS: multivitamin Zinc Branch Chain Amino acids (BCAA): 12  grams/day L-Ornithine L-Aspartate (LOLA): 6 grams three times/day ([email or web address]-armstrong.com/) L-Carnitine  PHYSICAL ACTIVITY It is important to continue to be active when you have cirrhosis. Exercise will help reduce muscle loss and weakness.   DIET/NUTRITION FOR CIRRHOSIS NO ALCOHOL YOUR GOALS Evening snack - high protein Supplements between meals to help meet calorie and protein goal: Boost Ensure Premier Protein Shakes Protein Greek yogurt Fish, chicken (NO RAW OR UNDERCOOKED FISH/SHELLFISH) Avoid pork and red meat Plant based protein (non-soy)/Vegan: Lentils, Chickpeas, Peanuts (non salted), almonds (non salted), quinoa, chia seeds  Plant based protein supplements (not soy)  Avoid/limit animal based protein supplements: whey, casein 4.  Low sodium (2,000 mg/day) A. Avoid: table salt, canned foods, deli meats, sausages, hot dogs, anything with a long shelf life B. Read nutrition labels and be aware of serving size. Don't go by percent of daily value.    I appreciate the opportunity to care for you. Vicie Mutters, PA-C

## 2022-02-07 LAB — AFP TUMOR MARKER: AFP-Tumor Marker: 6.5 ng/mL — ABNORMAL HIGH (ref <6.1)

## 2022-02-10 ENCOUNTER — Ambulatory Visit (HOSPITAL_COMMUNITY): Admit: 2022-02-10 | Payer: Medicare Other | Admitting: Gastroenterology

## 2022-02-10 ENCOUNTER — Other Ambulatory Visit: Payer: Self-pay

## 2022-02-10 ENCOUNTER — Encounter (HOSPITAL_COMMUNITY): Payer: Self-pay

## 2022-02-10 DIAGNOSIS — R7989 Other specified abnormal findings of blood chemistry: Secondary | ICD-10-CM

## 2022-02-10 SURGERY — ESOPHAGOGASTRODUODENOSCOPY (EGD) WITH PROPOFOL
Anesthesia: Monitor Anesthesia Care

## 2022-02-21 ENCOUNTER — Other Ambulatory Visit: Payer: Self-pay

## 2022-02-21 MED ORDER — NADOLOL 20 MG PO TABS: ORAL_TABLET | Freq: Every day | ORAL | 0 refills | Status: DC

## 2022-02-21 NOTE — Telephone Encounter (Signed)
Nadolol refilled , okay according to last office note.

## 2022-02-22 IMAGING — DX DG CHEST 1V PORT
1 series · 1 of 1 positions shown · non-contrast
Comparison: 08/30/2019

CLINICAL DATA: Hematemesis

EXAM:
PORTABLE CHEST 1 VIEW

[chest]
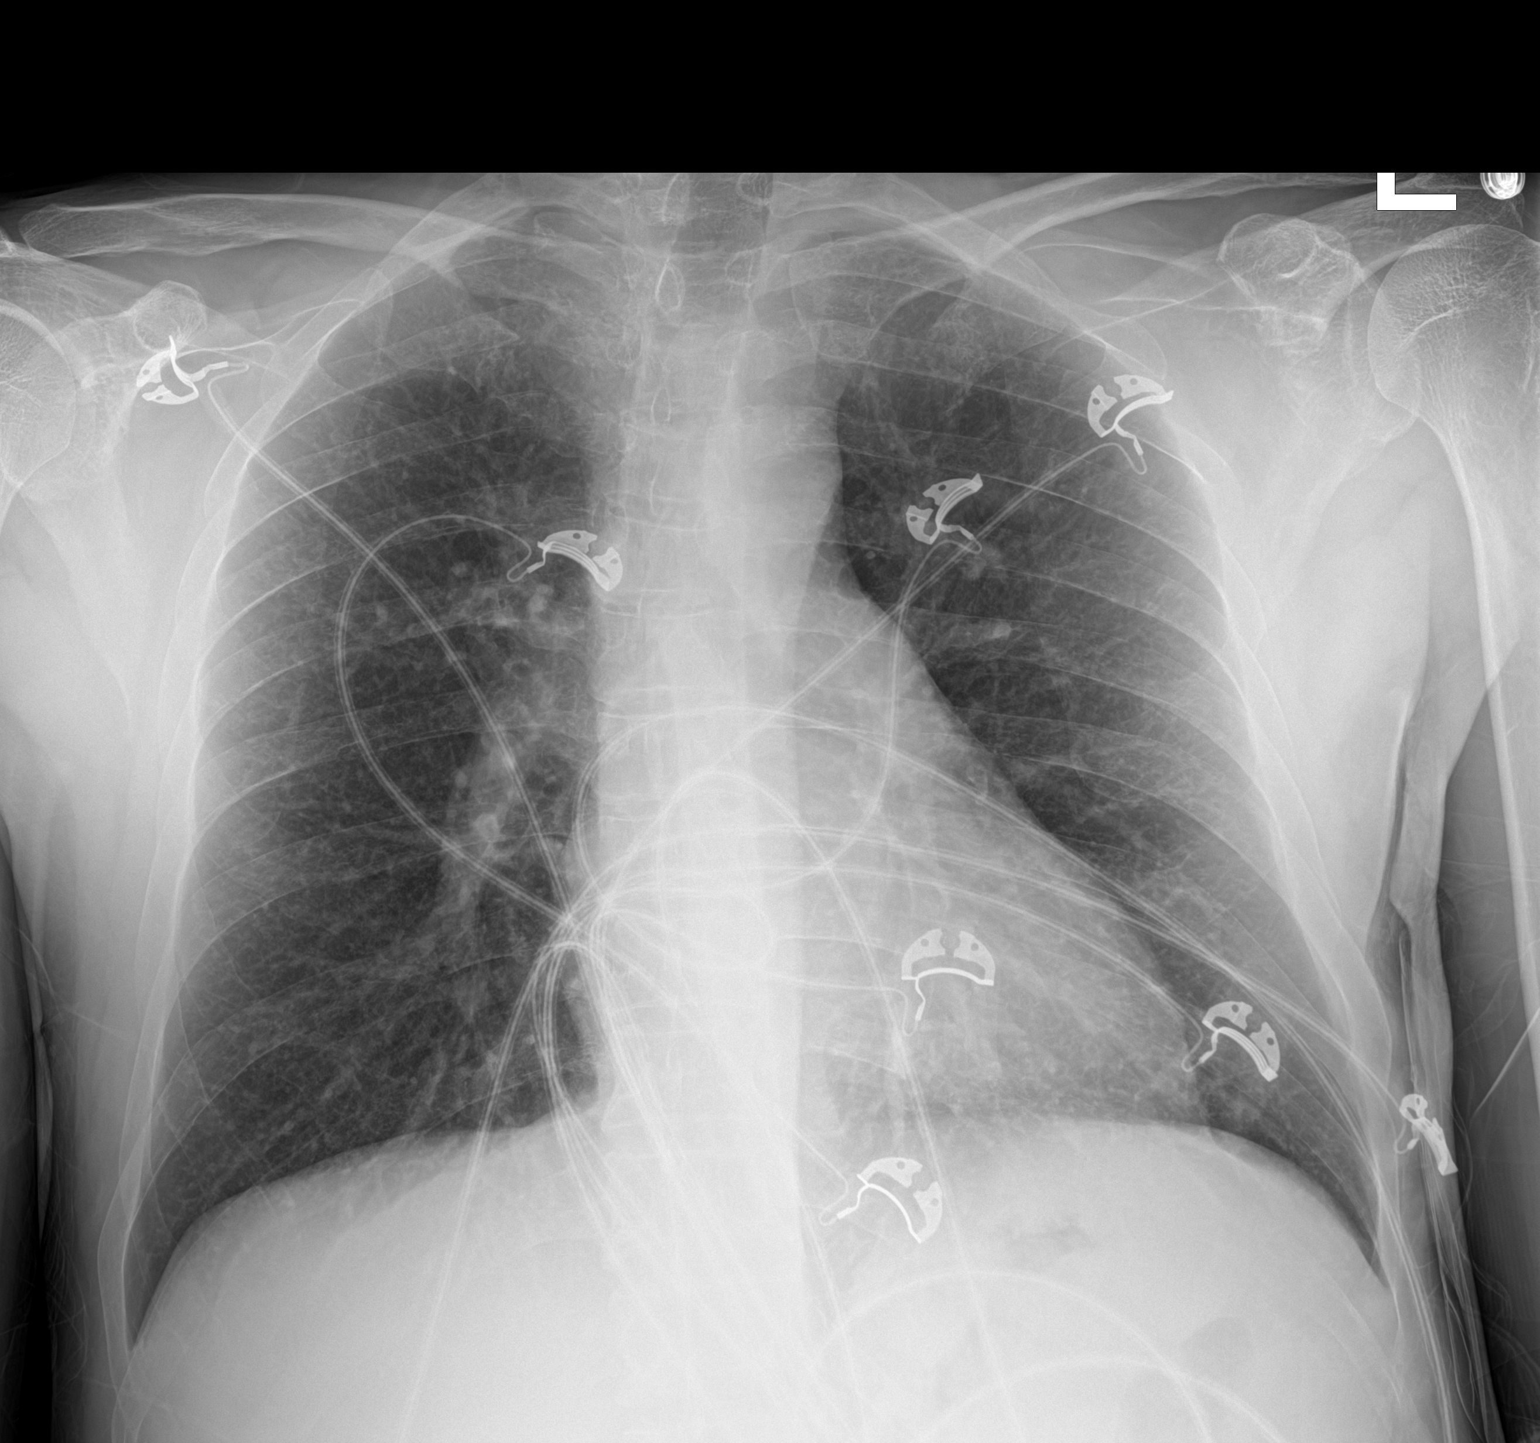

[1 of 1 positions shown; findings below may reference images not displayed]

FINDINGS: Normal heart size and mediastinal contours. No acute infiltrate or
edema. No effusion or pneumothorax. No acute osseous findings.

Artifact from EKG leads
IMPRESSION: Negative portable chest.

## 2022-03-18 ENCOUNTER — Other Ambulatory Visit (INDEPENDENT_AMBULATORY_CARE_PROVIDER_SITE_OTHER): Payer: Medicare Other

## 2022-03-18 DIAGNOSIS — R7989 Other specified abnormal findings of blood chemistry: Secondary | ICD-10-CM

## 2022-03-18 LAB — CBC WITH DIFFERENTIAL/PLATELET
Basophils Absolute: 0.1 10*3/uL (ref 0.0–0.1)
Basophils Relative: 1.4 % (ref 0.0–3.0)
Eosinophils Absolute: 0.2 10*3/uL (ref 0.0–0.7)
Eosinophils Relative: 2.2 % (ref 0.0–5.0)
HCT: 45.1 % (ref 39.0–52.0)
Hemoglobin: 15.6 g/dL (ref 13.0–17.0)
Lymphocytes Relative: 11.2 % — ABNORMAL LOW (ref 12.0–46.0)
Lymphs Abs: 0.9 10*3/uL (ref 0.7–4.0)
MCHC: 34.7 g/dL (ref 30.0–36.0)
MCV: 94.7 fl (ref 78.0–100.0)
Monocytes Absolute: 1.1 10*3/uL — ABNORMAL HIGH (ref 0.1–1.0)
Monocytes Relative: 14.7 % — ABNORMAL HIGH (ref 3.0–12.0)
Neutro Abs: 5.4 10*3/uL (ref 1.4–7.7)
Neutrophils Relative %: 70.5 % (ref 43.0–77.0)
Platelets: 170 10*3/uL (ref 150.0–400.0)
RBC: 4.76 Mil/uL (ref 4.22–5.81)
RDW: 13.2 % (ref 11.5–15.5)
WBC: 7.6 10*3/uL (ref 4.0–10.5)

## 2022-04-07 ENCOUNTER — Other Ambulatory Visit: Payer: Self-pay

## 2022-04-07 DIAGNOSIS — I8501 Esophageal varices with bleeding: Secondary | ICD-10-CM

## 2022-04-07 MED ORDER — PANTOPRAZOLE SODIUM 40 MG PO TBEC: 40.0000 mg | DELAYED_RELEASE_TABLET | Freq: Two times a day (BID) | ORAL | 5 refills | Status: DC

## 2022-04-11 ENCOUNTER — Other Ambulatory Visit: Payer: Self-pay | Admitting: Family

## 2022-04-11 DIAGNOSIS — F32A Depression, unspecified: Secondary | ICD-10-CM

## 2022-04-14 ENCOUNTER — Other Ambulatory Visit: Payer: Self-pay

## 2022-04-14 MED ORDER — LACTULOSE 10 GM/15ML PO SOLN: 15.0000 g | Freq: Two times a day (BID) | ORAL | 2 refills | Status: DC

## 2022-04-21 IMAGING — CT CT CTA ABD/PEL W/CM AND/OR W/O CM
2 of 9 series · 13 of 46 positions shown, 15 images · IV contrast (Omni 300)
Comparison: 09/18/2011

CLINICAL DATA: Alcoholic cirrhosis, recurrent ascites, acute upper
GI bleed status post banding of esophageal varices.

EXAM:
CTA ABDOMEN AND PELVIS WITHOUT AND WITH CONTRAST
TECHNIQUE: Multidetector CT imaging of the abdomen and pelvis was performed
using the standard protocol during bolus administration of
intravenous contrast. Multiplanar reconstructed images and MIPs were
obtained and reviewed to evaluate the vascular anatomy.
CONTRAST:  100mL OMNIPAQUE IOHEXOL 350 MG/ML SOLN

[Series 6: arterial 2.0 cor · coronal · arterial · 0.62mm/px · 3 of 129 slices shown]
[im 33/129  soft-tissue]
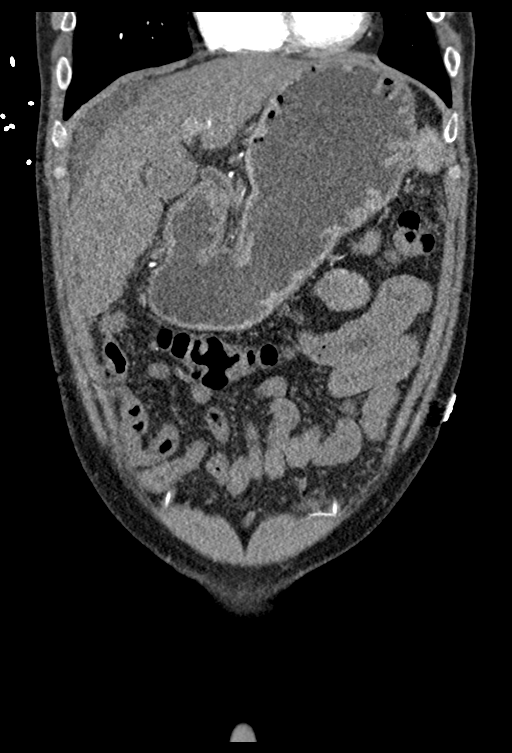
[im 65/129  soft-tissue]
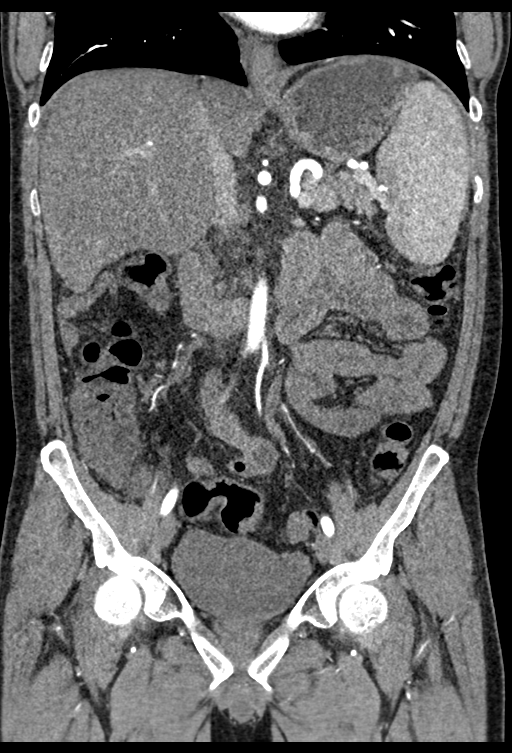
[im 97/129  soft-tissue]
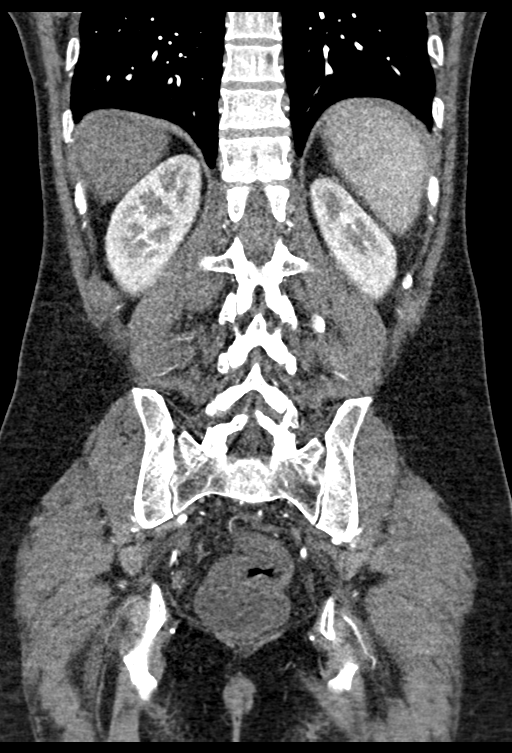

[Series 10: portal venous thins · axial · portal-venous · 0.73mm/px · z∈[+757,+1125]mm · 10 of 226 slices shown, 12 images]
[im 21/226  soft-tissue]
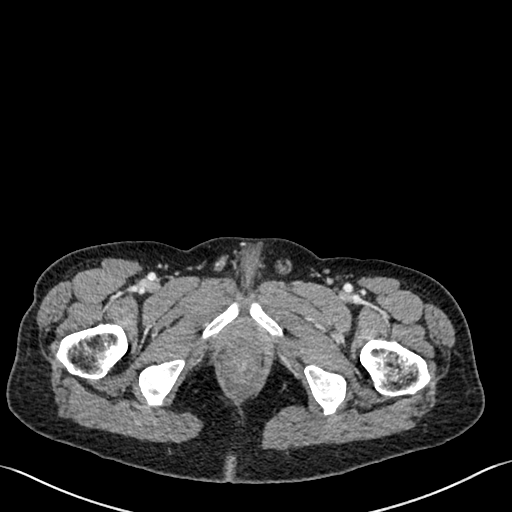
[im 21/226  bone]
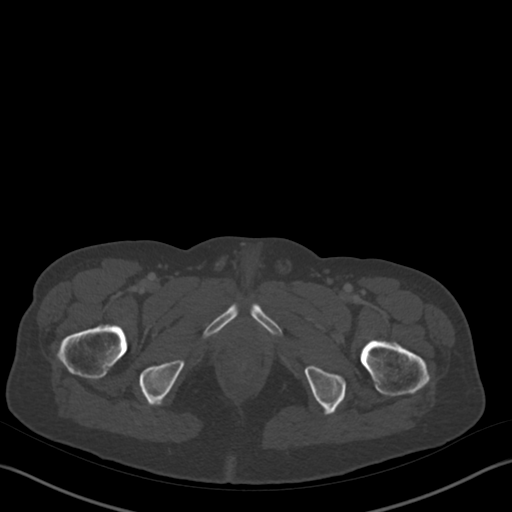
[im 41/226  soft-tissue]
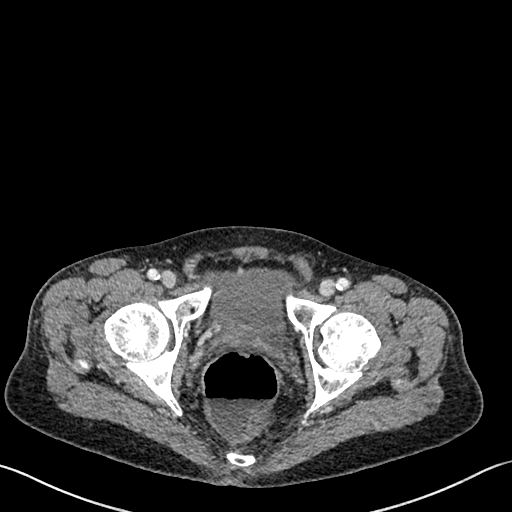
[im 62/226  soft-tissue]
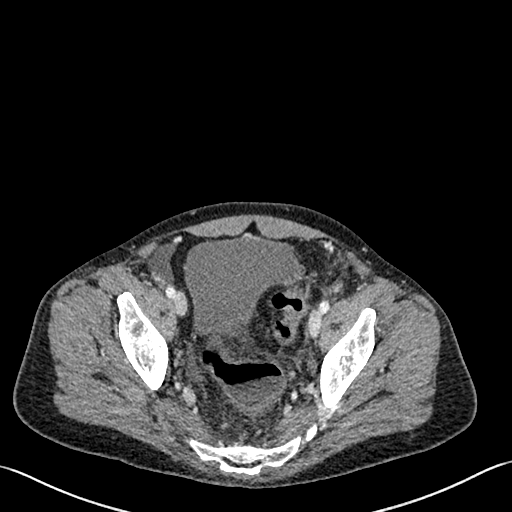
[im 82/226  soft-tissue]
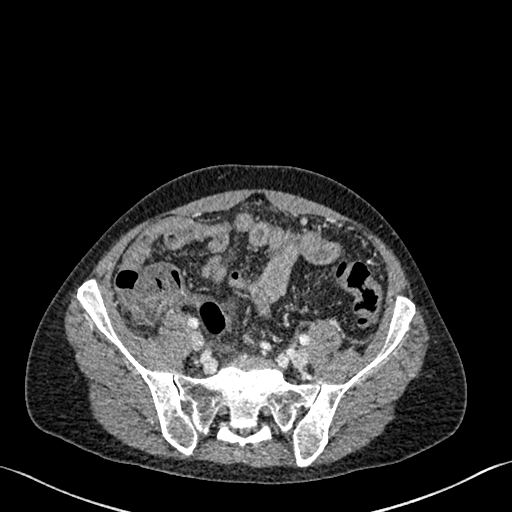
[im 103/226  soft-tissue]
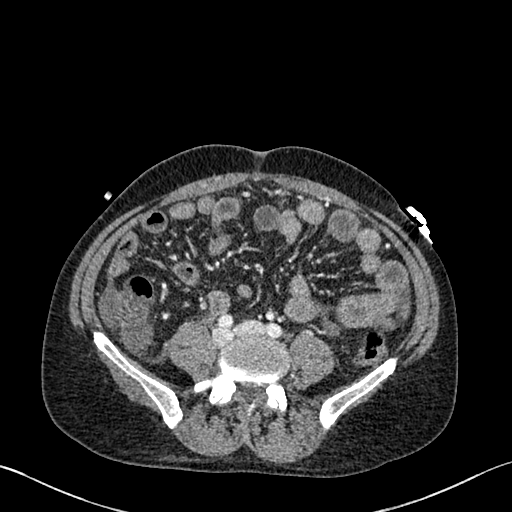
[im 123/226  soft-tissue]
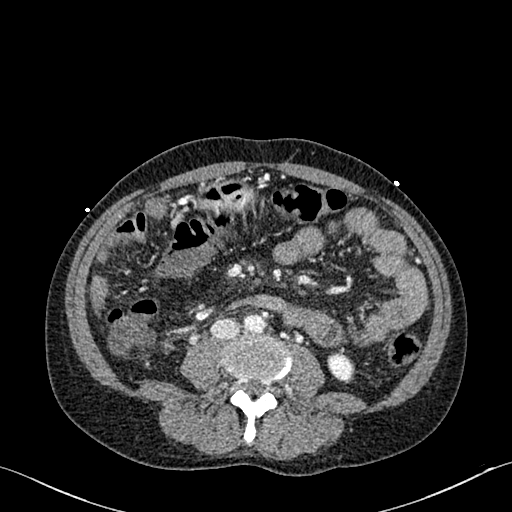
[im 144/226  soft-tissue]
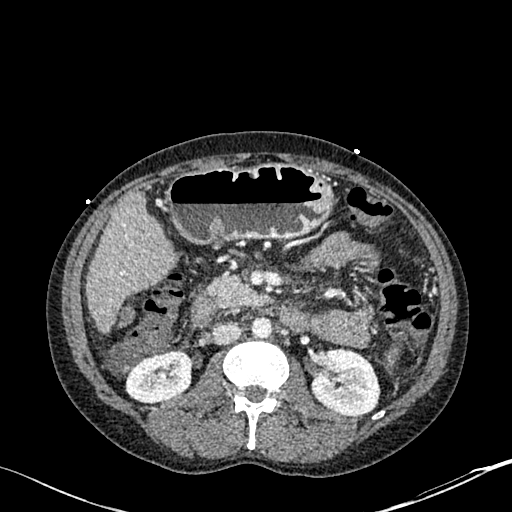
[im 164/226  soft-tissue]
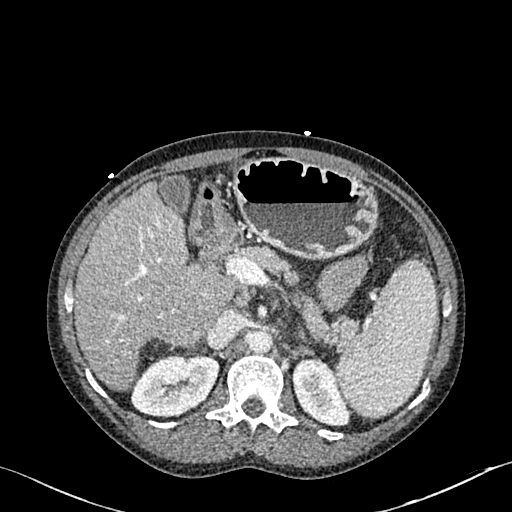
[im 185/226  soft-tissue]
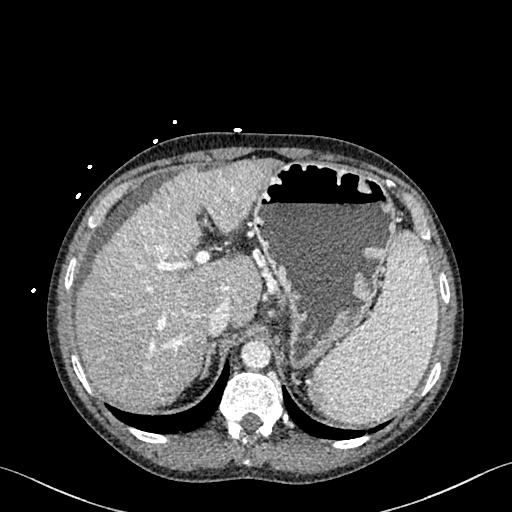
[im 185/226  bone]
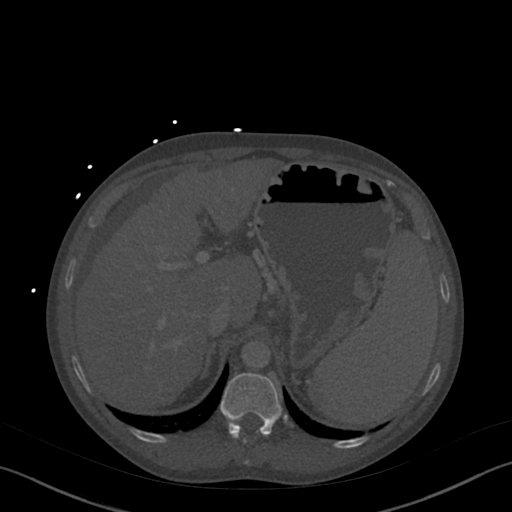
[im 205/226  soft-tissue]
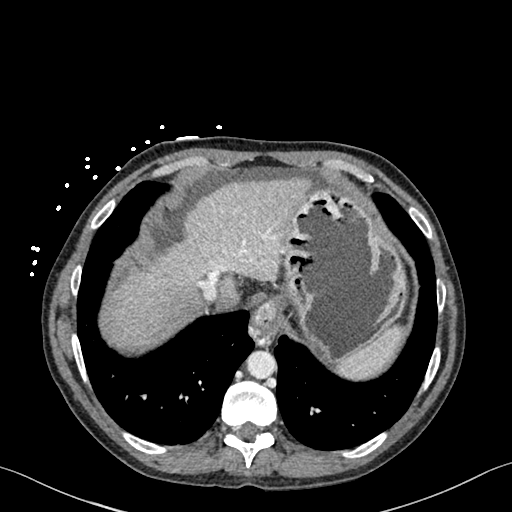

[13 of 46 positions shown; findings below may reference images not displayed]

FINDINGS: VASCULAR

Aorta: Normal caliber aorta without aneurysm, dissection, vasculitis
or significant stenosis.

Celiac: Patent without evidence of aneurysm, dissection, vasculitis
or significant stenosis.

SMA: Patent without evidence of aneurysm, dissection, vasculitis or
significant stenosis.

Renals: Both renal arteries are patent without evidence of aneurysm,
dissection, vasculitis, fibromuscular dysplasia or significant
stenosis.

IMA: Patent without evidence of aneurysm, dissection, vasculitis or
significant stenosis.

Inflow: Patent without evidence of aneurysm, dissection, vasculitis
or significant stenosis.

Proximal Outflow: Bilateral common femoral and visualized portions
of the superficial and profunda femoral arteries are patent without
evidence of aneurysm, dissection, vasculitis or significant
stenosis.

Veins: Hepatic veins patent. Right hepatic vein is diminutive and
there are 2 accessory right hepatic veins draining segments 5 and 6.
Portal vein patent. SMV and splenic vein patent. Enlarged left
gastric vein supplies esophageal varices. No gastric varices
identified. No splenorenal shunt. Patent renal veins, IVC, iliac
venous system.

Review of the MIP images confirms the above findings.

NON-VASCULAR

Lower chest: Circumferential wall thickening in the distal
esophagus. No pleural or pericardial effusion. Lung bases clear.

Hepatobiliary: Nodular contour suggesting cirrhosis. No focal
lesion. No biliary ductal dilatation. Gallbladder is physiologically
distended.

Pancreas: Unremarkable. No pancreatic ductal dilatation or
surrounding inflammatory changes.

Spleen: 18.3 cm length, no focal lesion

Adrenals/Urinary Tract: Adrenal glands unremarkable. Kidneys enhance
normally. No hydronephrosis. Urinary bladder physiologically
distended.

Stomach/Bowel: Stomach distended by ingested material. No gastric
varices. Small bowel is nondilated. Appendix not discretely
identified. No pericecal inflammatory/edematous change. The colon is
nondilated, unremarkable.

Lymphatic: No abdominal or pelvic adenopathy.

Reproductive: Prostate is unremarkable.

Other: Bilateral pelvic phleboliths. Small volume perihepatic
ascites. No free air.

Musculoskeletal: No acute or significant osseous findings.
IMPRESSION: 1. Cirrhosis with splenomegaly, small volume ascites, and esophageal
varices.

## 2022-05-08 ENCOUNTER — Other Ambulatory Visit: Payer: Self-pay

## 2022-05-08 DIAGNOSIS — F419 Anxiety disorder, unspecified: Secondary | ICD-10-CM

## 2022-05-08 MED ORDER — SERTRALINE HCL 100 MG PO TABS: 100.0000 mg | ORAL_TABLET | Freq: Every day | ORAL | 0 refills | Status: DC

## 2022-05-14 ENCOUNTER — Ambulatory Visit (INDEPENDENT_AMBULATORY_CARE_PROVIDER_SITE_OTHER): Payer: Medicare Other

## 2022-05-14 VITALS — Ht 67.0 in | Wt 140.0 lb

## 2022-05-14 DIAGNOSIS — Z0000 Encounter for general adult medical examination without abnormal findings: Secondary | ICD-10-CM | POA: Diagnosis not present

## 2022-05-14 NOTE — Progress Notes (Signed)
I connected with [name] today by telephone and verified that I am speaking with the correct person using two identifiers. Location patient: home Location provider: work Persons participating in the virtual visit: [name], Glenna Durand LPN.   I discussed the limitations, risks, security and privacy concerns of performing an evaluation and management service by telephone and the availability of in person appointments. I also discussed with the patient that there may be a patient responsible charge related to this service. The patient expressed understanding and verbally consented to this telephonic visit.    Interactive audio and video telecommunications were attempted between this provider and patient, however failed, due to patient having technical difficulties OR patient did not have access to video capability.  We continued and completed visit with audio only.     Vital signs may be patient reported or missing.  Subjective:   [name] is a 42 y.o. male who presents for an Initial Medicare Annual Wellness Visit.  Review of Systems     Cardiac Risk Factors include: male gender     Objective:    Today's Vitals   05/14/22 0841  Weight: 140 lb (63.5 kg)  Height: 5\' 7"  (1.702 m)   Body mass index is 21.93 kg/m.     05/14/2022    8:46 AM 01/01/2022   12:25 PM 12/30/2021    3:55 PM 08/01/2021    8:25 AM 05/21/2021    9:11 AM 04/21/2021    7:15 PM 04/16/2021    8:16 AM  Advanced Directives  Does Patient Have a Medical Advance Directive? No No No No No No No  Would patient like information on creating a medical advance directive?  No - Patient declined No - Guardian declined No - Patient declined No - Patient declined  No - Patient declined    Current Medications (verified) Outpatient Encounter Medications as of 05/14/2022  Medication Sig   Ferrous Sulfate (IRON) 325 (65 Fe) MG TABS Take 1 tablet (325 mg total) by mouth daily. (Patient taking differently:  Take 325 mg by mouth daily with breakfast.)   folic acid (FOLVITE) 676 MCG tablet Take 400 mcg by mouth daily.   hydrocortisone cream 1 % Apply 1 application topically daily as needed for itching.   hydrOXYzine (VISTARIL) 25 MG capsule TAKE 1 CAPSULE(25 MG) BY MOUTH EVERY 8 HOURS AS NEEDED   lactulose (CHRONULAC) 10 GM/15ML solution Take 22.5 mLs (15 g total) by mouth in the morning and at bedtime.   Magnesium 500 MG TABS Take 500 mg by mouth in the morning and at bedtime.   Multiple Vitamin (MULTIVITAMIN WITH MINERALS) TABS tablet Take 1 tablet by mouth daily. (Patient taking differently: Take 1 tablet by mouth daily with breakfast.)   nadolol (CORGARD) 20 MG tablet TAKE 1 TABLET (20 MG TOTAL) BY MOUTH DAILY.   oxymetazoline (AFRIN) 0.05 % nasal spray Place 1 spray into both nostrils 2 (two) times daily as needed for congestion.   pantoprazole (PROTONIX) 40 MG tablet Take 1 tablet (40 mg total) by mouth 2 (two) times daily before a meal.   sertraline (ZOLOFT) 100 MG tablet Take 1 tablet (100 mg total) by mouth daily.   thiamine (VITAMIN B-1) 100 MG tablet Take 100 mg by mouth daily.   No facility-administered encounter medications on file as of 05/14/2022.    Allergies (verified) Penicillins   History: Past Medical History:  Diagnosis Date   Anxiety    Phreesia 05/14/2020   Blood transfusion without reported diagnosis  Phreesia 05/14/2020   Cirrhosis of liver (HCC)    Depression    Phreesia 05/14/2020   Esophageal varices with hemorrhage (HCC)    ETOH abuse    GERD (gastroesophageal reflux disease)    Seizures (HCC)    Phreesia 05/14/2020   Substance abuse (HCC)    Phreesia 05/14/2020   Past Surgical History:  Procedure Laterality Date   ESOPHAGEAL BANDING  08/30/2019   Procedure: ESOPHAGEAL BANDING;  Surgeon: Kerin Salen, MD;  Location: [facility] ENDOSCOPY;  Service: Gastroenterology;;   ESOPHAGEAL BANDING  10/10/2019   Procedure: ESOPHAGEAL BANDING;  Surgeon: Kerin Salen, MD;   Location: [facility] ENDOSCOPY;  Service: Gastroenterology;;   ESOPHAGEAL BANDING N/A 12/23/2019   Procedure: ESOPHAGEAL BANDING;  Surgeon: Kathi Der, MD;  Location: [facility] ENDOSCOPY;  Service: Gastroenterology;  Laterality: N/A;   ESOPHAGEAL BANDING N/A 01/12/2020   Procedure: ESOPHAGEAL BANDING;  Surgeon: Kathi Der, MD;  Location: [facility] ENDOSCOPY;  Service: Gastroenterology;  Laterality: N/A;   ESOPHAGEAL BANDING N/A 09/10/2020   Procedure: ESOPHAGEAL BANDING;  Surgeon: Benancio Deeds, MD;  Location: [facility] ENDOSCOPY;  Service: Gastroenterology;  Laterality: N/A;   ESOPHAGEAL BANDING  04/16/2021   Procedure: ESOPHAGEAL BANDING;  Surgeon: Imogene Burn, MD;  Location: [facility] ENDOSCOPY;  Service: Gastroenterology;;   ESOPHAGEAL BANDING N/A 05/21/2021   Procedure: ESOPHAGEAL BANDING;  Surgeon: Rachael Fee, MD;  Location: [facility] ENDOSCOPY;  Service: Endoscopy;  Laterality: N/A;   ESOPHAGOGASTRODUODENOSCOPY Left 08/21/2015   Procedure: ESOPHAGOGASTRODUODENOSCOPY (EGD);  Surgeon: Charlott Rakes, MD;  Location: [facility] ENDOSCOPY;  Service: Endoscopy;  Laterality: Left;   ESOPHAGOGASTRODUODENOSCOPY N/A 12/05/2019   Procedure: ESOPHAGOGASTRODUODENOSCOPY (EGD);  Surgeon: Kathi Der, MD;  Location: [facility] ENDOSCOPY;  Service: Gastroenterology;  Laterality: N/A;   ESOPHAGOGASTRODUODENOSCOPY (EGD) WITH PROPOFOL N/A 08/30/2019   Procedure: ESOPHAGOGASTRODUODENOSCOPY (EGD) WITH PROPOFOL;  Surgeon: Kerin Salen, MD;  Location: [facility] ENDOSCOPY;  Service: Gastroenterology;  Laterality: N/A;   ESOPHAGOGASTRODUODENOSCOPY (EGD) WITH PROPOFOL N/A 10/10/2019   Procedure: ESOPHAGOGASTRODUODENOSCOPY (EGD) WITH PROPOFOL;  Surgeon: Kerin Salen, MD;  Location: [facility] ENDOSCOPY;  Service: Gastroenterology;  Laterality: N/A;   ESOPHAGOGASTRODUODENOSCOPY (EGD) WITH PROPOFOL N/A 12/23/2019   Procedure: ESOPHAGOGASTRODUODENOSCOPY (EGD) WITH PROPOFOL;  Surgeon: Kathi Der, MD;  Location: [facility] ENDOSCOPY;  Service: Gastroenterology;   Laterality: N/A;   ESOPHAGOGASTRODUODENOSCOPY (EGD) WITH PROPOFOL N/A 01/12/2020   Procedure: ESOPHAGOGASTRODUODENOSCOPY (EGD) WITH PROPOFOL;  Surgeon: Kathi Der, MD;  Location: [facility] ENDOSCOPY;  Service: Gastroenterology;  Laterality: N/A;   ESOPHAGOGASTRODUODENOSCOPY (EGD) WITH PROPOFOL N/A 09/10/2020   Procedure: ESOPHAGOGASTRODUODENOSCOPY (EGD) WITH PROPOFOL;  Surgeon: Benancio Deeds, MD;  Location: [facility] ENDOSCOPY;  Service: Gastroenterology;  Laterality: N/A;   ESOPHAGOGASTRODUODENOSCOPY (EGD) WITH PROPOFOL N/A 04/16/2021   Procedure: ESOPHAGOGASTRODUODENOSCOPY (EGD) WITH PROPOFOL;  Surgeon: Imogene Burn, MD;  Location: [facility] ENDOSCOPY;  Service: Gastroenterology;  Laterality: N/A;   ESOPHAGOGASTRODUODENOSCOPY (EGD) WITH PROPOFOL N/A 05/21/2021   Procedure: ESOPHAGOGASTRODUODENOSCOPY (EGD) WITH PROPOFOL;  Surgeon: Rachael Fee, MD;  Location: [facility] ENDOSCOPY;  Service: Endoscopy;  Laterality: N/A;   ESOPHAGOGASTRODUODENOSCOPY (EGD) WITH PROPOFOL N/A 08/01/2021   Procedure: ESOPHAGOGASTRODUODENOSCOPY (EGD) WITH PROPOFOL;  Surgeon: Benancio Deeds, MD;  Location: [facility] ENDOSCOPY;  Service: Gastroenterology;  Laterality: N/A;   ESOPHAGOGASTRODUODENOSCOPY (EGD) WITH PROPOFOL N/A 01/01/2022   Procedure: ESOPHAGOGASTRODUODENOSCOPY (EGD) WITH PROPOFOL;  Surgeon: Meryl Dare, MD;  Location: [facility] ENDOSCOPY;  Service: Gastroenterology;  Laterality: N/A;   GASTRIC VARICES BANDING  12/05/2019   Procedure: GASTRIC VARICES BANDING;  Surgeon: Kathi Der, MD;  Location: [facility] ENDOSCOPY;  Service: Gastroenterology;;  placed 6 bands   HOT  HEMOSTASIS N/A 12/05/2019   Procedure: HOT HEMOSTASIS (ARGON PLASMA COAGULATION/BICAP);  Surgeon: Otis Brace, MD;  Location: [facility] ENDOSCOPY;  Service: Gastroenterology;  Laterality: N/A;   IR RADIOLOGIST EVAL & MGMT  12/13/2019   Family History  Problem Relation Age of Onset   Other Mother        pre-diabetes   COPD Father        smoker   Meniere's disease  Father    Stroke Maternal Grandmother    Heart attack Maternal Grandfather    Stroke Paternal Grandfather    Crohn's disease Maternal Uncle    Heart attack Maternal Uncle    Peptic Ulcer Paternal Aunt    Social History   Socioeconomic History   Marital status: Single    Spouse name: Not on file   Number of children: 0   Years of education: Not on file   Highest education level: Not on file  Occupational History   Not on file  Tobacco Use   Smoking status: Former    Packs/day: 1.00    Types: Cigarettes   Smokeless tobacco: Never  Vaping Use   Vaping Use: Never used  Substance and Sexual Activity   Alcohol use: Not Currently    Alcohol/week: 3.0 standard drinks of alcohol    Types: 3 Standard drinks or equivalent per week    Comment: Alcoholism   Drug use: Yes    Frequency: 7.0 times per week    Types: Marijuana   Sexual activity: Not Currently  Other Topics Concern   Not on file  Social History Narrative   Not on file   Social Determinants of Health   Financial Resource Strain: Low Risk  (05/14/2022)   Overall Financial Resource Strain (CARDIA)    Difficulty of Paying Living Expenses: Not hard at all  Food Insecurity: No Food Insecurity (05/14/2022)   Hunger Vital Sign    Worried About Running Out of Food in the Last Year: Never true    Ran Out of Food in the Last Year: Never true  Transportation Needs: No Transportation Needs (05/14/2022)   PRAPARE - Hydrologist (Medical): No    Lack of Transportation (Non-Medical): No  Physical Activity: Sufficiently Active (05/14/2022)   Exercise Vital Sign    Days of Exercise per Week: 7 days    Minutes of Exercise per Session: 60 min  Stress: Stress Concern Present (05/14/2022)   [place]    Feeling of Stress : To some extent  Social Connections: Not on file    Tobacco Counseling Counseling given: Not Answered   Clinical  Intake:  Pre-visit preparation completed: Yes  Pain : No/denies pain     Nutritional Status: BMI of 19-24  Normal Nutritional Risks: Nausea/ vomitting/ diarrhea (diarrhea  due to medication)  How often do you need to have someone help you when you read instructions, pamphlets, or other written materials from your doctor or pharmacy?: 1 - Never  Diabetic? no  Interpreter Needed?: No  Information entered by :: NAllen LPN   Activities of Daily Living    05/14/2022    8:47 AM 05/14/2022    8:06 AM  In your present state of health, do you have any difficulty performing the following activities:  Hearing? 0 0  Vision? 1 1  Difficulty concentrating or making decisions? 1 1  Walking or climbing stairs? 0 0  Dressing or bathing? 0 0  Doing errands, shopping?  1 1  Comment gets lost   Preparing Food and eating ? N N  Using the Toilet? N Y  In the past six months, have you accidently leaked urine? N Y  Do you have problems with loss of bowel control? Y Y  Managing your Medications? N N  Managing your Finances? N N  Housekeeping or managing your Housekeeping? N N    Patient Care Team: Rema Fendt, NP as PCP - General (Nurse Practitioner)  Indicate any recent Medical Services you may have received from other than [facility] providers in the past year (date may be approximate).     Assessment:   This is a routine wellness examination for Pepe.  Hearing/Vision screen Vision Screening - Comments:: Regular eye exams, [facility]  Dietary issues and exercise activities discussed: Current Exercise Habits: Home exercise routine, Type of exercise: treadmill;strength training/weights, Time (Minutes): 60, Frequency (Times/Week): 7, Weekly Exercise (Minutes/Week): 420   Goals Addressed             This Visit's Progress    Patient Stated       05/14/2022, wants to have abs       Depression Screen    05/14/2022    8:47 AM 12/24/2021    8:22 AM 08/27/2021    8:09 AM  07/30/2021    2:36 PM 10/16/2020    2:36 PM 06/18/2020    9:54 AM 05/16/2020   10:50 AM  PHQ 2/9 Scores  PHQ - 2 Score 0 6 1 2 1 1 6   PHQ- 9 Score  22 3 4 4 6 15     Fall Risk    05/14/2022    8:46 AM 05/14/2022    8:06 AM 10/16/2020    2:36 PM 05/16/2020   10:51 AM  Fall Risk   Falls in the past year? 0 0 0 0  Number falls in past yr: 0 0 0 0  Injury with Fall? 0 0 0 0  Risk for fall due to : No Fall Risks;Medication side effect  No Fall Risks No Fall Risks  Follow up Falls prevention discussed;Education provided;Falls evaluation completed  Falls evaluation completed Falls evaluation completed    FALL RISK PREVENTION PERTAINING TO THE HOME:  Any stairs in or around the home? Yes  If so, are there any without handrails? No  Home free of loose throw rugs in walkways, pet beds, electrical cords, etc? Yes  Adequate lighting in your home to reduce risk of falls? Yes   ASSISTIVE DEVICES UTILIZED TO PREVENT FALLS:  Life alert? No  Use of a cane, walker or w/c? No  Grab bars in the bathroom? Yes  Shower chair or bench in shower? Yes  Elevated toilet seat or a handicapped toilet? No   TIMED UP AND GO:  Was the test performed? No .      Cognitive Function:        05/14/2022    8:49 AM  6CIT Screen  What Year? 0 points  What month? 0 points  What time? 0 points  Count back from 20 0 points  Months in reverse 0 points  Repeat phrase 4 points  Total Score 4 points    Immunizations Immunization History  Administered Date(s) Administered   Hep A / Hep B 08/25/2016, 09/24/2016, 02/25/2017   Influenza Split 02/17/2008, 02/05/2010, 04/02/2011, 03/10/2012, 01/28/2013   Influenza,inj,Quad PF,6+ Mos 02/14/2016, 03/05/2017, 05/16/2020   Influenza,inj,quad, With Preservative 12/27/2013   Influenza-Unspecified 02/13/2016   PFIZER(Purple Top)SARS-COV-2 Vaccination  07/27/2019, 08/17/2019, 04/17/2020   Pneumococcal Polysaccharide-23 12/09/2017   Td 12/05/2002   Tdap 09/01/2016,  12/22/2019    TDAP status: Up to date  Flu Vaccine status: Due, Education has been provided regarding the importance of this vaccine. Advised may receive this vaccine at local pharmacy or Health Dept. Aware to provide a copy of the vaccination record if obtained from local pharmacy or Health Dept. Verbalized acceptance and understanding.  Pneumococcal vaccine status: Up to date  Covid-19 vaccine status: Completed vaccines  Qualifies for Shingles Vaccine? No   Zostavax completed  n/a   Shingrix Completed?: n/a  Screening Tests Health Maintenance  Topic Date Due   INFLUENZA VACCINE  11/26/2021   COVID-19 Vaccine (4 - 2023-24 season) 12/27/2021   DTaP/Tdap/Td (4 - Td or Tdap) 12/21/2029   Hepatitis C Screening  Completed   HIV Screening  Completed   HPV VACCINES  Aged Out    Health Maintenance  Health Maintenance Due  Topic Date Due   INFLUENZA VACCINE  11/26/2021   COVID-19 Vaccine (4 - 2023-24 season) 12/27/2021    Colorectal cancer screening: No longer required.   Lung Cancer Screening: (Low Dose CT Chest recommended if Age 75-80 years, 30 pack-year currently smoking OR have quit w/in 15years.) does not qualify.   Lung Cancer Screening Referral: no  Additional Screening:  Hepatitis C Screening: does qualify; Completed 11/15/2018  Vision Screening: Recommended annual ophthalmology exams for early detection of glaucoma and other disorders of the eye. Is the patient up to date with their annual eye exam?  Yes  Who is the provider or what is the name of the office in which the patient attends annual eye exams? [facility] If pt is not established with a provider, would they like to be referred to a provider to establish care? No .   Dental Screening: Recommended annual dental exams for proper oral hygiene  Community Resource Referral / Chronic Care Management: CRR required this visit?  No   CCM required this visit?  No      Plan:     I have personally  reviewed and noted the following in the patient's chart:   Medical and social history Use of alcohol, tobacco or illicit drugs  Current medications and supplements including opioid prescriptions. Patient is not currently taking opioid prescriptions. Functional ability and status Nutritional status Physical activity Advanced directives List of other physicians Hospitalizations, surgeries, and ER visits in previous 12 months Vitals Screenings to include cognitive, depression, and falls Referrals and appointments  In addition, I have reviewed and discussed with patient certain preventive protocols, quality metrics, and best practice recommendations. A written personalized care plan for preventive services as well as general preventive health recommendations were provided to patient.     Barb Merino, LPN   [id number]   Nurse Notes: none  Due to this being a virtual visit, the after visit summary with patients personalized plan was offered to patient via mail or my-chart.  Patient would like to access on my-chart

## 2022-05-14 NOTE — Patient Instructions (Signed)
[name] , Thank you for taking time to come for your Medicare Wellness Visit. I appreciate your ongoing commitment to your health goals. Please review the following plan we discussed and let me know if I can assist you in the future.   These are the goals we discussed:  Goals      Patient Stated     05/14/2022, wants to have abs        This is a list of the screening recommended for you and due dates:  Health Maintenance  Topic Date Due   Flu Shot  11/26/2021   COVID-19 Vaccine (4 - 2023-24 season) 12/27/2021   DTaP/Tdap/Td vaccine (4 - Td or Tdap) 12/21/2029   Hepatitis C Screening: USPSTF Recommendation to screen - Ages 75-79 yo.  Completed   HIV Screening  Completed   HPV Vaccine  Aged Out    Advanced directives: Advance directive discussed with you today.   Conditions/risks identified: none  Next appointment: Follow up in one year for your annual wellness visit   Preventive Care 40-64 Years, Male Preventive care refers to lifestyle choices and visits with your health care provider that can promote health and wellness. What does preventive care include? A yearly physical exam. This is also called an annual well check. Dental exams once or twice a year. Routine eye exams. Ask your health care provider how often you should have your eyes checked. Personal lifestyle choices, including: Daily care of your teeth and gums. Regular physical activity. Eating a healthy diet. Avoiding tobacco and drug use. Limiting alcohol use. Practicing safe sex. Taking low-dose aspirin every day starting at age 75. What happens during an annual well check? The services and screenings done by your health care provider during your annual well check will depend on your age, overall health, lifestyle risk factors, and family history of disease. Counseling  Your health care provider may ask you questions about your: Alcohol use. Tobacco use. Drug use. Emotional well-being. Home and  relationship well-being. Sexual activity. Eating habits. Work and work Statistician. Screening  You may have the following tests or measurements: Height, weight, and BMI. Blood pressure. Lipid and cholesterol levels. These may be checked every 5 years, or more frequently if you are over 54 years old. Skin check. Lung cancer screening. You may have this screening every year starting at age 58 if you have a 30-pack-year history of smoking and currently smoke or have quit within the past 15 years. Fecal occult blood test (FOBT) of the stool. You may have this test every year starting at age 68. Flexible sigmoidoscopy or colonoscopy. You may have a sigmoidoscopy every 5 years or a colonoscopy every 10 years starting at age 24. Prostate cancer screening. Recommendations will vary depending on your family history and other risks. Hepatitis C blood test. Hepatitis B blood test. Sexually transmitted disease (STD) testing. Diabetes screening. This is done by checking your blood sugar (glucose) after you have not eaten for a while (fasting). You may have this done every 1-3 years. Discuss your test results, treatment options, and if necessary, the need for more tests with your health care provider. Vaccines  Your health care provider may recommend certain vaccines, such as: Influenza vaccine. This is recommended every year. Tetanus, diphtheria, and acellular pertussis (Tdap, Td) vaccine. You may need a Td booster every 10 years. Zoster vaccine. You may need this after age 63. Pneumococcal 13-valent conjugate (PCV13) vaccine. You may need this if you have certain conditions and have  not been vaccinated. Pneumococcal polysaccharide (PPSV23) vaccine. You may need one or two doses if you smoke cigarettes or if you have certain conditions. Talk to your health care provider about which screenings and vaccines you need and how often you need them. This information is not intended to replace advice given to  you by your health care provider. Make sure you discuss any questions you have with your health care provider. Document Released: 05/11/2015 Document Revised: 01/02/2016 Document Reviewed: 02/13/2015 Elsevier Interactive Patient Education  2017 [employer].  Fall Prevention in the Home Falls can cause injuries. They can happen to people of all ages. There are many things you can do to make your home safe and to help prevent falls. What can I do on the outside of my home? Regularly fix the edges of walkways and driveways and fix any cracks. Remove anything that might make you trip as you walk through a door, such as a raised step or threshold. Trim any bushes or trees on the path to your home. Use bright outdoor lighting. Clear any walking paths of anything that might make someone trip, such as rocks or tools. Regularly check to see if handrails are loose or broken. Make sure that both sides of any steps have handrails. Any raised decks and porches should have guardrails on the edges. Have any leaves, snow, or ice cleared regularly. Use sand or salt on walking paths during winter. Clean up any spills in your garage right away. This includes oil or grease spills. What can I do in the bathroom? Use night lights. Install grab bars by the toilet and in the tub and shower. Do not use towel bars as grab bars. Use non-skid mats or decals in the tub or shower. If you need to sit down in the shower, use a plastic, non-slip stool. Keep the floor dry. Clean up any water that spills on the floor as soon as it happens. Remove soap buildup in the tub or shower regularly. Attach bath mats securely with double-sided non-slip rug tape. Do not have throw rugs and other things on the floor that can make you trip. What can I do in the bedroom? Use night lights. Make sure that you have a light by your bed that is easy to reach. Do not use any sheets or blankets that are too big for your bed. They should not  hang down onto the floor. Have a firm chair that has side arms. You can use this for support while you get dressed. Do not have throw rugs and other things on the floor that can make you trip. What can I do in the kitchen? Clean up any spills right away. Avoid walking on wet floors. Keep items that you use a lot in easy-to-reach places. If you need to reach something above you, use a strong step stool that has a grab bar. Keep electrical cords out of the way. Do not use floor polish or wax that makes floors slippery. If you must use wax, use non-skid floor wax. Do not have throw rugs and other things on the floor that can make you trip. What can I do with my stairs? Do not leave any items on the stairs. Make sure that there are handrails on both sides of the stairs and use them. Fix handrails that are broken or loose. Make sure that handrails are as long as the stairways. Check any carpeting to make sure that it is firmly attached to the stairs. Fix  any carpet that is loose or worn. Avoid having throw rugs at the top or bottom of the stairs. If you do have throw rugs, attach them to the floor with carpet tape. Make sure that you have a light switch at the top of the stairs and the bottom of the stairs. If you do not have them, ask someone to add them for you. What else can I do to help prevent falls? Wear shoes that: Do not have high heels. Have rubber bottoms. Are comfortable and fit you well. Are closed at the toe. Do not wear sandals. If you use a stepladder: Make sure that it is fully opened. Do not climb a closed stepladder. Make sure that both sides of the stepladder are locked into place. Ask someone to hold it for you, if possible. Clearly mark and make sure that you can see: Any grab bars or handrails. First and last steps. Where the edge of each step is. Use tools that help you move around (mobility aids) if they are needed. These  include: Canes. Walkers. Scooters. Crutches. Turn on the lights when you go into a dark area. Replace any light bulbs as soon as they burn out. Set up your furniture so you have a clear path. Avoid moving your furniture around. If any of your floors are uneven, fix them. If there are any pets around you, be aware of where they are. Review your medicines with your doctor. Some medicines can make you feel dizzy. This can increase your chance of falling. Ask your doctor what other things that you can do to help prevent falls. This information is not intended to replace advice given to you by your health care provider. Make sure you discuss any questions you have with your health care provider. Document Released: 02/08/2009 Document Revised: 09/20/2015 Document Reviewed: 05/19/2014 Elsevier Interactive Patient Education  2017 [employer].

## 2022-06-03 ENCOUNTER — Other Ambulatory Visit: Payer: Self-pay

## 2022-06-03 DIAGNOSIS — I8500 Esophageal varices without bleeding: Secondary | ICD-10-CM

## 2022-06-03 DIAGNOSIS — K7030 Alcoholic cirrhosis of liver without ascites: Secondary | ICD-10-CM

## 2022-06-03 DIAGNOSIS — K7682 Hepatic encephalopathy: Secondary | ICD-10-CM

## 2022-06-03 NOTE — Progress Notes (Signed)
MyChart message from patient that he is available to have EGD at [facility] on 3-19. Patient scheduled for EGD at [facility] on March 19th at 7:30 am to arrive at 6:00am. [id number]. Instructions to be mailed to patient closed to procedure date.

## 2022-06-06 MED ORDER — NADOLOL 20 MG PO TABS: ORAL_TABLET | Freq: Every day | ORAL | 0 refills | Status: DC

## 2022-06-30 ENCOUNTER — Telehealth: Payer: Self-pay

## 2022-06-30 DIAGNOSIS — K7030 Alcoholic cirrhosis of liver without ascites: Secondary | ICD-10-CM

## 2022-06-30 NOTE — Telephone Encounter (Signed)
RUQ [place] order in epic. Secure staff message sent to radiology scheduling to contact patient to set up appt. MyChart message sent to patient with RUQ [place] reminder.

## 2022-06-30 NOTE — Telephone Encounter (Signed)
-----   Message from Roetta Sessions, [place] sent at 06/03/2022  4:51 PM EST ----- Regarding: mail EGD instructions Mail EGD instructions to patient for Egd at [facility] on Tuesday, 3-19th at 7:30am to arr at 6:00am.  Amb ref is done

## 2022-06-30 NOTE — Telephone Encounter (Signed)
-----   Message from Yevette Edwards, RN sent at 02/18/2022  8:38 AM EDT ----- Regarding: RUQ [place] RUQ [place] - cirrhosis, [place] screening  Need to enter order

## 2022-06-30 NOTE — Telephone Encounter (Signed)
Instructions for procedure mailed to patient and sent via [place].

## 2022-07-02 NOTE — Telephone Encounter (Signed)
RUQ [place] is scheduled for Friday, 07/04/22 at 8 am.

## 2022-07-04 ENCOUNTER — Ambulatory Visit (HOSPITAL_COMMUNITY)
Admission: RE | Admit: 2022-07-04 | Discharge: 2022-07-04 | Disposition: A | Discharge status: Home / Self Care | Payer: Medicare Other | Source: Ambulatory Visit | Attending: Gastroenterology | Admitting: Gastroenterology

## 2022-07-04 DIAGNOSIS — K7030 Alcoholic cirrhosis of liver without ascites: Secondary | ICD-10-CM | POA: Diagnosis present

## 2022-07-08 ENCOUNTER — Encounter (HOSPITAL_COMMUNITY): Payer: Self-pay | Admitting: Gastroenterology

## 2022-07-15 ENCOUNTER — Ambulatory Visit (HOSPITAL_COMMUNITY)
Admission: RE | Admit: 2022-07-15 | Discharge: 2022-07-15 | Disposition: A | Discharge status: Home / Self Care | Payer: Medicare Other | Attending: Gastroenterology | Admitting: Gastroenterology

## 2022-07-15 ENCOUNTER — Encounter (HOSPITAL_COMMUNITY): Payer: Self-pay | Admitting: Gastroenterology

## 2022-07-15 ENCOUNTER — Encounter (HOSPITAL_COMMUNITY)
Admission: RE | Disposition: A | Discharge status: Home / Self Care | Payer: Self-pay | Source: Home / Self Care | Attending: Gastroenterology

## 2022-07-15 ENCOUNTER — Ambulatory Visit (HOSPITAL_COMMUNITY): Payer: Medicare Other | Admitting: Anesthesiology

## 2022-07-15 ENCOUNTER — Ambulatory Visit (HOSPITAL_BASED_OUTPATIENT_CLINIC_OR_DEPARTMENT_OTHER): Payer: Medicare Other | Admitting: Anesthesiology

## 2022-07-15 ENCOUNTER — Other Ambulatory Visit: Payer: Self-pay

## 2022-07-15 DIAGNOSIS — K7030 Alcoholic cirrhosis of liver without ascites: Secondary | ICD-10-CM

## 2022-07-15 DIAGNOSIS — I8510 Secondary esophageal varices without bleeding: Secondary | ICD-10-CM

## 2022-07-15 DIAGNOSIS — K3189 Other diseases of stomach and duodenum: Secondary | ICD-10-CM | POA: Diagnosis not present

## 2022-07-15 DIAGNOSIS — F32A Depression, unspecified: Secondary | ICD-10-CM | POA: Diagnosis not present

## 2022-07-15 DIAGNOSIS — K219 Gastro-esophageal reflux disease without esophagitis: Secondary | ICD-10-CM | POA: Diagnosis not present

## 2022-07-15 DIAGNOSIS — K7460 Unspecified cirrhosis of liver: Secondary | ICD-10-CM

## 2022-07-15 DIAGNOSIS — F419 Anxiety disorder, unspecified: Secondary | ICD-10-CM | POA: Diagnosis not present

## 2022-07-15 DIAGNOSIS — Z87891 Personal history of nicotine dependence: Secondary | ICD-10-CM | POA: Insufficient documentation

## 2022-07-15 DIAGNOSIS — K766 Portal hypertension: Secondary | ICD-10-CM | POA: Diagnosis not present

## 2022-07-15 DIAGNOSIS — K7682 Hepatic encephalopathy: Secondary | ICD-10-CM

## 2022-07-15 DIAGNOSIS — I8500 Esophageal varices without bleeding: Secondary | ICD-10-CM | POA: Diagnosis not present

## 2022-07-15 DIAGNOSIS — K449 Diaphragmatic hernia without obstruction or gangrene: Secondary | ICD-10-CM

## 2022-07-15 DIAGNOSIS — F418 Other specified anxiety disorders: Secondary | ICD-10-CM

## 2022-07-15 HISTORY — PX: ESOPHAGOGASTRODUODENOSCOPY (EGD) WITH PROPOFOL: SHX5813

## 2022-07-15 SURGERY — ESOPHAGOGASTRODUODENOSCOPY (EGD) WITH PROPOFOL
Anesthesia: Monitor Anesthesia Care

## 2022-07-15 MED ORDER — MIDAZOLAM HCL 5 MG/5ML IJ SOLN
INTRAMUSCULAR | Status: DC | PRN
  Administered 2022-07-15: 2 mg via INTRAVENOUS

## 2022-07-15 MED ORDER — PROPOFOL 500 MG/50ML IV EMUL
INTRAVENOUS | Status: DC | PRN
  Administered 2022-07-15: 100 ug/kg/min via INTRAVENOUS

## 2022-07-15 MED ORDER — MIDAZOLAM HCL 2 MG/2ML IJ SOLN
INTRAMUSCULAR | Status: AC
  Filled 2022-07-15: qty 2

## 2022-07-15 MED ORDER — PROPOFOL 10 MG/ML IV BOLUS
INTRAVENOUS | Status: DC | PRN
  Administered 2022-07-15: 60 mg via INTRAVENOUS

## 2022-07-15 MED ORDER — LIDOCAINE HCL (PF) 2 % IJ SOLN
INTRAMUSCULAR | Status: DC | PRN
  Administered 2022-07-15: 60 mg via INTRADERMAL

## 2022-07-15 MED ORDER — LACTATED RINGERS IV SOLN: INTRAVENOUS | Status: DC

## 2022-07-15 MED ORDER — SODIUM CHLORIDE 0.9 % IV SOLN: INTRAVENOUS | Status: DC

## 2022-07-15 SURGICAL SUPPLY — 15 items

## 2022-07-15 NOTE — Discharge Instructions (Signed)
YOU HAD AN ENDOSCOPIC PROCEDURE TODAY: Refer to the procedure report and other information in the discharge instructions given to you for any specific questions about what was found during the examination. If this information does not answer your questions, please call [place] office at [phone] to clarify.  ° °YOU SHOULD EXPECT: Some feelings of bloating in the abdomen. Passage of more gas than usual. Walking can help get rid of the air that was put into your GI tract during the procedure and reduce the bloating. If you had a lower endoscopy (such as a colonoscopy or flexible sigmoidoscopy) you may notice spotting of blood in your stool or on the toilet paper. Some abdominal soreness may be present for a day or two, also. ° °DIET: Your first meal following the procedure should be a light meal and then it is ok to progress to your normal diet. A half-sandwich or bowl of soup is an example of a good first meal. Heavy or fried foods are harder to digest and may make you feel nauseous or bloated. Drink plenty of fluids but you should avoid alcoholic beverages for 24 hours. If you had a esophageal dilation, please see attached instructions for diet.   ° °ACTIVITY: Your care partner should take you home directly after the procedure. You should plan to take it easy, moving slowly for the rest of the day. You can resume normal activity the day after the procedure however YOU SHOULD NOT DRIVE, use power tools, machinery or perform tasks that involve climbing or major physical exertion for 24 hours (because of the sedation medicines used during the test).  ° °SYMPTOMS TO REPORT IMMEDIATELY: °A gastroenterologist can be reached at any hour. Please call [phone]  for any of the following symptoms:  °Following lower endoscopy (colonoscopy, flexible sigmoidoscopy) °Excessive amounts of blood in the stool  °Significant tenderness, worsening of abdominal pains  °Swelling of the abdomen that is new, acute  °Fever of 100° or  higher  °Following upper endoscopy (EGD, EUS, ERCP, esophageal dilation) °Vomiting of blood or coffee ground material  °New, significant abdominal pain  °New, significant chest pain or pain under the shoulder blades  °Painful or persistently difficult swallowing  °New shortness of breath  °Black, tarry-looking or red, bloody stools ° °FOLLOW UP:  °If any biopsies were taken you will be contacted by phone or by letter within the next 1-3 weeks. Call [phone]  if you have not heard about the biopsies in 3 weeks.  °Please also call with any specific questions about appointments or follow up tests. ° °

## 2022-07-15 NOTE — Anesthesia Postprocedure Evaluation (Signed)
Anesthesia Post Note  Patient: [name] [facility]  Procedure(s) Performed: ESOPHAGOGASTRODUODENOSCOPY (EGD) WITH PROPOFOL ESOPHAGEAL BANDING     Patient location during evaluation: Endoscopy Anesthesia Type: MAC Level of consciousness: awake and alert Pain management: pain level controlled Vital Signs Assessment: post-procedure vital signs reviewed and stable Respiratory status: spontaneous breathing, nonlabored ventilation, respiratory function stable and patient connected to nasal cannula oxygen Cardiovascular status: blood pressure returned to baseline and stable Postop Assessment: no apparent nausea or vomiting Anesthetic complications: no  No notable events documented.  Last Vitals:  Vitals:   07/15/22 0817 07/15/22 0830  BP: 96/63 131/79  Pulse: 62 73  Resp: 16 13  Temp: 36.8 C   SpO2: 100% 98%    Last Pain:  Vitals:   07/15/22 0830  TempSrc:   PainSc: 0-No pain                 [name]

## 2022-07-15 NOTE — H&P (Signed)
[facility] History and Physical   Primary Care Physician:  Camillia Herter, NP   Reason for Procedure:   Esophageal varices  Plan:    EGD     HPI: [name] is a 42 y.o. male  here for EGD to survey esophageal varices - history of bleeding in the past with multiple prior bandings, has not required banding in the past year and clinically doing well. Off alcohol. Otherwise feels well today without complaints.  On nadolol daily. Last EGD 12/2021.   I have discussed risks / benefits of anesthesia and endoscopic procedure with [name] and they wish to proceed with the exams as outlined today.    Past Medical History:  Diagnosis Date   Anxiety    Phreesia 05/14/2020   Blood transfusion without reported diagnosis    Phreesia 05/14/2020   Cirrhosis of liver ([place])    Depression    Phreesia 05/14/2020   Esophageal varices with hemorrhage ([facility])    ETOH abuse    GERD (gastroesophageal reflux disease)    Seizures ([place])    Phreesia 05/14/2020   Substance abuse ([place])    Phreesia 05/14/2020    Past Surgical History:  Procedure Laterality Date   ESOPHAGEAL BANDING  08/30/2019   Procedure: ESOPHAGEAL BANDING;  Surgeon: Ronnette Juniper, MD;  Location: [place];  Service: Gastroenterology;;   ESOPHAGEAL BANDING  10/10/2019   Procedure: ESOPHAGEAL BANDING;  Surgeon: Ronnette Juniper, MD;  Location: [place];  Service: Gastroenterology;;   ESOPHAGEAL BANDING N/A 12/23/2019   Procedure: ESOPHAGEAL BANDING;  Surgeon: Otis Brace, MD;  Location: [facility];  Service: Gastroenterology;  Laterality: N/A;   ESOPHAGEAL BANDING N/A 01/12/2020   Procedure: ESOPHAGEAL BANDING;  Surgeon: Otis Brace, MD;  Location: [facility];  Service: Gastroenterology;  Laterality: N/A;   ESOPHAGEAL BANDING N/A 09/10/2020   Procedure: ESOPHAGEAL BANDING;  Surgeon: Yetta Flock, MD;  Location: [facility];  Service: Gastroenterology;  Laterality: N/A;   ESOPHAGEAL  BANDING  04/16/2021   Procedure: ESOPHAGEAL BANDING;  Surgeon: Sharyn Creamer, MD;  Location: [facility];  Service: Gastroenterology;;   ESOPHAGEAL BANDING N/A 05/21/2021   Procedure: ESOPHAGEAL BANDING;  Surgeon: Milus Banister, MD;  Location: [facility];  Service: Endoscopy;  Laterality: N/A;   ESOPHAGOGASTRODUODENOSCOPY Left 08/21/2015   Procedure: ESOPHAGOGASTRODUODENOSCOPY (EGD);  Surgeon: Wilford Corner, MD;  Location: [facility];  Service: Endoscopy;  Laterality: Left;   ESOPHAGOGASTRODUODENOSCOPY N/A 12/05/2019   Procedure: ESOPHAGOGASTRODUODENOSCOPY (EGD);  Surgeon: Otis Brace, MD;  Location: [facility];  Service: Gastroenterology;  Laterality: N/A;   ESOPHAGOGASTRODUODENOSCOPY (EGD) WITH PROPOFOL N/A 08/30/2019   Procedure: ESOPHAGOGASTRODUODENOSCOPY (EGD) WITH PROPOFOL;  Surgeon: Ronnette Juniper, MD;  Location: [place];  Service: Gastroenterology;  Laterality: N/A;   ESOPHAGOGASTRODUODENOSCOPY (EGD) WITH PROPOFOL N/A 10/10/2019   Procedure: ESOPHAGOGASTRODUODENOSCOPY (EGD) WITH PROPOFOL;  Surgeon: Ronnette Juniper, MD;  Location: [place];  Service: Gastroenterology;  Laterality: N/A;   ESOPHAGOGASTRODUODENOSCOPY (EGD) WITH PROPOFOL N/A 12/23/2019   Procedure: ESOPHAGOGASTRODUODENOSCOPY (EGD) WITH PROPOFOL;  Surgeon: Otis Brace, MD;  Location: [facility];  Service: Gastroenterology;  Laterality: N/A;   ESOPHAGOGASTRODUODENOSCOPY (EGD) WITH PROPOFOL N/A 01/12/2020   Procedure: ESOPHAGOGASTRODUODENOSCOPY (EGD) WITH PROPOFOL;  Surgeon: Otis Brace, MD;  Location: [facility];  Service: Gastroenterology;  Laterality: N/A;   ESOPHAGOGASTRODUODENOSCOPY (EGD) WITH PROPOFOL N/A 09/10/2020   Procedure: ESOPHAGOGASTRODUODENOSCOPY (EGD) WITH PROPOFOL;  Surgeon: Yetta Flock, MD;  Location: [facility];  Service: Gastroenterology;  Laterality: N/A;   ESOPHAGOGASTRODUODENOSCOPY (EGD) WITH PROPOFOL N/A 04/16/2021   Procedure: ESOPHAGOGASTRODUODENOSCOPY (  EGD) WITH  PROPOFOL;  Surgeon: Sharyn Creamer, MD;  Location: [facility];  Service: Gastroenterology;  Laterality: N/A;   ESOPHAGOGASTRODUODENOSCOPY (EGD) WITH PROPOFOL N/A 05/21/2021   Procedure: ESOPHAGOGASTRODUODENOSCOPY (EGD) WITH PROPOFOL;  Surgeon: Milus Banister, MD;  Location: [facility];  Service: Endoscopy;  Laterality: N/A;   ESOPHAGOGASTRODUODENOSCOPY (EGD) WITH PROPOFOL N/A 08/01/2021   Procedure: ESOPHAGOGASTRODUODENOSCOPY (EGD) WITH PROPOFOL;  Surgeon: Yetta Flock, MD;  Location: [facility];  Service: Gastroenterology;  Laterality: N/A;   ESOPHAGOGASTRODUODENOSCOPY (EGD) WITH PROPOFOL N/A 01/01/2022   Procedure: ESOPHAGOGASTRODUODENOSCOPY (EGD) WITH PROPOFOL;  Surgeon: Ladene Artist, MD;  Location: [facility];  Service: Gastroenterology;  Laterality: N/A;   GASTRIC VARICES BANDING  12/05/2019   Procedure: GASTRIC VARICES BANDING;  Surgeon: Otis Brace, MD;  Location: [place];  Service: Gastroenterology;;  placed 6 bands   HOT HEMOSTASIS N/A 12/05/2019   Procedure: HOT HEMOSTASIS (ARGON PLASMA COAGULATION/BICAP);  Surgeon: Otis Brace, MD;  Location: [facility];  Service: Gastroenterology;  Laterality: N/A;   IR RADIOLOGIST EVAL & MGMT  12/13/2019    Prior to Admission medications   Medication Sig Start Date End Date Taking? Authorizing Provider  Ferrous Sulfate (IRON) 325 (65 Fe) MG TABS Take 1 tablet (325 mg total) by mouth daily. Patient taking differently: Take 325 mg by mouth daily with breakfast. 09/02/19  Yes Regalado, Belkys A, MD  folic acid (FOLVITE) A999333 MCG tablet Take 400 mcg by mouth daily.   Yes [provider]  lactulose (CHRONULAC) 10 GM/15ML solution Take 22.5 mLs (15 g total) by mouth in the morning and at bedtime. 04/14/22  Yes Remmi Armenteros, Carlota Raspberry, MD  Magnesium 500 MG TABS Take 500 mg by mouth in the morning and at bedtime.   Yes [provider]  Multiple Vitamin (MULTIVITAMIN WITH MINERALS) TABS tablet Take 1 tablet by mouth  daily. Patient taking differently: Take 1 tablet by mouth daily with breakfast. Men 07/19/19  Yes Johnson, Clanford L, MD  nadolol (CORGARD) 20 MG tablet TAKE 1 TABLET (20 MG TOTAL) BY MOUTH DAILY. 06/06/22  Yes Electra Paladino, Carlota Raspberry, MD  pantoprazole (PROTONIX) 40 MG tablet Take 1 tablet (40 mg total) by mouth 2 (two) times daily before a meal. 04/07/22  Yes Coralee Edberg, Carlota Raspberry, MD  sertraline (ZOLOFT) 100 MG tablet Take 1 tablet (100 mg total) by mouth daily. 05/08/22  Yes Minette Brine, Amy J, NP  thiamine (VITAMIN B-1) 100 MG tablet Take 100 mg by mouth daily.   Yes [provider]  hydrocortisone cream 1 % Apply 1 application topically daily as needed for itching.    [provider]  hydrOXYzine (VISTARIL) 25 MG capsule TAKE 1 CAPSULE(25 MG) BY MOUTH EVERY 8 HOURS AS NEEDED Patient not taking: Reported on 07/14/2022 04/11/22   Camillia Herter, NP  oxymetazoline (AFRIN) 0.05 % nasal spray Place 1 spray into both nostrils 2 (two) times daily as needed for congestion.    [provider]    Current Facility-Administered Medications  Medication Dose Route Frequency Provider Last Rate Last Admin   0.9 %  sodium chloride infusion   Intravenous Continuous Kerria Sapien, Carlota Raspberry, MD       lactated ringers infusion   Intravenous Continuous Ewing Fandino, Carlota Raspberry, MD 50 mL/hr at 07/15/22 0758 Continued from Pre-op at 07/15/22 0758   Facility-Administered Medications Ordered in Other Encounters  Medication Dose Route Frequency Provider Last Rate Last Admin   midazolam (VERSED) 5 MG/5ML injection   Intravenous Anesthesia Intra-op Randye Lobo, CRNA  2 mg at 07/15/22 0757    Allergies as of 06/03/2022 - Review Complete 05/14/2022  Allergen Reaction Noted   Penicillins Hives and Other (See Comments) 05/26/2018    Family History  Problem Relation Age of Onset   Other Mother        pre-diabetes   COPD Father        smoker   Meniere's disease Father    Stroke Maternal  Grandmother    Heart attack Maternal Grandfather    Stroke Paternal Grandfather    Crohn's disease Maternal Uncle    Heart attack Maternal Uncle    Peptic Ulcer Paternal Aunt     Social History   Socioeconomic History   Marital status: Single    Spouse name: Not on file   Number of children: 0   Years of education: Not on file   Highest education level: Not on file  Occupational History   Not on file  Tobacco Use   Smoking status: Former    Packs/day: 1    Types: Cigarettes   Smokeless tobacco: Never  Vaping Use   Vaping Use: Never used  Substance and Sexual Activity   Alcohol use: Not Currently    Alcohol/week: 3.0 standard drinks of alcohol    Types: 3 Standard drinks or equivalent per week    Comment: Alcoholism   Drug use: Yes    Frequency: 7.0 times per week    Types: Marijuana   Sexual activity: Not Currently  Other Topics Concern   Not on file  Social History Narrative   Not on file   Social Determinants of Health   Financial Resource Strain: Low Risk  (05/14/2022)   Overall Financial Resource Strain (CARDIA)    Difficulty of Paying Living Expenses: Not hard at all  Food Insecurity: No Food Insecurity (05/14/2022)   Hunger Vital Sign    Worried About Running Out of Food in the Last Year: Never true    Ran Out of Food in the Last Year: Never true  Transportation Needs: No Transportation Needs (05/14/2022)   PRAPARE - Hydrologist (Medical): No    Lack of Transportation (Non-Medical): No  Physical Activity: Sufficiently Active (05/14/2022)   Exercise Vital Sign    Days of Exercise per Week: 7 days    Minutes of Exercise per Session: 60 min  Stress: Stress Concern Present (05/14/2022)   [place]    Feeling of Stress : To some extent  Social Connections: Not on file  Intimate Partner Violence: Not on file    Review of Systems: All other review of systems  negative except as mentioned in the HPI.  Physical Exam: Vital signs BP 118/78   Pulse 64   Resp 10   Ht 5\' 7"  (1.702 m)   Wt 65.8 kg   SpO2 99%   BMI 22.71 kg/m   General:   Alert,  Well-developed, pleasant and cooperative in NAD Lungs:  Clear throughout to auscultation.   Heart:  Regular rate and rhythm Abdomen:  Soft, nontender and nondistended.   Neuro/Psych:  Alert and cooperative. Normal mood and affect. A and O x 3  Jolly Mango, MD [facility]

## 2022-07-15 NOTE — Op Note (Signed)
[facility] Patient Name: [name] Procedure Date: 07/15/2022 MRN: [id number] Attending MD: Carlota Raspberry. Havery Moros , MD, [id number] Date of Birth: 09/08/1980 CSN: [id number] Age: 42 Admit Type: Outpatient Procedure:                Upper GI endoscopy Indications:              history of esophageal varices with bleeding - s/p                            band ligation in the past, on nadolol. EGDs over                            the past year have looked okay with small varices                            only, no further banding in 2023, ,last exam                            12/2021. Clinically doing well Providers:                Remo Lipps P. Havery Moros, MD, Burtis Junes, RN, Darliss Cheney, Technician Referring MD:              Medicines:                Monitored Anesthesia Care Complications:            No immediate complications. Estimated blood loss:                            None. Estimated Blood Loss:     Estimated blood loss: none. Procedure:                Pre-Anesthesia Assessment:                           - Prior to the procedure, a History and Physical                            was performed, and patient medications and                            allergies were reviewed. The patient's tolerance of                            previous anesthesia was also reviewed. The risks                            and benefits of the procedure and the sedation                            options and risks were discussed with the patient.  All questions were answered, and informed consent                            was obtained. Prior Anticoagulants: The patient has                            taken no anticoagulant or antiplatelet agents. ASA                            Grade Assessment: III - A patient with severe                            systemic disease. After reviewing the risks and                            benefits, the  patient was deemed in satisfactory                            condition to undergo the procedure.                           After obtaining informed consent, the endoscope was                            passed under direct vision. Throughout the                            procedure, the patient's blood pressure, pulse, and                            oxygen saturations were monitored continuously. The                            [id number] Olympus endoscope was introduced                            through the mouth, and advanced to the second part                            of duodenum. The upper GI endoscopy was                            accomplished without difficulty. The patient                            tolerated the procedure well. Scope In: Scope Out: Findings:      Esophagogastric landmarks were identified: the Z-line was found at 38       cm, the gastroesophageal junction was found at 38 cm and the upper       extent of the gastric folds was found at 39 cm from the incisors.      A 1 cm hiatal hernia was present.      Grade I varices were found in the middle third of the esophagus and in  the lower third of the esophagus. They were small in size and flattened       with insufflation - no high risk stigmata for bleeding noted.      The exam of the esophagus was otherwise normal.      Mild portal hypertensive gastropathy was found in the entire examined       stomach.      The exam of the stomach was otherwise normal.      The examined duodenum was normal. Impression:               - Esophagogastric landmarks identified.                           - 1 cm hiatal hernia.                           - Grade I / small esophageal varices without high                            risk stigmata for bleeding - stable over the past                            year.                           - Portal hypertensive gastropathy.                           - Normal examined  duodenum. Moderate Sedation:      No moderate sedation, case performed with MAC Recommendation:           - Patient has a contact number available for                            emergencies. The signs and symptoms of potential                            delayed complications were discussed with the                            patient. Return to normal activities tomorrow.                            Written discharge instructions were provided to the                            patient.                           - Resume previous diet.                           - Continue present medications.                           - Repeat upper endoscopy in 1 year for surveillance  of varices. Procedure Code(s):        --- Professional ---                           [phone], Esophagogastroduodenoscopy, flexible,                            transoral; diagnostic, including collection of                            specimen(s) by brushing or washing, when performed                            (separate procedure) Diagnosis Code(s):        --- Professional ---                           K44.9, Diaphragmatic hernia without obstruction or                            gangrene                           I85.00, Esophageal varices without bleeding                           K76.6, Portal hypertension                           K31.89, Other diseases of stomach and duodenum CPT copyright 2022 American Medical Association. All rights reserved. The codes documented in this report are preliminary and upon coder review may  be revised to meet current compliance requirements. Remo Lipps P. Tima Curet, MD 07/15/2022 8:15:55 AM This report has been signed electronically. Number of Addenda: 0

## 2022-07-15 NOTE — Transfer of Care (Signed)
Immediate Anesthesia Transfer of Care Note  Patient: [name] [facility]  Procedure(s) Performed: ESOPHAGOGASTRODUODENOSCOPY (EGD) WITH PROPOFOL ESOPHAGEAL BANDING  Patient Location: PACU and Endoscopy Unit  Anesthesia Type:MAC  Level of Consciousness: awake, alert , oriented, sedated, and patient cooperative  Airway & Oxygen Therapy: Patient Spontanous Breathing  Post-op Assessment: Report given to RN, Post -op Vital signs reviewed and stable, and Patient moving all extremities  Post vital signs: Reviewed and stable  Last Vitals:  Vitals Value Taken Time  BP    Temp    Pulse    Resp    SpO2      Last Pain:  Vitals:   07/15/22 0637  TempSrc: Temporal  PainSc: 0-No pain         Complications: No notable events documented.

## 2022-07-15 NOTE — Anesthesia Preprocedure Evaluation (Addendum)
Anesthesia Evaluation  Patient identified by MRN, date of birth, ID band Patient awake    Reviewed: Allergy & Precautions, NPO status , Patient's Chart, lab work & pertinent test results  Airway Mallampati: II  TM Distance: >3 FB Neck ROM: Full    Dental  (+) Poor Dentition, Chipped, Dental Advisory Given,    Pulmonary former smoker   Pulmonary exam normal breath sounds clear to auscultation       Cardiovascular Normal cardiovascular exam Rhythm:Regular Rate:Normal  TTE 2017 Left ventricle: The cavity size was normal. Systolic function was    normal. The estimated ejection fraction was in the range of 55%    to 60%. Wall motion was normal; there were no regional wall    motion abnormalities. Left ventricular diastolic function    parameters were normal.  - Aortic valve: Transvalvular velocity was within the normal range.    There was no stenosis. There was no regurgitation.  - Mitral valve: Transvalvular velocity was within the normal range.    There was no evidence for stenosis. There was no regurgitation.  - Right ventricle: The cavity size was normal. Wall thickness was    normal. Systolic function was normal.  - Tricuspid valve: There was no regurgitation.     Neuro/Psych Seizures -,  PSYCHIATRIC DISORDERS Anxiety Depression       GI/Hepatic ,GERD  ,,(+) Cirrhosis   Esophageal Varices  substance abuse  alcohol use and marijuana use  Endo/Other  negative endocrine ROS    Renal/GU negative Renal ROS  negative genitourinary   Musculoskeletal negative musculoskeletal ROS (+)    Abdominal   Peds  Hematology negative hematology ROS (+)   Anesthesia Other Findings   Reproductive/Obstetrics                             Anesthesia Physical Anesthesia Plan  ASA: 3  Anesthesia Plan: MAC   Post-op Pain Management:    Induction: Intravenous  PONV Risk Score and Plan: Propofol infusion  and Treatment may vary due to age or medical condition  Airway Management Planned: Natural Airway  Additional Equipment:   Intra-op Plan:   Post-operative Plan:   Informed Consent: I have reviewed the patients History and Physical, chart, labs and discussed the procedure including the risks, benefits and alternatives for the proposed anesthesia with the patient or authorized representative who has indicated his/her understanding and acceptance.     Dental advisory given  Plan Discussed with: CRNA  Anesthesia Plan Comments:        Anesthesia Quick Evaluation

## 2022-08-13 ENCOUNTER — Telehealth: Payer: Self-pay

## 2022-08-13 DIAGNOSIS — K7030 Alcoholic cirrhosis of liver without ascites: Secondary | ICD-10-CM

## 2022-08-13 NOTE — Telephone Encounter (Signed)
-----   Message from Loretha Stapler, RN sent at 08/12/2022 11:50 AM EDT ----- Dr Adela Lank pt  ----- Message ----- From: Lucky Cowboy, RN Sent: 08/12/2022   8:00 AM EDT To: Lucky Cowboy, RN  Patient needs 6 month follow up AFP. Order needs to be placed. Refer to lab result note 02/05/22.

## 2022-08-13 NOTE — Telephone Encounter (Signed)
MyChart message sent to patient with lab reminder. AFP order in epic under Quentin Mulling, PA-C as she requested repeat in 11-months.

## 2022-08-15 NOTE — Telephone Encounter (Signed)
Patient reviewed MyChart message. Last read by [name] at  3:38 PM on 08/13/2022.

## 2022-08-21 ENCOUNTER — Other Ambulatory Visit: Payer: Medicare Other

## 2022-08-21 DIAGNOSIS — K7030 Alcoholic cirrhosis of liver without ascites: Secondary | ICD-10-CM

## 2022-08-21 DIAGNOSIS — R772 Abnormality of alphafetoprotein: Secondary | ICD-10-CM

## 2022-08-25 LAB — AFP TUMOR MARKER: AFP-Tumor Marker: 6.8 ng/mL — ABNORMAL HIGH (ref <6.1)

## 2022-09-04 ENCOUNTER — Other Ambulatory Visit: Payer: Self-pay

## 2022-09-04 MED ORDER — NADOLOL 20 MG PO TABS: 20.0000 mg | ORAL_TABLET | Freq: Every day | ORAL | 1 refills | Status: DC

## 2022-09-10 ENCOUNTER — Ambulatory Visit (HOSPITAL_COMMUNITY)
Admission: RE | Admit: 2022-09-10 | Discharge: 2022-09-10 | Disposition: A | Discharge status: Home / Self Care | Payer: Medicare Other | Source: Ambulatory Visit | Attending: Physician Assistant | Admitting: Physician Assistant

## 2022-09-10 DIAGNOSIS — K7030 Alcoholic cirrhosis of liver without ascites: Secondary | ICD-10-CM | POA: Diagnosis present

## 2022-09-10 DIAGNOSIS — R772 Abnormality of alphafetoprotein: Secondary | ICD-10-CM | POA: Insufficient documentation

## 2022-09-10 MED ORDER — GADOBUTROL 1 MMOL/ML IV SOLN
7.0000 mL | Freq: Once | INTRAVENOUS | Status: AC | PRN
  Administered 2022-09-10: 7 mL via INTRAVENOUS

## 2022-09-11 ENCOUNTER — Other Ambulatory Visit: Payer: Self-pay

## 2022-09-11 MED ORDER — LACTULOSE 10 GM/15ML PO SOLN: 15.0000 g | Freq: Two times a day (BID) | ORAL | 2 refills | Status: DC

## 2022-10-21 ENCOUNTER — Other Ambulatory Visit: Payer: Self-pay

## 2022-10-21 DIAGNOSIS — I8501 Esophageal varices with bleeding: Secondary | ICD-10-CM

## 2022-10-21 MED ORDER — PANTOPRAZOLE SODIUM 40 MG PO TBEC: 40.0000 mg | DELAYED_RELEASE_TABLET | Freq: Two times a day (BID) | ORAL | 5 refills | Status: DC

## 2022-11-21 ENCOUNTER — Encounter: Payer: Self-pay | Admitting: Family

## 2022-11-21 ENCOUNTER — Ambulatory Visit (INDEPENDENT_AMBULATORY_CARE_PROVIDER_SITE_OTHER): Payer: Medicare Other | Admitting: Family

## 2022-11-21 VITALS — BP 106/69 | HR 66 | Temp 97.7°F | Ht 68.0 in | Wt 133.8 lb

## 2022-11-21 DIAGNOSIS — F419 Anxiety disorder, unspecified: Secondary | ICD-10-CM

## 2022-11-21 DIAGNOSIS — F32A Depression, unspecified: Secondary | ICD-10-CM

## 2022-11-21 MED ORDER — SERTRALINE HCL 100 MG PO TABS: 100.0000 mg | ORAL_TABLET | Freq: Every day | ORAL | 0 refills | Status: DC

## 2022-11-21 NOTE — Progress Notes (Signed)
Pt needs refill on zoloft

## 2022-11-21 NOTE — Progress Notes (Signed)
Patient ID: [name], male    DOB: April 14, 1981  MRN: [id number]  CC: Anxiety Depression Follow-Up  Subjective: [name] is a 42 y.o. male who presents for anxiety depression follow-up.   His concerns today include:  Reports doing well on Sertraline, no issues/concerns. He denies thoughts of self-harm, suicidal ideations, homicidal ideations. No further issues/concerns for discussion today.   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   Esophageal varices with bleeding (HCC) 05/16/2020   Encephalopathy 05/16/2020   GI bleed 10/09/2019   Hematemesis 10/09/2019   Leukocytosis 10/09/2019   Elevated liver enzymes 10/09/2019   Elevated lactic acid level 09/02/2019   Bleeding esophageal varices (HCC) 09/02/2019   Shock (HCC) 08/30/2019   Electrolyte disturbance 07/16/2019   Lactic acidosis 07/16/2019   Hyperammonemia (HCC) 07/16/2019   Hyperbilirubinemia 07/16/2019   Alcoholic cirrhosis of liver without ascites (HCC) 07/24/2016   Acute encephalopathy    Acute upper GI bleed    Ascites    Distended abdomen    Hemorrhagic shock (HCC)    Encounter for palliative care    Goals of care, counseling/discussion    Encephalopathy acute 08/27/2015   Alcoholic cirrhosis (HCC)    Acute hypoxemic respiratory failure (HCC)    SBP (spontaneous bacterial peritonitis) (HCC)    Hypocalcemia 08/21/2015   Hypomagnesemia 08/21/2015   Acute blood loss anemia 08/20/2015   Alcohol abuse 08/20/2015    Elevated INR 08/20/2015   Hypoalbuminemia 08/20/2015   Hypokalemia 08/20/2015     Current Outpatient Medications on File Prior to Visit  Medication Sig Dispense Refill   Ferrous Sulfate (IRON) 325 (65 Fe) MG TABS Take 1 tablet (325 mg total) by mouth daily. (Patient taking differently: Take 325 mg by mouth daily with breakfast.) 20 tablet 0   folic acid (FOLVITE) 400 MCG tablet Take 400 mcg by mouth daily.     hydrocortisone cream 1 % Apply 1 application topically daily as needed for itching.     hydrOXYzine (VISTARIL) 25 MG capsule TAKE 1 CAPSULE(25 MG) BY MOUTH EVERY 8 HOURS AS NEEDED 30 capsule 2   lactulose (CHRONULAC) 10 GM/15ML solution Take 22.5 mLs (15 g total) by mouth in the morning and at bedtime. 1892 mL 2   Magnesium 500 MG TABS Take 500 mg by mouth in the morning and at bedtime.     Multiple Vitamin (MULTIVITAMIN WITH MINERALS) TABS tablet Take 1 tablet by mouth daily. (Patient taking differently: Take 1 tablet by mouth daily with breakfast. Men)     nadolol (CORGARD) 20 MG tablet Take 1 tablet (20 mg total) by mouth daily. 90 tablet 1   oxymetazoline (AFRIN) 0.05 % nasal spray Place 1 spray into both nostrils 2 (two) times daily as needed for congestion.     pantoprazole (PROTONIX) 40 MG tablet Take 1 tablet (40 mg total) by mouth 2 (two) times daily before a meal. 60 tablet 5   thiamine (VITAMIN B-1) 100 MG tablet Take 100 mg by mouth daily.     No current facility-administered medications on file prior  to visit.    Allergies  Allergen Reactions   Penicillins Hives and Other (See Comments)    Occurred when he was an infant    Social History   Socioeconomic History   Marital status: Single    Spouse name: Not on file   Number of children: 0   Years of education: Not on file   Highest education level: GED or equivalent  Occupational History   Not on file  Tobacco Use   Smoking status: Former    Current packs/day: 1.00    Types: Cigarettes   Smokeless tobacco:  Never  Vaping Use   Vaping status: Never Used  Substance and Sexual Activity   Alcohol use: Not Currently    Alcohol/week: 3.0 standard drinks of alcohol    Types: 3 Standard drinks or equivalent per week    Comment: Alcoholism   Drug use: Yes    Frequency: 7.0 times per week    Types: Marijuana   Sexual activity: Not Currently  Other Topics Concern   Not on file  Social History Narrative   Not on file   Social Determinants of Health   Financial Resource Strain: High Risk (11/20/2022)   Overall Financial Resource Strain (CARDIA)    Difficulty of Paying Living Expenses: Hard  Food Insecurity: Food Insecurity Present (11/20/2022)   Hunger Vital Sign    Worried About Running Out of Food in the Last Year: Sometimes true    Ran Out of Food in the Last Year: Never true  Transportation Needs: No Transportation Needs (11/20/2022)   PRAPARE - Administrator, Civil Service (Medical): No    Lack of Transportation (Non-Medical): No  Physical Activity: Sufficiently Active (11/20/2022)   Exercise Vital Sign    Days of Exercise per Week: 7 days    Minutes of Exercise per Session: 60 min  Stress: Stress Concern Present (11/20/2022)   [employer] of Occupational Health - Occupational Stress Questionnaire    Feeling of Stress : To some extent  Social Connections: Socially Isolated (11/20/2022)   Social Connection and Isolation Panel [NHANES]    Frequency of Communication with Friends and Family: Three times a week    Frequency of Social Gatherings with Friends and Family: Twice a week    Attends Religious Services: Never    Database administrator or Organizations: No    Attends Engineer, structural: Not on file    Marital Status: Never married  Catering manager Violence: Not on file    Family History  Problem Relation Age of Onset   Other Mother        pre-diabetes   COPD Father        smoker   Meniere's disease Father    Stroke Maternal Grandmother    Heart  attack Maternal Grandfather    Stroke Paternal Grandfather    Crohn's disease Maternal Uncle    Heart attack Maternal Uncle    Peptic Ulcer Paternal Aunt     Past Surgical History:  Procedure Laterality Date   ESOPHAGEAL BANDING  08/30/2019   Procedure: ESOPHAGEAL BANDING;  Surgeon: Kerin Salen, MD;  Location: [facility] ENDOSCOPY;  Service: Gastroenterology;;   ESOPHAGEAL BANDING  10/10/2019   Procedure: ESOPHAGEAL BANDING;  Surgeon: Kerin Salen, MD;  Location: [facility] ENDOSCOPY;  Service: Gastroenterology;;   ESOPHAGEAL BANDING N/A 12/23/2019   Procedure: ESOPHAGEAL BANDING;  Surgeon: Kathi Der, MD;  Location: [facility] ENDOSCOPY;  Service: Gastroenterology;  Laterality: N/A;   ESOPHAGEAL BANDING N/A 01/12/2020  Procedure: ESOPHAGEAL BANDING;  Surgeon: Kathi Der, MD;  Location: [facility] ENDOSCOPY;  Service: Gastroenterology;  Laterality: N/A;   ESOPHAGEAL BANDING N/A 09/10/2020   Procedure: ESOPHAGEAL BANDING;  Surgeon: Benancio Deeds, MD;  Location: [facility] ENDOSCOPY;  Service: Gastroenterology;  Laterality: N/A;   ESOPHAGEAL BANDING  04/16/2021   Procedure: ESOPHAGEAL BANDING;  Surgeon: Imogene Burn, MD;  Location: [facility] ENDOSCOPY;  Service: Gastroenterology;;   ESOPHAGEAL BANDING N/A 05/21/2021   Procedure: ESOPHAGEAL BANDING;  Surgeon: Rachael Fee, MD;  Location: [facility] ENDOSCOPY;  Service: Endoscopy;  Laterality: N/A;   ESOPHAGOGASTRODUODENOSCOPY Left 08/21/2015   Procedure: ESOPHAGOGASTRODUODENOSCOPY (EGD);  Surgeon: Charlott Rakes, MD;  Location: [facility] ENDOSCOPY;  Service: Endoscopy;  Laterality: Left;   ESOPHAGOGASTRODUODENOSCOPY N/A 12/05/2019   Procedure: ESOPHAGOGASTRODUODENOSCOPY (EGD);  Surgeon: Kathi Der, MD;  Location: [facility] ENDOSCOPY;  Service: Gastroenterology;  Laterality: N/A;   ESOPHAGOGASTRODUODENOSCOPY (EGD) WITH PROPOFOL N/A 08/30/2019   Procedure: ESOPHAGOGASTRODUODENOSCOPY (EGD) WITH PROPOFOL;  Surgeon: Kerin Salen, MD;  Location: [facility] ENDOSCOPY;  Service: Gastroenterology;   Laterality: N/A;   ESOPHAGOGASTRODUODENOSCOPY (EGD) WITH PROPOFOL N/A 10/10/2019   Procedure: ESOPHAGOGASTRODUODENOSCOPY (EGD) WITH PROPOFOL;  Surgeon: Kerin Salen, MD;  Location: [facility] ENDOSCOPY;  Service: Gastroenterology;  Laterality: N/A;   ESOPHAGOGASTRODUODENOSCOPY (EGD) WITH PROPOFOL N/A 12/23/2019   Procedure: ESOPHAGOGASTRODUODENOSCOPY (EGD) WITH PROPOFOL;  Surgeon: Kathi Der, MD;  Location: [facility] ENDOSCOPY;  Service: Gastroenterology;  Laterality: N/A;   ESOPHAGOGASTRODUODENOSCOPY (EGD) WITH PROPOFOL N/A 01/12/2020   Procedure: ESOPHAGOGASTRODUODENOSCOPY (EGD) WITH PROPOFOL;  Surgeon: Kathi Der, MD;  Location: [facility] ENDOSCOPY;  Service: Gastroenterology;  Laterality: N/A;   ESOPHAGOGASTRODUODENOSCOPY (EGD) WITH PROPOFOL N/A 09/10/2020   Procedure: ESOPHAGOGASTRODUODENOSCOPY (EGD) WITH PROPOFOL;  Surgeon: Benancio Deeds, MD;  Location: [facility] ENDOSCOPY;  Service: Gastroenterology;  Laterality: N/A;   ESOPHAGOGASTRODUODENOSCOPY (EGD) WITH PROPOFOL N/A 04/16/2021   Procedure: ESOPHAGOGASTRODUODENOSCOPY (EGD) WITH PROPOFOL;  Surgeon: Imogene Burn, MD;  Location: [facility] ENDOSCOPY;  Service: Gastroenterology;  Laterality: N/A;   ESOPHAGOGASTRODUODENOSCOPY (EGD) WITH PROPOFOL N/A 05/21/2021   Procedure: ESOPHAGOGASTRODUODENOSCOPY (EGD) WITH PROPOFOL;  Surgeon: Rachael Fee, MD;  Location: [facility] ENDOSCOPY;  Service: Endoscopy;  Laterality: N/A;   ESOPHAGOGASTRODUODENOSCOPY (EGD) WITH PROPOFOL N/A 08/01/2021   Procedure: ESOPHAGOGASTRODUODENOSCOPY (EGD) WITH PROPOFOL;  Surgeon: Benancio Deeds, MD;  Location: [facility] ENDOSCOPY;  Service: Gastroenterology;  Laterality: N/A;   ESOPHAGOGASTRODUODENOSCOPY (EGD) WITH PROPOFOL N/A 01/01/2022   Procedure: ESOPHAGOGASTRODUODENOSCOPY (EGD) WITH PROPOFOL;  Surgeon: Meryl Dare, MD;  Location: [facility] ENDOSCOPY;  Service: Gastroenterology;  Laterality: N/A;   ESOPHAGOGASTRODUODENOSCOPY (EGD) WITH PROPOFOL N/A 07/15/2022   Procedure: ESOPHAGOGASTRODUODENOSCOPY  (EGD) WITH PROPOFOL;  Surgeon: Benancio Deeds, MD;  Location: [facility] ENDOSCOPY;  Service: Gastroenterology;  Laterality: N/A;   GASTRIC VARICES BANDING  12/05/2019   Procedure: GASTRIC VARICES BANDING;  Surgeon: Kathi Der, MD;  Location: [facility] ENDOSCOPY;  Service: Gastroenterology;;  placed 6 bands   HOT HEMOSTASIS N/A 12/05/2019   Procedure: HOT HEMOSTASIS (ARGON PLASMA COAGULATION/BICAP);  Surgeon: Kathi Der, MD;  Location: [facility] ENDOSCOPY;  Service: Gastroenterology;  Laterality: N/A;   IR RADIOLOGIST EVAL & MGMT  12/13/2019    ROS: Review of Systems Negative except as stated above  PHYSICAL EXAM: BP 106/69   Pulse 66   Temp 97.7 F (36.5 C) (Oral)   Ht 5\' 8"  (1.727 m)   Wt 133 lb 12.8 oz (60.7 kg)   SpO2 95%   BMI 20.34 kg/m   Physical Exam HENT:     Head: Normocephalic and atraumatic.     Nose: Nose normal.     Mouth/Throat:  Mouth: Mucous membranes are moist.     Pharynx: Oropharynx is clear.  Eyes:     Extraocular Movements: Extraocular movements intact.     Conjunctiva/sclera: Conjunctivae normal.     Pupils: Pupils are equal, round, and reactive to light.  Cardiovascular:     Rate and Rhythm: Normal rate and regular rhythm.     Pulses: Normal pulses.     Heart sounds: Normal heart sounds.  Pulmonary:     Effort: Pulmonary effort is normal.     Breath sounds: Normal breath sounds.  Musculoskeletal:        General: Normal range of motion.     Cervical back: Normal range of motion and neck supple.  Neurological:     General: No focal deficit present.     Mental Status: He is alert and oriented to person, place, and time.  Psychiatric:        Mood and Affect: Mood normal.        Behavior: Behavior normal.     ASSESSMENT AND PLAN: 1. Anxiety and depression - Patient denies thoughts of self-harm, suicidal ideations, homicidal ideations. - Continue Sertraline as prescribed. Counseled on medication adherence/adverse effects.  - Follow-up with  primary provider in 3 months or sooner if needed.  - sertraline (ZOLOFT) 100 MG tablet; Take 1 tablet (100 mg total) by mouth daily.  Dispense: 90 tablet; Refill: 0   Patient was given the opportunity to ask questions.  Patient verbalized understanding of the plan and was able to repeat key elements of the plan. Patient was given clear instructions to go to Emergency Department or return to medical center if symptoms don't improve, worsen, or new problems develop.The patient verbalized understanding.    Requested Prescriptions   Signed Prescriptions Disp Refills   sertraline (ZOLOFT) 100 MG tablet 90 tablet 0    Sig: Take 1 tablet (100 mg total) by mouth daily.    Return in about 3 months (around 02/21/2023) for Follow-Up or next available anxiety depression.  Rema Fendt, NP

## 2022-12-22 ENCOUNTER — Telehealth: Payer: Self-pay

## 2022-12-22 DIAGNOSIS — I8500 Esophageal varices without bleeding: Secondary | ICD-10-CM

## 2022-12-22 DIAGNOSIS — K7030 Alcoholic cirrhosis of liver without ascites: Secondary | ICD-10-CM

## 2022-12-22 DIAGNOSIS — K7682 Hepatic encephalopathy: Secondary | ICD-10-CM

## 2022-12-22 NOTE — Telephone Encounter (Signed)
-----   Message from [facility] sent at 07/07/2022 11:51 AM EDT ----- Regarding: due for RUQ U/S Patient w/ cirrhosis due for RUQ U/S in early Sept

## 2022-12-22 NOTE — Telephone Encounter (Signed)
Order placed.  Message to schedules to call patient to schedule his U/S. MyChart message to patient to expect their call

## 2022-12-30 ENCOUNTER — Other Ambulatory Visit: Payer: Self-pay

## 2022-12-30 MED ORDER — LACTULOSE 10 GM/15ML PO SOLN: 15.0000 g | Freq: Two times a day (BID) | ORAL | 2 refills | Status: DC

## 2023-02-09 ENCOUNTER — Telehealth: Payer: Self-pay | Admitting: Family

## 2023-02-09 NOTE — Telephone Encounter (Signed)
Please schedule appt for medical clearance

## 2023-02-09 NOTE — Telephone Encounter (Signed)
Will forward to correct office.

## 2023-02-09 NOTE — Telephone Encounter (Signed)
Patient dropped off document Surgical Clearance, to be filled out by provider. Patient requested to send it back via Fax within asap. Document is located in providers tray at front office.Please fax at  [phone]

## 2023-02-09 NOTE — Telephone Encounter (Signed)
Appointment scheduled.

## 2023-02-09 NOTE — Telephone Encounter (Signed)
Fredric Mare calling calling from [facility] is calling to follow up on Medical Clearance form sent on 01/27/23 Needing Latest chart notes and labs, and clearance. Unable to schedule appt until receive clearance. [phone]

## 2023-02-10 ENCOUNTER — Encounter: Payer: Self-pay | Admitting: Family

## 2023-02-10 ENCOUNTER — Ambulatory Visit (INDEPENDENT_AMBULATORY_CARE_PROVIDER_SITE_OTHER): Payer: Medicare Other | Admitting: Family

## 2023-02-10 VITALS — BP 125/79 | HR 61 | Temp 98.4°F | Ht 68.0 in | Wt 133.4 lb

## 2023-02-10 DIAGNOSIS — Z803 Family history of malignant neoplasm of breast: Secondary | ICD-10-CM

## 2023-02-10 DIAGNOSIS — Z01818 Encounter for other preprocedural examination: Secondary | ICD-10-CM

## 2023-02-10 DIAGNOSIS — F32A Depression, unspecified: Secondary | ICD-10-CM

## 2023-02-10 DIAGNOSIS — Z5320 Procedure and treatment not carried out because of patient's decision for unspecified reasons: Secondary | ICD-10-CM

## 2023-02-10 DIAGNOSIS — Z23 Encounter for immunization: Secondary | ICD-10-CM

## 2023-02-10 DIAGNOSIS — I8501 Esophageal varices with bleeding: Secondary | ICD-10-CM

## 2023-02-10 DIAGNOSIS — F419 Anxiety disorder, unspecified: Secondary | ICD-10-CM

## 2023-02-10 MED ORDER — SERTRALINE HCL 100 MG PO TABS: 100.0000 mg | ORAL_TABLET | Freq: Every day | ORAL | 0 refills | Status: DC

## 2023-02-10 MED ORDER — PANTOPRAZOLE SODIUM 40 MG PO TBEC: 40.0000 mg | DELAYED_RELEASE_TABLET | Freq: Two times a day (BID) | ORAL | 5 refills | Status: DC

## 2023-02-10 NOTE — Progress Notes (Signed)
Patient states no concerns to discuss just  here for paperwork.  Patient wants Flu vaccine.

## 2023-02-10 NOTE — Telephone Encounter (Signed)
Gave paperwork to Amy

## 2023-02-10 NOTE — Progress Notes (Signed)
Patient ID: [name], male    DOB: 11/05/1980  MRN: [id number]  CC: Medical Clearance   Subjective: [name] is a 42 y.o. male who presents for medical clearance.   His concerns today include:  - Presents for medical clearance from [facility] Medical Clearance for wisdom teeth removal.  - Family history of breast cancer of mother. He declines mammogram.  - Doing well on Sertraline, no issues/concerns. He denies thoughts of self-harm, suicidal ideations, homicidal ideations. - Doing well on Pantoprazole, no issues/concerns. Established with Gastroenterology.   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   Esophageal varices with bleeding (HCC) 05/16/2020   Encephalopathy 05/16/2020   GI bleed 10/09/2019   Hematemesis 10/09/2019   Leukocytosis 10/09/2019   Elevated liver enzymes 10/09/2019   Elevated lactic acid level 09/02/2019   Bleeding esophageal varices (HCC) 09/02/2019   Shock (HCC) 08/30/2019   Electrolyte disturbance 07/16/2019   Lactic acidosis 07/16/2019   Hyperammonemia (HCC) 07/16/2019   Hyperbilirubinemia 07/16/2019   Alcoholic cirrhosis of liver without ascites (HCC) 07/24/2016   Acute encephalopathy    Acute upper GI bleed    Ascites    Distended abdomen    Hemorrhagic shock (HCC)    Encounter for palliative care    Goals of care, counseling/discussion    Encephalopathy acute 08/27/2015   Alcoholic cirrhosis (HCC)    Acute  hypoxemic respiratory failure (HCC)    SBP (spontaneous bacterial peritonitis) (HCC)    Hypocalcemia 08/21/2015   Hypomagnesemia 08/21/2015   Acute blood loss anemia 08/20/2015   Alcohol abuse 08/20/2015   Elevated INR 08/20/2015   Hypoalbuminemia 08/20/2015   Hypokalemia 08/20/2015     Current Outpatient Medications on File Prior to Visit  Medication Sig Dispense Refill   Ferrous Sulfate (IRON) 325 (65 Fe) MG TABS Take 1 tablet (325 mg total) by mouth daily. (Patient taking differently: Take 325 mg by mouth daily with breakfast.) 20 tablet 0   folic acid (FOLVITE) 400 MCG tablet Take 400 mcg by mouth daily.     hydrocortisone cream 1 % Apply 1 application topically daily as needed for itching.     hydrOXYzine (VISTARIL) 25 MG capsule TAKE 1 CAPSULE(25 MG) BY MOUTH EVERY 8 HOURS AS NEEDED 30 capsule 2   lactulose (CHRONULAC) 10 GM/15ML solution Take 22.5 mLs (15 g total) by mouth in the morning and at bedtime. 1892 mL 2   Magnesium 500 MG TABS Take 500 mg by mouth in the morning and at bedtime.     Multiple Vitamin (MULTIVITAMIN WITH MINERALS) TABS tablet Take 1 tablet by mouth daily. (Patient taking differently: Take 1 tablet by mouth daily with breakfast. Men)     nadolol (CORGARD) 20 MG tablet Take 1 tablet (20 mg total) by mouth daily. 90 tablet 1   oxymetazoline (AFRIN) 0.05 % nasal spray Place 1 spray into both nostrils 2 (two) times daily as needed for congestion.     thiamine (VITAMIN B-1) 100 MG tablet Take 100 mg by mouth daily.  No current facility-administered medications on file prior to visit.    Allergies  Allergen Reactions   Penicillins Hives and Other (See Comments)    Occurred when he was an infant    Social History   Socioeconomic History   Marital status: Single    Spouse name: Not on file   Number of children: 0   Years of education: Not on file   Highest education level: Some college, no degree  Occupational History   Not on file  Tobacco Use    Smoking status: Former    Current packs/day: 1.00    Types: Cigarettes   Smokeless tobacco: Never  Vaping Use   Vaping status: Never Used  Substance and Sexual Activity   Alcohol use: Not Currently    Alcohol/week: 3.0 standard drinks of alcohol    Types: 3 Standard drinks or equivalent per week    Comment: Alcoholism   Drug use: Yes    Frequency: 7.0 times per week    Types: Marijuana   Sexual activity: Not Currently  Other Topics Concern   Not on file  Social History Narrative   Not on file   Social Determinants of Health   Financial Resource Strain: High Risk (02/09/2023)   Overall Financial Resource Strain (CARDIA)    Difficulty of Paying Living Expenses: Hard  Food Insecurity: Food Insecurity Present (02/09/2023)   Hunger Vital Sign    Worried About Running Out of Food in the Last Year: Sometimes true    Ran Out of Food in the Last Year: Never true  Transportation Needs: No Transportation Needs (02/09/2023)   PRAPARE - Administrator, Civil Service (Medical): No    Lack of Transportation (Non-Medical): No  Physical Activity: Sufficiently Active (02/09/2023)   Exercise Vital Sign    Days of Exercise per Week: 7 days    Minutes of Exercise per Session: 60 min  Stress: No Stress Concern Present (02/09/2023)   [employer] of Occupational Health - Occupational Stress Questionnaire    Feeling of Stress : Only a little  Recent Concern: Stress - Stress Concern Present (11/20/2022)   [employer] of Occupational Health - Occupational Stress Questionnaire    Feeling of Stress : To some extent  Social Connections: Socially Isolated (02/09/2023)   Social Connection and Isolation Panel [NHANES]    Frequency of Communication with Friends and Family: More than three times a week    Frequency of Social Gatherings with Friends and Family: More than three times a week    Attends Religious Services: Never    Database administrator or Organizations: No     Attends Engineer, structural: Not on file    Marital Status: Never married  Catering manager Violence: Not on file    Family History  Problem Relation Age of Onset   Other Mother        pre-diabetes   COPD Father        smoker   Meniere's disease Father    Stroke Maternal Grandmother    Heart attack Maternal Grandfather    Stroke Paternal Grandfather    Crohn's disease Maternal Uncle    Heart attack Maternal Uncle    Peptic Ulcer Paternal Aunt     Past Surgical History:  Procedure Laterality Date   ESOPHAGEAL BANDING  08/30/2019   Procedure: ESOPHAGEAL BANDING;  Surgeon: Kerin Salen, MD;  Location: [facility] ENDOSCOPY;  Service: Gastroenterology;;   ESOPHAGEAL BANDING  10/10/2019   Procedure: ESOPHAGEAL BANDING;  Surgeon: Kerin Salen, MD;  Location: [facility] ENDOSCOPY;  Service: Gastroenterology;;   ESOPHAGEAL BANDING N/A 12/23/2019   Procedure: ESOPHAGEAL BANDING;  Surgeon: Kathi Der, MD;  Location: [facility] ENDOSCOPY;  Service: Gastroenterology;  Laterality: N/A;   ESOPHAGEAL BANDING N/A 01/12/2020   Procedure: ESOPHAGEAL BANDING;  Surgeon: Kathi Der, MD;  Location: [facility] ENDOSCOPY;  Service: Gastroenterology;  Laterality: N/A;   ESOPHAGEAL BANDING N/A 09/10/2020   Procedure: ESOPHAGEAL BANDING;  Surgeon: Benancio Deeds, MD;  Location: [facility] ENDOSCOPY;  Service: Gastroenterology;  Laterality: N/A;   ESOPHAGEAL BANDING  04/16/2021   Procedure: ESOPHAGEAL BANDING;  Surgeon: Imogene Burn, MD;  Location: [facility] ENDOSCOPY;  Service: Gastroenterology;;   ESOPHAGEAL BANDING N/A 05/21/2021   Procedure: ESOPHAGEAL BANDING;  Surgeon: Rachael Fee, MD;  Location: [facility] ENDOSCOPY;  Service: Endoscopy;  Laterality: N/A;   ESOPHAGOGASTRODUODENOSCOPY Left 08/21/2015   Procedure: ESOPHAGOGASTRODUODENOSCOPY (EGD);  Surgeon: Charlott Rakes, MD;  Location: [facility] ENDOSCOPY;  Service: Endoscopy;  Laterality: Left;   ESOPHAGOGASTRODUODENOSCOPY N/A 12/05/2019   Procedure: ESOPHAGOGASTRODUODENOSCOPY  (EGD);  Surgeon: Kathi Der, MD;  Location: [facility] ENDOSCOPY;  Service: Gastroenterology;  Laterality: N/A;   ESOPHAGOGASTRODUODENOSCOPY (EGD) WITH PROPOFOL N/A 08/30/2019   Procedure: ESOPHAGOGASTRODUODENOSCOPY (EGD) WITH PROPOFOL;  Surgeon: Kerin Salen, MD;  Location: [facility] ENDOSCOPY;  Service: Gastroenterology;  Laterality: N/A;   ESOPHAGOGASTRODUODENOSCOPY (EGD) WITH PROPOFOL N/A 10/10/2019   Procedure: ESOPHAGOGASTRODUODENOSCOPY (EGD) WITH PROPOFOL;  Surgeon: Kerin Salen, MD;  Location: [facility] ENDOSCOPY;  Service: Gastroenterology;  Laterality: N/A;   ESOPHAGOGASTRODUODENOSCOPY (EGD) WITH PROPOFOL N/A 12/23/2019   Procedure: ESOPHAGOGASTRODUODENOSCOPY (EGD) WITH PROPOFOL;  Surgeon: Kathi Der, MD;  Location: [facility] ENDOSCOPY;  Service: Gastroenterology;  Laterality: N/A;   ESOPHAGOGASTRODUODENOSCOPY (EGD) WITH PROPOFOL N/A 01/12/2020   Procedure: ESOPHAGOGASTRODUODENOSCOPY (EGD) WITH PROPOFOL;  Surgeon: Kathi Der, MD;  Location: [facility] ENDOSCOPY;  Service: Gastroenterology;  Laterality: N/A;   ESOPHAGOGASTRODUODENOSCOPY (EGD) WITH PROPOFOL N/A 09/10/2020   Procedure: ESOPHAGOGASTRODUODENOSCOPY (EGD) WITH PROPOFOL;  Surgeon: Benancio Deeds, MD;  Location: [facility] ENDOSCOPY;  Service: Gastroenterology;  Laterality: N/A;   ESOPHAGOGASTRODUODENOSCOPY (EGD) WITH PROPOFOL N/A 04/16/2021   Procedure: ESOPHAGOGASTRODUODENOSCOPY (EGD) WITH PROPOFOL;  Surgeon: Imogene Burn, MD;  Location: [facility] ENDOSCOPY;  Service: Gastroenterology;  Laterality: N/A;   ESOPHAGOGASTRODUODENOSCOPY (EGD) WITH PROPOFOL N/A 05/21/2021   Procedure: ESOPHAGOGASTRODUODENOSCOPY (EGD) WITH PROPOFOL;  Surgeon: Rachael Fee, MD;  Location: [facility] ENDOSCOPY;  Service: Endoscopy;  Laterality: N/A;   ESOPHAGOGASTRODUODENOSCOPY (EGD) WITH PROPOFOL N/A 08/01/2021   Procedure: ESOPHAGOGASTRODUODENOSCOPY (EGD) WITH PROPOFOL;  Surgeon: Benancio Deeds, MD;  Location: [facility] ENDOSCOPY;  Service: Gastroenterology;  Laterality: N/A;    ESOPHAGOGASTRODUODENOSCOPY (EGD) WITH PROPOFOL N/A 01/01/2022   Procedure: ESOPHAGOGASTRODUODENOSCOPY (EGD) WITH PROPOFOL;  Surgeon: Meryl Dare, MD;  Location: [facility] ENDOSCOPY;  Service: Gastroenterology;  Laterality: N/A;   ESOPHAGOGASTRODUODENOSCOPY (EGD) WITH PROPOFOL N/A 07/15/2022   Procedure: ESOPHAGOGASTRODUODENOSCOPY (EGD) WITH PROPOFOL;  Surgeon: Benancio Deeds, MD;  Location: [facility] ENDOSCOPY;  Service: Gastroenterology;  Laterality: N/A;   GASTRIC VARICES BANDING  12/05/2019   Procedure: GASTRIC VARICES BANDING;  Surgeon: Kathi Der, MD;  Location: [facility] ENDOSCOPY;  Service: Gastroenterology;;  placed 6 bands   HOT HEMOSTASIS N/A 12/05/2019   Procedure: HOT HEMOSTASIS (ARGON PLASMA COAGULATION/BICAP);  Surgeon: Kathi Der, MD;  Location: [facility] ENDOSCOPY;  Service: Gastroenterology;  Laterality: N/A;   IR RADIOLOGIST EVAL & MGMT  12/13/2019    ROS: Review of Systems Negative except as stated above  PHYSICAL EXAM: BP 125/79   Pulse 61   Temp 98.4 F (36.9 C) (Oral)   Ht 5\' 8"  (  1.727 m)   Wt 133 lb 6.4 oz (60.5 kg)   SpO2 97%   BMI 20.28 kg/m   Physical Exam HENT:     Head: Normocephalic and atraumatic.     Nose: Nose normal.     Mouth/Throat:     Mouth: Mucous membranes are moist.     Pharynx: Oropharynx is clear.  Eyes:     Extraocular Movements: Extraocular movements intact.     Conjunctiva/sclera: Conjunctivae normal.     Pupils: Pupils are equal, round, and reactive to light.  Cardiovascular:     Rate and Rhythm: Normal rate and regular rhythm.     Pulses: Normal pulses.     Heart sounds: Normal heart sounds.  Pulmonary:     Effort: Pulmonary effort is normal.     Breath sounds: Normal breath sounds.  Musculoskeletal:        General: Normal range of motion.     Cervical back: Normal range of motion and neck supple.  Neurological:     General: No focal deficit present.     Mental Status: He is alert and oriented to person, place, and time.   Psychiatric:        Mood and Affect: Mood normal.        Behavior: Behavior normal.     ASSESSMENT AND PLAN: 1. Preoperative clearance - Routine screening.  - CMP14+EGFR - CBC - Hemoglobin A1c - EKG 12-Lead  2. Family history of breast cancer 3. Mammogram declined - Patient declined mammogram.  4. Anxiety and depression - Patient denies thoughts of self-harm, suicidal ideations, homicidal ideations. - Continue Sertraline as prescribed. - Follow-up with primary provider in 3 months or sooner if needed.  - sertraline (ZOLOFT) 100 MG tablet; Take 1 tablet (100 mg total) by mouth daily.  Dispense: 90 tablet; Refill: 0  5. Bleeding esophageal varices, unspecified esophageal varices type (HCC) - Continue Pantoprazole as prescribed. Counseled on medication adherence/adverse effects.  - Keep all scheduled appointments with Gastroenterology.  - pantoprazole (PROTONIX) 40 MG tablet; Take 1 tablet (40 mg total) by mouth 2 (two) times daily before a meal.  Dispense: 60 tablet; Refill: 5   Patient was given the opportunity to ask questions.  Patient verbalized understanding of the plan and was able to repeat key elements of the plan. Patient was given clear instructions to go to Emergency Department or return to medical center if symptoms don't improve, worsen, or new problems develop.The patient verbalized understanding.   Orders Placed This Encounter  Procedures   CMP14+EGFR   CBC   Hemoglobin A1c   EKG 12-Lead     Requested Prescriptions   Signed Prescriptions Disp Refills   pantoprazole (PROTONIX) 40 MG tablet 60 tablet 5    Sig: Take 1 tablet (40 mg total) by mouth 2 (two) times daily before a meal.   sertraline (ZOLOFT) 100 MG tablet 90 tablet 0    Sig: Take 1 tablet (100 mg total) by mouth daily.    Return in about 3 months (around 05/13/2023) for Follow-Up or next available chronic conditions.  Rema Fendt, NP

## 2023-02-11 LAB — CBC
Hematocrit: 43.7 % (ref 37.5–51.0)
Hemoglobin: 14.7 g/dL (ref 13.0–17.7)
MCH: 33 pg (ref 26.6–33.0)
MCHC: 33.6 g/dL (ref 31.5–35.7)
MCV: 98 fL — ABNORMAL HIGH (ref 79–97)
Platelets: 167 x10E3/uL (ref 150–450)
RBC: 4.46 x10E6/uL (ref 4.14–5.80)
RDW: 12.3 % (ref 11.6–15.4)
WBC: 6.6 x10E3/uL (ref 3.4–10.8)

## 2023-02-11 LAB — HEMOGLOBIN A1C
Est. average glucose Bld gHb Est-mCnc: 103 mg/dL
Hgb A1c MFr Bld: 5.2 % (ref 4.8–5.6)

## 2023-02-11 LAB — CMP14+EGFR
ALT: 22 IU/L (ref 0–44)
AST: 35 IU/L (ref 0–40)
Albumin: 4.3 g/dL (ref 4.1–5.1)
Alkaline Phosphatase: 49 IU/L (ref 44–121)
BUN/Creatinine Ratio: 17 (ref 9–20)
BUN: 14 mg/dL (ref 6–24)
Bilirubin Total: 0.5 mg/dL (ref 0.0–1.2)
CO2: 23 mmol/L (ref 20–29)
Calcium: 9.4 mg/dL (ref 8.7–10.2)
Chloride: 104 mmol/L (ref 96–106)
Creatinine, Ser: 0.83 mg/dL (ref 0.76–1.27)
Globulin, Total: 3.7 g/dL (ref 1.5–4.5)
Glucose: 99 mg/dL (ref 70–99)
Potassium: 4.4 mmol/L (ref 3.5–5.2)
Sodium: 144 mmol/L (ref 134–144)
Total Protein: 8 g/dL (ref 6.0–8.5)
eGFR: 111 mL/min/1.73

## 2023-02-11 NOTE — Telephone Encounter (Signed)
Paper work left up front for fax

## 2023-02-14 ENCOUNTER — Other Ambulatory Visit: Payer: Self-pay | Admitting: Family

## 2023-02-14 DIAGNOSIS — F419 Anxiety disorder, unspecified: Secondary | ICD-10-CM

## 2023-02-14 DIAGNOSIS — F32A Depression, unspecified: Secondary | ICD-10-CM

## 2023-02-16 NOTE — Telephone Encounter (Signed)
Fredric Mare with [facility] has called in reference to a surgical clearance that was receieved for patient. Per Fredric Mare, all that was received was a check mark that states cleared without restrictions however Fredric Mare states they need medical history, most recent office notes and labs, and a full list of medications. Fredric Mare states it is marked on the paperwork all of this what is needed. Please advise.    [facility] PHONE # [phone] Fredric Mare) FAX # [phone]

## 2023-02-16 NOTE — Telephone Encounter (Signed)
Sertraline prescribed 02/10/2023 for 90 day supply.

## 2023-02-16 NOTE — Telephone Encounter (Signed)
Office notes, labs and list of medication has been successfully faxed*

## 2023-02-17 ENCOUNTER — Other Ambulatory Visit: Payer: Self-pay

## 2023-02-17 MED ORDER — NADOLOL 20 MG PO TABS: 20.0000 mg | ORAL_TABLET | Freq: Every day | ORAL | 0 refills | Status: DC

## 2023-02-24 ENCOUNTER — Ambulatory Visit: Payer: Medicare Other | Admitting: Family

## 2023-05-13 ENCOUNTER — Encounter: Payer: Medicare Other | Admitting: Family

## 2023-05-13 ENCOUNTER — Telehealth: Payer: Self-pay | Admitting: Family

## 2023-05-13 NOTE — Telephone Encounter (Signed)
 Called pt to reschedule missed appt; could not reach or leave vm

## 2023-05-13 NOTE — Progress Notes (Signed)
 Erroneous encounter-disregard

## 2023-05-23 ENCOUNTER — Other Ambulatory Visit: Payer: Self-pay | Admitting: Family

## 2023-05-23 DIAGNOSIS — F419 Anxiety disorder, unspecified: Secondary | ICD-10-CM

## 2023-05-25 ENCOUNTER — Other Ambulatory Visit: Payer: Self-pay

## 2023-05-25 MED ORDER — NADOLOL 20 MG PO TABS: 20.0000 mg | ORAL_TABLET | Freq: Every day | ORAL | 1 refills | Status: DC

## 2023-05-27 NOTE — Telephone Encounter (Signed)
Called and spoke to patient.  Scheduled him with Deanna in March.

## 2023-06-09 ENCOUNTER — Other Ambulatory Visit: Payer: Self-pay | Admitting: Physician Assistant

## 2023-07-01 NOTE — Progress Notes (Unsigned)
 Chief Complaint:*** Primary GI Doctor: Dr. Adela Lank  HPI: 43 y.o. male  with a past medical history of alcoholic cirrhosis, history of variceal bleeding (2017), history of hepatic encephalopathy  and others listed below, returns to clinic today for evaluation of ***    08/01/2021 endoscopy with Dr. Adela Lank showed grade 1 disease that flattened with insufflation, normal stomach and duodenum.  Did show portal hypertensive gastropathy 05/21/2021 Endoscopy with Dr. Christella Hartigan medium size esophageal varices distal mid esophagus 7 ligating bands complete eradication mild portal gastropathy 04/16/2021 endoscopy with Dr. Leonides Schanz 6 ligating bands completely eradicated   Patient has been evaluated by [facility] hepatology however MELD score has not been high enough. Patient's been on nadolol 20 mg daily, tolerates it well with BP/HR, has been considered for TIPS in the past. Went to the ER with hematemesis 3 times and one episode of black stools.  States has not had alcohol within the last year however ER note states had alcohol 2 days ago when questioned. 01/01/2022-EGD Dr. Russella Dar during hospitalization for hematemesis showed grade 1 varices flattened with insufflation, moderate portal hypertensive gastropathy.  Suspected from portal gastropathy.   States he is still on the nadolol 20 mg daily and tolerates it well.  He denies any melena or hematemesis.  Patient does have history of encephalopathy. and They are on lactulose only, 15mg  twice a day. They have 3-5 BM's a day.  He states a month ago he had one 12 oz drink, denies any since that time.  He denies swelling.  He is not on spironolactone and lasix.   Last HCC screen: 12/30/2021 RUQ [place] 07/04/2022, MRI 09/11/22 Due: 02/2023 Last AFP 03/12/2021 4.4 ,02/05/22 6.5,  08/21/22 6.8 Last INR: 12/30/2021 1.3  MELD 3.0: 12 at 01/01/2022  4:23 AM MELD-Na: 12 at 01/01/2022  4:23 AM Calculated from: Serum Creatinine: 0.87 mg/dL (Using min of 1 mg/dL) at 05/03/1094   0:45 AM Serum Sodium: 141 mmol/L (Using max of 137 mmol/L) at 01/01/2022  4:23 AM Total Bilirubin: 2.0 mg/dL at 4/0/9811  9:14 AM Serum Albumin: 3.4 g/dL at 11/03/2954  2:13 AM INR(ratio): 1.3 at 12/30/2021 10:33 AM Age at listing (hypothetical): 41 years Sex: Male at 01/01/2022  4:23 AM   Hepatitis immunity status:  08/15/2020 HepA REACTIVE  08/15/2020 HepAsAB REACTIVE   He  reports that he has quit smoking. His smoking use included cigarettes. He smoked an average of 1 pack per day. He has never used smokeless tobacco. He reports that he does not currently use alcohol after a past usage of about 3.0 standard drinks of alcohol per week. He reports current drug use. Frequency: 7.00 times per week. Drug: Marijuana. His family history includes COPD in his father; Crohn's disease in his maternal uncle; Heart attack in his maternal grandfather and maternal uncle; Meniere's disease in his father; Other in his mother; Peptic Ulcer in his paternal aunt; Stroke in his maternal grandmother and paternal grandfather.  02/05/2022 patient last seen in GI office by Marchelle Folks, [place] for evaluation of hospital follow-up for hematemesis.  He had long discussion about alcohol cessation likely contributing factor for most recent admission.  07/15/2022 patient had EGD to survey esophageal varices with Dr. Adela Lank. - history of bleeding in the past with multiple prior bandings, has not required banding in the past year and clinically doing well. Off alcohol. Otherwise feels well today without complaints.  On nadolol daily. Last EGD 12/2021. Impression:  - Esophagogastric landmarks identified.  - 1 cm hiatal hernia.  -  Grade I / small esophageal varices without high risk stigmata for bleeding  - stable over the past year.  - Portal hypertensive gastropathy.  - Normal examined duodenum.  07/04/2022 [place] Abd RUQ IMPRESSION: 1. Cirrhotic liver. No mass identified. 2. No other abnormalities. 09/10/22 MR Abdomen W WO contrast for  elevated AFP IMPRESSION: Hepatic cirrhosis, with findings of portal venous hypertension including splenomegaly and esophageal varices.  No evidence of hepatic neoplasm or other acute findings.      Interval History  Patient admits/denies GERD Patient admits/denies dysphagia Patient admits/denies nausea, vomiting, or weight loss  Patient admits/denies altered bowel habits Patient admits/denies abdominal pain Patient admits/denies rectal bleeding   Denies/Admits alcohol Denies/Admits smoking Denies/Admits NSAID use. Denies/Admits they are on blood thinners.  Patients last colonoscopy Patients last EGD  Patient's family history includes  Wt Readings from Last 3 Encounters:  02/10/23 133 lb 6.4 oz (60.5 kg)  11/21/22 133 lb 12.8 oz (60.7 kg)  07/15/22 145 lb (65.8 kg)      Past Medical History:  Diagnosis Date   Anxiety    Phreesia 05/14/2020   Blood transfusion without reported diagnosis    Phreesia 05/14/2020   Cirrhosis of liver (HCC)    Depression    Phreesia 05/14/2020   Esophageal varices with hemorrhage (HCC)    ETOH abuse    GERD (gastroesophageal reflux disease)    Seizures (HCC)    Phreesia 05/14/2020   Substance abuse (HCC)    Phreesia 05/14/2020    Past Surgical History:  Procedure Laterality Date   ESOPHAGEAL BANDING  08/30/2019   Procedure: ESOPHAGEAL BANDING;  Surgeon: Kerin Salen, MD;  Location: [facility] ENDOSCOPY;  Service: Gastroenterology;;   ESOPHAGEAL BANDING  10/10/2019   Procedure: ESOPHAGEAL BANDING;  Surgeon: Kerin Salen, MD;  Location: [facility] ENDOSCOPY;  Service: Gastroenterology;;   ESOPHAGEAL BANDING N/A 12/23/2019   Procedure: ESOPHAGEAL BANDING;  Surgeon: Kathi Der, MD;  Location: [facility] ENDOSCOPY;  Service: Gastroenterology;  Laterality: N/A;   ESOPHAGEAL BANDING N/A 01/12/2020   Procedure: ESOPHAGEAL BANDING;  Surgeon: Kathi Der, MD;  Location: [facility] ENDOSCOPY;  Service: Gastroenterology;  Laterality: N/A;   ESOPHAGEAL BANDING N/A  09/10/2020   Procedure: ESOPHAGEAL BANDING;  Surgeon: Benancio Deeds, MD;  Location: [facility] ENDOSCOPY;  Service: Gastroenterology;  Laterality: N/A;   ESOPHAGEAL BANDING  04/16/2021   Procedure: ESOPHAGEAL BANDING;  Surgeon: Imogene Burn, MD;  Location: [facility] ENDOSCOPY;  Service: Gastroenterology;;   ESOPHAGEAL BANDING N/A 05/21/2021   Procedure: ESOPHAGEAL BANDING;  Surgeon: Rachael Fee, MD;  Location: [facility] ENDOSCOPY;  Service: Endoscopy;  Laterality: N/A;   ESOPHAGOGASTRODUODENOSCOPY Left 08/21/2015   Procedure: ESOPHAGOGASTRODUODENOSCOPY (EGD);  Surgeon: Charlott Rakes, MD;  Location: [facility] ENDOSCOPY;  Service: Endoscopy;  Laterality: Left;   ESOPHAGOGASTRODUODENOSCOPY N/A 12/05/2019   Procedure: ESOPHAGOGASTRODUODENOSCOPY (EGD);  Surgeon: Kathi Der, MD;  Location: [facility] ENDOSCOPY;  Service: Gastroenterology;  Laterality: N/A;   ESOPHAGOGASTRODUODENOSCOPY (EGD) WITH PROPOFOL N/A 08/30/2019   Procedure: ESOPHAGOGASTRODUODENOSCOPY (EGD) WITH PROPOFOL;  Surgeon: Kerin Salen, MD;  Location: [facility] ENDOSCOPY;  Service: Gastroenterology;  Laterality: N/A;   ESOPHAGOGASTRODUODENOSCOPY (EGD) WITH PROPOFOL N/A 10/10/2019   Procedure: ESOPHAGOGASTRODUODENOSCOPY (EGD) WITH PROPOFOL;  Surgeon: Kerin Salen, MD;  Location: [facility] ENDOSCOPY;  Service: Gastroenterology;  Laterality: N/A;   ESOPHAGOGASTRODUODENOSCOPY (EGD) WITH PROPOFOL N/A 12/23/2019   Procedure: ESOPHAGOGASTRODUODENOSCOPY (EGD) WITH PROPOFOL;  Surgeon: Kathi Der, MD;  Location: [facility] ENDOSCOPY;  Service: Gastroenterology;  Laterality: N/A;   ESOPHAGOGASTRODUODENOSCOPY (EGD) WITH PROPOFOL N/A 01/12/2020   Procedure: ESOPHAGOGASTRODUODENOSCOPY (EGD) WITH PROPOFOL;  Surgeon: Levora Angel,  Darcus Austin, MD;  Location: [facility] ENDOSCOPY;  Service: Gastroenterology;  Laterality: N/A;   ESOPHAGOGASTRODUODENOSCOPY (EGD) WITH PROPOFOL N/A 09/10/2020   Procedure: ESOPHAGOGASTRODUODENOSCOPY (EGD) WITH PROPOFOL;  Surgeon: Benancio Deeds, MD;  Location: [facility] ENDOSCOPY;   Service: Gastroenterology;  Laterality: N/A;   ESOPHAGOGASTRODUODENOSCOPY (EGD) WITH PROPOFOL N/A 04/16/2021   Procedure: ESOPHAGOGASTRODUODENOSCOPY (EGD) WITH PROPOFOL;  Surgeon: Imogene Burn, MD;  Location: [facility] ENDOSCOPY;  Service: Gastroenterology;  Laterality: N/A;   ESOPHAGOGASTRODUODENOSCOPY (EGD) WITH PROPOFOL N/A 05/21/2021   Procedure: ESOPHAGOGASTRODUODENOSCOPY (EGD) WITH PROPOFOL;  Surgeon: Rachael Fee, MD;  Location: [facility] ENDOSCOPY;  Service: Endoscopy;  Laterality: N/A;   ESOPHAGOGASTRODUODENOSCOPY (EGD) WITH PROPOFOL N/A 08/01/2021   Procedure: ESOPHAGOGASTRODUODENOSCOPY (EGD) WITH PROPOFOL;  Surgeon: Benancio Deeds, MD;  Location: [facility] ENDOSCOPY;  Service: Gastroenterology;  Laterality: N/A;   ESOPHAGOGASTRODUODENOSCOPY (EGD) WITH PROPOFOL N/A 01/01/2022   Procedure: ESOPHAGOGASTRODUODENOSCOPY (EGD) WITH PROPOFOL;  Surgeon: Meryl Dare, MD;  Location: [facility] ENDOSCOPY;  Service: Gastroenterology;  Laterality: N/A;   ESOPHAGOGASTRODUODENOSCOPY (EGD) WITH PROPOFOL N/A 07/15/2022   Procedure: ESOPHAGOGASTRODUODENOSCOPY (EGD) WITH PROPOFOL;  Surgeon: Benancio Deeds, MD;  Location: [facility] ENDOSCOPY;  Service: Gastroenterology;  Laterality: N/A;   GASTRIC VARICES BANDING  12/05/2019   Procedure: GASTRIC VARICES BANDING;  Surgeon: Kathi Der, MD;  Location: [facility] ENDOSCOPY;  Service: Gastroenterology;;  placed 6 bands   HOT HEMOSTASIS N/A 12/05/2019   Procedure: HOT HEMOSTASIS (ARGON PLASMA COAGULATION/BICAP);  Surgeon: Kathi Der, MD;  Location: [facility] ENDOSCOPY;  Service: Gastroenterology;  Laterality: N/A;   IR RADIOLOGIST EVAL & MGMT  12/13/2019    Current Outpatient Medications  Medication Sig Dispense Refill   Ferrous Sulfate (IRON) 325 (65 Fe) MG TABS Take 1 tablet (325 mg total) by mouth daily. (Patient taking differently: Take 325 mg by mouth daily with breakfast.) 20 tablet 0   folic acid (FOLVITE) 400 MCG tablet Take 400 mcg by mouth daily.     hydrocortisone cream 1 %  Apply 1 application topically daily as needed for itching.     hydrOXYzine (VISTARIL) 25 MG capsule TAKE 1 CAPSULE(25 MG) BY MOUTH EVERY 8 HOURS AS NEEDED 30 capsule 2   lactulose (CHRONULAC) 10 GM/15ML solution TAKE 22. BY MOUTH IN THE MORNING AND AT BEDTIME 1892 mL 2   Magnesium 500 MG TABS Take 500 mg by mouth in the morning and at bedtime.     Multiple Vitamin (MULTIVITAMIN WITH MINERALS) TABS tablet Take 1 tablet by mouth daily. (Patient taking differently: Take 1 tablet by mouth daily with breakfast. Men)     nadolol (CORGARD) 20 MG tablet Take 1 tablet (20 mg total) by mouth daily. NEED AN OFFICE VISIT FOR FURTHER REFILLS. 30 tablet 1   oxymetazoline (AFRIN) 0.05 % nasal spray Place 1 spray into both nostrils 2 (two) times daily as needed for congestion.     pantoprazole (PROTONIX) 40 MG tablet Take 1 tablet (40 mg total) by mouth 2 (two) times daily before a meal. 60 tablet 5   sertraline (ZOLOFT) 100 MG tablet TAKE 1 TABLET(100 MG) BY MOUTH DAILY 90 tablet 0   thiamine (VITAMIN B-1) 100 MG tablet Take 100 mg by mouth daily.     No current facility-administered medications for this visit.    Allergies as of 07/02/2023 - Review Complete 02/10/2023  Allergen Reaction Noted   Penicillins Hives and Other (See Comments) 05/26/2018    Family History  Problem Relation Age of Onset   Other Mother        pre-diabetes   COPD  Father        smoker   Meniere's disease Father    Stroke Maternal Grandmother    Heart attack Maternal Grandfather    Stroke Paternal Grandfather    Crohn's disease Maternal Uncle    Heart attack Maternal Uncle    Peptic Ulcer Paternal Aunt     Review of Systems:    Constitutional: No weight loss, fever, chills, weakness or fatigue HEENT: Eyes: No change in vision               Ears, Nose, Throat:  No change in hearing or congestion Skin: No rash or itching Cardiovascular: No chest pain, chest pressure or palpitations   Respiratory: No SOB or  cough Gastrointestinal: See HPI and otherwise negative Genitourinary: No dysuria or change in urinary frequency Neurological: No headache, dizziness or syncope Musculoskeletal: No new muscle or joint pain Hematologic: No bleeding or bruising Psychiatric: No history of depression or anxiety    Physical Exam:  Vital signs: There were no vitals taken for this visit.  Constitutional:   Pleasant Caucasian male*** appears to be in NAD, Well developed, Well nourished, alert and cooperative Head:  Normocephalic and atraumatic. Eyes:   PEERL, EOMI. No icterus. Conjunctiva pink. Ears:  Normal auditory acuity. Neck:  Supple Throat: Oral cavity and pharynx without inflammation, swelling or lesion.  Respiratory: Respirations even and unlabored. Lungs clear to auscultation bilaterally.   No wheezes, crackles, or rhonchi.  Cardiovascular: Normal S1, S2. Regular rate and rhythm. No peripheral edema, cyanosis or pallor.  Gastrointestinal:  Soft, nondistended, nontender. No rebound or guarding. Normal bowel sounds. No appreciable masses or hepatomegaly. Rectal:  Not performed.  Anoscopy: Msk:  Symmetrical without gross deformities. Without edema, no deformity or joint abnormality.  Neurologic:  Alert and  oriented x4;  grossly normal neurologically.  Skin:   Dry and intact without significant lesions or rashes. Psychiatric: Oriented to person, place and time. Demonstrates good judgement and reason without abnormal affect or behaviors.  RELEVANT LABS AND IMAGING: CBC    Latest Ref Rng & Units 02/10/2023   10:35 AM 03/18/2022   10:04 AM 02/05/2022    9:33 AM  CBC  WBC 3.4 - 10.8 x10E3/uL 6.6  7.6  5.8   Hemoglobin 13.0 - 17.7 g/dL 96.0  45.4  09.8   Hematocrit 37.5 - 51.0 % 43.7  45.1  42.4   Platelets 150 - 450 x10E3/uL 167  170.0  152.0      CMP     Latest Ref Rng & Units 02/10/2023   10:35 AM 02/05/2022    9:33 AM 01/01/2022    4:23 AM  CMP  Glucose 70 - 99 mg/dL 99  119  147   BUN  6 - 24 mg/dL 14  11  9    Creatinine 0.76 - 1.27 mg/dL 8.29  5.62  1.30   Sodium 134 - 144 mmol/L 144  140  141   Potassium 3.5 - 5.2 mmol/L 4.4  4.3  4.1   Chloride 96 - 106 mmol/L 104  102  107   CO2 20 - 29 mmol/L 23  31  27    Calcium 8.7 - 10.2 mg/dL 9.4  9.1  8.7   Total Protein 6.0 - 8.5 g/dL 8.0  7.5  6.5   Total Bilirubin 0.0 - 1.2 mg/dL 0.5  0.9  2.0   Alkaline Phos 44 - 121 IU/L 49  55  38   AST 0 - 40 IU/L 35  31  71   ALT 0 - 44 IU/L 22  29  51      Lab Results  Component Value Date   TSH 2.240 06/18/2020     Assessment: 1. ***  Plan: -Repeat upper endoscopy in 1 year for surveillance of varices (06/2023) -[place] ABD RUQ (HCC screening) -Refill lactulose -refill nadolol    Thank you for the courtesy of this consult. Please call me with any questions or concerns.   Alverda Nazzaro, FNP-C [facility] 07/01/2023, 2:05 PM  Cc: Rema Fendt, NP

## 2023-07-02 ENCOUNTER — Ambulatory Visit: Payer: Medicare Other | Admitting: Gastroenterology

## 2023-07-02 ENCOUNTER — Encounter: Payer: Self-pay | Admitting: Gastroenterology

## 2023-07-02 ENCOUNTER — Other Ambulatory Visit: Payer: Self-pay

## 2023-07-02 ENCOUNTER — Other Ambulatory Visit

## 2023-07-02 VITALS — BP 110/90 | HR 83 | Ht 67.0 in | Wt 144.6 lb

## 2023-07-02 DIAGNOSIS — I8500 Esophageal varices without bleeding: Secondary | ICD-10-CM | POA: Diagnosis not present

## 2023-07-02 DIAGNOSIS — K3189 Other diseases of stomach and duodenum: Secondary | ICD-10-CM

## 2023-07-02 DIAGNOSIS — K219 Gastro-esophageal reflux disease without esophagitis: Secondary | ICD-10-CM

## 2023-07-02 DIAGNOSIS — K7030 Alcoholic cirrhosis of liver without ascites: Secondary | ICD-10-CM | POA: Diagnosis not present

## 2023-07-02 DIAGNOSIS — K766 Portal hypertension: Secondary | ICD-10-CM | POA: Diagnosis not present

## 2023-07-02 LAB — CBC WITH DIFFERENTIAL/PLATELET
Basophils Absolute: 0.1 10*3/uL (ref 0.0–0.1)
Basophils Relative: 1.5 % (ref 0.0–3.0)
Eosinophils Absolute: 0.1 10*3/uL (ref 0.0–0.7)
Eosinophils Relative: 2.1 % (ref 0.0–5.0)
HCT: 41.8 % (ref 39.0–52.0)
Hemoglobin: 14.4 g/dL (ref 13.0–17.0)
Lymphocytes Relative: 12.8 % (ref 12.0–46.0)
Lymphs Abs: 0.8 10*3/uL (ref 0.7–4.0)
MCHC: 34.5 g/dL (ref 30.0–36.0)
MCV: 96.6 fl (ref 78.0–100.0)
Monocytes Absolute: 1 10*3/uL (ref 0.1–1.0)
Monocytes Relative: 15 % — ABNORMAL HIGH (ref 3.0–12.0)
Neutro Abs: 4.5 10*3/uL (ref 1.4–7.7)
Neutrophils Relative %: 68.6 % (ref 43.0–77.0)
Platelets: 149 10*3/uL — ABNORMAL LOW (ref 150.0–400.0)
RBC: 4.33 Mil/uL (ref 4.22–5.81)
RDW: 13.1 % (ref 11.5–15.5)
WBC: 6.6 10*3/uL (ref 4.0–10.5)

## 2023-07-02 LAB — COMPREHENSIVE METABOLIC PANEL
ALT: 28 U/L (ref 0–53)
AST: 33 U/L (ref 0–37)
Albumin: 4.1 g/dL (ref 3.5–5.2)
Alkaline Phosphatase: 41 U/L (ref 39–117)
BUN: 10 mg/dL (ref 6–23)
CO2: 32 meq/L (ref 19–32)
Calcium: 9.2 mg/dL (ref 8.4–10.5)
Chloride: 101 meq/L (ref 96–112)
Creatinine, Ser: 0.78 mg/dL (ref 0.40–1.50)
GFR: 109.19 mL/min (ref >60.00)
Glucose, Bld: 100 mg/dL — ABNORMAL HIGH (ref 70–99)
Potassium: 4.1 meq/L (ref 3.5–5.1)
Sodium: 140 meq/L (ref 135–145)
Total Bilirubin: 0.9 mg/dL (ref 0.2–1.2)
Total Protein: 7.6 g/dL (ref 6.0–8.3)

## 2023-07-02 MED ORDER — SUFLAVE 178.7 G PO SOLR: 1.0000 | Freq: Once | ORAL | 0 refills | Status: AC

## 2023-07-02 NOTE — Patient Instructions (Addendum)
 Call back for 6 month follow up  You will be contacted by [facility] Sched[facility] in the next 2 days to arrange a RUQ Ultrasound.  The number on your caller ID will be [phone], please answer when they call.  If you have not heard from them in 2 days please call [phone] to schedule.     You have been scheduled for an endoscopy. Please follow written instructions given to you at your visit today.  If you use inhalers (even only as needed), please bring them with you on the day of your procedure.  If you take any of the following medications, they will need to be adjusted prior to your procedure:   DO NOT TAKE 7 DAYS PRIOR TO TEST- Trulicity (dulaglutide) Ozempic, Wegovy (semaglutide) Mounjaro (tirzepatide) Bydureon Bcise (exanatide extended release)  DO NOT TAKE 1 DAY PRIOR TO YOUR TEST Rybelsus (semaglutide) Adlyxin (lixisenatide) Victoza (liraglutide) Byetta (exanatide) ___________________________________________________________________________  Due to recent changes in healthcare laws, you may see the results of your imaging and laboratory studies on MyChart before your provider has had a chance to review them.  We understand that in some cases there may be results that are confusing or concerning to you. Not all laboratory results come back in the same time frame and the provider may be waiting for multiple results in order to interpret others.  Please give [place] 48 hours in order for your provider to thoroughly review all the results before contacting the office for clarification of your results.  _______________________________________________________  If your blood pressure at your visit was 140/90 or greater, please contact your primary care physician to follow up on this.  _______________________________________________________  If you are age 44 or older, your body mass index should be between 23-30. Your Body mass index is 22.65 kg/m. If this is out of the  aforementioned range listed, please consider follow up with your Primary Care Provider.  If you are age 49 or younger, your body mass index should be between 19-25. Your Body mass index is 22.65 kg/m. If this is out of the aformentioned range listed, please consider follow up with your Primary Care Provider.   ________________________________________________________  The [facility] providers would like to encourage you to use [facility] to communicate with providers for non-urgent requests or questions.  Due to long hold times on the telephone, sending your provider a message by [facility] may be a faster and more efficient way to get a response.  Please allow 48 business hours for a response.  Please remember that this is for non-urgent requests.  _______________________________________________________  Thank you for trusting me with your gastrointestinal care!   Margarite Gouge May, NP

## 2023-07-02 NOTE — Addendum Note (Signed)
 Addended by: Craig Guess on: 07/02/2023 12:26 PM   Modules accepted: Orders

## 2023-07-02 NOTE — Progress Notes (Signed)
 Agree with assessment and plan with the following suggestions:  Deanna -I think we can do his EGD at the [facility], he has not required banding on his last few endoscopies.  He is otherwise been stable.  Can you please have your nurse schedule him for EGD at the [facility].  By chance of his varices are back or enlarged then we can do another exam at the hospital if needed, hopefully if he remains stable that is not necessary.  Thanks

## 2023-07-06 LAB — AFP TUMOR MARKER: AFP-Tumor Marker: 3.4 ng/mL (ref <6.1)

## 2023-07-15 ENCOUNTER — Telehealth: Payer: Self-pay | Admitting: Family

## 2023-07-15 NOTE — Telephone Encounter (Signed)
 A document form has been faxed: Medical Clearance, to be filled out by provider. Send document back via Fax within 5-days. Document is located in providers tray at front office.           Fax number:  [phone]    Pt is scheduled for appt 03/24 at 8:20am

## 2023-07-16 ENCOUNTER — Encounter: Payer: Self-pay | Admitting: Family

## 2023-07-16 NOTE — Telephone Encounter (Signed)
 [name], call patient with update:  Report to Emergency Department/Urgent Care/call 911 for immediate medical evaluation. Follow-up with primary provider for dental surgical clearance and any related preoperative paperwork completion.

## 2023-07-17 NOTE — Telephone Encounter (Signed)
 Paper is at desk. (UNCOMPLETED)

## 2023-07-20 ENCOUNTER — Encounter: Payer: Self-pay | Admitting: Family

## 2023-07-20 ENCOUNTER — Ambulatory Visit (INDEPENDENT_AMBULATORY_CARE_PROVIDER_SITE_OTHER): Admitting: Family

## 2023-07-20 VITALS — BP 137/81 | HR 79 | Temp 98.4°F | Ht 68.0 in | Wt 149.2 lb

## 2023-07-20 DIAGNOSIS — Z01818 Encounter for other preprocedural examination: Secondary | ICD-10-CM | POA: Diagnosis not present

## 2023-07-20 NOTE — Progress Notes (Signed)
 Patient states nothing to discuss.

## 2023-07-20 NOTE — Progress Notes (Signed)
 Patient ID: CATON POPOWSKI, male    DOB: 1980/10/08  MRN: [id number]  CC: Preoperative Clearance  Subjective: Michaeal Davis is a 43 y.o. male who presents for preoperative clearance.   His concerns today include:  Patient presents for preoperative clearance for pending dental procedure deep cleaning under local anesthetic with epinephrine at [facility]. No issues/concerns for discussion today.  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   Esophageal varices with bleeding (HCC) 05/16/2020   Encephalopathy 05/16/2020   GI bleed 10/09/2019   Hematemesis 10/09/2019   Leukocytosis 10/09/2019   Elevated liver enzymes 10/09/2019   Elevated lactic acid level 09/02/2019   Bleeding esophageal varices (HCC) 09/02/2019   Shock (HCC) 08/30/2019   Electrolyte disturbance 07/16/2019   Lactic acidosis 07/16/2019   Hyperammonemia (HCC) 07/16/2019   Hyperbilirubinemia 07/16/2019   Alcoholic cirrhosis of liver without ascites (HCC) 07/24/2016   Acute encephalopathy    Acute upper GI bleed    Ascites    Distended abdomen    Hemorrhagic shock (HCC)    Encounter for palliative care    Goals of care, counseling/discussion    Encephalopathy acute 08/27/2015   Alcoholic cirrhosis (HCC)    Acute hypoxemic respiratory failure (HCC)    SBP (spontaneous bacterial peritonitis) (HCC)    Hypocalcemia 08/21/2015   Hypomagnesemia 08/21/2015   Acute blood loss anemia 08/20/2015   Alcohol abuse 08/20/2015    Elevated INR 08/20/2015   Hypoalbuminemia 08/20/2015   Hypokalemia 08/20/2015     Current Outpatient Medications on File Prior to Visit  Medication Sig Dispense Refill   Ferrous Sulfate (IRON) 325 (65 Fe) MG TABS Take 1 tablet (325 mg total) by mouth daily. (Patient taking differently: Take 325 mg by mouth daily with breakfast.) 20 tablet 0   folic acid (FOLVITE) 400 MCG tablet Take 400 mcg by mouth daily.     hydrocortisone cream 1 % Apply 1 application topically daily as needed for itching.     hydrOXYzine (VISTARIL) 25 MG capsule TAKE 1 CAPSULE(25 MG) BY MOUTH EVERY 8 HOURS AS NEEDED 30 capsule 2   lactulose (CHRONULAC) 10 GM/15ML solution TAKE 22. BY MOUTH IN THE MORNING AND AT BEDTIME 1892 mL 2   Magnesium 500 MG TABS Take 500 mg by mouth in the morning and at bedtime.     Multiple Vitamin (MULTIVITAMIN WITH MINERALS) TABS tablet Take 1 tablet by mouth daily. (Patient taking differently: Take 1 tablet by mouth daily with breakfast. Men)     nadolol (CORGARD) 20 MG tablet Take 1 tablet (20 mg total) by mouth daily. NEED AN OFFICE VISIT FOR FURTHER REFILLS. 30 tablet 1   oxymetazoline (AFRIN) 0.05 % nasal spray Place 1 spray into both nostrils 2 (two) times daily as needed for congestion.     pantoprazole (PROTONIX) 40 MG tablet Take 1 tablet (40 mg total) by mouth 2 (two) times daily before a meal. 60 tablet 5   sertraline (ZOLOFT) 100 MG tablet TAKE 1 TABLET(100 MG) BY MOUTH DAILY 90 tablet 0   thiamine (VITAMIN B-1) 100  MG tablet Take 100 mg by mouth daily.     No current facility-administered medications on file prior to visit.    Allergies  Allergen Reactions   Penicillins Hives and Other (See Comments)    Occurred when he was an infant    Social History   Socioeconomic History   Marital status: Single    Spouse name: Not on file   Number of children: 0   Years of education: Not on file   Highest education level: Some college, no degree  Occupational History   Not on  file  Tobacco Use   Smoking status: Former    Current packs/day: 1.00    Types: Cigarettes   Smokeless tobacco: Never  Vaping Use   Vaping status: Never Used  Substance and Sexual Activity   Alcohol use: Not Currently    Alcohol/week: 3.0 standard drinks of alcohol    Types: 3 Standard drinks or equivalent per week    Comment: Alcoholism   Drug use: Yes    Frequency: 7.0 times per week    Types: Marijuana   Sexual activity: Not Currently  Other Topics Concern   Not on file  Social History Narrative   Not on file   Social Drivers of Health   Financial Resource Strain: Medium Risk (07/17/2023)   Overall Financial Resource Strain (CARDIA)    Difficulty of Paying Living Expenses: Somewhat hard  Food Insecurity: Food Insecurity Present (07/17/2023)   Hunger Vital Sign    Worried About Running Out of Food in the Last Year: Sometimes true    Ran Out of Food in the Last Year: Sometimes true  Transportation Needs: Unmet Transportation Needs (07/17/2023)   PRAPARE - Transportation    Lack of Transportation (Medical): Yes    Lack of Transportation (Non-Medical): Yes  Physical Activity: Sufficiently Active (07/17/2023)   Exercise Vital Sign    Days of Exercise per Week: 7 days    Minutes of Exercise per Session: 90 min  Stress: Stress Concern Present (07/17/2023)   [employer] of Occupational Health - Occupational Stress Questionnaire    Feeling of Stress : Rather much  Social Connections: Unknown (07/17/2023)   Social Connection and Isolation Panel [NHANES]    Frequency of Communication with Friends and Family: More than three times a week    Frequency of Social Gatherings with Friends and Family: More than three times a week    Attends Religious Services: Patient declined    Active Member of Clubs or Organizations: Yes    Attends Engineer, structural: More than 4 times per year    Marital Status: Never married  Catering manager Violence: Not on file    Family  History  Problem Relation Age of Onset   Other Mother        pre-diabetes   Breast cancer Mother    COPD Father        smoker   Meniere's disease Father    Stroke Maternal Grandmother    Heart attack Maternal Grandfather    Stroke Paternal Grandfather    Crohn's disease Maternal Uncle    Heart attack Maternal Uncle    Peptic Ulcer Paternal Aunt     Past Surgical History:  Procedure Laterality Date   ESOPHAGEAL BANDING  08/30/2019   Procedure: ESOPHAGEAL BANDING;  Surgeon: Kerin Salen, MD;  Location: [facility] ENDOSCOPY;  Service: Gastroenterology;;   ESOPHAGEAL BANDING  10/10/2019   Procedure: ESOPHAGEAL BANDING;  Surgeon: Kerin Salen, MD;  Location: [facility] ENDOSCOPY;  Service: Gastroenterology;;   ESOPHAGEAL BANDING N/A 12/23/2019   Procedure: ESOPHAGEAL BANDING;  Surgeon: Kathi Der, MD;  Location: [facility] ENDOSCOPY;  Service: Gastroenterology;  Laterality: N/A;   ESOPHAGEAL BANDING N/A 01/12/2020   Procedure: ESOPHAGEAL BANDING;  Surgeon: Kathi Der, MD;  Location: [facility] ENDOSCOPY;  Service: Gastroenterology;  Laterality: N/A;   ESOPHAGEAL BANDING N/A 09/10/2020   Procedure: ESOPHAGEAL BANDING;  Surgeon: Benancio Deeds, MD;  Location: [facility] ENDOSCOPY;  Service: Gastroenterology;  Laterality: N/A;   ESOPHAGEAL BANDING  04/16/2021   Procedure: ESOPHAGEAL BANDING;  Surgeon: Imogene Burn, MD;  Location: [facility] ENDOSCOPY;  Service: Gastroenterology;;   ESOPHAGEAL BANDING N/A 05/21/2021   Procedure: ESOPHAGEAL BANDING;  Surgeon: Rachael Fee, MD;  Location: [facility] ENDOSCOPY;  Service: Endoscopy;  Laterality: N/A;   ESOPHAGOGASTRODUODENOSCOPY Left 08/21/2015   Procedure: ESOPHAGOGASTRODUODENOSCOPY (EGD);  Surgeon: Charlott Rakes, MD;  Location: [facility] ENDOSCOPY;  Service: Endoscopy;  Laterality: Left;   ESOPHAGOGASTRODUODENOSCOPY N/A 12/05/2019   Procedure: ESOPHAGOGASTRODUODENOSCOPY (EGD);  Surgeon: Kathi Der, MD;  Location: [facility] ENDOSCOPY;  Service: Gastroenterology;  Laterality: N/A;    ESOPHAGOGASTRODUODENOSCOPY (EGD) WITH PROPOFOL N/A 08/30/2019   Procedure: ESOPHAGOGASTRODUODENOSCOPY (EGD) WITH PROPOFOL;  Surgeon: Kerin Salen, MD;  Location: [facility] ENDOSCOPY;  Service: Gastroenterology;  Laterality: N/A;   ESOPHAGOGASTRODUODENOSCOPY (EGD) WITH PROPOFOL N/A 10/10/2019   Procedure: ESOPHAGOGASTRODUODENOSCOPY (EGD) WITH PROPOFOL;  Surgeon: Kerin Salen, MD;  Location: [facility] ENDOSCOPY;  Service: Gastroenterology;  Laterality: N/A;   ESOPHAGOGASTRODUODENOSCOPY (EGD) WITH PROPOFOL N/A 12/23/2019   Procedure: ESOPHAGOGASTRODUODENOSCOPY (EGD) WITH PROPOFOL;  Surgeon: Kathi Der, MD;  Location: [facility] ENDOSCOPY;  Service: Gastroenterology;  Laterality: N/A;   ESOPHAGOGASTRODUODENOSCOPY (EGD) WITH PROPOFOL N/A 01/12/2020   Procedure: ESOPHAGOGASTRODUODENOSCOPY (EGD) WITH PROPOFOL;  Surgeon: Kathi Der, MD;  Location: [facility] ENDOSCOPY;  Service: Gastroenterology;  Laterality: N/A;   ESOPHAGOGASTRODUODENOSCOPY (EGD) WITH PROPOFOL N/A 09/10/2020   Procedure: ESOPHAGOGASTRODUODENOSCOPY (EGD) WITH PROPOFOL;  Surgeon: Benancio Deeds, MD;  Location: [facility] ENDOSCOPY;  Service: Gastroenterology;  Laterality: N/A;   ESOPHAGOGASTRODUODENOSCOPY (EGD) WITH PROPOFOL N/A 04/16/2021   Procedure: ESOPHAGOGASTRODUODENOSCOPY (EGD) WITH PROPOFOL;  Surgeon: Imogene Burn, MD;  Location: [facility] ENDOSCOPY;  Service: Gastroenterology;  Laterality: N/A;   ESOPHAGOGASTRODUODENOSCOPY (EGD) WITH PROPOFOL N/A 05/21/2021   Procedure: ESOPHAGOGASTRODUODENOSCOPY (EGD) WITH PROPOFOL;  Surgeon: Rachael Fee, MD;  Location: [facility] ENDOSCOPY;  Service: Endoscopy;  Laterality: N/A;   ESOPHAGOGASTRODUODENOSCOPY (EGD) WITH PROPOFOL N/A 08/01/2021   Procedure: ESOPHAGOGASTRODUODENOSCOPY (EGD) WITH PROPOFOL;  Surgeon: Benancio Deeds, MD;  Location: [facility] ENDOSCOPY;  Service: Gastroenterology;  Laterality: N/A;   ESOPHAGOGASTRODUODENOSCOPY (EGD) WITH PROPOFOL N/A 01/01/2022   Procedure: ESOPHAGOGASTRODUODENOSCOPY (EGD) WITH PROPOFOL;   Surgeon: Meryl Dare, MD;  Location: [facility] ENDOSCOPY;  Service: Gastroenterology;  Laterality: N/A;   ESOPHAGOGASTRODUODENOSCOPY (EGD) WITH PROPOFOL N/A 07/15/2022   Procedure: ESOPHAGOGASTRODUODENOSCOPY (EGD) WITH PROPOFOL;  Surgeon: Benancio Deeds, MD;  Location: [facility] ENDOSCOPY;  Service: Gastroenterology;  Laterality: N/A;   GASTRIC VARICES BANDING  12/05/2019   Procedure: GASTRIC VARICES BANDING;  Surgeon: Kathi Der, MD;  Location: [facility] ENDOSCOPY;  Service: Gastroenterology;;  placed 6 bands   HOT HEMOSTASIS N/A 12/05/2019   Procedure: HOT HEMOSTASIS (ARGON PLASMA COAGULATION/BICAP);  Surgeon: Kathi Der, MD;  Location: [facility] ENDOSCOPY;  Service: Gastroenterology;  Laterality: N/A;   IR RADIOLOGIST EVAL & MGMT  12/13/2019    ROS: Review of Systems Negative except as stated above  PHYSICAL EXAM: BP 137/81   Pulse 79   Temp 98.4 F (36.9 C) (Oral)   Ht 5\' 8"  (1.727 m)   Wt 149 lb 3.2 oz (  67.7 kg)   SpO2 95%   BMI 22.69 kg/m   Physical Exam HENT:     Head: Normocephalic and atraumatic.     Nose: Nose normal.     Mouth/Throat:     Mouth: Mucous membranes are moist.     Pharynx: Oropharynx is clear.  Eyes:     Extraocular Movements: Extraocular movements intact.     Conjunctiva/sclera: Conjunctivae normal.     Pupils: Pupils are equal, round, and reactive to light.  Cardiovascular:     Rate and Rhythm: Normal rate and regular rhythm.     Pulses: Normal pulses.     Heart sounds: Normal heart sounds.  Pulmonary:     Effort: Pulmonary effort is normal.     Breath sounds: Normal breath sounds.  Musculoskeletal:        General: Normal range of motion.     Cervical back: Normal range of motion and neck supple.  Neurological:     General: No focal deficit present.     Mental Status: He is alert and oriented to person, place, and time.  Psychiatric:        Mood and Affect: Mood normal.        Behavior: Behavior normal.     ASSESSMENT AND PLAN: 1. Preoperative  clearance (Primary) - EKG for evaluation. - Routine screening.  - CMP14+EGFR - CBC - Hemoglobin A1c - EKG 12-Lead   Patient was given the opportunity to ask questions.  Patient verbalized understanding of the plan and was able to repeat key elements of the plan. Patient was given clear instructions to go to Emergency Department or return to medical center if symptoms don't improve, worsen, or new problems develop.The patient verbalized understanding.   Orders Placed This Encounter  Procedures   CMP14+EGFR   CBC   Hemoglobin A1c   EKG 12-Lead   Follow-up with primary provider as scheduled.  Rema Fendt, NP

## 2023-07-20 NOTE — Telephone Encounter (Signed)
 Waiting on blood work to come back for completion.

## 2023-07-21 ENCOUNTER — Other Ambulatory Visit: Payer: Self-pay | Admitting: Family

## 2023-07-21 ENCOUNTER — Encounter: Payer: Self-pay | Admitting: *Deleted

## 2023-07-21 LAB — CMP14+EGFR
ALT: 20 IU/L (ref 0–44)
AST: 33 IU/L (ref 0–40)
Albumin: 4.3 g/dL (ref 4.1–5.1)
Alkaline Phosphatase: 52 IU/L (ref 44–121)
BUN/Creatinine Ratio: 12 (ref 9–20)
BUN: 11 mg/dL (ref 6–24)
Bilirubin Total: 0.5 mg/dL (ref 0.0–1.2)
CO2: 25 mmol/L (ref 20–29)
Calcium: 9.6 mg/dL (ref 8.7–10.2)
Chloride: 102 mmol/L (ref 96–106)
Creatinine, Ser: 0.93 mg/dL (ref 0.76–1.27)
Globulin, Total: 3.1 g/dL (ref 1.5–4.5)
Glucose: 109 mg/dL — ABNORMAL HIGH (ref 70–99)
Potassium: 4.5 mmol/L (ref 3.5–5.2)
Sodium: 142 mmol/L (ref 134–144)
Total Protein: 7.4 g/dL (ref 6.0–8.5)
eGFR: 104 mL/min/{1.73_m2} (ref >59)

## 2023-07-21 LAB — CBC
Hematocrit: 43 % (ref 37.5–51.0)
Hemoglobin: 14.3 g/dL (ref 13.0–17.7)
MCH: 32.7 pg (ref 26.6–33.0)
MCHC: 33.3 g/dL (ref 31.5–35.7)
MCV: 98 fL — ABNORMAL HIGH (ref 79–97)
Platelets: 154 10*3/uL (ref 150–450)
RBC: 4.37 x10E6/uL (ref 4.14–5.80)
RDW: 12.7 % (ref 11.6–15.4)
WBC: 5.5 10*3/uL (ref 3.4–10.8)

## 2023-07-21 LAB — HEMOGLOBIN A1C
Est. average glucose Bld gHb Est-mCnc: 103 mg/dL
Hgb A1c MFr Bld: 5.2 % (ref 4.8–5.6)

## 2023-07-21 NOTE — Progress Notes (Signed)
 Error

## 2023-07-21 NOTE — Telephone Encounter (Signed)
 Labs have been sent to [facility] at dental office

## 2023-07-21 NOTE — Telephone Encounter (Signed)
 Paper faxed to Dental facility. In media.

## 2023-07-23 ENCOUNTER — Ambulatory Visit: Payer: Medicare Other

## 2023-07-23 ENCOUNTER — Other Ambulatory Visit: Payer: Self-pay

## 2023-07-23 DIAGNOSIS — Z0000 Encounter for general adult medical examination without abnormal findings: Secondary | ICD-10-CM

## 2023-07-23 MED ORDER — NADOLOL 20 MG PO TABS: 20.0000 mg | ORAL_TABLET | Freq: Every day | ORAL | 5 refills | Status: DC

## 2023-07-23 NOTE — Progress Notes (Signed)
 Subjective:   [name] is a 43 y.o. who presents for a Medicare Wellness preventive visit.  Visit Complete: Virtual I connected with  [name] on 07/23/23 by a audio enabled telemedicine application and verified that I am speaking with the correct person using two identifiers.  Patient Location: Home  Provider Location: Home Office  I discussed the limitations of evaluation and management by telemedicine. The patient expressed understanding and agreed to proceed.  Vital Signs: Because this visit was a virtual/telehealth visit, some criteria may be missing or patient reported. Any vitals not documented were not able to be obtained and vitals that have been documented are patient reported.  VideoError- Librarian, academic were attempted between this provider and patient, however failed, due to patient having technical difficulties OR patient did not have access to video capability.  We continued and completed visit with audio only.   Persons Participating in Visit: Patient.  AWV Questionnaire: No: Patient Medicare AWV questionnaire was not completed prior to this visit.  Cardiac Risk Factors include: advanced age (>42men, >87 women);male gender     Objective:    Today's Vitals   07/23/23 0926  PainSc: 6    There is no height or weight on file to calculate BMI.     07/23/2023    9:34 AM 07/15/2022    6:30 AM 05/14/2022    8:46 AM 01/01/2022   12:25 PM 12/30/2021    3:55 PM 08/01/2021    8:25 AM 05/21/2021    9:11 AM  Advanced Directives  Does Patient Have a Medical Advance Directive? No No No No No No No  Would patient like information on creating a medical advance directive? No - Patient declined   No - Patient declined No - Guardian declined No - Patient declined No - Patient declined    Current Medications (verified) Outpatient Encounter Medications as of 07/23/2023  Medication Sig   Ferrous Sulfate (IRON) 325 (65 Fe) MG TABS Take 1  tablet (325 mg total) by mouth daily. (Patient taking differently: Take 325 mg by mouth daily with breakfast.)   folic acid (FOLVITE) 400 MCG tablet Take 400 mcg by mouth daily.   hydrocortisone cream 1 % Apply 1 application topically daily as needed for itching.   hydrOXYzine (VISTARIL) 25 MG capsule TAKE 1 CAPSULE(25 MG) BY MOUTH EVERY 8 HOURS AS NEEDED   lactulose (CHRONULAC) 10 GM/15ML solution TAKE 22. BY MOUTH IN THE MORNING AND AT BEDTIME   Magnesium 500 MG TABS Take 500 mg by mouth in the morning and at bedtime.   Multiple Vitamin (MULTIVITAMIN WITH MINERALS) TABS tablet Take 1 tablet by mouth daily. (Patient taking differently: Take 1 tablet by mouth daily with breakfast. Men)   nadolol (CORGARD) 20 MG tablet Take 1 tablet (20 mg total) by mouth daily. NEED AN OFFICE VISIT FOR FURTHER REFILLS.   oxymetazoline (AFRIN) 0.05 % nasal spray Place 1 spray into both nostrils 2 (two) times daily as needed for congestion.   pantoprazole (PROTONIX) 40 MG tablet Take 1 tablet (40 mg total) by mouth 2 (two) times daily before a meal.   sertraline (ZOLOFT) 100 MG tablet TAKE 1 TABLET(100 MG) BY MOUTH DAILY   thiamine (VITAMIN B-1) 100 MG tablet Take 100 mg by mouth daily.   No facility-administered encounter medications on file as of 07/23/2023.    Allergies (verified) Penicillins   History: Past Medical History:  Diagnosis Date   Anxiety    Phreesia 05/14/2020  Blood transfusion without reported diagnosis    Phreesia 05/14/2020   Cirrhosis of liver (HCC)    Depression    Phreesia 05/14/2020   Esophageal varices with hemorrhage (HCC)    ETOH abuse    GERD (gastroesophageal reflux disease)    Seizures (HCC)    Phreesia 05/14/2020   Substance abuse (HCC)    Phreesia 05/14/2020   Past Surgical History:  Procedure Laterality Date   ESOPHAGEAL BANDING  08/30/2019   Procedure: ESOPHAGEAL BANDING;  Surgeon: Kerin Salen, MD;  Location: [facility] ENDOSCOPY;  Service: Gastroenterology;;    ESOPHAGEAL BANDING  10/10/2019   Procedure: ESOPHAGEAL BANDING;  Surgeon: Kerin Salen, MD;  Location: [facility] ENDOSCOPY;  Service: Gastroenterology;;   ESOPHAGEAL BANDING N/A 12/23/2019   Procedure: ESOPHAGEAL BANDING;  Surgeon: Kathi Der, MD;  Location: [facility] ENDOSCOPY;  Service: Gastroenterology;  Laterality: N/A;   ESOPHAGEAL BANDING N/A 01/12/2020   Procedure: ESOPHAGEAL BANDING;  Surgeon: Kathi Der, MD;  Location: [facility] ENDOSCOPY;  Service: Gastroenterology;  Laterality: N/A;   ESOPHAGEAL BANDING N/A 09/10/2020   Procedure: ESOPHAGEAL BANDING;  Surgeon: Benancio Deeds, MD;  Location: [facility] ENDOSCOPY;  Service: Gastroenterology;  Laterality: N/A;   ESOPHAGEAL BANDING  04/16/2021   Procedure: ESOPHAGEAL BANDING;  Surgeon: Imogene Burn, MD;  Location: [facility] ENDOSCOPY;  Service: Gastroenterology;;   ESOPHAGEAL BANDING N/A 05/21/2021   Procedure: ESOPHAGEAL BANDING;  Surgeon: Rachael Fee, MD;  Location: [facility] ENDOSCOPY;  Service: Endoscopy;  Laterality: N/A;   ESOPHAGOGASTRODUODENOSCOPY Left 08/21/2015   Procedure: ESOPHAGOGASTRODUODENOSCOPY (EGD);  Surgeon: Charlott Rakes, MD;  Location: [facility] ENDOSCOPY;  Service: Endoscopy;  Laterality: Left;   ESOPHAGOGASTRODUODENOSCOPY N/A 12/05/2019   Procedure: ESOPHAGOGASTRODUODENOSCOPY (EGD);  Surgeon: Kathi Der, MD;  Location: [facility] ENDOSCOPY;  Service: Gastroenterology;  Laterality: N/A;   ESOPHAGOGASTRODUODENOSCOPY (EGD) WITH PROPOFOL N/A 08/30/2019   Procedure: ESOPHAGOGASTRODUODENOSCOPY (EGD) WITH PROPOFOL;  Surgeon: Kerin Salen, MD;  Location: [facility] ENDOSCOPY;  Service: Gastroenterology;  Laterality: N/A;   ESOPHAGOGASTRODUODENOSCOPY (EGD) WITH PROPOFOL N/A 10/10/2019   Procedure: ESOPHAGOGASTRODUODENOSCOPY (EGD) WITH PROPOFOL;  Surgeon: Kerin Salen, MD;  Location: [facility] ENDOSCOPY;  Service: Gastroenterology;  Laterality: N/A;   ESOPHAGOGASTRODUODENOSCOPY (EGD) WITH PROPOFOL N/A 12/23/2019   Procedure: ESOPHAGOGASTRODUODENOSCOPY (EGD) WITH PROPOFOL;   Surgeon: Kathi Der, MD;  Location: [facility] ENDOSCOPY;  Service: Gastroenterology;  Laterality: N/A;   ESOPHAGOGASTRODUODENOSCOPY (EGD) WITH PROPOFOL N/A 01/12/2020   Procedure: ESOPHAGOGASTRODUODENOSCOPY (EGD) WITH PROPOFOL;  Surgeon: Kathi Der, MD;  Location: [facility] ENDOSCOPY;  Service: Gastroenterology;  Laterality: N/A;   ESOPHAGOGASTRODUODENOSCOPY (EGD) WITH PROPOFOL N/A 09/10/2020   Procedure: ESOPHAGOGASTRODUODENOSCOPY (EGD) WITH PROPOFOL;  Surgeon: Benancio Deeds, MD;  Location: [facility] ENDOSCOPY;  Service: Gastroenterology;  Laterality: N/A;   ESOPHAGOGASTRODUODENOSCOPY (EGD) WITH PROPOFOL N/A 04/16/2021   Procedure: ESOPHAGOGASTRODUODENOSCOPY (EGD) WITH PROPOFOL;  Surgeon: Imogene Burn, MD;  Location: [facility] ENDOSCOPY;  Service: Gastroenterology;  Laterality: N/A;   ESOPHAGOGASTRODUODENOSCOPY (EGD) WITH PROPOFOL N/A 05/21/2021   Procedure: ESOPHAGOGASTRODUODENOSCOPY (EGD) WITH PROPOFOL;  Surgeon: Rachael Fee, MD;  Location: [facility] ENDOSCOPY;  Service: Endoscopy;  Laterality: N/A;   ESOPHAGOGASTRODUODENOSCOPY (EGD) WITH PROPOFOL N/A 08/01/2021   Procedure: ESOPHAGOGASTRODUODENOSCOPY (EGD) WITH PROPOFOL;  Surgeon: Benancio Deeds, MD;  Location: [facility] ENDOSCOPY;  Service: Gastroenterology;  Laterality: N/A;   ESOPHAGOGASTRODUODENOSCOPY (EGD) WITH PROPOFOL N/A 01/01/2022   Procedure: ESOPHAGOGASTRODUODENOSCOPY (EGD) WITH PROPOFOL;  Surgeon: Meryl Dare, MD;  Location: [facility] ENDOSCOPY;  Service: Gastroenterology;  Laterality: N/A;   ESOPHAGOGASTRODUODENOSCOPY (EGD) WITH PROPOFOL N/A 07/15/2022   Procedure: ESOPHAGOGASTRODUODENOSCOPY (EGD) WITH PROPOFOL;  Surgeon: Benancio Deeds, MD;  Location: [facility]  ENDOSCOPY;  Service: Gastroenterology;  Laterality: N/A;   GASTRIC VARICES BANDING  12/05/2019   Procedure: GASTRIC VARICES BANDING;  Surgeon: Kathi Der, MD;  Location: [facility] ENDOSCOPY;  Service: Gastroenterology;;  placed 6 bands   HOT HEMOSTASIS N/A 12/05/2019   Procedure: HOT HEMOSTASIS  (ARGON PLASMA COAGULATION/BICAP);  Surgeon: Kathi Der, MD;  Location: [facility] ENDOSCOPY;  Service: Gastroenterology;  Laterality: N/A;   IR RADIOLOGIST EVAL & MGMT  12/13/2019   Family History  Problem Relation Age of Onset   Other Mother        pre-diabetes   Breast cancer Mother    Cancer Mother    Diabetes Mother    Obesity Mother    COPD Father        smoker   Meniere's disease Father    Depression Father    Hearing loss Father    Stroke Maternal Grandmother    Heart attack Maternal Grandfather    Stroke Paternal Grandfather    Crohn's disease Maternal Uncle    Heart attack Maternal Uncle    Peptic Ulcer Paternal Aunt    Social History   Socioeconomic History   Marital status: Single    Spouse name: Not on file   Number of children: 0   Years of education: Not on file   Highest education level: Some college, no degree  Occupational History   Not on file  Tobacco Use   Smoking status: Former    Current packs/day: 0.00    Average packs/day: 1 pack/day for 5.0 years (5.0 ttl pk-yrs)    Types: Cigarettes    Quit date: 07/27/2013    Years since quitting: 9.9   Smokeless tobacco: Never  Vaping Use   Vaping status: Never Used  Substance and Sexual Activity   Alcohol use: Not Currently    Alcohol/week: 3.0 standard drinks of alcohol    Comment: Alcoholism   Drug use: Not Currently    Frequency: 7.0 times per week    Types: Marijuana   Sexual activity: Not Currently    Birth control/protection: Abstinence, Condom  Other Topics Concern   Not on file  Social History Narrative   Not on file   Social Drivers of Health   Financial Resource Strain: Low Risk  (07/23/2023)   Overall Financial Resource Strain (CARDIA)    Difficulty of Paying Living Expenses: Not hard at all  Recent Concern: Financial Resource Strain - Medium Risk (07/17/2023)   Overall Financial Resource Strain (CARDIA)    Difficulty of Paying Living Expenses: Somewhat hard  Food Insecurity: No Food  Insecurity (07/23/2023)   Hunger Vital Sign    Worried About Running Out of Food in the Last Year: Never true    Ran Out of Food in the Last Year: Never true  Recent Concern: Food Insecurity - Food Insecurity Present (07/17/2023)   Hunger Vital Sign    Worried About Running Out of Food in the Last Year: Sometimes true    Ran Out of Food in the Last Year: Sometimes true  Transportation Needs: No Transportation Needs (07/23/2023)   PRAPARE - Administrator, Civil Service (Medical): No    Lack of Transportation (Non-Medical): No  Recent Concern: Transportation Needs - Unmet Transportation Needs (07/17/2023)   PRAPARE - Transportation    Lack of Transportation (Medical): Yes    Lack of Transportation (Non-Medical): Yes  Physical Activity: Sufficiently Active (07/23/2023)   Exercise Vital Sign    Days of Exercise per Week: 7 days  Minutes of Exercise per Session: 60 min  Stress: Stress Concern Present (07/23/2023)   [employer] of Occupational Health - Occupational Stress Questionnaire    Feeling of Stress : To some extent  Social Connections: Socially Isolated (07/23/2023)   Social Connection and Isolation Panel [NHANES]    Frequency of Communication with Friends and Family: More than three times a week    Frequency of Social Gatherings with Friends and Family: Three times a week    Attends Religious Services: Never    Active Member of Clubs or Organizations: No    Attends Engineer, structural: Never    Marital Status: Never married    Tobacco Counseling Counseling given: Not Answered    Clinical Intake:  Pre-visit preparation completed: Yes  Pain : 0-10 Pain Score: 6  Pain Type: Acute pain Pain Location: Mouth Pain Descriptors / Indicators: Aching Pain Onset: 1 to 4 weeks ago Pain Frequency: Constant     Nutritional Risks: Nausea/ vomitting/ diarrhea (diarrhea due lactose) Diabetes: No  Lab Results  Component Value Date   HGBA1C 5.2  07/20/2023   HGBA1C 5.2 02/10/2023   HGBA1C 5.0 06/18/2020     How often do you need to have someone help you when you read instructions, pamphlets, or other written materials from your doctor or pharmacy?: 1 - Never  Interpreter Needed?: No  Information entered by :: NAllen LPN   Activities of Daily Living     07/23/2023    9:28 AM  In your present state of health, do you have any difficulty performing the following activities:  Hearing? 0  Vision? 0  Difficulty concentrating or making decisions? 1  Comment severe memory issues  Walking or climbing stairs? 0  Dressing or bathing? 0  Doing errands, shopping? 0  Preparing Food and eating ? N  Using the Toilet? N  In the past six months, have you accidently leaked urine? N  Do you have problems with loss of bowel control? N  Managing your Medications? N  Managing your Finances? N  Housekeeping or managing your Housekeeping? N    Patient Care Team: Rema Fendt, NP as PCP - General (Nurse Practitioner)  Indicate any recent Medical Services you may have received from other than [facility] providers in the past year (date may be approximate).     Assessment:   This is a routine wellness examination for Baruch.  Hearing/Vision screen Hearing Screening - Comments:: Denies hearing issues Vision Screening - Comments:: Regular eye exams, [facility]   Goals Addressed             This Visit's Progress    Patient Stated       07/23/2023, denies goals       Depression Screen     07/23/2023    9:35 AM 02/10/2023    9:50 AM 11/21/2022    8:51 AM 05/14/2022    8:47 AM 12/24/2021    8:22 AM 08/27/2021    8:09 AM 07/30/2021    2:36 PM  PHQ 2/9 Scores  PHQ - 2 Score 1 0 0 0 6 1 2   PHQ- 9 Score 1  4  22 3 4     Fall Risk     07/23/2023    9:34 AM 07/20/2023    8:08 AM 02/10/2023    9:49 AM 11/21/2022    8:51 AM 05/14/2022    8:46 AM  Fall Risk   Falls in the past year? 0 0 0 0 0  Number falls in past yr: 0 0 0 0 0   Injury with Fall? 0 0 0 0 0  Risk for fall due to : Medication side effect No Fall Risks No Fall Risks No Fall Risks No Fall Risks;Medication side effect  Follow up Falls prevention discussed;Falls evaluation completed Falls evaluation completed   Falls prevention discussed;Education provided;Falls evaluation completed    MEDICARE RISK AT HOME:  Medicare Risk at Home Any stairs in or around the home?: Yes If so, are there any without handrails?: No Home free of loose throw rugs in walkways, pet beds, electrical cords, etc?: Yes Adequate lighting in your home to reduce risk of falls?: Yes Life alert?: No Use of a cane, walker or w/c?: No Grab bars in the bathroom?: Yes Shower chair or bench in shower?: No Elevated toilet seat or a handicapped toilet?: No  TIMED UP AND GO:  Was the test performed?  No  Cognitive Function: 6CIT completed        07/23/2023    9:36 AM 05/14/2022    8:49 AM  6CIT Screen  What Year? 0 points 0 points  What month? 0 points 0 points  What time? 0 points 0 points  Count back from 20 0 points 0 points  Months in reverse 0 points 0 points  Repeat phrase 4 points 4 points  Total Score 4 points 4 points    Immunizations Immunization History  Administered Date(s) Administered   Hep A / Hep B 08/25/2016, 09/24/2016, 02/25/2017   Influenza Split 02/17/2008, 02/05/2010, 04/02/2011, 03/10/2012, 01/28/2013   Influenza, Seasonal, Injecte, Preservative Fre 02/10/2023   Influenza,inj,Quad PF,6+ Mos 02/14/2016, 03/05/2017, 05/16/2020   Influenza,inj,quad, With Preservative 12/27/2013   Influenza-Unspecified 02/13/2016   PFIZER(Purple Top)SARS-COV-2 Vaccination 07/27/2019, 08/17/2019, 04/17/2020   Pneumococcal Polysaccharide-23 12/09/2017   Td 12/05/2002   Tdap 09/01/2016, 12/22/2019    Screening Tests Health Maintenance  Topic Date Due   Pneumococcal Vaccine 24-41 Years old (2 of 2 - PCV) 12/10/2018   COVID-19 Vaccine (4 - 2024-25 season) 12/28/2022    DTaP/Tdap/Td (4 - Td or Tdap) 12/21/2029   INFLUENZA VACCINE  Completed   Hepatitis C Screening  Completed   HIV Screening  Completed   HPV VACCINES  Aged Out    Health Maintenance  Health Maintenance Due  Topic Date Due   Pneumococcal Vaccine 54-73 Years old (2 of 2 - PCV) 12/10/2018   COVID-19 Vaccine (4 - 2024-25 season) 12/28/2022   Health Maintenance Items Addressed: Covid and pneumonia vaccines due  Additional Screening:  Vision Screening: Recommended annual ophthalmology exams for early detection of glaucoma and other disorders of the eye.  Dental Screening: Recommended annual dental exams for proper oral hygiene  Community Resource Referral / Chronic Care Management: CRR required this visit?  No   CCM required this visit?  No     Plan:     I have personally reviewed and noted the following in the patient's chart:   Medical and social history Use of alcohol, tobacco or illicit drugs  Current medications and supplements including opioid prescriptions. Patient is not currently taking opioid prescriptions. Functional ability and status Nutritional status Physical activity Advanced directives List of other physicians Hospitalizations, surgeries, and ER visits in previous 12 months Vitals Screenings to include cognitive, depression, and falls Referrals and appointments  In addition, I have reviewed and discussed with patient certain preventive protocols, quality metrics, and best practice recommendations. A written personalized care plan for preventive services as well as general preventive  health recommendations were provided to patient.     Barb Merino, LPN   08/04/8117   After Visit Summary: (MyChart) Due to this being a telephonic visit, the after visit summary with patients personalized plan was offered to patient via MyChart   Notes: Nothing significant to report at this time.

## 2023-07-23 NOTE — Patient Instructions (Signed)
 [name] , Thank you for taking time to come for your Medicare Wellness Visit. I appreciate your ongoing commitment to your health goals. Please review the following plan we discussed and let me know if I can assist you in the future.   Referrals/Orders/Follow-Ups/Clinician Recommendations: none  This is a list of the screening recommended for you and due dates:  Health Maintenance  Topic Date Due   Pneumococcal Vaccination (2 of 2 - PCV) 12/10/2018   COVID-19 Vaccine (4 - 2024-25 season) 12/28/2022   DTaP/Tdap/Td vaccine (4 - Td or Tdap) 12/21/2029   Flu Shot  Completed   Hepatitis C Screening  Completed   HIV Screening  Completed   HPV Vaccine  Aged Out    Advanced directives: (ACP Link)Information on Advanced Care Planning can be found at [facility] Advance Health Care Directives Advance Health Care Directives. [email or web address]   Next Medicare Annual Wellness Visit scheduled for next year: Yes  insert Preventive Care attachment Insert FALL PREVENTION attachment if needed

## 2023-08-02 ENCOUNTER — Encounter: Payer: Self-pay | Admitting: Certified Registered Nurse Anesthetist

## 2023-08-05 ENCOUNTER — Ambulatory Visit: Admitting: Gastroenterology

## 2023-08-05 ENCOUNTER — Encounter: Payer: Self-pay | Admitting: Gastroenterology

## 2023-08-05 VITALS — BP 122/85 | HR 66 | Temp 97.8°F | Resp 13 | Ht 67.0 in | Wt 144.0 lb

## 2023-08-05 DIAGNOSIS — K766 Portal hypertension: Secondary | ICD-10-CM | POA: Diagnosis not present

## 2023-08-05 DIAGNOSIS — K219 Gastro-esophageal reflux disease without esophagitis: Secondary | ICD-10-CM

## 2023-08-05 DIAGNOSIS — K3189 Other diseases of stomach and duodenum: Secondary | ICD-10-CM

## 2023-08-05 DIAGNOSIS — K317 Polyp of stomach and duodenum: Secondary | ICD-10-CM | POA: Diagnosis not present

## 2023-08-05 DIAGNOSIS — K7460 Unspecified cirrhosis of liver: Secondary | ICD-10-CM

## 2023-08-05 DIAGNOSIS — K449 Diaphragmatic hernia without obstruction or gangrene: Secondary | ICD-10-CM

## 2023-08-05 DIAGNOSIS — I8500 Esophageal varices without bleeding: Secondary | ICD-10-CM | POA: Diagnosis not present

## 2023-08-05 DIAGNOSIS — D132 Benign neoplasm of duodenum: Secondary | ICD-10-CM | POA: Diagnosis not present

## 2023-08-05 DIAGNOSIS — K2980 Duodenitis without bleeding: Secondary | ICD-10-CM

## 2023-08-05 MED ORDER — SODIUM CHLORIDE 0.9 % IV SOLN: 500.0000 mL | INTRAVENOUS | Status: DC

## 2023-08-05 NOTE — Op Note (Signed)
 [facility] Patient Name: [name] Procedure Date: 08/05/2023 10:04 AM MRN: [id number] Endoscopist: Viviann Spare P. Adela Lank , MD, [id number] Age: 43 Referring MD:  Date of Birth: [birth year] Gender: Male Account #: [id number] Procedure:                Upper GI endoscopy Indications:              Cirrhosis - history of esophageal varices with                            bleeding in the past s/p banding, on nadolol. Here                            for surveillance, ensure no interval changes since                            the last exam Medicines:                Monitored Anesthesia Care Procedure:                Pre-Anesthesia Assessment:                           - Prior to the procedure, a History and Physical                            was performed, and patient medications and                            allergies were reviewed. The patient's tolerance of                            previous anesthesia was also reviewed. The risks                            and benefits of the procedure and the sedation                            options and risks were discussed with the patient.                            All questions were answered, and informed consent                            was obtained. Prior Anticoagulants: The patient has                            taken no anticoagulant or antiplatelet agents. ASA                            Grade Assessment: III - A patient with severe                            systemic disease. After reviewing the risks and  benefits, the patient was deemed in satisfactory                            condition to undergo the procedure.                           After obtaining informed consent, the endoscope was                            passed under direct vision. Throughout the                            procedure, the patient's blood pressure, pulse, and                            oxygen saturations were  monitored continuously. The                            GIF [id number] #[id number] was introduced through the                            mouth, and advanced to the second part of duodenum.                            The upper GI endoscopy was accomplished without                            difficulty. The patient tolerated the procedure                            well. Scope In: Scope Out: Findings:                 Esophagogastric landmarks were identified: the                            Z-line was found at 36 cm, the gastroesophageal                            junction was found at 36 cm and the upper extent of                            the gastric folds was found at 38 cm from the                            incisors.                           A 2 cm hiatal hernia was present.                           Grade I varices were found in the distal esophagus.                            They were small  in size. No high risk stigmata for                            bleeding noted.                           The exam of the esophagus was otherwise normal.                           Mild portal hypertensive gastropathy was found in                            the gastric fundus and in the gastric body.                           The exam of the stomach was otherwise normal. No                            varices.                           A single diminutive sessile polyp was found in the                            second portion of the duodenum (just proximal to                            the ampulla). The polyp was removed with a cold                            biopsy forceps. Resection and retrieval were                            complete.                           The exam of the duodenum was otherwise normal. Complications:            No immediate complications. Estimated blood loss:                            Minimal. Estimated Blood Loss:     Estimated blood loss was minimal. Impression:                - Esophagogastric landmarks identified.                           - 2 cm hiatal hernia.                           - Grade I esophageal varices.                           - Normal esophagus otherwise.                           - Portal  hypertensive gastropathy.                           - A single duodenal polyp. Resected and retrieved.                           - Normal stomach / duodenum otherwise. Recommendation:           - Patient has a contact number available for                            emergencies. The signs and symptoms of potential                            delayed complications were discussed with the                            patient. Return to normal activities tomorrow.                            Written discharge instructions were provided to the                            patient.                           - Resume previous diet.                           - Continue present medications including nadolol                           - Await pathology results. Viviann Spare P. [name], MD 08/05/2023 10:31:38 AM This report has been signed electronically.

## 2023-08-05 NOTE — Progress Notes (Signed)
 Report given to PACU, vss

## 2023-08-05 NOTE — Patient Instructions (Signed)

## 2023-08-05 NOTE — Progress Notes (Signed)
 [facility] History and Physical   Primary Care Physician:  Rema Fendt, NP   Reason for Procedure:   Cirrhosis - history of varices  Plan:    EGD     HPI: [name] is a 43 y.o. male  here for EGD to further evaluate esophageal varices. Has had bleeding / banding in the past, on nadolol, has had low risk varices for last several exams. Here for surveillance to ensure no high risk stigmata. On nadolol.     I have discussed risks / benefits of anesthesia and endoscopic procedure with [name] and they wish to proceed with the exams as outlined today.    Past Medical History:  Diagnosis Date   Anxiety    Phreesia 05/14/2020   Blood transfusion without reported diagnosis    Phreesia 05/14/2020   Cirrhosis of liver (HCC)    Depression    Phreesia 05/14/2020   Esophageal varices with hemorrhage (HCC)    ETOH abuse    GERD (gastroesophageal reflux disease)    Seizures (HCC)    Phreesia 05/14/2020   Substance abuse (HCC)    Phreesia 05/14/2020    Past Surgical History:  Procedure Laterality Date   ESOPHAGEAL BANDING  08/30/2019   Procedure: ESOPHAGEAL BANDING;  Surgeon: Kerin Salen, MD;  Location: [facility] ENDOSCOPY;  Service: Gastroenterology;;   ESOPHAGEAL BANDING  10/10/2019   Procedure: ESOPHAGEAL BANDING;  Surgeon: Kerin Salen, MD;  Location: [facility] ENDOSCOPY;  Service: Gastroenterology;;   ESOPHAGEAL BANDING N/A 12/23/2019   Procedure: ESOPHAGEAL BANDING;  Surgeon: Kathi Der, MD;  Location: [facility] ENDOSCOPY;  Service: Gastroenterology;  Laterality: N/A;   ESOPHAGEAL BANDING N/A 01/12/2020   Procedure: ESOPHAGEAL BANDING;  Surgeon: Kathi Der, MD;  Location: [facility] ENDOSCOPY;  Service: Gastroenterology;  Laterality: N/A;   ESOPHAGEAL BANDING N/A 09/10/2020   Procedure: ESOPHAGEAL BANDING;  Surgeon: Benancio Deeds, MD;  Location: [facility] ENDOSCOPY;  Service: Gastroenterology;  Laterality: N/A;   ESOPHAGEAL BANDING  04/16/2021   Procedure:  ESOPHAGEAL BANDING;  Surgeon: Imogene Burn, MD;  Location: [facility] ENDOSCOPY;  Service: Gastroenterology;;   ESOPHAGEAL BANDING N/A 05/21/2021   Procedure: ESOPHAGEAL BANDING;  Surgeon: Rachael Fee, MD;  Location: [facility] ENDOSCOPY;  Service: Endoscopy;  Laterality: N/A;   ESOPHAGOGASTRODUODENOSCOPY Left 08/21/2015   Procedure: ESOPHAGOGASTRODUODENOSCOPY (EGD);  Surgeon: Charlott Rakes, MD;  Location: [facility] ENDOSCOPY;  Service: Endoscopy;  Laterality: Left;   ESOPHAGOGASTRODUODENOSCOPY N/A 12/05/2019   Procedure: ESOPHAGOGASTRODUODENOSCOPY (EGD);  Surgeon: Kathi Der, MD;  Location: [facility] ENDOSCOPY;  Service: Gastroenterology;  Laterality: N/A;   ESOPHAGOGASTRODUODENOSCOPY (EGD) WITH PROPOFOL N/A 08/30/2019   Procedure: ESOPHAGOGASTRODUODENOSCOPY (EGD) WITH PROPOFOL;  Surgeon: Kerin Salen, MD;  Location: [facility] ENDOSCOPY;  Service: Gastroenterology;  Laterality: N/A;   ESOPHAGOGASTRODUODENOSCOPY (EGD) WITH PROPOFOL N/A 10/10/2019   Procedure: ESOPHAGOGASTRODUODENOSCOPY (EGD) WITH PROPOFOL;  Surgeon: Kerin Salen, MD;  Location: [facility] ENDOSCOPY;  Service: Gastroenterology;  Laterality: N/A;   ESOPHAGOGASTRODUODENOSCOPY (EGD) WITH PROPOFOL N/A 12/23/2019   Procedure: ESOPHAGOGASTRODUODENOSCOPY (EGD) WITH PROPOFOL;  Surgeon: Kathi Der, MD;  Location: [facility] ENDOSCOPY;  Service: Gastroenterology;  Laterality: N/A;   ESOPHAGOGASTRODUODENOSCOPY (EGD) WITH PROPOFOL N/A 01/12/2020   Procedure: ESOPHAGOGASTRODUODENOSCOPY (EGD) WITH PROPOFOL;  Surgeon: Kathi Der, MD;  Location: [facility] ENDOSCOPY;  Service: Gastroenterology;  Laterality: N/A;   ESOPHAGOGASTRODUODENOSCOPY (EGD) WITH PROPOFOL N/A 09/10/2020   Procedure: ESOPHAGOGASTRODUODENOSCOPY (EGD) WITH PROPOFOL;  Surgeon: Benancio Deeds, MD;  Location: [facility] ENDOSCOPY;  Service: Gastroenterology;  Laterality: N/A;   ESOPHAGOGASTRODUODENOSCOPY (EGD) WITH PROPOFOL N/A 04/16/2021   Procedure: ESOPHAGOGASTRODUODENOSCOPY (EGD) WITH  PROPOFOL;  Surgeon: Imogene Burn,  MD;  Location: [facility] ENDOSCOPY;  Service: Gastroenterology;  Laterality: N/A;   ESOPHAGOGASTRODUODENOSCOPY (EGD) WITH PROPOFOL N/A 05/21/2021   Procedure: ESOPHAGOGASTRODUODENOSCOPY (EGD) WITH PROPOFOL;  Surgeon: Rachael Fee, MD;  Location: [facility] ENDOSCOPY;  Service: Endoscopy;  Laterality: N/A;   ESOPHAGOGASTRODUODENOSCOPY (EGD) WITH PROPOFOL N/A 08/01/2021   Procedure: ESOPHAGOGASTRODUODENOSCOPY (EGD) WITH PROPOFOL;  Surgeon: Benancio Deeds, MD;  Location: [facility] ENDOSCOPY;  Service: Gastroenterology;  Laterality: N/A;   ESOPHAGOGASTRODUODENOSCOPY (EGD) WITH PROPOFOL N/A 01/01/2022   Procedure: ESOPHAGOGASTRODUODENOSCOPY (EGD) WITH PROPOFOL;  Surgeon: Meryl Dare, MD;  Location: [facility] ENDOSCOPY;  Service: Gastroenterology;  Laterality: N/A;   ESOPHAGOGASTRODUODENOSCOPY (EGD) WITH PROPOFOL N/A 07/15/2022   Procedure: ESOPHAGOGASTRODUODENOSCOPY (EGD) WITH PROPOFOL;  Surgeon: Benancio Deeds, MD;  Location: [facility] ENDOSCOPY;  Service: Gastroenterology;  Laterality: N/A;   GASTRIC VARICES BANDING  12/05/2019   Procedure: GASTRIC VARICES BANDING;  Surgeon: Kathi Der, MD;  Location: [facility] ENDOSCOPY;  Service: Gastroenterology;;  placed 6 bands   HOT HEMOSTASIS N/A 12/05/2019   Procedure: HOT HEMOSTASIS (ARGON PLASMA COAGULATION/BICAP);  Surgeon: Kathi Der, MD;  Location: [facility] ENDOSCOPY;  Service: Gastroenterology;  Laterality: N/A;   IR RADIOLOGIST EVAL & MGMT  12/13/2019    Prior to Admission medications   Medication Sig Start Date End Date Taking? Authorizing Provider  Ferrous Sulfate (IRON) 325 (65 Fe) MG TABS Take 1 tablet (325 mg total) by mouth daily. Patient taking differently: Take 325 mg by mouth daily with breakfast. 09/02/19  Yes Regalado, Belkys A, MD  folic acid (FOLVITE) 400 MCG tablet Take 400 mcg by mouth daily.   Yes [provider]  lactulose (CHRONULAC) 10 GM/15ML solution TAKE 22. BY MOUTH IN THE MORNING AND AT BEDTIME 06/09/23  Yes Doree Albee, PA-C   Magnesium 500 MG TABS Take 500 mg by mouth in the morning and at bedtime.   Yes [provider]  Multiple Vitamin (MULTIVITAMIN WITH MINERALS) TABS tablet Take 1 tablet by mouth daily. Patient taking differently: Take 1 tablet by mouth daily with breakfast. Men 07/19/19  Yes Johnson, Clanford L, MD  nadolol (CORGARD) 20 MG tablet Take 1 tablet (20 mg total) by mouth daily. 07/23/23  Yes Harvel Meskill, Willaim Rayas, MD  pantoprazole (PROTONIX) 40 MG tablet Take 1 tablet (40 mg total) by mouth 2 (two) times daily before a meal. 02/10/23  Yes Zonia Kief, Amy J, NP  sertraline (ZOLOFT) 100 MG tablet TAKE 1 TABLET(100 MG) BY MOUTH DAILY 05/25/23  Yes Zonia Kief, Amy J, NP  thiamine (VITAMIN B-1) 100 MG tablet Take 100 mg by mouth daily.   Yes [provider]  hydrocortisone cream 1 % Apply 1 application topically daily as needed for itching.    [provider]  hydrOXYzine (VISTARIL) 25 MG capsule TAKE 1 CAPSULE(25 MG) BY MOUTH EVERY 8 HOURS AS NEEDED Patient not taking: Reported on 08/05/2023 04/11/22   Rema Fendt, NP  oxymetazoline (AFRIN) 0.05 % nasal spray Place 1 spray into both nostrils 2 (two) times daily as needed for congestion.    [provider]    Current Outpatient Medications  Medication Sig Dispense Refill   Ferrous Sulfate (IRON) 325 (65 Fe) MG TABS Take 1 tablet (325 mg total) by mouth daily. (Patient taking differently: Take 325 mg by mouth daily with breakfast.) 20 tablet 0   folic acid (FOLVITE) 400 MCG tablet Take 400 mcg by mouth daily.     lactulose (CHRONULAC) 10 GM/15ML solution TAKE 22. BY MOUTH IN THE  MORNING AND AT BEDTIME 1892 mL 2   Magnesium 500 MG TABS Take 500 mg by mouth in the morning and at bedtime.     Multiple Vitamin (MULTIVITAMIN WITH MINERALS) TABS tablet Take 1 tablet by mouth daily. (Patient taking differently: Take 1 tablet by mouth daily with breakfast. Men)     nadolol (CORGARD) 20 MG tablet Take 1 tablet (20 mg total) by  mouth daily. 30 tablet 5   pantoprazole (PROTONIX) 40 MG tablet Take 1 tablet (40 mg total) by mouth 2 (two) times daily before a meal. 60 tablet 5   sertraline (ZOLOFT) 100 MG tablet TAKE 1 TABLET(100 MG) BY MOUTH DAILY 90 tablet 0   thiamine (VITAMIN B-1) 100 MG tablet Take 100 mg by mouth daily.     hydrocortisone cream 1 % Apply 1 application topically daily as needed for itching.     hydrOXYzine (VISTARIL) 25 MG capsule TAKE 1 CAPSULE(25 MG) BY MOUTH EVERY 8 HOURS AS NEEDED (Patient not taking: Reported on 08/05/2023) 30 capsule 2   oxymetazoline (AFRIN) 0.05 % nasal spray Place 1 spray into both nostrils 2 (two) times daily as needed for congestion.     Current Facility-Administered Medications  Medication Dose Route Frequency Provider Last Rate Last Admin   0.9 %  sodium chloride infusion  500 mL Intravenous Continuous Ziquan Fidel, Willaim Rayas, MD        Allergies as of 08/05/2023 - Review Complete 08/05/2023  Allergen Reaction Noted   Penicillins Hives and Other (See Comments) 05/26/2018    Family History  Problem Relation Age of Onset   Other Mother        pre-diabetes   Breast cancer Mother    Cancer Mother    Diabetes Mother    Obesity Mother    COPD Father        smoker   Meniere's disease Father    Depression Father    Hearing loss Father    Stroke Maternal Grandmother    Heart attack Maternal Grandfather    Stroke Paternal Grandfather    Crohn's disease Maternal Uncle    Heart attack Maternal Uncle    Peptic Ulcer Paternal Aunt     Social History   Socioeconomic History   Marital status: Single    Spouse name: Not on file   Number of children: 0   Years of education: Not on file   Highest education level: Some college, no degree  Occupational History   Not on file  Tobacco Use   Smoking status: Former    Current packs/day: 0.00    Average packs/day: 1 pack/day for 5.0 years (5.0 ttl pk-yrs)    Types: Cigarettes    Quit date: 07/27/2013    Years since  quitting: 10.0   Smokeless tobacco: Never  Vaping Use   Vaping status: Never Used  Substance and Sexual Activity   Alcohol use: Not Currently    Alcohol/week: 3.0 standard drinks of alcohol    Comment: Alcoholism   Drug use: Not Currently    Frequency: 7.0 times per week    Types: Marijuana   Sexual activity: Not Currently    Birth control/protection: Abstinence, Condom  Other Topics Concern   Not on file  Social History Narrative   Not on file   Social Drivers of Health   Financial Resource Strain: Low Risk  (07/23/2023)   Overall Financial Resource Strain (CARDIA)    Difficulty of Paying Living Expenses: Not hard at all  Recent Concern: Financial  Resource Strain - Medium Risk (07/17/2023)   Overall Financial Resource Strain (CARDIA)    Difficulty of Paying Living Expenses: Somewhat hard  Food Insecurity: No Food Insecurity (07/23/2023)   Hunger Vital Sign    Worried About Running Out of Food in the Last Year: Never true    Ran Out of Food in the Last Year: Never true  Recent Concern: Food Insecurity - Food Insecurity Present (07/17/2023)   Hunger Vital Sign    Worried About Running Out of Food in the Last Year: Sometimes true    Ran Out of Food in the Last Year: Sometimes true  Transportation Needs: No Transportation Needs (07/23/2023)   PRAPARE - Administrator, Civil Service (Medical): No    Lack of Transportation (Non-Medical): No  Recent Concern: Transportation Needs - Unmet Transportation Needs (07/17/2023)   PRAPARE - Transportation    Lack of Transportation (Medical): Yes    Lack of Transportation (Non-Medical): Yes  Physical Activity: Sufficiently Active (07/23/2023)   Exercise Vital Sign    Days of Exercise per Week: 7 days    Minutes of Exercise per Session: 60 min  Stress: Stress Concern Present (07/23/2023)   [employer] of Occupational Health - Occupational Stress Questionnaire    Feeling of Stress : To some extent  Social Connections: Socially  Isolated (07/23/2023)   Social Connection and Isolation Panel [NHANES]    Frequency of Communication with Friends and Family: More than three times a week    Frequency of Social Gatherings with Friends and Family: Three times a week    Attends Religious Services: Never    Active Member of Clubs or Organizations: No    Attends Banker Meetings: Never    Marital Status: Never married  Intimate Partner Violence: Not At Risk (07/23/2023)   Humiliation, Afraid, Rape, and Kick questionnaire    Fear of Current or Ex-Partner: No    Emotionally Abused: No    Physically Abused: No    Sexually Abused: No    Review of Systems: All other review of systems negative except as mentioned in the HPI.  Physical Exam: Vital signs BP 116/70   Pulse 60   Temp 97.8 F (36.6 C)   Ht 5\' 7"  (1.702 m)   Wt 144 lb (65.3 kg)   SpO2 98%   BMI 22.55 kg/m   General:   Alert,  Well-developed, pleasant and cooperative in NAD Lungs:  Clear throughout to auscultation.   Heart:  Regular rate and rhythm Abdomen:  Soft, nontender and nondistended.   Neuro/Psych:  Alert and cooperative. Normal mood and affect. A and O x 3  Harlin Rain, MD [facility]

## 2023-08-05 NOTE — Progress Notes (Signed)
 Called to room to assist during endoscopic procedure.  Patient ID and intended procedure confirmed with present staff. Received instructions for my participation in the procedure from the performing physician.

## 2023-08-05 NOTE — Progress Notes (Signed)
1013 Robinul 0.1 mg IV given due large amount of secretions upon assessment.  MD made aware, vss  ?

## 2023-08-06 ENCOUNTER — Telehealth: Payer: Self-pay

## 2023-08-06 NOTE — Telephone Encounter (Signed)
  Follow up Call-     08/05/2023    9:17 AM  Call back number  Post procedure Call Back phone  # [phone]  Permission to leave phone message Yes     Patient questions:  Do you have a fever, pain , or abdominal swelling? No. Pain Score  0 *  Have you tolerated food without any problems? Yes.    Have you been able to return to your normal activities? Yes.    Do you have any questions about your discharge instructions: Diet   No. Medications  No. Follow up visit  No.  Do you have questions or concerns about your Care? No.  Actions: * If pain score is 4 or above: No action needed, pain <4.

## 2023-08-07 ENCOUNTER — Encounter: Payer: Self-pay | Admitting: Gastroenterology

## 2023-08-07 LAB — SURGICAL PATHOLOGY

## 2023-08-17 ENCOUNTER — Encounter (HOSPITAL_COMMUNITY): Payer: Self-pay

## 2023-08-17 ENCOUNTER — Ambulatory Visit (HOSPITAL_COMMUNITY): Admit: 2023-08-17 | Admitting: Gastroenterology

## 2023-08-17 SURGERY — EGD (ESOPHAGOGASTRODUODENOSCOPY)
Anesthesia: Monitor Anesthesia Care

## 2023-08-21 ENCOUNTER — Other Ambulatory Visit: Payer: Self-pay | Admitting: Family

## 2023-08-21 DIAGNOSIS — F419 Anxiety disorder, unspecified: Secondary | ICD-10-CM

## 2023-08-21 NOTE — Telephone Encounter (Signed)
 Complete

## 2023-10-12 ENCOUNTER — Other Ambulatory Visit: Payer: Self-pay | Admitting: Physician Assistant

## 2023-11-17 ENCOUNTER — Other Ambulatory Visit: Payer: Self-pay

## 2023-11-17 ENCOUNTER — Other Ambulatory Visit: Payer: Self-pay | Admitting: Family

## 2023-11-17 DIAGNOSIS — F419 Anxiety disorder, unspecified: Secondary | ICD-10-CM

## 2023-11-17 DIAGNOSIS — I8501 Esophageal varices with bleeding: Secondary | ICD-10-CM

## 2023-11-17 MED ORDER — PANTOPRAZOLE SODIUM 40 MG PO TBEC: 40.0000 mg | DELAYED_RELEASE_TABLET | Freq: Two times a day (BID) | ORAL | 5 refills | Status: AC

## 2023-11-17 NOTE — Telephone Encounter (Signed)
 Complete

## 2024-02-09 ENCOUNTER — Other Ambulatory Visit: Payer: Self-pay | Admitting: Gastroenterology

## 2024-02-09 DIAGNOSIS — K7682 Hepatic encephalopathy: Secondary | ICD-10-CM

## 2024-02-09 MED ORDER — LACTULOSE 10 GM/15ML PO SOLN: 10.0000 g | Freq: Three times a day (TID) | ORAL | 2 refills | Status: AC | PRN

## 2024-02-17 ENCOUNTER — Other Ambulatory Visit: Payer: Self-pay | Admitting: Family

## 2024-02-17 DIAGNOSIS — F419 Anxiety disorder, unspecified: Secondary | ICD-10-CM

## 2024-02-19 NOTE — Telephone Encounter (Signed)
 Complete. Schedule appointment. Last office visit 07/20/2023.

## 2024-02-22 ENCOUNTER — Telehealth: Payer: Self-pay | Admitting: Gastroenterology

## 2024-02-22 MED ORDER — NADOLOL 20 MG PO TABS: 20.0000 mg | ORAL_TABLET | Freq: Every day | ORAL | 3 refills | Status: AC

## 2024-02-22 NOTE — Telephone Encounter (Signed)
 Pt scheduled

## 2024-02-22 NOTE — Telephone Encounter (Signed)
 Patient requesting refill for nadolol  medication. Please advise, thank you

## 2024-03-30 ENCOUNTER — Ambulatory Visit: Admitting: Family

## 2024-03-30 ENCOUNTER — Encounter: Payer: Self-pay | Admitting: Family

## 2024-03-30 VITALS — BP 125/78 | HR 65 | Temp 98.4°F | Resp 16 | Ht 67.0 in | Wt 138.4 lb

## 2024-03-30 DIAGNOSIS — F419 Anxiety disorder, unspecified: Secondary | ICD-10-CM

## 2024-03-30 DIAGNOSIS — R590 Localized enlarged lymph nodes: Secondary | ICD-10-CM

## 2024-03-30 MED ORDER — SERTRALINE HCL 100 MG PO TABS: 100.0000 mg | ORAL_TABLET | Freq: Every day | ORAL | 0 refills | Status: AC

## 2024-03-30 NOTE — Progress Notes (Signed)
Follow up, medication refill

## 2024-03-30 NOTE — Progress Notes (Signed)
 Patient ID: [name], male    DOB: 05-01-1980  MRN: [id number]  CC: Chronic Conditions Follow-Up  Subjective: [name] is a 43 y.o. male who presents for chronic conditions follow-up.  His concerns today include:  - Doing well on Sertraline , no issues/concerns. He denies thoughts of self-harm, suicidal ideations, homicidal ideations. - States he noticed an enlarged lymph of left armpit a couple weeks ago while scratching. Denies red flag symptoms.   Patient Active Problem List   Diagnosis Date Noted   Esophageal varices without bleeding (HCC) 07/15/2022   Portal hypertensive gastropathy (HCC)    GIB (gastrointestinal bleeding) 12/30/2021   Hyponatremia 12/30/2021   Elevated LFTs 12/30/2021   GERD (gastroesophageal reflux disease)    Alcoholic hepatitis (HCC) 05/16/2020   Anemia 05/16/2020   Encephalopathy, portal systemic (HCC) 05/16/2020   Anxiety 05/16/2020   Gynecomastia 05/16/2020   Hyperprolactinemia 05/16/2020   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/16/2020   Esophageal varices with bleeding (HCC) 05/16/2020   Encephalopathy 05/16/2020   GI bleed 10/09/2019   Hematemesis 10/09/2019   Leukocytosis 10/09/2019   Elevated liver enzymes 10/09/2019   Elevated lactic acid level 09/02/2019   Bleeding esophageal varices (HCC) 09/02/2019   Shock (HCC) 08/30/2019   Electrolyte disturbance 07/16/2019   Lactic acidosis 07/16/2019   Hyperammonemia 07/16/2019   Hyperbilirubinemia 07/16/2019   Alcoholic cirrhosis of liver without ascites (HCC) 07/24/2016   Acute encephalopathy    Acute upper GI bleed    Ascites    Distended abdomen    Hemorrhagic shock (HCC)    Encounter for palliative care    Goals of care, counseling/discussion    Encephalopathy acute 08/27/2015   Alcoholic cirrhosis (HCC)    Acute hypoxemic respiratory failure (HCC)    SBP (spontaneous bacterial peritonitis) (HCC)    Hypocalcemia 08/21/2015   Hypomagnesemia 08/21/2015   Acute blood  loss anemia 08/20/2015   Alcohol abuse 08/20/2015   Elevated INR 08/20/2015   Hypoalbuminemia 08/20/2015   Hypokalemia 08/20/2015     Current Outpatient Medications on File Prior to Visit  Medication Sig Dispense Refill   Ferrous Sulfate  (IRON ) 325 (65 Fe) MG TABS Take 1 tablet (325 mg total) by mouth daily. (Patient taking differently: Take 325 mg by mouth daily with breakfast.) 20 tablet 0   folic acid  (FOLVITE ) 400 MCG tablet Take 400 mcg by mouth daily.     hydrocortisone  cream 1 % Apply 1 application topically daily as needed for itching.     nadolol  (CORGARD ) 20 MG tablet Take 1 tablet (20 mg total) by mouth daily. 30 tablet 3   oxymetazoline (AFRIN) 0.05 % nasal spray Place 1 spray into both nostrils 2 (two) times daily as needed for congestion.     pantoprazole  (PROTONIX ) 40 MG tablet Take 1 tablet (40 mg total) by mouth 2 (two) times daily before a meal. 60 tablet 5   thiamine  (VITAMIN B-1) 100 MG tablet Take 100 mg by mouth daily.     hydrOXYzine  (VISTARIL ) 25 MG capsule TAKE 1 CAPSULE(25 MG) BY MOUTH EVERY 8 HOURS AS NEEDED (Patient not taking: Reported on 08/05/2023) 30 capsule 2   lactulose  (CHRONULAC ) 10 GM/15ML solution Take 15 mLs (10 g total) by mouth 3 (three) times daily as needed for mild constipation. 1350 mL 2   Magnesium  500 MG TABS Take 500 mg by mouth in the morning and at bedtime.     Multiple Vitamin (MULTIVITAMIN WITH MINERALS) TABS tablet Take 1 tablet by mouth daily. (Patient taking  differently: Take 1 tablet by mouth daily with breakfast. Men)     No current facility-administered medications on file prior to visit.    Allergies  Allergen Reactions   Penicillins Hives and Other (See Comments)    Occurred when he was an infant    Social History   Socioeconomic History   Marital status: Single    Spouse name: Not on file   Number of children: 0   Years of education: Not on file   Highest education level: Some college, no degree  Occupational History    Not on file  Tobacco Use   Smoking status: Former    Current packs/day: 0.00    Average packs/day: 1 pack/day for 5.0 years (5.0 ttl pk-yrs)    Types: Cigarettes    Quit date: 07/27/2013    Years since quitting: 10.6   Smokeless tobacco: Never  Vaping Use   Vaping status: Never Used  Substance and Sexual Activity   Alcohol use: Not Currently    Alcohol/week: 3.0 standard drinks of alcohol    Comment: Alcoholism   Drug use: Not Currently    Frequency: 7.0 times per week    Types: Marijuana   Sexual activity: Not Currently    Birth control/protection: Abstinence, Condom  Other Topics Concern   Not on file  Social History Narrative   Not on file   Social Drivers of Health   Financial Resource Strain: Medium Risk (03/28/2024)   Overall Financial Resource Strain (CARDIA)    Difficulty of Paying Living Expenses: Somewhat hard  Food Insecurity: No Food Insecurity (03/28/2024)   Hunger Vital Sign    Worried About Running Out of Food in the Last Year: Never true    Ran Out of Food in the Last Year: Never true  Transportation Needs: No Transportation Needs (03/28/2024)   PRAPARE - Administrator, Civil Service (Medical): No    Lack of Transportation (Non-Medical): No  Physical Activity: Sufficiently Active (03/28/2024)   Exercise Vital Sign    Days of Exercise per Week: 7 days    Minutes of Exercise per Session: 30 min  Stress: No Stress Concern Present (03/28/2024)   [employer] of Occupational Health - Occupational Stress Questionnaire    Feeling of Stress: Not at all  Social Connections: Socially Isolated (03/28/2024)   Social Connection and Isolation Panel    Frequency of Communication with Friends and Family: Once a week    Frequency of Social Gatherings with Friends and Family: Once a week    Attends Religious Services: Never    Database Administrator or Organizations: No    Attends Engineer, Structural: Not on file    Marital Status: Never married   Intimate Partner Violence: Not At Risk (07/23/2023)   Humiliation, Afraid, Rape, and Kick questionnaire    Fear of Current or Ex-Partner: No    Emotionally Abused: No    Physically Abused: No    Sexually Abused: No    Family History  Problem Relation Age of Onset   Other Mother        pre-diabetes   Breast cancer Mother    Cancer Mother    Diabetes Mother    Obesity Mother    COPD Father        smoker   Meniere's disease Father    Depression Father    Hearing loss Father    Stroke Maternal Grandmother    Heart attack Maternal Grandfather    Stroke  Paternal Grandfather    Crohn's disease Maternal Uncle    Heart attack Maternal Uncle    Peptic Ulcer Paternal Aunt     Past Surgical History:  Procedure Laterality Date   ESOPHAGEAL BANDING  08/30/2019   Procedure: ESOPHAGEAL BANDING;  Surgeon: Saintclair Jasper, MD;  Location: [facility] ENDOSCOPY;  Service: Gastroenterology;;   ESOPHAGEAL BANDING  10/10/2019   Procedure: ESOPHAGEAL BANDING;  Surgeon: Saintclair Jasper, MD;  Location: [facility] ENDOSCOPY;  Service: Gastroenterology;;   ESOPHAGEAL BANDING N/A 12/23/2019   Procedure: ESOPHAGEAL BANDING;  Surgeon: Elicia Claw, MD;  Location: [facility] ENDOSCOPY;  Service: Gastroenterology;  Laterality: N/A;   ESOPHAGEAL BANDING N/A 01/12/2020   Procedure: ESOPHAGEAL BANDING;  Surgeon: Elicia Claw, MD;  Location: [facility] ENDOSCOPY;  Service: Gastroenterology;  Laterality: N/A;   ESOPHAGEAL BANDING N/A 09/10/2020   Procedure: ESOPHAGEAL BANDING;  Surgeon: Leigh Elspeth SQUIBB, MD;  Location: [facility] ENDOSCOPY;  Service: Gastroenterology;  Laterality: N/A;   ESOPHAGEAL BANDING  04/16/2021   Procedure: ESOPHAGEAL BANDING;  Surgeon: Federico Rosario BROCKS, MD;  Location: [facility]COPY;  Service: Gastroenterology;;   ESOPHAGEAL BANDING N/A 05/21/2021   Procedure: ESOPHAGEAL BANDING;  Surgeon: Teressa Toribio SQUIBB, MD;  Location: [facility] ENDOSCOPY;  Service: Endoscopy;  Laterality: N/A;   ESOPHAGOGASTRODUODENOSCOPY Left 08/21/2015    Procedure: ESOPHAGOGASTRODUODENOSCOPY (EGD);  Surgeon: Jerrell Sol, MD;  Location: [facility]COPY;  Service: Endoscopy;  Laterality: Left;   ESOPHAGOGASTRODUODENOSCOPY N/A 12/05/2019   Procedure: ESOPHAGOGASTRODUODENOSCOPY (EGD);  Surgeon: Elicia Claw, MD;  Location: [facility] ENDOSCOPY;  Service: Gastroenterology;  Laterality: N/A;   ESOPHAGOGASTRODUODENOSCOPY (EGD) WITH PROPOFOL  N/A 08/30/2019   Procedure: ESOPHAGOGASTRODUODENOSCOPY (EGD) WITH PROPOFOL ;  Surgeon: Saintclair Jasper, MD;  Location: [facility] ENDOSCOPY;  Service: Gastroenterology;  Laterality: N/A;   ESOPHAGOGASTRODUODENOSCOPY (EGD) WITH PROPOFOL  N/A 10/10/2019   Procedure: ESOPHAGOGASTRODUODENOSCOPY (EGD) WITH PROPOFOL ;  Surgeon: Saintclair Jasper, MD;  Location: [facility] ENDOSCOPY;  Service: Gastroenterology;  Laterality: N/A;   ESOPHAGOGASTRODUODENOSCOPY (EGD) WITH PROPOFOL  N/A 12/23/2019   Procedure: ESOPHAGOGASTRODUODENOSCOPY (EGD) WITH PROPOFOL ;  Surgeon: Elicia Claw, MD;  Location: [facility] ENDOSCOPY;  Service: Gastroenterology;  Laterality: N/A;   ESOPHAGOGASTRODUODENOSCOPY (EGD) WITH PROPOFOL  N/A 01/12/2020   Procedure: ESOPHAGOGASTRODUODENOSCOPY (EGD) WITH PROPOFOL ;  Surgeon: Elicia Claw, MD;  Location: [facility] ENDOSCOPY;  Service: Gastroenterology;  Laterality: N/A;   ESOPHAGOGASTRODUODENOSCOPY (EGD) WITH PROPOFOL  N/A 09/10/2020   Procedure: ESOPHAGOGASTRODUODENOSCOPY (EGD) WITH PROPOFOL ;  Surgeon: Leigh Elspeth SQUIBB, MD;  Location: [facility] ENDOSCOPY;  Service: Gastroenterology;  Laterality: N/A;   ESOPHAGOGASTRODUODENOSCOPY (EGD) WITH PROPOFOL  N/A 04/16/2021   Procedure: ESOPHAGOGASTRODUODENOSCOPY (EGD) WITH PROPOFOL ;  Surgeon: Federico Rosario BROCKS, MD;  Location: [facility] ENDOSCOPY;  Service: Gastroenterology;  Laterality: N/A;   ESOPHAGOGASTRODUODENOSCOPY (EGD) WITH PROPOFOL  N/A 05/21/2021   Procedure: ESOPHAGOGASTRODUODENOSCOPY (EGD) WITH PROPOFOL ;  Surgeon: Teressa Toribio SQUIBB, MD;  Location: [facility] ENDOSCOPY;  Service: Endoscopy;  Laterality: N/A;    ESOPHAGOGASTRODUODENOSCOPY (EGD) WITH PROPOFOL  N/A 08/01/2021   Procedure: ESOPHAGOGASTRODUODENOSCOPY (EGD) WITH PROPOFOL ;  Surgeon: Leigh Elspeth SQUIBB, MD;  Location: [facility] ENDOSCOPY;  Service: Gastroenterology;  Laterality: N/A;   ESOPHAGOGASTRODUODENOSCOPY (EGD) WITH PROPOFOL  N/A 01/01/2022   Procedure: ESOPHAGOGASTRODUODENOSCOPY (EGD) WITH PROPOFOL ;  Surgeon: Aneita Gwendlyn DASEN, MD;  Location: [facility] ENDOSCOPY;  Service: Gastroenterology;  Laterality: N/A;   ESOPHAGOGASTRODUODENOSCOPY (EGD) WITH PROPOFOL  N/A 07/15/2022   Procedure: ESOPHAGOGASTRODUODENOSCOPY (EGD) WITH PROPOFOL ;  Surgeon: Leigh Elspeth SQUIBB, MD;  Location: [facility] ENDOSCOPY;  Service: Gastroenterology;  Laterality: N/A;   GASTRIC VARICES BANDING  12/05/2019   Procedure: GASTRIC VARICES BANDING;  Surgeon: Elicia Claw, MD;  Location: [facility] ENDOSCOPY;  Service: Gastroenterology;;  placed 6 bands   HOT  HEMOSTASIS N/A 12/05/2019   Procedure: HOT HEMOSTASIS (ARGON PLASMA COAGULATION/BICAP);  Surgeon: Elicia Claw, MD;  Location: [facility] ENDOSCOPY;  Service: Gastroenterology;  Laterality: N/A;   IR RADIOLOGIST EVAL & MGMT  12/13/2019    ROS: Review of Systems Negative except as stated above  PHYSICAL EXAM: BP 125/78   Pulse 65   Temp 98.4 F (36.9 C) (Oral)   Resp 16   Ht 5' 7 (1.702 m)   Wt 138 lb 6.4 oz (62.8 kg)   SpO2 96%   BMI 21.68 kg/m   Physical Exam HENT:     Head: Normocephalic and atraumatic.     Nose: Nose normal.     Mouth/Throat:     Mouth: Mucous membranes are moist.     Pharynx: Oropharynx is clear.  Eyes:     Extraocular Movements: Extraocular movements intact.     Conjunctiva/sclera: Conjunctivae normal.     Pupils: Pupils are equal, round, and reactive to light.  Cardiovascular:     Rate and Rhythm: Normal rate and regular rhythm.     Pulses: Normal pulses.     Heart sounds: Normal heart sounds.  Pulmonary:     Effort: Pulmonary effort is normal.     Breath sounds: Normal breath sounds.   Musculoskeletal:        General: Normal range of motion.     Cervical back: Normal range of motion and neck supple.  Skin:    General: Skin is warm and dry.     Comments: Firm nodule left armpit.  Neurological:     General: No focal deficit present.     Mental Status: He is alert and oriented to person, place, and time.  Psychiatric:        Mood and Affect: Mood normal.        Behavior: Behavior normal.     ASSESSMENT AND PLAN: 1. Anxiety and depression (Primary) - Patient denies thoughts of self-harm, suicidal ideations, homicidal ideations. - Continue Sertraline  as prescribed. Counseled on medication adherence/adverse effects.  - Follow-up with primary provider in 3 months or sooner if needed.  - sertraline  (ZOLOFT ) 100 MG tablet; Take 1 tablet (100 mg total) by mouth daily.  Dispense: 90 tablet; Refill: 0  2. Enlarged lymph nodes in armpit - Patient declined referral to specialist.  - Follow-up with primary provider as scheduled.    Patient was given the opportunity to ask questions.  Patient verbalized understanding of the plan and was able to repeat key elements of the plan. Patient was given clear instructions to go to Emergency Department or return to medical center if symptoms don't improve, worsen, or new problems develop.The patient verbalized understanding.   Requested Prescriptions   Signed Prescriptions Disp Refills   sertraline  (ZOLOFT ) 100 MG tablet 90 tablet 0    Sig: Take 1 tablet (100 mg total) by mouth daily.    Return in about 3 months (around 06/28/2024) for Follow-Up or next available chronic conditions.  Greig JINNY Chute, NP

## 2024-05-17 ENCOUNTER — Other Ambulatory Visit: Payer: Self-pay | Admitting: Family

## 2024-05-17 DIAGNOSIS — F419 Anxiety disorder, unspecified: Secondary | ICD-10-CM

## 2024-05-17 NOTE — Telephone Encounter (Signed)
 Requested Prescriptions  Refused Prescriptions Disp Refills   sertraline  (ZOLOFT ) 100 MG tablet [Pharmacy Med Name: SERTRALINE  100MG  TABLETS] 90 tablet 0    Sig: TAKE 1 TABLET(100 MG) BY MOUTH DAILY     Psychiatry:  Antidepressants - SSRI - sertraline  Passed - 05/17/2024  2:46 PM      Passed - AST in normal range and within 360 days    AST  Date Value Ref Range Status  07/20/2023 33 0 - 40 IU/L Final         Passed - ALT in normal range and within 360 days    ALT  Date Value Ref Range Status  07/20/2023 20 0 - 44 IU/L Final         Passed - Completed PHQ-2 or PHQ-9 in the last 360 days      Passed - Valid encounter within last 6 months    Recent Outpatient Visits           1 month ago Anxiety and depression   [facility], [place], [place]   10 months ago Preoperative clearance   [facility], [place], [place]   1 year ago Preoperative clearance   [facility], [place], [place]   1 year ago Anxiety and depression   [place] [facility], [place], [place]   2 years ago Anxiety and depression   [facility], [place], NP

## 2024-06-28 ENCOUNTER — Ambulatory Visit: Admitting: Family

## 2024-08-04 ENCOUNTER — Ambulatory Visit
